# Patient Record
Sex: Female | Born: 1940 | Race: Black or African American | Hispanic: No | Smoking: Never smoker
Health system: Southern US, Community
[De-identification: ages and names within clinical notes are randomized; demographics above are authoritative.]

## PROBLEM LIST (undated history)

## (undated) DIAGNOSIS — E7800 Pure hypercholesterolemia, unspecified: Secondary | ICD-10-CM

## (undated) DIAGNOSIS — E119 Type 2 diabetes mellitus without complications: Secondary | ICD-10-CM

## (undated) DIAGNOSIS — R0602 Shortness of breath: Secondary | ICD-10-CM

## (undated) DIAGNOSIS — I10 Essential (primary) hypertension: Secondary | ICD-10-CM

## (undated) DIAGNOSIS — M1990 Unspecified osteoarthritis, unspecified site: Secondary | ICD-10-CM

## (undated) DIAGNOSIS — H269 Unspecified cataract: Secondary | ICD-10-CM

## (undated) DIAGNOSIS — Z87442 Personal history of urinary calculi: Secondary | ICD-10-CM

## (undated) DIAGNOSIS — M67919 Unspecified disorder of synovium and tendon, unspecified shoulder: Secondary | ICD-10-CM

## (undated) DIAGNOSIS — K219 Gastro-esophageal reflux disease without esophagitis: Secondary | ICD-10-CM

## (undated) DIAGNOSIS — H409 Unspecified glaucoma: Secondary | ICD-10-CM

## (undated) DIAGNOSIS — R2243 Localized swelling, mass and lump, lower limb, bilateral: Secondary | ICD-10-CM

## (undated) DIAGNOSIS — R42 Dizziness and giddiness: Secondary | ICD-10-CM

## (undated) DIAGNOSIS — E039 Hypothyroidism, unspecified: Secondary | ICD-10-CM

## (undated) DIAGNOSIS — R079 Chest pain, unspecified: Secondary | ICD-10-CM

## (undated) DIAGNOSIS — K589 Irritable bowel syndrome without diarrhea: Secondary | ICD-10-CM

## (undated) DIAGNOSIS — R609 Edema, unspecified: Secondary | ICD-10-CM

## (undated) HISTORY — PX: JOINT REPLACEMENT: SHX530

## (undated) HISTORY — DX: Type 2 diabetes mellitus without complications: E11.9

## (undated) HISTORY — DX: Unspecified cataract: H26.9

## (undated) HISTORY — DX: Shortness of breath: R06.02

## (undated) HISTORY — DX: Unspecified glaucoma: H40.9

## (undated) HISTORY — DX: Chest pain, unspecified: R07.9

---

## 1973-07-09 HISTORY — PX: HEMORRHOID SURGERY: SHX153

## 1973-07-09 HISTORY — PX: CHOLECYSTECTOMY: SHX55

## 1983-07-10 HISTORY — PX: BREAST SURGERY: SHX581

## 1983-07-10 HISTORY — PX: ABDOMINAL HYSTERECTOMY: SHX81

## 1993-07-09 HISTORY — PX: EYE SURGERY: SHX253

## 1999-07-07 ENCOUNTER — Ambulatory Visit (HOSPITAL_BASED_OUTPATIENT_CLINIC_OR_DEPARTMENT_OTHER): Admission: RE | Admit: 1999-07-07 | Discharge: 1999-07-07 | Payer: Self-pay | Admitting: Urology

## 1999-07-20 ENCOUNTER — Ambulatory Visit (HOSPITAL_COMMUNITY): Admission: RE | Admit: 1999-07-20 | Discharge: 1999-07-20 | Payer: Self-pay | Admitting: *Deleted

## 1999-07-20 ENCOUNTER — Encounter: Payer: Self-pay | Admitting: *Deleted

## 1999-10-27 ENCOUNTER — Encounter (INDEPENDENT_AMBULATORY_CARE_PROVIDER_SITE_OTHER): Payer: Self-pay | Admitting: Specialist

## 1999-10-27 ENCOUNTER — Ambulatory Visit (HOSPITAL_COMMUNITY): Admission: RE | Admit: 1999-10-27 | Discharge: 1999-10-27 | Payer: Self-pay | Admitting: *Deleted

## 2000-01-26 ENCOUNTER — Ambulatory Visit (HOSPITAL_COMMUNITY): Admission: RE | Admit: 2000-01-26 | Discharge: 2000-01-26 | Payer: Self-pay | Admitting: *Deleted

## 2000-01-31 ENCOUNTER — Other Ambulatory Visit: Admission: RE | Admit: 2000-01-31 | Discharge: 2000-01-31 | Payer: Self-pay | Admitting: Obstetrics

## 2000-12-20 ENCOUNTER — Encounter: Admission: RE | Admit: 2000-12-20 | Discharge: 2000-12-20 | Payer: Self-pay | Admitting: *Deleted

## 2000-12-20 ENCOUNTER — Encounter: Payer: Self-pay | Admitting: *Deleted

## 2002-07-24 ENCOUNTER — Encounter: Payer: Self-pay | Admitting: Ophthalmology

## 2002-07-30 ENCOUNTER — Ambulatory Visit (HOSPITAL_COMMUNITY): Admission: RE | Admit: 2002-07-30 | Discharge: 2002-07-30 | Payer: Self-pay | Admitting: Ophthalmology

## 2003-09-10 ENCOUNTER — Ambulatory Visit (HOSPITAL_COMMUNITY): Admission: RE | Admit: 2003-09-10 | Discharge: 2003-09-10 | Payer: Self-pay | Admitting: Internal Medicine

## 2003-09-17 ENCOUNTER — Ambulatory Visit (HOSPITAL_COMMUNITY): Admission: RE | Admit: 2003-09-17 | Discharge: 2003-09-17 | Payer: Self-pay | Admitting: Ophthalmology

## 2003-10-22 ENCOUNTER — Ambulatory Visit (HOSPITAL_COMMUNITY): Admission: RE | Admit: 2003-10-22 | Discharge: 2003-10-22 | Payer: Self-pay | Admitting: Internal Medicine

## 2003-11-08 ENCOUNTER — Ambulatory Visit (HOSPITAL_COMMUNITY): Admission: RE | Admit: 2003-11-08 | Discharge: 2003-11-08 | Payer: Self-pay | Admitting: Internal Medicine

## 2003-12-09 ENCOUNTER — Ambulatory Visit (HOSPITAL_COMMUNITY): Admission: RE | Admit: 2003-12-09 | Discharge: 2003-12-09 | Payer: Self-pay | Admitting: Internal Medicine

## 2004-11-09 ENCOUNTER — Encounter: Admission: RE | Admit: 2004-11-09 | Discharge: 2004-11-22 | Payer: Self-pay | Admitting: Internal Medicine

## 2005-08-16 ENCOUNTER — Ambulatory Visit (HOSPITAL_COMMUNITY): Admission: RE | Admit: 2005-08-16 | Discharge: 2005-08-16 | Payer: Self-pay | Admitting: Ophthalmology

## 2007-11-21 ENCOUNTER — Ambulatory Visit (HOSPITAL_COMMUNITY): Admission: RE | Admit: 2007-11-21 | Discharge: 2007-11-21 | Payer: Self-pay | Admitting: Internal Medicine

## 2009-02-17 ENCOUNTER — Encounter: Admission: RE | Admit: 2009-02-17 | Discharge: 2009-02-17 | Payer: Self-pay | Admitting: Specialist

## 2009-02-20 ENCOUNTER — Encounter: Admission: RE | Admit: 2009-02-20 | Discharge: 2009-02-20 | Payer: Self-pay | Admitting: Specialist

## 2010-07-30 ENCOUNTER — Encounter: Payer: Self-pay | Admitting: Internal Medicine

## 2010-11-24 NOTE — Op Note (Signed)
[name], [name]                  ACCOUNT NO.:  [id number]   MEDICAL RECORD NO.:  [id number]          PATIENT TYPE:  AMB   LOCATION:  SDS                          FACILITY:  [facility]   PHYSICIAN:  Salley Scarlet., M.D.DATE OF BIRTH:  [birth year]   DATE OF PROCEDURE:  08/16/2005  DATE OF DISCHARGE:  08/16/2005                                 OPERATIVE REPORT   PREOPERATIVE DIAGNOSIS:  Immature cataract, left eye.   POSTOPERATIVE DIAGNOSIS:  Immature cataract, left eye.   OPERATION PERFORMED:  Kelman phacoemulsification cataract, left eye with  intraocular lens implantation.   SURGEON:  Nadyne Coombes, M.D.   ANESTHESIA:  Local using Xylocaine 2% and Marcaine 0.75% and Wydase.   INDICATIONS FOR PROCEDURE:  The patient is a 70 year old lady who underwent  cataract extraction from the right eye several months ago.  She still  complains of blurring of vision with difficulty seeing to do work.  She was  evaluated and found to have a visual acuity best corrected at 20/70 on the  left, 20/25 on the right.  There was intraocular lens present in the right  eye but there was a 2+ nuclear sclerotic cataract on the left.  Cataract  extraction with intraocular lens implantation was recommended.  She was  admitted at this time for that purpose.   DESCRIPTION OF PROCEDURE:  Under the influence of intravenous sedation and  Van Lint akinesia, retrobulbar anesthesia was given.  The patient was  prepped and draped in the usual manner.  A lid speculum was inserted under  the upper and lower lid of the left eye and a 4-0 silk traction suture was  passed through the belly of the superior rectus muscle for traction.  A  fornix-based conjunctival flap was turned and hemostasis achieved using  cautery.  An incision was made in the sclera at the limbus.  This incision  was dissected down to clear cornea using crescent blade.  A side port  incision made at the 1:30 o'clock position.  The anterior  chamber was then  entered through the corneoscleral tunnel incision at the 11:30 o'clock  position.  Ocucoat was injected into the eye through the incision.  An  anterior capsulotomy was done using a bent 25 gauge needle.  The nucleus was  hydrodissected using Xylocaine.  The KPE handpiece was passed into the eye  and the nucleus was begun to be emulsified.  During the procedure the pupil  became quite miotic and it was subsequently noted that a tear was obtained  in the posterior capsule.  The eye was then opened by extending the  corneoscleral wound first toward the left, then toward the right, placing a  single 8-0 Vicryl suture across each arm of the incision as it was made  toward the left then toward the right.  The remaining nucleus was then  manually expressed from the eye.  An anterior vitrector was then good using  the vitrector.  The residual cortical material was then aspirated using the  IA handpiece.  The anterior chamber was then filled with Ocucoat and  the  pupil was constricted using Miochol.  An anterior chamber lens was then  seated across the iris into the chamber angle without difficulty.  A  peripheral iridectomy was made in the iris.  The corneoscleral wound was  closed using a combination of interrupted sutures of 8-0 Vicryl and 10-0  nylon.  After it was ascertained that the wound was airtight and watertight,  the conjunctiva was closed using thermal cautery.  1 mL of Celestone and 0.5  mL of gentamicin were injected subconjunctivally.  Maxitrol ophthalmic  ointment and atropine ointment were applied along with a patch and Fox  shield. The patient tolerated the procedure well and was discharged to the  post  anesthesia recovery room in satisfactory condition.  The patient was  instructed to rest today, to take Darvocet-N every four hours as needed for  pain and to see me in the office tomorrow for further evaluation.   DISCHARGE DIAGNOSIS:  Immature cataract, left  eye.      Salley Scarlet., M.D.  Electronically Signed     TB/MEDQ  D:  08/16/2005  T:  08/17/2005  Job:  [id number]

## 2012-12-02 ENCOUNTER — Other Ambulatory Visit: Payer: Self-pay | Admitting: Orthopedic Surgery

## 2012-12-15 NOTE — Pre-Procedure Instructions (Signed)
Please enter orders in EPIC as patient has pre-op appointment on 12/18/2012 at 11 am! Thank you!

## 2012-12-17 ENCOUNTER — Encounter (HOSPITAL_COMMUNITY): Payer: Self-pay | Admitting: Pharmacy Technician

## 2012-12-17 ENCOUNTER — Other Ambulatory Visit: Payer: Self-pay | Admitting: Orthopedic Surgery

## 2012-12-17 MED ORDER — DEXAMETHASONE SODIUM PHOSPHATE 10 MG/ML IJ SOLN: 10.0000 mg | Freq: Once | INTRAMUSCULAR | Status: DC

## 2012-12-17 MED ORDER — BUPIVACAINE LIPOSOME 1.3 % IJ SUSP: 20.0000 mL | Freq: Once | INTRAMUSCULAR | Status: DC

## 2012-12-17 NOTE — Progress Notes (Signed)
Preoperative surgical orders have been place into the Epic hospital system for [name] on 12/17/2012, 10:11 PM  by Patrica Duel for surgery on 12/29/2012.  Preop Total Knee orders including Experal, IV Tylenol, and IV Decadron as long as there are no contraindications to the above medications. Avel Peace, PA-C

## 2012-12-18 ENCOUNTER — Ambulatory Visit (HOSPITAL_COMMUNITY)
Admission: RE | Admit: 2012-12-18 | Discharge: 2012-12-18 | Disposition: A | Discharge status: Home / Self Care | Payer: Medicare Other | Source: Ambulatory Visit | Attending: Orthopedic Surgery | Admitting: Orthopedic Surgery

## 2012-12-18 ENCOUNTER — Encounter (HOSPITAL_COMMUNITY)
Admission: RE | Admit: 2012-12-18 | Discharge: 2012-12-18 | Disposition: A | Discharge status: Home / Self Care | Payer: Medicare Other | Source: Ambulatory Visit | Attending: Orthopedic Surgery | Admitting: Orthopedic Surgery

## 2012-12-18 ENCOUNTER — Encounter (HOSPITAL_COMMUNITY): Payer: Self-pay

## 2012-12-18 DIAGNOSIS — Z01818 Encounter for other preprocedural examination: Secondary | ICD-10-CM | POA: Insufficient documentation

## 2012-12-18 DIAGNOSIS — Z01812 Encounter for preprocedural laboratory examination: Secondary | ICD-10-CM | POA: Insufficient documentation

## 2012-12-18 DIAGNOSIS — I10 Essential (primary) hypertension: Secondary | ICD-10-CM | POA: Insufficient documentation

## 2012-12-18 HISTORY — DX: Essential (primary) hypertension: I10

## 2012-12-18 HISTORY — DX: Unspecified osteoarthritis, unspecified site: M19.90

## 2012-12-18 HISTORY — DX: Dizziness and giddiness: R42

## 2012-12-18 HISTORY — DX: Hypothyroidism, unspecified: E03.9

## 2012-12-18 HISTORY — DX: Gastro-esophageal reflux disease without esophagitis: K21.9

## 2012-12-18 HISTORY — DX: Pure hypercholesterolemia, unspecified: E78.00

## 2012-12-18 HISTORY — DX: Edema, unspecified: R60.9

## 2012-12-18 HISTORY — DX: Irritable bowel syndrome, unspecified: K58.9

## 2012-12-18 HISTORY — DX: Unspecified disorder of synovium and tendon, unspecified shoulder: M67.919

## 2012-12-18 LAB — COMPREHENSIVE METABOLIC PANEL
ALT: 22 U/L (ref 0–35)
AST: 19 U/L (ref 0–37)
Calcium: 10.9 mg/dL — ABNORMAL HIGH (ref 8.4–10.5)
Creatinine, Ser: 0.61 mg/dL (ref 0.50–1.10)
GFR calc Af Amer: >90 mL/min (ref >90)
Sodium: 136 mEq/L (ref 135–145)
Total Protein: 6.4 g/dL (ref 6.0–8.3)

## 2012-12-18 LAB — PROTIME-INR: INR: 0.93 (ref 0.00–1.49)

## 2012-12-18 LAB — CBC
MCH: 30 pg (ref 26.0–34.0)
MCHC: 34.1 g/dL (ref 30.0–36.0)
Platelets: 170 10*3/uL (ref 150–400)

## 2012-12-18 LAB — URINALYSIS, ROUTINE W REFLEX MICROSCOPIC
Glucose, UA: NEGATIVE mg/dL
Leukocytes, UA: NEGATIVE
Nitrite: NEGATIVE
Protein, ur: NEGATIVE mg/dL
pH: 5.5 (ref 5.0–8.0)

## 2012-12-18 LAB — SURGICAL PCR SCREEN: Staphylococcus aureus: NEGATIVE

## 2012-12-18 NOTE — Pre-Procedure Instructions (Signed)
EKG REPORT 06/26/12 ON PT'S CHART FROM DR. PChestine Spore AND NOTE OF MEDICAL CLEARANCE. CXR WAS DONE TODAY AT [facility].

## 2012-12-18 NOTE — Patient Instructions (Signed)
YOUR SURGERY IS SCHEDULED AT [facility]  ON:  Monday  June 23  REPORT TO [place] SHORT STAY CENTER AT:  6:25 AM      PHONE # FOR SHORT STAY IS [phone]  DO NOT EAT OR DRINK ANYTHING AFTER MIDNIGHT THE NIGHT BEFORE YOUR SURGERY.  YOU MAY BRUSH YOUR TEETH, RINSE OUT YOUR MOUTH--BUT NO WATER, NO FOOD, NO CHEWING GUM, NO MINTS, NO CANDIES, NO CHEWING TOBACCO.  PLEASE TAKE THE FOLLOWING MEDICATIONS THE AM OF YOUR SURGERY WITH A FEW SIPS OF WATER:    SYNTHROID, PROTONIX   DO NOT BRING VALUABLES, MONEY, CREDIT CARDS.  DO NOT WEAR JEWELRY, MAKE-UP, NAIL POLISH AND NO METAL PINS OR CLIPS IN YOUR HAIR. CONTACT LENS, DENTURES / PARTIALS, GLASSES SHOULD NOT BE WORN TO SURGERY AND IN MOST CASES-HEARING AIDS WILL NEED TO BE REMOVED.  BRING YOUR GLASSES CASE, ANY EQUIPMENT NEEDED FOR YOUR CONTACT LENS. FOR PATIENTS ADMITTED TO THE HOSPITAL--CHECK OUT TIME THE DAY OF DISCHARGE IS 11:00 AM.  ALL INPATIENT ROOMS ARE PRIVATE - WITH BATHROOM, TELEPHONE, TELEVISION AND WIFI INTERNET.                                 PLEASE READ OVER ANY  FACT SHEETS THAT YOU WERE GIVEN: MRSA INFORMATION, BLOOD TRANSFUSION INFORMATION, INCENTIVE SPIROMETER INFORMATION. FAILURE TO FOLLOW THESE INSTRUCTIONS MAY RESULT IN THE CANCELLATION OF YOUR SURGERY.   PATIENT SIGNATURE_________________________________

## 2012-12-28 ENCOUNTER — Other Ambulatory Visit: Payer: Self-pay | Admitting: Orthopedic Surgery

## 2012-12-28 NOTE — H&P (Signed)
[name]  DOB: 05/24/1941 Married / Language: Undefined / Race: Black or African American Female  Date of Admission:  12/29/2012  Chief Complaint:  Right Knee Pain  History of Present Illness The patient is a 71 year old female who comes in for a preoperative History and Physical. The patient is scheduled for a right total knee arthroplasty to be performed by Dr. Frank V. Aluisio, MD at [facility] on 12/29/2012. The patient is a 71 year old female who presents for follow up of their knee. The patient is being followed for their right knee pain and osteoarthritis. They are now out from Synvisc series. Symptoms reported today include: stiffness, pain with weightbearing, difficulty ambulating and difficulty arising from chair. The patient feels that they are doing poorly and report their pain level to be moderate. The patient has not gotten any relief of their symptoms with viscosupplementation. Unfortunately, the knee is getting progressively worse over time. It hurts her at all times. It is limiting what she can and cannot do. The Synvisc has not helped anymore. She feels like the knee is essentially taking over her life and she is at a stage where she feels like she needs to do something. She is not having associated hip pain with this. She is ready to get the knee fixed at this time. They have been treated conservatively in the past for the above stated problem and despite conservative measures, they continue to have progressive pain and severe functional limitations and dysfunction. They have failed non-operative management including home exercise, medications, and injections. It is felt that they would benefit from undergoing total joint replacement. Risks and benefits of the procedure have been discussed with the patient and they elect to proceed with surgery. There are no active contraindications to surgery such as ongoing infection or rapidly progressive neurological  disease.     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 Rash. Codeine/Codeine Derivatives. Nausea.   Family History  Cancer. sister and brother Diabetes Mellitus. mother and sister Hypertension. mother   Social History Exercise. Exercises daily; does other Illicit drug use. no Living situation. live alone Drug/Alcohol Rehab (Previously). no Children. 4 Current work status. retired Drug/Alcohol Rehab (Currently). no Marital status. married Tobacco use. Former smoker. never smoker Number of flights of stairs before winded. 2-3 Pain Contract. no Tobacco / smoke exposure. yes Alcohol use. Occasional alcohol use. current drinker; drinks wine and hard liquor; only occasionally per week   Medication History  Synthroid (75MCG Tablet, Oral) Active. Furosemide (20MG Tablet, Oral) Active. Crestor (10MG Tablet, Oral) Active. Benicar ( Oral) Specific dose unknown - Active. Protonix (40MG Tablet DR, Oral) Active. Centrum Silver ( Oral) Active. Aspirin Adult Low Strength (81MG Tablet DR, Oral) Active. MiraLax ( Oral) Active. Meclizine HCl (25MG Tablet, Oral) Active.   Past Surgical History  Hysterectomy. partial (non-cancerous) Tonsillectomy Hemorrhoidectomy Breast Biopsy. right Cataract Surgery. bilateral Gallbladder Surgery. open    Medical History Hypothyroidism Irritable bowel syndrome Osteoarthritis Hypercholesterolemia Gastroesophageal Reflux Disease High blood pressure Vertigo Lymphadema. right arm   Review of Systems  General:Not Present- Chills, Fever, Night Sweats, Fatigue, Weight Gain, Weight Loss and Memory Loss. Skin:Not Present- Hives, Itching, Rash, Eczema and Lesions. HEENT:Not Present- Tinnitus, Headache, Double Vision, Visual Loss, Hearing Loss and Dentures. Respiratory:Not Present- Shortness of breath with exertion, Shortness of breath at  rest, Allergies, Coughing up blood and Chronic Cough. Cardiovascular:Not Present- Chest Pain, Racing/skipping heartbeats, Difficulty Breathing Lying Down,   Murmur, Swelling and Palpitations. Gastrointestinal:Not Present- Bloody Stool, Heartburn, Abdominal Pain, Vomiting, Nausea, Constipation, Diarrhea, Difficulty Swallowing, Jaundice and Loss of appetitie. Female Genitourinary:Not Present- Blood in Urine, Urinary frequency, Weak urinary stream, Discharge, Flank Pain, Incontinence, Painful Urination, Urgency, Urinary Retention and Urinating at Night. Musculoskeletal:Not Present- Muscle Weakness, Muscle Pain, Joint Swelling, Joint Pain, Back Pain, Morning Stiffness and Spasms. Neurological:Not Present- Tremor, Dizziness, Blackout spells, Paralysis, Difficulty with balance and Weakness. Psychiatric:Not Present- Insomnia.   Vitals  Pulse: 64 (Regular) Resp.: 14 (Unlabored) BP: 128/62 (Sitting, Right Arm, Standard)    Physical Exam  The physical exam findings are as follows:   General Mental Status - Alert, cooperative and good historian. General Appearance- pleasant. Not in acute distress. Orientation- Oriented X3. Build & Nutrition- Well nourished and Well developed.   Head and Neck Head- normocephalic, atraumatic . Neck Global Assessment- supple. no bruit auscultated on the right and no bruit auscultated on the left.   Eye Pupil- Bilateral- Regular and Round. Motion- Bilateral- EOMI.   Chest and Lung Exam Auscultation: Breath sounds:- clear at anterior chest wall and - clear at posterior chest wall. Adventitious sounds:- No Adventitious sounds.   Cardiovascular Auscultation:Rhythm- Regular rate and rhythm. Heart Sounds- S1 WNL and S2 WNL. Murmurs & Other Heart Sounds:Auscultation of the heart reveals - No Murmurs.   Abdomen Inspection:Contour- Generalized mild distention. Palpation/Percussion:Tenderness- Abdomen is non-tender to  palpation. Rigidity (guarding)- Abdomen is soft. Auscultation:Auscultation of the abdomen reveals - Bowel sounds normal.   Female Genitourinary  Not done, not pertinent to present illness  Musculoskeletal  On exam well developed female, alert and oriented in no apparent distress. Her hip shows normal range of motion with no discomfort. Her left knee, no effusion, range about 5 to 125. Slight crepitus on range of motion. No tenderness or instability. The right knee no effusion. Range about 10 to 120. Marked crepitus on range of motion. Tender medial greater than lateral with no instability noted. Pulse sensation motor intact.  RADIOGRAPHS: She is bone on bone medial and patellofemoral.  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 Aluisio.  Plan is to go to rehab at [facility] at [place].  Signed electronically by Gilmore List L Coti Burd, III PA-C  

## 2012-12-29 ENCOUNTER — Encounter (HOSPITAL_COMMUNITY)
Admission: RE | Disposition: A | Discharge status: Skilled Nursing Facility | Payer: Self-pay | Source: Ambulatory Visit | Attending: Orthopedic Surgery

## 2012-12-29 ENCOUNTER — Encounter (HOSPITAL_COMMUNITY): Payer: Self-pay | Admitting: Anesthesiology

## 2012-12-29 ENCOUNTER — Inpatient Hospital Stay (HOSPITAL_COMMUNITY)
Admission: RE | Admit: 2012-12-29 | Discharge: 2013-01-01 | DRG: 470 | Disposition: A | Discharge status: Skilled Nursing Facility | Payer: Medicare Other | Source: Ambulatory Visit | Attending: Orthopedic Surgery | Admitting: Orthopedic Surgery

## 2012-12-29 ENCOUNTER — Encounter (HOSPITAL_COMMUNITY): Payer: Self-pay | Admitting: *Deleted

## 2012-12-29 ENCOUNTER — Inpatient Hospital Stay (HOSPITAL_COMMUNITY): Payer: Medicare Other | Admitting: Anesthesiology

## 2012-12-29 DIAGNOSIS — E039 Hypothyroidism, unspecified: Secondary | ICD-10-CM | POA: Diagnosis present

## 2012-12-29 DIAGNOSIS — D62 Acute posthemorrhagic anemia: Secondary | ICD-10-CM

## 2012-12-29 DIAGNOSIS — E7800 Pure hypercholesterolemia, unspecified: Secondary | ICD-10-CM | POA: Diagnosis present

## 2012-12-29 DIAGNOSIS — I10 Essential (primary) hypertension: Secondary | ICD-10-CM | POA: Diagnosis present

## 2012-12-29 DIAGNOSIS — Z6838 Body mass index (BMI) 38.0-38.9, adult: Secondary | ICD-10-CM

## 2012-12-29 DIAGNOSIS — M1710 Unilateral primary osteoarthritis, unspecified knee: Principal | ICD-10-CM | POA: Diagnosis present

## 2012-12-29 DIAGNOSIS — M179 Osteoarthritis of knee, unspecified: Secondary | ICD-10-CM | POA: Diagnosis present

## 2012-12-29 DIAGNOSIS — Z96651 Presence of right artificial knee joint: Secondary | ICD-10-CM

## 2012-12-29 DIAGNOSIS — K219 Gastro-esophageal reflux disease without esophagitis: Secondary | ICD-10-CM | POA: Diagnosis present

## 2012-12-29 HISTORY — PX: TOTAL KNEE ARTHROPLASTY: SHX125

## 2012-12-29 LAB — ABO/RH: ABO/RH(D): B POS

## 2012-12-29 LAB — TYPE AND SCREEN: ABO/RH(D): B POS

## 2012-12-29 SURGERY — ARTHROPLASTY, KNEE, TOTAL
Anesthesia: Spinal | Site: Knee | Laterality: Right | Wound class: Clean

## 2012-12-29 MED ORDER — PROMETHAZINE HCL 25 MG/ML IJ SOLN: 6.2500 mg | INTRAMUSCULAR | Status: DC | PRN

## 2012-12-29 MED ORDER — MENTHOL 3 MG MT LOZG
1.0000 | LOZENGE | OROMUCOSAL | Status: DC | PRN
  Filled 2012-12-29: qty 9

## 2012-12-29 MED ORDER — KETOROLAC TROMETHAMINE 15 MG/ML IJ SOLN
7.5000 mg | Freq: Four times a day (QID) | INTRAMUSCULAR | Status: AC | PRN
  Administered 2012-12-29: 7.5 mg via INTRAVENOUS
  Filled 2012-12-29: qty 1

## 2012-12-29 MED ORDER — CEFAZOLIN SODIUM-DEXTROSE 2-3 GM-% IV SOLR
2.0000 g | INTRAVENOUS | Status: AC
  Administered 2012-12-29: 2 g via INTRAVENOUS

## 2012-12-29 MED ORDER — LACTATED RINGERS IV SOLN
INTRAVENOUS | Status: DC
  Administered 2012-12-29: 1000 mL via INTRAVENOUS
  Administered 2012-12-29: 10:00:00 via INTRAVENOUS

## 2012-12-29 MED ORDER — SODIUM CHLORIDE 0.9 % IJ SOLN
INTRAMUSCULAR | Status: DC | PRN
  Administered 2012-12-29: 30 mL

## 2012-12-29 MED ORDER — EPHEDRINE SULFATE 50 MG/ML IJ SOLN
INTRAMUSCULAR | Status: DC | PRN
  Administered 2012-12-29: 5 mg via INTRAVENOUS

## 2012-12-29 MED ORDER — METHOCARBAMOL 100 MG/ML IJ SOLN: 500.0000 mg | Freq: Four times a day (QID) | INTRAVENOUS | Status: DC | PRN

## 2012-12-29 MED ORDER — PROPOFOL INFUSION 10 MG/ML OPTIME
INTRAVENOUS | Status: DC | PRN
  Administered 2012-12-29: 75 ug/kg/min via INTRAVENOUS

## 2012-12-29 MED ORDER — BISACODYL 10 MG RE SUPP: 10.0000 mg | Freq: Every day | RECTAL | Status: DC | PRN

## 2012-12-29 MED ORDER — BUPIVACAINE LIPOSOME 1.3 % IJ SUSP
20.0000 mL | Freq: Once | INTRAMUSCULAR | Status: DC
  Filled 2012-12-29: qty 20

## 2012-12-29 MED ORDER — RIVAROXABAN 10 MG PO TABS
10.0000 mg | ORAL_TABLET | Freq: Every day | ORAL | Status: DC
  Administered 2012-12-30 – 2013-01-01 (×3): 10 mg via ORAL
  Filled 2012-12-29 (×4): qty 1

## 2012-12-29 MED ORDER — BUPIVACAINE LIPOSOME 1.3 % IJ SUSP
INTRAMUSCULAR | Status: DC | PRN
  Administered 2012-12-29: 20 mL

## 2012-12-29 MED ORDER — FENTANYL CITRATE 0.05 MG/ML IJ SOLN
INTRAMUSCULAR | Status: DC | PRN
  Administered 2012-12-29: 100 ug via INTRAVENOUS

## 2012-12-29 MED ORDER — DEXAMETHASONE 6 MG PO TABS
10.0000 mg | ORAL_TABLET | Freq: Every day | ORAL | Status: AC
  Administered 2012-12-30: 10 mg via ORAL
  Filled 2012-12-29: qty 1

## 2012-12-29 MED ORDER — ACETAMINOPHEN 500 MG PO TABS
1000.0000 mg | ORAL_TABLET | Freq: Four times a day (QID) | ORAL | Status: AC
  Administered 2012-12-29 – 2012-12-30 (×4): 1000 mg via ORAL
  Filled 2012-12-29 (×8): qty 2

## 2012-12-29 MED ORDER — ATORVASTATIN CALCIUM 20 MG PO TABS
20.0000 mg | ORAL_TABLET | Freq: Every day | ORAL | Status: DC
  Administered 2012-12-29 – 2012-12-31 (×3): 20 mg via ORAL
  Filled 2012-12-29 (×4): qty 1

## 2012-12-29 MED ORDER — FENTANYL CITRATE 0.05 MG/ML IJ SOLN: 25.0000 ug | INTRAMUSCULAR | Status: DC | PRN

## 2012-12-29 MED ORDER — POLYETHYLENE GLYCOL 3350 17 G PO PACK: 17.0000 g | PACK | Freq: Every day | ORAL | Status: DC | PRN

## 2012-12-29 MED ORDER — ONDANSETRON HCL 4 MG/2ML IJ SOLN
4.0000 mg | Freq: Four times a day (QID) | INTRAMUSCULAR | Status: DC | PRN
  Administered 2012-12-29 – 2012-12-31 (×4): 4 mg via INTRAVENOUS
  Filled 2012-12-29 (×4): qty 2

## 2012-12-29 MED ORDER — CEFAZOLIN SODIUM 1-5 GM-% IV SOLN
1.0000 g | Freq: Four times a day (QID) | INTRAVENOUS | Status: AC
  Administered 2012-12-29 (×2): 1 g via INTRAVENOUS
  Filled 2012-12-29 (×2): qty 50

## 2012-12-29 MED ORDER — DIPHENHYDRAMINE HCL 12.5 MG/5ML PO ELIX: 12.5000 mg | ORAL_SOLUTION | ORAL | Status: DC | PRN

## 2012-12-29 MED ORDER — ACETAMINOPHEN 500 MG PO TABS
1000.0000 mg | ORAL_TABLET | Freq: Once | ORAL | Status: AC
  Administered 2012-12-29: 1000 mg via ORAL
  Filled 2012-12-29: qty 2

## 2012-12-29 MED ORDER — METOCLOPRAMIDE HCL 10 MG PO TABS
5.0000 mg | ORAL_TABLET | Freq: Three times a day (TID) | ORAL | Status: DC | PRN
  Administered 2012-12-31: 10 mg via ORAL
  Filled 2012-12-29: qty 1

## 2012-12-29 MED ORDER — METHOCARBAMOL 500 MG PO TABS
500.0000 mg | ORAL_TABLET | Freq: Four times a day (QID) | ORAL | Status: DC | PRN
  Administered 2012-12-29 – 2013-01-01 (×4): 500 mg via ORAL
  Filled 2012-12-29 (×4): qty 1

## 2012-12-29 MED ORDER — DOCUSATE SODIUM 100 MG PO CAPS
100.0000 mg | ORAL_CAPSULE | Freq: Two times a day (BID) | ORAL | Status: DC
  Administered 2012-12-29 – 2013-01-01 (×6): 100 mg via ORAL

## 2012-12-29 MED ORDER — FLEET ENEMA 7-19 GM/118ML RE ENEM: 1.0000 | ENEMA | Freq: Once | RECTAL | Status: AC | PRN

## 2012-12-29 MED ORDER — LIDOCAINE HCL (CARDIAC) 20 MG/ML IV SOLN
INTRAVENOUS | Status: DC | PRN
  Administered 2012-12-29: 50 mg via INTRAVENOUS

## 2012-12-29 MED ORDER — IRBESARTAN 150 MG PO TABS
150.0000 mg | ORAL_TABLET | Freq: Every day | ORAL | Status: DC
  Administered 2012-12-30 – 2013-01-01 (×3): 150 mg via ORAL
  Filled 2012-12-29 (×4): qty 1

## 2012-12-29 MED ORDER — ONDANSETRON HCL 4 MG PO TABS
4.0000 mg | ORAL_TABLET | Freq: Four times a day (QID) | ORAL | Status: DC | PRN
  Administered 2012-12-31 – 2013-01-01 (×2): 4 mg via ORAL
  Filled 2012-12-29 (×2): qty 1

## 2012-12-29 MED ORDER — LEVOTHYROXINE SODIUM 75 MCG PO TABS
75.0000 ug | ORAL_TABLET | ORAL | Status: DC
  Administered 2012-12-30 – 2013-01-01 (×3): 75 ug via ORAL
  Filled 2012-12-29 (×5): qty 1

## 2012-12-29 MED ORDER — PANTOPRAZOLE SODIUM 40 MG PO TBEC
40.0000 mg | DELAYED_RELEASE_TABLET | Freq: Every day | ORAL | Status: DC
  Administered 2012-12-30 – 2013-01-01 (×3): 40 mg via ORAL
  Filled 2012-12-29 (×4): qty 1

## 2012-12-29 MED ORDER — TRANEXAMIC ACID 100 MG/ML IV SOLN
1000.0000 mg | INTRAVENOUS | Status: AC
  Administered 2012-12-29: 1000 mg via INTRAVENOUS
  Filled 2012-12-29: qty 10

## 2012-12-29 MED ORDER — OXYCODONE HCL 5 MG PO TABS
5.0000 mg | ORAL_TABLET | ORAL | Status: DC | PRN
  Administered 2012-12-29 – 2013-01-01 (×12): 10 mg via ORAL
  Filled 2012-12-29 (×12): qty 2

## 2012-12-29 MED ORDER — MECLIZINE HCL 25 MG PO TABS
25.0000 mg | ORAL_TABLET | Freq: Four times a day (QID) | ORAL | Status: DC | PRN
  Filled 2012-12-29: qty 1

## 2012-12-29 MED ORDER — DEXAMETHASONE SODIUM PHOSPHATE 10 MG/ML IJ SOLN
10.0000 mg | Freq: Every day | INTRAMUSCULAR | Status: AC
  Filled 2012-12-29: qty 1

## 2012-12-29 MED ORDER — POLYETHYLENE GLYCOL 3350 17 G PO PACK
17.0000 g | PACK | Freq: Every day | ORAL | Status: DC
  Administered 2012-12-30 – 2013-01-01 (×3): 17 g via ORAL

## 2012-12-29 MED ORDER — SODIUM CHLORIDE 0.9 % IV SOLN: INTRAVENOUS | Status: DC

## 2012-12-29 MED ORDER — METOCLOPRAMIDE HCL 5 MG/ML IJ SOLN
5.0000 mg | Freq: Three times a day (TID) | INTRAMUSCULAR | Status: DC | PRN
  Administered 2012-12-29 – 2012-12-30 (×3): 10 mg via INTRAVENOUS
  Filled 2012-12-29 (×4): qty 2

## 2012-12-29 MED ORDER — SODIUM CHLORIDE 0.9 % IR SOLN
Status: DC | PRN
  Administered 2012-12-29: 1000 mL

## 2012-12-29 MED ORDER — BUPIVACAINE HCL 0.25 % IJ SOLN
INTRAMUSCULAR | Status: DC | PRN
  Administered 2012-12-29: 20 mL

## 2012-12-29 MED ORDER — 0.9 % SODIUM CHLORIDE (POUR BTL) OPTIME
TOPICAL | Status: DC | PRN
  Administered 2012-12-29: 1000 mL

## 2012-12-29 MED ORDER — MIDAZOLAM HCL 5 MG/5ML IJ SOLN
INTRAMUSCULAR | Status: DC | PRN
  Administered 2012-12-29: 2 mg via INTRAVENOUS

## 2012-12-29 MED ORDER — SODIUM CHLORIDE 0.9 % IV SOLN
INTRAVENOUS | Status: DC
  Administered 2012-12-29 – 2012-12-30 (×2): via INTRAVENOUS

## 2012-12-29 MED ORDER — PHENOL 1.4 % MT LIQD: 1.0000 | OROMUCOSAL | Status: DC | PRN

## 2012-12-29 MED ORDER — ONDANSETRON HCL 4 MG/2ML IJ SOLN
INTRAMUSCULAR | Status: DC | PRN
  Administered 2012-12-29: 4 mg via INTRAVENOUS

## 2012-12-29 MED ORDER — FUROSEMIDE 20 MG PO TABS
20.0000 mg | ORAL_TABLET | Freq: Every morning | ORAL | Status: DC
  Administered 2012-12-30 – 2013-01-01 (×3): 20 mg via ORAL
  Filled 2012-12-29 (×4): qty 1

## 2012-12-29 MED ORDER — CHLORHEXIDINE GLUCONATE 4 % EX LIQD
60.0000 mL | Freq: Once | CUTANEOUS | Status: DC
  Filled 2012-12-29: qty 60

## 2012-12-29 MED ORDER — MORPHINE SULFATE 2 MG/ML IJ SOLN
1.0000 mg | INTRAMUSCULAR | Status: DC | PRN
  Administered 2012-12-29 (×2): 2 mg via INTRAVENOUS
  Administered 2012-12-29: 1 mg via INTRAVENOUS
  Filled 2012-12-29 (×3): qty 1

## 2012-12-29 MED ORDER — MEPERIDINE HCL 50 MG/ML IJ SOLN: 6.2500 mg | INTRAMUSCULAR | Status: DC | PRN

## 2012-12-29 SURGICAL SUPPLY — 56 items
BAG ZIPLOCK 12X15 (MISCELLANEOUS) ×2 IMPLANT
BANDAGE ELASTIC 6 VELCRO ST LF (GAUZE/BANDAGES/DRESSINGS) ×2 IMPLANT
BANDAGE ESMARK 6X9 LF (GAUZE/BANDAGES/DRESSINGS) ×1 IMPLANT
BLADE SAG 18X100X1.27 (BLADE) ×2 IMPLANT
BLADE SAW SGTL 11.0X1.19X90.0M (BLADE) ×2 IMPLANT
BNDG CMPR 9X6 STRL LF SNTH (GAUZE/BANDAGES/DRESSINGS) ×1
BNDG ESMARK 6X9 LF (GAUZE/BANDAGES/DRESSINGS) ×2
BOWL SMART MIX CTS (DISPOSABLE) ×2 IMPLANT
CAPT RP KNEE ×2 IMPLANT
CEMENT HV SMART SET (Cement) ×4 IMPLANT
CLOTH BEACON ORANGE TIMEOUT ST (SAFETY) ×2 IMPLANT
CUFF TOURN SGL QUICK 34 (TOURNIQUET CUFF) ×2
CUFF TRNQT CYL 34X4X40X1 (TOURNIQUET CUFF) ×1 IMPLANT
DECANTER SPIKE VIAL GLASS SM (MISCELLANEOUS) ×2 IMPLANT
DRAPE EXTREMITY T 121X128X90 (DRAPE) ×2 IMPLANT
DRAPE POUCH INSTRU U-SHP 10X18 (DRAPES) ×2 IMPLANT
DRAPE U-SHAPE 47X51 STRL (DRAPES) ×2 IMPLANT
DRSG ADAPTIC 3X8 NADH LF (GAUZE/BANDAGES/DRESSINGS) ×2 IMPLANT
DRSG PAD ABDOMINAL 8X10 ST (GAUZE/BANDAGES/DRESSINGS) ×2 IMPLANT
DURAPREP 26ML APPLICATOR (WOUND CARE) ×2 IMPLANT
ELECT REM PT RETURN 9FT ADLT (ELECTROSURGICAL) ×2
ELECTRODE REM PT RTRN 9FT ADLT (ELECTROSURGICAL) ×1 IMPLANT
EVACUATOR 1/8 PVC DRAIN (DRAIN) ×2 IMPLANT
FACESHIELD LNG OPTICON STERILE (SAFETY) ×10 IMPLANT
GLOVE BIO SURGEON STRL SZ7.5 (GLOVE) IMPLANT
GLOVE BIO SURGEON STRL SZ8 (GLOVE) ×2 IMPLANT
GLOVE BIOGEL PI IND STRL 8 (GLOVE) ×1 IMPLANT
GLOVE BIOGEL PI INDICATOR 8 (GLOVE) ×1
GLOVE SURG SS PI 6.5 STRL IVOR (GLOVE) IMPLANT
GOWN STRL NON-REIN LRG LVL3 (GOWN DISPOSABLE) ×4 IMPLANT
GOWN STRL REIN XL XLG (GOWN DISPOSABLE) ×4 IMPLANT
HANDPIECE INTERPULSE COAX TIP (DISPOSABLE) ×1
IMMOBILIZER KNEE 20 (SOFTGOODS) ×2
IMMOBILIZER KNEE 20 THIGH 36 (SOFTGOODS) ×1 IMPLANT
KIT BASIN OR (CUSTOM PROCEDURE TRAY) ×2 IMPLANT
MANIFOLD NEPTUNE II (INSTRUMENTS) ×2 IMPLANT
NDL SAFETY ECLIPSE 18X1.5 (NEEDLE) ×2 IMPLANT
NEEDLE HYPO 18GX1.5 SHARP (NEEDLE) ×2
NS IRRIG 1000ML POUR BTL (IV SOLUTION) ×2 IMPLANT
PACK TOTAL JOINT (CUSTOM PROCEDURE TRAY) ×2 IMPLANT
PADDING CAST COTTON 6X4 STRL (CAST SUPPLIES) ×4 IMPLANT
POSITIONER SURGICAL ARM (MISCELLANEOUS) ×2 IMPLANT
SET HNDPC FAN SPRY TIP SCT (DISPOSABLE) ×1 IMPLANT
SPONGE GAUZE 4X4 12PLY (GAUZE/BANDAGES/DRESSINGS) ×2 IMPLANT
STRIP CLOSURE SKIN 1/2X4 (GAUZE/BANDAGES/DRESSINGS) ×4 IMPLANT
SUCTION FRAZIER 12FR DISP (SUCTIONS) ×2 IMPLANT
SUT MNCRL AB 4-0 PS2 18 (SUTURE) ×2 IMPLANT
SUT VIC AB 2-0 CT1 27 (SUTURE) ×3
SUT VIC AB 2-0 CT1 TAPERPNT 27 (SUTURE) ×3 IMPLANT
SUT VLOC 180 0 24IN GS25 (SUTURE) ×2 IMPLANT
SYR 20CC LL (SYRINGE) ×2 IMPLANT
SYR 50ML LL SCALE MARK (SYRINGE) ×2 IMPLANT
TOWEL OR 17X26 10 PK STRL BLUE (TOWEL DISPOSABLE) ×4 IMPLANT
TRAY FOLEY CATH 14FRSI W/METER (CATHETERS) ×2 IMPLANT
WATER STERILE IRR 1500ML POUR (IV SOLUTION) ×4 IMPLANT
WRAP KNEE MAXI GEL POST OP (GAUZE/BANDAGES/DRESSINGS) ×2 IMPLANT

## 2012-12-29 NOTE — Transfer of Care (Signed)
Immediate Anesthesia Transfer of Care Note  Patient: [name]  Procedure(s) Performed: Procedure(s): RIGHT TOTAL KNEE ARTHROPLASTY (Right)  Patient Location: PACU  Anesthesia Type:Spinal  Level of Consciousness: awake, alert , oriented and patient cooperative  Airway & Oxygen Therapy: Patient Spontanous Breathing and Patient connected to face mask oxygen  Post-op Assessment: Report given to PACU RN, Post -op Vital signs reviewed and stable and Patient moving all extremities  Post vital signs: Reviewed and stable  Complications: No apparent anesthesia complications

## 2012-12-29 NOTE — Progress Notes (Signed)
4+ edema noted in both legs left greater than right.

## 2012-12-29 NOTE — H&P (View-Only) (Signed)
[name]. Leavy Cella  DOB: December 16, 1940 Married / Language: Undefined / Race: Black or African American Female  Date of Admission:  12/29/2012  Chief Complaint:  Right Knee Pain  History of Present Illness The patient is a 72 year old female who comes in for a preoperative History and Physical. The patient is scheduled for a right total knee arthroplasty to be performed by Dr. Gus Rankin. Aluisio, MD at [facility] on 12/29/2012. The patient is a 72 year old female who presents for follow up of their knee. The patient is being followed for their right knee pain and osteoarthritis. They are now out from Synvisc series. Symptoms reported today include: stiffness, pain with weightbearing, difficulty ambulating and difficulty arising from chair. The patient feels that they are doing poorly and report their pain level to be moderate. The patient has not gotten any relief of their symptoms with viscosupplementation. Unfortunately, the knee is getting progressively worse over time. It hurts her at all times. It is limiting what she can and cannot do. The Synvisc has not helped anymore. She feels like the knee is essentially taking over her life and she is at a stage where she feels like she needs to do something. She is not having associated hip pain with this. She is ready to get the knee fixed at this time. They have been treated conservatively in the past for the above stated problem and despite conservative measures, they continue to have progressive pain and severe functional limitations and dysfunction. They have failed non-operative management including home exercise, medications, and injections. It is felt that they would benefit from undergoing total joint replacement. Risks and benefits of the procedure have been discussed with the patient and they elect to proceed with surgery. There are no active contraindications to surgery such as ongoing infection or rapidly progressive neurological  disease.     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 Rash. Codeine/Codeine Derivatives. Nausea.   Family History  Cancer. sister and brother Diabetes Mellitus. mother and sister Hypertension. mother   Social History Exercise. Exercises daily; does other Illicit drug use. no Living situation. live alone Drug/Alcohol Rehab (Previously). no Children. 4 Current work status. retired Financial planner (Currently). no Marital status. married Tobacco use. Former smoker. never smoker Number of flights of stairs before winded. 2-3 Pain Contract. no Tobacco / smoke exposure. yes Alcohol use. Occasional alcohol use. current drinker; drinks wine and hard liquor; only occasionally per week   Medication History  Synthroid ( Tablet, Oral) Active. Furosemide (20MG  Tablet, Oral) Active. Crestor (10MG  Tablet, Oral) Active. Benicar ( Oral) Specific dose unknown - Active. Protonix (40MG  Tablet DR, Oral) Active. Centrum Silver ( Oral) Active. Aspirin Adult Low Strength (81MG  Tablet DR, Oral) Active. MiraLax ( Oral) Active. Meclizine HCl (25MG  Tablet, Oral) Active.   Past Surgical History  Hysterectomy. partial (non-cancerous) Tonsillectomy Hemorrhoidectomy Breast Biopsy. right Cataract Surgery. bilateral Gallbladder Surgery. open    Medical History Hypothyroidism Irritable bowel syndrome Osteoarthritis Hypercholesterolemia Gastroesophageal Reflux Disease High blood pressure Vertigo Lymphadema. right arm   Review of Systems  General:Not Present- Chills, Fever, Night Sweats, Fatigue, Weight Gain, Weight Loss and Memory Loss. Skin:Not Present- Hives, Itching, Rash, Eczema and Lesions. HEENT:Not Present- Tinnitus, Headache, Double Vision, Visual Loss, Hearing Loss and Dentures. Respiratory:Not Present- Shortness of breath with exertion, Shortness of breath at  rest, Allergies, Coughing up blood and Chronic Cough. Cardiovascular:Not Present- Chest Pain, Racing/skipping heartbeats, Difficulty Breathing Lying Down,  Murmur, Swelling and Palpitations. Gastrointestinal:Not Present- Bloody Stool, Heartburn, Abdominal Pain, Vomiting, Nausea, Constipation, Diarrhea, Difficulty Swallowing, Jaundice and Loss of appetitie. Female Genitourinary:Not Present- Blood in Urine, Urinary frequency, Weak urinary stream, Discharge, Flank Pain, Incontinence, Painful Urination, Urgency, Urinary Retention and Urinating at Night. Musculoskeletal:Not Present- Muscle Weakness, Muscle Pain, Joint Swelling, Joint Pain, Back Pain, Morning Stiffness and Spasms. Neurological:Not Present- Tremor, Dizziness, Blackout spells, Paralysis, Difficulty with balance and Weakness. Psychiatric:Not Present- Insomnia.   Vitals  Pulse: 64 (Regular) Resp.: 14 (Unlabored) BP: 128/62 (Sitting, Right Arm, Standard)    Physical Exam  The physical exam findings are as follows:   General Mental Status - Alert, cooperative and good historian. General Appearance- pleasant. Not in acute distress. Orientation- Oriented X3. Build & Nutrition- Well nourished and Well developed.   Head and Neck Head- normocephalic, atraumatic . Neck Global Assessment- supple. no bruit auscultated on the right and no bruit auscultated on the left.   Eye Pupil- Bilateral- Regular and Round. Motion- Bilateral- EOMI.   Chest and Lung Exam Auscultation: Breath sounds:- clear at anterior chest wall and - clear at posterior chest wall. Adventitious sounds:- No Adventitious sounds.   Cardiovascular Auscultation:Rhythm- Regular rate and rhythm. Heart Sounds- S1 WNL and S2 WNL. Murmurs & Other Heart Sounds:Auscultation of the heart reveals - No Murmurs.   Abdomen Inspection:Contour- Generalized mild distention. Palpation/Percussion:Tenderness- Abdomen is non-tender to  palpation. Rigidity (guarding)- Abdomen is soft. Auscultation:Auscultation of the abdomen reveals - Bowel sounds normal.   Female Genitourinary  Not done, not pertinent to present illness  Musculoskeletal  On exam well developed female, alert and oriented in no apparent distress. Her hip shows normal range of motion with no discomfort. Her left knee, no effusion, range about 5 to 125. Slight crepitus on range of motion. No tenderness or instability. The right knee no effusion. Range about 10 to 120. Marked crepitus on range of motion. Tender medial greater than lateral with no instability noted. Pulse sensation motor intact.  RADIOGRAPHS: She is bone on bone medial and patellofemoral.  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 Lequita Halt.  Plan is to go to rehab at [facility] at [place].  Signed electronically by Lauraine Rinne, III PA-C

## 2012-12-29 NOTE — Op Note (Signed)
Pre-operative diagnosis- Osteoarthritis  Right knee(s)  Post-operative diagnosis- Osteoarthritis Right knee(s)  Procedure-  Right  Total Knee Arthroplasty  Surgeon- Gus Rankin. Urania Pearlman, MD  Assistant- Dimitri Ped, PA-C   Anesthesia-  Spinal EBL-* No blood loss amount entered *  Drains Hemovac  Tourniquet time-  Total Tourniquet Time Documented: Thigh (Right) - 42 minutes Total: Thigh (Right) - 42 minutes    Complications- None  Condition-PACU - hemodynamically stable.   Brief Clinical Note  [name] is a 72 y.o. year old female with end stage OA of her right knee with progressively worsening pain and dysfunction. She has constant pain, with activity and at rest and significant functional deficits with difficulties even with ADLs. She has had extensive non-op management including analgesics, injections of cortisone and viscosupplements, and home exercise program, but remains in significant pain with significant dysfunction.Radiographs show bone on bone arthritis medial and patellofemoral. She presents now for right Total Knee Arthroplasty.    Procedure in detail---   The patient is brought into the operating room and positioned supine on the operating table. After successful administration of  Spinal,   a tourniquet is placed high on the  Right thigh(s) and the lower extremity is prepped and draped in the usual sterile fashion. Time out is performed by the operating team and then the  Right lower extremity is wrapped in Esmarch, knee flexed and the tourniquet inflated to 300 mmHg.       A midline incision is made with a ten blade through the subcutaneous tissue to the level of the extensor mechanism. A fresh blade is used to make a medial parapatellar arthrotomy. Soft tissue over the proximal medial tibia is subperiosteally elevated to the joint line with a knife and into the semimembranosus bursa with a Cobb elevator. Soft tissue over the proximal lateral tibia is elevated with  attention being paid to avoiding the patellar tendon on the tibial tubercle. The patella is everted, knee flexed 90 degrees and the ACL and PCL are removed. Findings are bone on bone medial and patellofemoral with large medial osteophytes.        The drill is used to create a starting hole in the distal femur and the canal is thoroughly irrigated with sterile saline to remove the fatty contents. The 5 degree Right  valgus alignment guide is placed into the femoral canal and the distal femoral cutting block is pinned to remove 11 mm off the distal femur. Resection is made with an oscillating saw.      The tibia is subluxed forward and the menisci are removed. The extramedullary alignment guide is placed referencing proximally at the medial aspect of the tibial tubercle and distally along the second metatarsal axis and tibial crest. The block is pinned to remove 2mm off the more deficient medial  side. Resection is made with an oscillating saw. Size 2.5is the most appropriate size for the tibia and the proximal tibia is prepared with the modular drill and keel punch for that size.      The femoral sizing guide is placed and size 2.5 is most appropriate. Rotation is marked off the epicondylar axis and confirmed by creating a rectangular flexion gap at 90 degrees. The size 2.5 cutting block is pinned in this rotation and the anterior, posterior and chamfer cuts are made with the oscillating saw. The intercondylar block is then placed and that cut is made.      Trial size 2.5 tibial component, trial size 2.5 posterior  stabilized femur and a 12.5  mm posterior stabilized rotating platform insert trial is placed. Full extension is achieved with excellent varus/valgus and anterior/posterior balance throughout full range of motion. The patella is everted and thickness measured to be 22  mm. Free hand resection is taken to 12 mm, a 35 template is placed, lug holes are drilled, trial patella is placed, and it tracks  normally. Osteophytes are removed off the posterior femur with the trial in place. All trials are removed and the cut bone surfaces prepared with pulsatile lavage. Cement is mixed and once ready for implantation, the size 2.5 tibial implant, size  2.5 posterior stabilized femoral component, and the size 35 patella are cemented in place and the patella is held with the clamp. The trial insert is placed and the knee held in full extension. The Exparel (20 ml mixed with 30 ml saline) and .25% Bupivicaine, are injected into the extensor mechanism, posterior capsule, medial and lateral gutters and subcutaneous tissues.  All extruded cement is removed and once the cement is hard the permanent 12.5 mm posterior stabilized rotating platform insert is placed into the tibial tray.      The wound is copiously irrigated with saline solution and the extensor mechanism closed over a hemovac drain with #1 PDS suture. The tourniquet is released for a total tourniquet time of 41  minutes. Flexion against gravity is 135 degrees and the patella tracks normally. Subcutaneous tissue is closed with 2.0 vicryl and subcuticular with running 4.0 Monocryl. The incision is cleaned and dried and steri-strips and a bulky sterile dressing are applied. The limb is placed into a knee immobilizer and the patient is awakened and transported to recovery in stable condition.      Please note that a surgical assistant was a medical necessity for this procedure in order to perform it in a safe and expeditious manner. Surgical assistant was necessary to retract the ligaments and vital neurovascular structures to prevent injury to them and also necessary for proper positioning of the limb to allow for anatomic placement of the prosthesis.   Gus Rankin Stephanie Aronoff, MD    12/29/2012, 10:47 AM

## 2012-12-29 NOTE — Evaluation (Signed)
Physical Therapy Evaluation Patient Details Name: [name] MRN: [id number] DOB: 26-Jun-1941 Today's Date: 12/29/2012 Time: 7829-5621 PT Time Calculation (min): 24 min  PT Assessment / Plan / Recommendation Clinical Impression  Pt is a 72 year old female s/p R TKR.  Pt would benefit from acute PT services in order to improve independence with transfers and ambulation by increasing strength and ROM of R LE in preparation for d/c to SNF.  Pt with limited evaluation due to LEs still numb upon standing so assisted back to supine.    PT Assessment  Patient needs continued PT services    Follow Up Recommendations  SNF    Does the patient have the potential to tolerate intense rehabilitation      Barriers to Discharge        Equipment Recommendations  Rolling walker with 5" wheels    Recommendations for Other Services     Frequency 7X/week    Precautions / Restrictions Precautions Precautions: Knee Required Braces or Orthoses: Knee Immobilizer - Right Knee Immobilizer - Right: Discontinue once straight leg raise with < 10 degree lag Restrictions Weight Bearing Restrictions: No Other Position/Activity Restrictions: WBAT   Pertinent Vitals/Pain No pain at rest, pt reports minimal pain with mobility, RN in to give IV tylenol, ice packs applied      Mobility  Bed Mobility Bed Mobility: Supine to Sit;Sit to Supine Supine to Sit: 1: +2 Total assist Supine to Sit: Patient Percentage: 50% Sit to Supine: 1: +2 Total assist Sit to Supine: Patient Percentage: 50% Details for Bed Mobility Assistance: assist for legs and trunk as well as scooting hips to EOB with bed pad, verbal cues for technique, pt with nausea and vomiting EOB however felt better after emesis Transfers Transfers: Sit to Stand;Stand to Sit Sit to Stand: 1: +2 Total assist;From bed;From elevated surface;With upper extremity assist Sit to Stand: Patient Percentage: 60% Stand to Sit: 1: +2 Total assist;To bed;To  elevated surface;With upper extremity assist Stand to Sit: Patient Percentage: 60% Details for Transfer Assistance: verbal cues for technique, assist for rise and controlling descent, L LE into hyperextension and pt not WBing well so had pt return to sitting for safety (pt reports also not feeling legs well upon standing so spinal not yet fully worn off) Ambulation/Gait Ambulation/Gait Assistance: Not tested (comment)    Exercises     PT Diagnosis: Difficulty walking;Acute pain  PT Problem List: Decreased strength;Decreased range of motion;Decreased activity tolerance;Decreased mobility;Decreased knowledge of precautions;Decreased knowledge of use of DME;Pain PT Treatment Interventions: Gait training;DME instruction;Patient/family education;Functional mobility training;Therapeutic activities;Therapeutic exercise   PT Goals Acute Rehab PT Goals PT Goal Formulation: With patient Time For Goal Achievement: 01/05/13 Potential to Achieve Goals: Good Pt will go Supine/Side to Sit: with supervision PT Goal: Supine/Side to Sit - Progress: Goal set today Pt will go Sit to Stand: with supervision PT Goal: Sit to Stand - Progress: Goal set today Pt will go Stand to Sit: with supervision PT Goal: Stand to Sit - Progress: Goal set today Pt will Ambulate: 51 - 150 feet;with supervision;with least restrictive assistive device PT Goal: Ambulate - Progress: Goal set today Pt will Perform Home Exercise Program: with supervision, verbal cues required/provided PT Goal: Perform Home Exercise Program - Progress: Goal set today  Visit Information  Last PT Received On: 12/29/12 Assistance Needed: +2    Subjective Data  Subjective: agreeable to PT POD 0 Patient Stated Goal: SNF for rehab   Prior Functioning  Home Living Available  Help at Discharge: Skilled Nursing Facility Home Adaptive Equipment: None Prior Function Level of Independence: Independent Communication Communication: No difficulties     Cognition  Cognition Arousal/Alertness: Awake/alert Behavior During Therapy: WFL for tasks assessed/performed Overall Cognitive Status: Within Functional Limits for tasks assessed    Extremity/Trunk Assessment Right Lower Extremity Assessment RLE ROM/Strength/Tone: Deficits RLE ROM/Strength/Tone Deficits: unable to perform SLR, maintained KI Left Lower Extremity Assessment LLE ROM/Strength/Tone: Deficits LLE ROM/Strength/Tone Deficits: able to lift against gravity however functional weakness observed, likely due to spinal not fully worn off however pt reports sensation in foot   Balance    End of Session PT - End of Session Equipment Utilized During Treatment: Right knee immobilizer Activity Tolerance: Patient limited by fatigue Patient left: in bed;with family/visitor present;with call bell/phone within reach;with nursing in room CPM Right Knee CPM Right Knee: Off (PT removed at 1550)  GP     Andora Krull,KATHrine E 12/29/2012, 4:26 PM Zenovia Jarred, PT, DPT 12/29/2012 Pager: [phone]

## 2012-12-29 NOTE — Anesthesia Procedure Notes (Addendum)
Spinal   Spinal  Patient location during procedure: OR Start time: 12/29/2012 9:35 AM End time: 12/29/2012 9:42 AM Staffing Performed by: resident/CRNA  Preanesthetic Checklist Completed: patient identified, site marked, surgical consent, pre-op evaluation, timeout performed, IV checked, risks and benefits discussed and monitors and equipment checked Spinal Block Patient position: sitting Prep: Betadine Patient monitoring: heart rate, cardiac monitor, continuous pulse ox and blood pressure Approach: midline Location: L3-4 Injection technique: single-shot Needle Needle type: Spinocan  Needle gauge: 22 G Needle length: 9 cm Needle insertion depth: 7 cm Assessment Sensory level: T8  Spinal  Additional Notes No heme no paresthesias

## 2012-12-29 NOTE — Plan of Care (Signed)
Problem: Consults Goal: Diagnosis- Total Joint Replacement Right total knee     

## 2012-12-29 NOTE — Anesthesia Preprocedure Evaluation (Addendum)
Anesthesia Evaluation  Patient identified by MRN, date of birth, ID band Patient awake    Reviewed: Allergy & Precautions, H&P , NPO status , Patient's Chart, lab work & pertinent test results  Airway Mallampati: II TM Distance: >3 FB Neck ROM: Full    Dental no notable dental hx.    Pulmonary neg pulmonary ROS,  breath sounds clear to auscultation  Pulmonary exam normal       Cardiovascular hypertension, Pt. on medications negative cardio ROS  Rhythm:Regular Rate:Normal     Neuro/Psych negative neurological ROS  negative psych ROS   GI/Hepatic negative GI ROS, Neg liver ROS,   Endo/Other  negative endocrine ROSHypothyroidism Morbid obesity  Renal/GU negative Renal ROS  negative genitourinary   Musculoskeletal negative musculoskeletal ROS (+)   Abdominal   Peds negative pediatric ROS (+)  Hematology negative hematology ROS (+)   Anesthesia Other Findings   Reproductive/Obstetrics negative OB ROS                          Anesthesia Physical Anesthesia Plan  ASA: II  Anesthesia Plan: Spinal   Post-op Pain Management:    Induction:   Airway Management Planned: Simple Face Mask  Additional Equipment:   Intra-op Plan:   Post-operative Plan:   Informed Consent: I have reviewed the patients History and Physical, chart, labs and discussed the procedure including the risks, benefits and alternatives for the proposed anesthesia with the patient or authorized representative who has indicated his/her understanding and acceptance.   Dental advisory given  Plan Discussed with: CRNA  Anesthesia Plan Comments:         Anesthesia Quick Evaluation

## 2012-12-29 NOTE — Interval H&P Note (Signed)
History and Physical Interval Note:  12/29/2012 7:05 AM  [name]  has presented today for surgery, with the diagnosis of OSTEOARTHRITIS RIGHT KNEE  The various methods of treatment have been discussed with the patient and family. After consideration of risks, benefits and other options for treatment, the patient has consented to  Procedure(s): RIGHT TOTAL KNEE ARTHROPLASTY (Right) as a surgical intervention .  The patient's history has been reviewed, patient examined, no change in status, stable for surgery.  I have reviewed the patient's chart and labs.  Questions were answered to the patient's satisfaction.     Loanne Drilling

## 2012-12-29 NOTE — Progress Notes (Signed)
Utilization review completed.  

## 2012-12-30 ENCOUNTER — Encounter (HOSPITAL_COMMUNITY): Payer: Self-pay | Admitting: Orthopedic Surgery

## 2012-12-30 DIAGNOSIS — D62 Acute posthemorrhagic anemia: Secondary | ICD-10-CM

## 2012-12-30 LAB — CBC
HCT: 35.9 % — ABNORMAL LOW (ref 36.0–46.0)
Hemoglobin: 11.8 g/dL — ABNORMAL LOW (ref 12.0–15.0)
MCHC: 32.9 g/dL (ref 30.0–36.0)

## 2012-12-30 LAB — BASIC METABOLIC PANEL
BUN: 10 mg/dL (ref 6–23)
Chloride: 106 mEq/L (ref 96–112)
GFR calc non Af Amer: 87 mL/min — ABNORMAL LOW (ref >90)
Glucose, Bld: 137 mg/dL — ABNORMAL HIGH (ref 70–99)
Potassium: 4.5 mEq/L (ref 3.5–5.1)

## 2012-12-30 NOTE — Progress Notes (Signed)
Clinical Social Work Department BRIEF PSYCHOSOCIAL ASSESSMENT 12/30/2012  Patient:  [name], [name]     Account Number:  [id number]     Admit date:  12/29/2012  Clinical Social Worker:  Candie Chroman  Date/Time:  12/30/2012 08:40 AM  Referred by:  Physician  Date Referred:  12/30/2012 Referred for  SNF Placement   Other Referral:   Interview type:  Patient Other interview type:    PSYCHOSOCIAL DATA Living Status:  ALONE Admitted from facility:   Level of care:   Primary support name:  [place] [employer] Primary support relationship to patient:  CHILD, ADULT Degree of support available:   supportive    CURRENT CONCERNS Current Concerns  Post-Acute Placement   Other Concerns:    SOCIAL WORK ASSESSMENT / PLAN Pt is a 72 yr old female living at home prior to hospitalization. CSW met with pt to assist with d/c planning . Pt has made prior arrangements to have ST Rehab at [facility] following hospital d/c. CSW contacted SNF and d/c plans were confirmed. CSW will continue to follow to assist with d/c planning to SNF.   Assessment/plan status:  Psychosocial Support/Ongoing Assessment of Needs Other assessment/ plan:   Information/referral to community resources:   Carlena Bjornstad is out of network with [facility]. Pt is aware . SNF reviewed in network and out of network benefits and found they were similar. Pt informed of this and that she may be charged copayment's at the end of placement.    PATIENT'S/FAMILY'S RESPONSE TO PLAN OF CARE: Pt is looking forward to ST Rehab at [facility].   Cori Razor LCSW [phone]

## 2012-12-30 NOTE — Care Management Note (Signed)
    Page 1 of 1   12/30/2012     12:37:09 PM   CARE MANAGEMENT NOTE 12/30/2012  Patient:  [name], [name]   Account Number:  [id number]  Date Initiated:  12/30/2012  Documentation initiated by:  Lorenda Ishihara  Subjective/Objective Assessment:   72 yo female admitted s/p right TKA. PTA lived at home alone.     Action/Plan:   Plans for SNF for rehab   Anticipated DC Date:  01/01/2013   Anticipated DC Plan:  SKILLED NURSING FACILITY  In-house referral  Clinical Social Worker      DC Planning Services  CM consult      Choice offered to / List presented to:             Status of service:  Completed, signed off Medicare Important Message given?   (If response is "NO", the following Medicare IM given date fields will be blank) Date Medicare IM given:   Date Additional Medicare IM given:    Discharge Disposition:  SKILLED NURSING FACILITY  Per UR Regulation:  Reviewed for med. necessity/level of care/duration of stay  If discussed at Long Length of Stay Meetings, dates discussed:    Comments:

## 2012-12-30 NOTE — Evaluation (Signed)
Occupational Therapy Evaluation Patient Details Name: [name] MRN: [id number] DOB: 1940-08-14 Today's Date: 12/30/2012 Time: 4098-1191 OT Time Calculation (min): 19 min  OT Assessment / Plan / Recommendation Clinical Impression  This 72 year old female was admitted for R TKA. She will benefit from skilled OT to increase independence with adls with min A to supervision level goals in acute    OT Assessment  Patient needs continued OT Services    Follow Up Recommendations  SNF    Barriers to Discharge      Equipment Recommendations  3 in 1 bedside comode    Recommendations for Other Services    Frequency  Min 2X/week    Precautions / Restrictions Precautions Precautions: Knee Required Braces or Orthoses: Knee Immobilizer - Right Knee Immobilizer - Right: Discontinue once straight leg raise with < 10 degree lag Restrictions Weight Bearing Restrictions: No Other Position/Activity Restrictions: WBAT   Pertinent Vitals/Pain Min knee pain; pt feeling nauseaus at time of eval    ADL  Grooming: Set up Where Assessed - Grooming: Unsupported sitting Upper Body Bathing: Set up Where Assessed - Upper Body Bathing: Unsupported sitting Lower Body Bathing: Moderate assistance Where Assessed - Lower Body Bathing: Supported sit to stand Upper Body Dressing: Minimal assistance Where Assessed - Upper Body Dressing: Unsupported sitting Lower Body Dressing: Maximal assistance Where Assessed - Lower Body Dressing: Supported sit to stand Toilet Transfer: Minimal assistance Toilet Transfer Method: Surveyor, minerals: Materials engineer and Hygiene: Minimal assistance Where Assessed - Engineer, mining and Hygiene: Sit to stand from 3-in-1 or toilet Equipment Used: Rolling walker Transfers/Ambulation Related to ADLs: spt to 3:1 then 3 steps over to bed with min A. C ADL Comments: educated on AE but did not use this.     OT Diagnosis: Generalized weakness  OT Problem List: Decreased strength;Decreased activity tolerance;Decreased knowledge of use of DME or AE;Pain OT Treatment Interventions: Self-care/ADL training;Energy conservation;Patient/family education   OT Goals Acute Rehab OT Goals OT Goal Formulation: With patient Time For Goal Achievement: 01/06/13 Potential to Achieve Goals: Good ADL Goals Pt Will Perform Grooming: with supervision;Standing at sink ADL Goal: Grooming - Progress: Goal set today Pt Will Perform Lower Body Bathing: with supervision;Sit to stand from chair (with AE) ADL Goal: Lower Body Bathing - Progress: Goal set today Pt Will Transfer to Toilet: with min assist;Ambulation;3-in-1 (min guard) ADL Goal: Toilet Transfer - Progress: Goal set today Pt Will Perform Toileting - Hygiene: with supervision;Sit to stand from 3-in-1/toilet ADL Goal: Toileting - Hygiene - Progress: Goal set today  Visit Information  Last OT Received On: 12/30/12 Assistance Needed: +2    Subjective Data  Subjective: I'd like to get back to bed after this, if that is OK Patient Stated Goal: plans rehab then home   Prior Functioning     Prior Function Level of Independence: Independent Communication Communication: No difficulties         Vision/Perception     Cognition  Cognition Arousal/Alertness: Awake/alert Behavior During Therapy: WFL for tasks assessed/performed Overall Cognitive Status: Within Functional Limits for tasks assessed    Extremity/Trunk Assessment Right Upper Extremity Assessment RUE ROM/Strength/Tone: [facility] for tasks assessed Left Upper Extremity Assessment LUE ROM/Strength/Tone: WFL for tasks assessed     Mobility   Transfers Sit to Stand: 4: Min assist;With upper extremity assist;From bed Stand to Sit: 4: Min guard;With upper extremity assist;To chair/3-in-1 Details for Transfer Assistance: verbal cues for technique, assist to rise  Exercise      Balance     End of Session OT - End of Session Activity Tolerance: Patient tolerated treatment well Patient left: in bed;with call bell/phone within reach CPM Right Knee CPM Right Knee: Off Right Knee Flexion (Degrees): 40 Right Knee Extension (Degrees): 10  GO     Arlee Santosuosso 12/30/2012, 11:44 AM Marica Otter, OTR/L [phone] 12/30/2012

## 2012-12-30 NOTE — Progress Notes (Signed)
Physical Therapy Treatment Patient Details Name: [name] MRN: [id number] DOB: 16-Jun-1941 Today's Date: 12/30/2012 Time: 3244-0102 PT Time Calculation (min): 27 min  PT Assessment / Plan / Recommendation Comments on Treatment Session  Pt ambulated in hallway but became nauseated limiting distance and able to perform exercises in recliner after short rest break from ambulation.    Follow Up Recommendations  SNF     Does the patient have the potential to tolerate intense rehabilitation     Barriers to Discharge        Equipment Recommendations  Rolling walker with 5" wheels    Recommendations for Other Services    Frequency     Plan Discharge plan remains appropriate;Frequency remains appropriate    Precautions / Restrictions Precautions Precautions: Knee Required Braces or Orthoses: Knee Immobilizer - Right Knee Immobilizer - Right: Discontinue once straight leg raise with < 10 degree lag Restrictions Weight Bearing Restrictions: No Other Position/Activity Restrictions: WBAT   Pertinent Vitals/Pain Premedicated, ice packs applied    Mobility  Bed Mobility Bed Mobility: Supine to Sit Supine to Sit: 4: Min assist Details for Bed Mobility Assistance: verbal cues for safe technique, assist for R LE Transfers Transfers: Sit to Stand;Stand to Sit Sit to Stand: 4: Min assist;With upper extremity assist;From bed Stand to Sit: 4: Min guard;With upper extremity assist;To chair/3-in-1 Details for Transfer Assistance: verbal cues for technique, assist to rise Ambulation/Gait Ambulation/Gait Assistance: 4: Min guard Ambulation Distance (Feet): 10 Feet Assistive device: Rolling walker Ambulation/Gait Assistance Details: increased time, verbal cues for sequence and RW placement Gait Pattern: Step-to pattern;Antalgic Gait velocity: decreased    Exercises Total Joint Exercises Ankle Circles/Pumps: AROM;15 reps;Both Quad Sets: AROM;Both;15 reps Short Arc QuadBarbaraann Boys;Right;15  reps Heel Slides: AAROM;Right;15 reps Hip ABduction/ADduction: AAROM;15 reps;Right Straight Leg Raises: Right;AAROM;10 reps Goniometric ROM: R knee AAROM -8-45*   PT Diagnosis:    PT Problem List:   PT Treatment Interventions:     PT Goals Acute Rehab PT Goals PT Goal: Supine/Side to Sit - Progress: Progressing toward goal PT Goal: Sit to Stand - Progress: Progressing toward goal PT Goal: Stand to Sit - Progress: Progressing toward goal PT Goal: Ambulate - Progress: Progressing toward goal PT Goal: Perform Home Exercise Program - Progress: Progressing toward goal  Visit Information  Last PT Received On: 12/30/12 Assistance Needed: +2    Subjective Data  Subjective: pt still with nausea, RN medicated   Cognition  Cognition Arousal/Alertness: Awake/alert Behavior During Therapy: WFL for tasks assessed/performed Overall Cognitive Status: Within Functional Limits for tasks assessed    Balance     End of Session PT - End of Session Equipment Utilized During Treatment: Right knee immobilizer Activity Tolerance: Patient limited by fatigue Patient left: with call bell/phone within reach;in chair CPM Right Knee CPM Right Knee: Off Right Knee Flexion (Degrees): 40 Right Knee Extension (Degrees): 10   GP     Nikhil Osei,KATHrine E 12/30/2012, 11:47 AM Zenovia Jarred, PT, DPT 12/30/2012 Pager: [phone]

## 2012-12-30 NOTE — Progress Notes (Signed)
   Subjective: 1 Day Post-Op Procedure(s) (LRB): RIGHT TOTAL KNEE ARTHROPLASTY (Right) Patient reports pain as mild and moderate.   Patient seen in rounds with Dr. Lequita Halt. Rough night but better today. Patient is well, but has had some minor complaints of pain in the knee, requiring pain medications We will start therapy today.  Plan is to go Skilled nursing facility after hospital stay.  Objective: Vital signs in last 24 hours: Temp:  [96.2 F (35.7 C)-98.7 F (37.1 C)] 98 F (36.7 C) (06/24 0609) Pulse Rate:  [52-84] 84 (06/24 0609) Resp:  [14-22] 16 (06/24 0609) BP: (103-146)/(54-79) 116/67 mmHg (06/24 0609) SpO2:  [98 %-100 %] 99 % (06/24 0609) Weight:  [98.431 kg (217 lb)] 98.431 kg (217 lb) (06/23 1330)  Intake/Output from previous day:  Intake/Output Summary (Last 24 hours) at 12/30/12 0903 Last data filed at 12/30/12 0600  Gross per 24 hour  Intake 3933.75 ml  Output   1795 ml  Net 2138.75 ml    Intake/Output this shift:    Labs:  Recent Labs  12/30/12 0420  HGB 11.8*    Recent Labs  12/30/12 0420  WBC 7.1  RBC 4.02  HCT 35.9*  PLT 162    Recent Labs  12/30/12 0420  NA 138  K 4.5  CL 106  CO2 27  BUN 10  CREATININE 0.63  GLUCOSE 137*  CALCIUM 9.5   No results found for this basename: LABPT, INR,  in the last 72 hours  EXAM General - Patient is Alert, Appropriate and Oriented Extremity - Neurovascular intact Sensation intact distally Dorsiflexion/Plantar flexion intact Dressing - dressing C/D/I Motor Function - intact, moving foot and toes well on exam.  Hemovac pulled without difficulty.  Past Medical History  Diagnosis Date  . Hypertension   . Elevated cholesterol   . GERD (gastroesophageal reflux disease)   . IBS (irritable bowel syndrome)   . Hypothyroidism   . Arthritis     RIGHT KNEE PAIN AND OA  . Swelling     CHRONIC SWELLING RIGHT HAND AND BOTH FEET AND BOTH LEGS  . Rotator cuff disorder     BOTH SHOULDERS - NO  SURGERY - AND STATES LIMITATIONS IN ARM MOVEMENT  . Vertigo     Assessment/Plan: 1 Day Post-Op Procedure(s) (LRB): RIGHT TOTAL KNEE ARTHROPLASTY (Right) Principal Problem:   OA (osteoarthritis) of knee Active Problems:   Postoperative anemia due to acute blood loss  Estimated body mass index is 38.45 kg/(m^2) as calculated from the following:   Height as of this encounter: 5\' 3"  (1.6 m).   Weight as of this encounter: 98.431 kg (217 lb). Advance diet Up with therapy Discharge to SNF  DVT Prophylaxis - Xarelto Weight-Bearing as tolerated to right leg No vaccines. D/C O2 and Pulse OX and try on Room Air  Talisa Petrak, Marlowe Sax 12/30/2012, 9:03 AM

## 2012-12-30 NOTE — Progress Notes (Signed)
Clinical Social Work Department CLINICAL SOCIAL WORK PLACEMENT NOTE 12/30/2012  Patient:  [name], [name]  Account Number:  [id number] Admit date:  12/29/2012  Clinical Social Worker:  Cori Razor, LCSW  Date/time:  12/30/2012 08:55 AM  Clinical Social Work is seeking post-discharge placement for this patient at the following level of care:   SKILLED NURSING   (*CSW will update this form in Epic as items are completed)     Patient/family provided with [facility] Department of Clinical Social Work's list of facilities offering this level of care within the geographic area requested by the patient (or if unable, by the patient's family).  12/30/2012  Patient/family informed of their freedom to choose among providers that offer the needed level of care, that participate in Medicare, Medicaid or managed care program needed by the patient, have an available bed and are willing to accept the patient.    Patient/family informed of [facility]' ownership interest in [facility], as well as of the fact that they are under no obligation to receive care at this facility.  PASARR submitted to [employer] on 12/29/2012 PASARR number received from [employer] on   FL2 transmitted to all facilities in geographic area requested by pt/family on  12/30/2012 FL2 transmitted to all facilities within larger geographic area on   Patient informed that his/her managed care company has contracts with or will negotiate with  certain facilities, including the following:     Patient/family informed of bed offers received:  12/30/2012 Patient chooses bed at [facility] at [facility] Physician recommends and patient chooses bed at    Patient to be transferred to [facility] at [facility] on   Patient to be transferred to facility by   The following physician request were entered in Epic:   Additional Comments:  Cori Razor  LCSW [phone]

## 2012-12-31 LAB — CBC
HCT: 34.9 % — ABNORMAL LOW (ref 36.0–46.0)
Hemoglobin: 12.2 g/dL (ref 12.0–15.0)
MCH: 31 pg (ref 26.0–34.0)
MCHC: 35 g/dL (ref 30.0–36.0)
MCV: 88.6 fL (ref 78.0–100.0)

## 2012-12-31 LAB — BASIC METABOLIC PANEL
BUN: 7 mg/dL (ref 6–23)
Calcium: 10 mg/dL (ref 8.4–10.5)
Creatinine, Ser: 0.56 mg/dL (ref 0.50–1.10)
GFR calc non Af Amer: >90 mL/min (ref >90)
Glucose, Bld: 204 mg/dL — ABNORMAL HIGH (ref 70–99)

## 2012-12-31 MED ORDER — POTASSIUM CHLORIDE CRYS ER 20 MEQ PO TBCR
40.0000 meq | EXTENDED_RELEASE_TABLET | Freq: Once | ORAL | Status: AC
  Administered 2012-12-31: 40 meq via ORAL
  Filled 2012-12-31: qty 2

## 2012-12-31 NOTE — Progress Notes (Signed)
   Subjective: 2 Days Post-Op Procedure(s) (LRB): RIGHT TOTAL KNEE ARTHROPLASTY (Right) Patient reports pain as mild.   Patient seen in rounds by Dr. Lequita Halt. Patient is well, but has had some minor complaints of pain in the knee, requiring pain medications Plan is to go Skilled nursing facility after hospital stay.  Objective: Vital signs in last 24 hours: Temp:  [97.5 F (36.4 C)-98.2 F (36.8 C)] 98.2 F (36.8 C) (06/25 0603) Pulse Rate:  [74-90] 80 (06/25 0945) Resp:  [14-18] 16 (06/25 0603) BP: (121-160)/(62-71) 160/66 mmHg (06/25 0945) SpO2:  [93 %-95 %] 93 % (06/25 0603)  Intake/Output from previous day:  Intake/Output Summary (Last 24 hours) at 12/31/12 1249 Last data filed at 12/31/12 0945  Gross per 24 hour  Intake   1340 ml  Output   1550 ml  Net   -210 ml    Intake/Output this shift: Total I/O In: 240 [P.O.:240] Out: 200 [Urine:200]  Labs:  Recent Labs  12/30/12 0420 12/31/12 0401  HGB 11.8* 12.2    Recent Labs  12/30/12 0420 12/31/12 0401  WBC 7.1 10.3  RBC 4.02 3.94  HCT 35.9* 34.9*  PLT 162 169    Recent Labs  12/30/12 0420 12/31/12 0401  NA 138 136  K 4.5 3.5  CL 106 106  CO2 27 26  BUN 10 7  CREATININE 0.63 0.56  GLUCOSE 137* 204*  CALCIUM 9.5 10.0   No results found for this basename: LABPT, INR,  in the last 72 hours  EXAM General - Patient is Alert, Appropriate and Oriented Extremity - Neurovascular intact Sensation intact distally Dorsiflexion/Plantar flexion intact No cellulitis present Dressing/Incision - clean, dry, no drainage, healing Motor Function - intact, moving foot and toes well on exam.   Past Medical History  Diagnosis Date  . Hypertension   . Elevated cholesterol   . GERD (gastroesophageal reflux disease)   . IBS (irritable bowel syndrome)   . Hypothyroidism   . Arthritis     RIGHT KNEE PAIN AND OA  . Swelling     CHRONIC SWELLING RIGHT HAND AND BOTH FEET AND BOTH LEGS  . Rotator cuff disorder       BOTH SHOULDERS - NO SURGERY - AND STATES LIMITATIONS IN ARM MOVEMENT  . Vertigo     Assessment/Plan: 2 Days Post-Op Procedure(s) (LRB): RIGHT TOTAL KNEE ARTHROPLASTY (Right) Principal Problem:   OA (osteoarthritis) of knee Active Problems:   Postoperative anemia due to acute blood loss  Estimated body mass index is 38.45 kg/(m^2) as calculated from the following:   Height as of this encounter: 5\' 3"  (1.6 m).   Weight as of this encounter: 98.431 kg (217 lb). Up with therapy Plan for discharge tomorrow Discharge to SNF  DVT Prophylaxis - Xarelto Weight-Bearing as tolerated to right leg  Handy Mcloud 12/31/2012, 12:49 PM

## 2012-12-31 NOTE — Progress Notes (Addendum)
PT Treatment Note  Pt ambulated in hallway and doing well however very slow gait speed.  Pt plans to d/c to SNF tomorrow.   12/31/12 1500  PT Visit Information  Last PT Received On 12/31/12  Assistance Needed +1  PT Time Calculation  PT Start Time 1417  PT Stop Time 1458  PT Time Calculation (min) 41 min  Precautions  Precautions Knee  Required Braces or Orthoses Knee Immobilizer - Right  Knee Immobilizer - Right Discontinue once straight leg raise with < 10 degree lag  Restrictions  Other Position/Activity Restrictions WBAT  Cognition  Arousal/Alertness Awake/alert  Behavior During Therapy WFL for tasks assessed/performed  Overall Cognitive Status Within Functional Limits for tasks assessed  Bed Mobility  Bed Mobility Supine to Sit;Sit to Supine  Supine to Sit 5: Supervision  Sit to Supine 5: Supervision  Details for Bed Mobility Assistance pt still unable to perform SLR so therapist applied KI, verbal cues for safe technique  Transfers  Transfers Sit to Stand;Stand to Sit  Sit to Stand 5: Supervision;With upper extremity assist;From bed;From chair/3-in-1  Stand to Sit With upper extremity assist;To chair/3-in-1;To bed;5: Supervision  Details for Transfer Assistance verbal cues for technique  Ambulation/Gait  Ambulation/Gait Assistance 4: Min guard;5: Supervision  Ambulation Distance (Feet) 100 Feet  Assistive device Rolling walker  Ambulation/Gait Assistance Details verbal cues for step length and posture, very very slow speed requiring increased time  Gait Pattern Step-to pattern;Decreased step length - right  General Gait Details also assisted into/out of bathroom prior to return to supine  PT - End of Session  Equipment Utilized During Treatment Right knee immobilizer  Activity Tolerance Patient tolerated treatment well  Patient left in bed;with call bell/phone within reach  PT - Assessment/Plan  PT Plan Current plan remains appropriate  Follow Up Recommendations SNF   PT equipment Rolling walker with 5" wheels  PT Goal Progression  Progress towards PT goals Progressing toward goals  PT General Charges  $$ ACUTE PT VISIT 1 Procedure  PT Treatments  $Gait Training 38-52 mins   Zenovia Jarred, PT, DPT 12/31/2012 Pager: [phone]

## 2012-12-31 NOTE — Progress Notes (Signed)
Physical Therapy Treatment Patient Details Name: [name] MRN: [id number] DOB: 11/06/1940 Today's Date: 12/31/2012 Time: 1324-4010 PT Time Calculation (min): 31 min  PT Assessment / Plan / Recommendation  PT Comments   Pt doing much better today and able to ambulate in hallway and perform exercises.  Plans to d/c to SNF tomorrow.  Follow Up Recommendations  SNF     Does the patient have the potential to tolerate intense rehabilitation     Barriers to Discharge        Equipment Recommendations  Rolling walker with 5" wheels    Recommendations for Other Services    Frequency     Progress towards PT Goals Progress towards PT goals: Progressing toward goals  Plan Current plan remains appropriate    Precautions / Restrictions Precautions Precautions: Knee Required Braces or Orthoses: Knee Immobilizer - Right Knee Immobilizer - Right: Discontinue once straight leg raise with < 10 degree lag Restrictions Other Position/Activity Restrictions: WBAT   Pertinent Vitals/Pain premedicated    Mobility  Bed Mobility Bed Mobility: Supine to Sit Supine to Sit: 5: Supervision Details for Bed Mobility Assistance: pt still unable to perform SLR so therapist applied KI, verbal cues for safe technique Transfers Transfers: Sit to Stand;Stand to Sit Sit to Stand: With upper extremity assist;4: Min guard;From chair/3-in-1;From bed Stand to Sit: 4: Min guard;With upper extremity assist;To chair/3-in-1 Details for Transfer Assistance: verbal cues for technique Ambulation/Gait Ambulation/Gait Assistance: 4: Min guard;5: Supervision Ambulation Distance (Feet): 40 Feet Assistive device: Rolling walker Ambulation/Gait Assistance Details: initial verbal cues for sequence, step length, and RW placement to/from bathroom and then pt did not need cues once in hallway for 40 feet Gait Pattern: Step-to pattern;Decreased step length - right Gait velocity: decreased, very slow pace    Exercises  Total Joint Exercises Ankle Circles/Pumps: AROM;15 reps;Both Quad Sets: AROM;Both;15 reps Gluteal Sets: AROM;Both;15 reps Short Arc QuadBarbaraann Boys;Right;15 reps Heel Slides: AAROM;Right;15 reps;Seated Hip ABduction/ADduction: AROM;Right;15 reps Straight Leg Raises: Right;AAROM;10 reps   PT Diagnosis:    PT Problem List:   PT Treatment Interventions:     PT Goals Additional Goals Additional Goal #1: Pt will be supervision with exercsies.  Visit Information  Last PT Received On: 12/31/12 Assistance Needed: +1    Subjective Data      Cognition  Cognition Arousal/Alertness: Awake/alert Behavior During Therapy: WFL for tasks assessed/performed Overall Cognitive Status: Within Functional Limits for tasks assessed    Balance     End of Session PT - End of Session Equipment Utilized During Treatment: Right knee immobilizer Activity Tolerance: Patient tolerated treatment well Patient left: with call bell/phone within reach;in chair   GP     Ardenia Stiner,KATHrine E 12/31/2012, 1:14 PM Zenovia Jarred, PT, DPT 12/31/2012 Pager: [phone]

## 2013-01-01 LAB — CBC
MCH: 29.4 pg (ref 26.0–34.0)
MCHC: 33 g/dL (ref 30.0–36.0)
Platelets: 194 10*3/uL (ref 150–400)

## 2013-01-01 MED ORDER — METOCLOPRAMIDE HCL 5 MG PO TABS: 5.0000 mg | ORAL_TABLET | Freq: Three times a day (TID) | ORAL | Status: DC | PRN

## 2013-01-01 MED ORDER — BISACODYL 10 MG RE SUPP: 10.0000 mg | Freq: Every day | RECTAL | Status: DC | PRN

## 2013-01-01 MED ORDER — OXYCODONE HCL 5 MG PO TABS: 5.0000 mg | ORAL_TABLET | ORAL | Status: DC | PRN

## 2013-01-01 MED ORDER — ONDANSETRON HCL 4 MG PO TABS: 4.0000 mg | ORAL_TABLET | Freq: Four times a day (QID) | ORAL | Status: DC | PRN

## 2013-01-01 MED ORDER — METHOCARBAMOL 500 MG PO TABS: 500.0000 mg | ORAL_TABLET | Freq: Four times a day (QID) | ORAL | Status: DC | PRN

## 2013-01-01 MED ORDER — DIPHENHYDRAMINE HCL 12.5 MG/5ML PO ELIX: 12.5000 mg | ORAL_SOLUTION | ORAL | Status: DC | PRN

## 2013-01-01 MED ORDER — RIVAROXABAN 10 MG PO TABS: 10.0000 mg | ORAL_TABLET | Freq: Every day | ORAL | Status: DC

## 2013-01-01 MED ORDER — DSS 100 MG PO CAPS: 100.0000 mg | ORAL_CAPSULE | Freq: Two times a day (BID) | ORAL | Status: DC

## 2013-01-01 NOTE — Anesthesia Postprocedure Evaluation (Signed)
  Anesthesia Post-op Note  Patient: [name]  Procedure(s) Performed: Procedure(s) (LRB): RIGHT TOTAL KNEE ARTHROPLASTY (Right)  Patient Location: PACU  Anesthesia Type: Spinal  Level of Consciousness: awake and alert   Airway and Oxygen Therapy: Patient Spontanous Breathing  Post-op Pain: mild  Post-op Assessment: Post-op Vital signs reviewed, Patient's Cardiovascular Status Stable, Respiratory Function Stable, Patent Airway and No signs of Nausea or vomiting  Last Vitals:  Filed Vitals:   01/01/13 0445  BP: 116/63  Pulse: 82  Temp: 36.9 C  Resp: 16    Post-op Vital Signs: stable   Complications: No apparent anesthesia complications

## 2013-01-01 NOTE — Plan of Care (Signed)
Problem: Discharge Progression Outcomes Goal: Negotiates stairs Outcome: Not Met (add Reason) Pt transferring to rehab

## 2013-01-01 NOTE — Progress Notes (Signed)
   Subjective: 3 Days Post-Op Procedure(s) (LRB): RIGHT TOTAL KNEE ARTHROPLASTY (Right) Patient reports pain as mild.   Patient seen in rounds by Dr. Lequita Halt. Patient is well, and has had no acute complaints or problems Patient is ready to go to the SNF.  Objective: Vital signs in last 24 hours: Temp:  [97.5 F (36.4 C)-98.5 F (36.9 C)] 98.5 F (36.9 C) (06/26 0445) Pulse Rate:  [75-82] 82 (06/26 0445) Resp:  [14-16] 16 (06/26 0445) BP: (107-160)/(63-70) 116/63 mmHg (06/26 0445) SpO2:  [96 %-97 %] 97 % (06/26 0445)  Intake/Output from previous day:  Intake/Output Summary (Last 24 hours) at 01/01/13 0748 Last data filed at 01/01/13 0525  Gross per 24 hour  Intake    480 ml  Output    725 ml  Net   -245 ml    Intake/Output this shift:    Labs:  Recent Labs  12/30/12 0420 12/31/12 0401 01/01/13 0437  HGB 11.8* 12.2 11.0*    Recent Labs  12/31/12 0401 01/01/13 0437  WBC 10.3 10.7*  RBC 3.94 3.74*  HCT 34.9* 33.3*  PLT 169 194    Recent Labs  12/30/12 0420 12/31/12 0401  NA 138 136  K 4.5 3.5  CL 106 106  CO2 27 26  BUN 10 7  CREATININE 0.63 0.56  GLUCOSE 137* 204*  CALCIUM 9.5 10.0   No results found for this basename: LABPT, INR,  in the last 72 hours  EXAM: General - Patient is Alert, Appropriate and Oriented Extremity - Neurovascular intact Sensation intact distally Dorsiflexion/Plantar flexion intact No cellulitis present Incision - clean, dry, no drainage, healing Motor Function - intact, moving foot and toes well on exam.   Assessment/Plan: 3 Days Post-Op Procedure(s) (LRB): RIGHT TOTAL KNEE ARTHROPLASTY (Right) Procedure(s) (LRB): RIGHT TOTAL KNEE ARTHROPLASTY (Right) Past Medical History  Diagnosis Date  . Hypertension   . Elevated cholesterol   . GERD (gastroesophageal reflux disease)   . IBS (irritable bowel syndrome)   . Hypothyroidism   . Arthritis     RIGHT KNEE PAIN AND OA  . Swelling     CHRONIC SWELLING RIGHT HAND  AND BOTH FEET AND BOTH LEGS  . Rotator cuff disorder     BOTH SHOULDERS - NO SURGERY - AND STATES LIMITATIONS IN ARM MOVEMENT  . Vertigo    Principal Problem:   OA (osteoarthritis) of knee Active Problems:   Postoperative anemia due to acute blood loss  Estimated body mass index is 38.45 kg/(m^2) as calculated from the following:   Height as of this encounter: 5\' 3"  (1.6 m).   Weight as of this encounter: 98.431 kg (217 lb). Up with therapy Discharge to SNF Diet - Cardiac diet Follow up - in 2 weeks Activity - WBAT Disposition - Skilled nursing facility Condition Upon Discharge - Good D/C Meds - See DC Summary DVT Prophylaxis - Xarelto  [name] 01/01/2013, 7:48 AM

## 2013-01-01 NOTE — Progress Notes (Signed)
Physical Therapy Treatment Patient Details Name: [name] MRN: [id number] DOB: 03-Jan-1941 Today's Date: 01/01/2013 Time: 4098-1191 PT Time Calculation (min): 27 min  PT Assessment / Plan / Recommendation  PT Comments   Pt ambulated in hallway and performed a couple exercises in supine. Pt looking forward to d/c to SNF today.  Follow Up Recommendations  SNF     Does the patient have the potential to tolerate intense rehabilitation     Barriers to Discharge        Equipment Recommendations  Rolling walker with 5" wheels    Recommendations for Other Services    Frequency     Progress towards PT Goals Progress towards PT goals: Progressing toward goals  Plan Current plan remains appropriate    Precautions / Restrictions Precautions Precautions: Knee Required Braces or Orthoses: Knee Immobilizer - Right Knee Immobilizer - Right: Discontinue once straight leg raise with < 10 degree lag Restrictions Other Position/Activity Restrictions: WBAT   Pertinent Vitals/Pain Pt reports pain under control, ice packs applied to R knee    Mobility  Bed Mobility Bed Mobility: Sit to Supine Sit to Supine: 4: Min assist Details for Bed Mobility Assistance: pt assisted off BSC upon entering room, verbal cues for safe technique, assist for R LE onto bed due to fatigue Transfers Transfers: Sit to Stand;Stand to Sit Sit to Stand: 5: Supervision;With upper extremity assist;From bed;From chair/3-in-1 Stand to Sit: With upper extremity assist;To chair/3-in-1;To bed;5: Supervision Details for Transfer Assistance: verbal cues for technique Ambulation/Gait Ambulation/Gait Assistance: 4: Min guard;5: Supervision Ambulation Distance (Feet): 100 Feet Assistive device: Rolling walker Ambulation/Gait Assistance Details: verbal cues for posture and increasing step length, pt able to increase speed slightly today however still slow pace Gait Pattern: Step-to pattern;Decreased step length - right     Exercises Total Joint Exercises Quad Sets: AROM;Both;15 reps;Supine Short Arc Quad: AAROM;Right;15 reps;Supine Heel Slides: AAROM;Right;15 reps;Supine Goniometric ROM: knee flexion to 55* with AAROM supine   PT Diagnosis:    PT Problem List:   PT Treatment Interventions:     PT Goals (current goals can now be found in the care plan section)    Visit Information  Last PT Received On: 01/01/13 Assistance Needed: +1    Subjective Data      Cognition  Cognition Arousal/Alertness: Awake/alert Behavior During Therapy: [facility] for tasks assessed/performed Overall Cognitive Status: Within Functional Limits for tasks assessed    Balance     End of Session PT - End of Session Equipment Utilized During Treatment: Right knee immobilizer Activity Tolerance: Patient tolerated treatment well Patient left: in bed;with call bell/phone within reach   GP     Keiyon Plack,KATHrine E 01/01/2013, 1:32 PM Zenovia Jarred, PT, DPT 01/01/2013 Pager: [phone]

## 2013-01-01 NOTE — Discharge Summary (Signed)
Physician Discharge Summary   Patient ID: [name] MRN: [id number] DOB/AGE: 01-01-1941 72 y.o.  Admit date: 12/29/2012 Discharge date: 01/01/2013  Primary Diagnosis:  Osteoarthritis Right knee  Admission Diagnoses:  Past Medical History  Diagnosis Date  . Hypertension   . Elevated cholesterol   . GERD (gastroesophageal reflux disease)   . IBS (irritable bowel syndrome)   . Hypothyroidism   . Arthritis     RIGHT KNEE PAIN AND OA  . Swelling     CHRONIC SWELLING RIGHT HAND AND BOTH FEET AND BOTH LEGS  . Rotator cuff disorder     BOTH SHOULDERS - NO SURGERY - AND STATES LIMITATIONS IN ARM MOVEMENT  . Vertigo    Discharge Diagnoses:   Principal Problem:   OA (osteoarthritis) of knee Active Problems:   Postoperative anemia due to acute blood loss  Estimated body mass index is 38.45 kg/(m^2) as calculated from the following:   Height as of this encounter: 5\' 3"  (1.6 m).   Weight as of this encounter: 98.431 kg (217 lb).  Procedure:  Procedure(s) (LRB): RIGHT TOTAL KNEE ARTHROPLASTY (Right)   Consults: None  HPI: [name] is a 72 y.o. year old female with end stage OA of her right knee with progressively worsening pain and dysfunction. She has constant pain, with activity and at rest and significant functional deficits with difficulties even with ADLs. She has had extensive non-op management including analgesics, injections of cortisone and viscosupplements, and home exercise program, but remains in significant pain with significant dysfunction.Radiographs show bone on bone arthritis medial and patellofemoral. She presents now for right Total Knee Arthroplasty.   Laboratory Data: Admission on 12/29/2012  Component Date Value Range Status  . ABO/RH(D) 12/29/2012 B POS   Final  . Antibody Screen 12/29/2012 NEG   Final  . Sample Expiration 12/29/2012 01/01/2013   Final  . ABO/RH(D) 12/29/2012 B POS   Final  . WBC 12/30/2012 7.1  4.0 - 10.5 K/uL Final  . RBC 12/30/2012  4.02  3.87 - 5.11 MIL/uL Final  . Hemoglobin 12/30/2012 11.8* 12.0 - 15.0 g/dL Final  . HCT 14/78/2956 35.9* 36.0 - 46.0 % Final  . MCV 12/30/2012 89.3  78.0 - 100.0 fL Final  . MCH 12/30/2012 29.4  26.0 - 34.0 pg Final  . MCHC 12/30/2012 32.9  30.0 - 36.0 g/dL Final  . RDW 21/30/8657 13.5  11.5 - 15.5 % Final  . Platelets 12/30/2012 162  150 - 400 K/uL Final  . Sodium 12/30/2012 138  135 - 145 mEq/L Final  . Potassium 12/30/2012 4.5  3.5 - 5.1 mEq/L Final  . Chloride 12/30/2012 106  96 - 112 mEq/L Final  . CO2 12/30/2012 27  19 - 32 mEq/L Final  . Glucose, Bld 12/30/2012 137* 70 - 99 mg/dL Final  . BUN 84/69/6295 10  6 - 23 mg/dL Final  . Creatinine, Ser 12/30/2012 0.63  0.50 - 1.10 mg/dL Final  . Calcium 28/41/3244 9.5  8.4 - 10.5 mg/dL Final  . GFR calc non Af Amer 12/30/2012 87* >90 mL/min Final  . GFR calc Af Amer 12/30/2012 >90  >90 mL/min Final   Comment:                                 The eGFR has been calculated  using the CKD EPI equation.                          This calculation has not been                          validated in all clinical                          situations.                          eGFR's persistently                          <90 mL/min signify                          possible Chronic Kidney Disease.  . WBC 12/31/2012 10.3  4.0 - 10.5 K/uL Final  . RBC 12/31/2012 3.94  3.87 - 5.11 MIL/uL Final  . Hemoglobin 12/31/2012 12.2  12.0 - 15.0 g/dL Final  . HCT 16/04/9603 34.9* 36.0 - 46.0 % Final  . MCV 12/31/2012 88.6  78.0 - 100.0 fL Final  . MCH 12/31/2012 31.0  26.0 - 34.0 pg Final  . MCHC 12/31/2012 35.0  30.0 - 36.0 g/dL Final  . RDW 54/03/8118 13.6  11.5 - 15.5 % Final  . Platelets 12/31/2012 169  150 - 400 K/uL Final  . Sodium 12/31/2012 136  135 - 145 mEq/L Final  . Potassium 12/31/2012 3.5  3.5 - 5.1 mEq/L Final   Comment: DELTA CHECK NOTED                          REPEATED TO VERIFY  . Chloride 12/31/2012 106  96 -  112 mEq/L Final  . CO2 12/31/2012 26  19 - 32 mEq/L Final  . Glucose, Bld 12/31/2012 204* 70 - 99 mg/dL Final  . BUN 14/78/2956 7  6 - 23 mg/dL Final  . Creatinine, Ser 12/31/2012 0.56  0.50 - 1.10 mg/dL Final  . Calcium 21/30/8657 10.0  8.4 - 10.5 mg/dL Final  . GFR calc non Af Amer 12/31/2012 >90  >90 mL/min Final  . GFR calc Af Amer 12/31/2012 >90  >90 mL/min Final   Comment:                                 The eGFR has been calculated                          using the CKD EPI equation.                          This calculation has not been                          validated in all clinical                          situations.  eGFR's persistently                          <90 mL/min signify                          possible Chronic Kidney Disease.  . WBC 01/01/2013 10.7* 4.0 - 10.5 K/uL Final  . RBC 01/01/2013 3.74* 3.87 - 5.11 MIL/uL Final  . Hemoglobin 01/01/2013 11.0* 12.0 - 15.0 g/dL Final  . HCT 16/04/9603 33.3* 36.0 - 46.0 % Final  . MCV 01/01/2013 89.0  78.0 - 100.0 fL Final  . MCH 01/01/2013 29.4  26.0 - 34.0 pg Final  . MCHC 01/01/2013 33.0  30.0 - 36.0 g/dL Final  . RDW 54/03/8118 14.2  11.5 - 15.5 % Final  . Platelets 01/01/2013 194  150 - 400 K/uL Final  Hospital Outpatient Visit on 12/18/2012  Component Date Value Range Status  . MRSA, PCR 12/18/2012 NEGATIVE  NEGATIVE Final  . Staphylococcus aureus 12/18/2012 NEGATIVE  NEGATIVE Final   Comment:                                 The Xpert SA Assay (FDA                          approved for NASAL specimens                          in patients over 61 years of age),                          is one component of                          a comprehensive surveillance                          program.  Test performance has                          been validated by [employer] for patients greater                          than or equal to 34 year old.                           It is not intended                          to diagnose infection nor to                          guide or monitor treatment.  Marland Kitchen aPTT 12/18/2012 31  24 - 37 seconds Final  . WBC 12/18/2012 5.4  4.0 - 10.5 K/uL Final  . RBC 12/18/2012 4.64  3.87 - 5.11 MIL/uL Final  . Hemoglobin 12/18/2012 13.9  12.0 - 15.0 g/dL Final  . HCT 14/78/2956 40.8  36.0 - 46.0 % Final  . MCV 12/18/2012 87.9  78.0 - 100.0 fL Final  . MCH 12/18/2012 30.0  26.0 - 34.0 pg Final  . MCHC 12/18/2012 34.1  30.0 - 36.0 g/dL Final  . RDW 16/04/9603 13.7  11.5 - 15.5 % Final  . Platelets 12/18/2012 170  150 - 400 K/uL Final  . Sodium 12/18/2012 136  135 - 145 mEq/L Final  . Potassium 12/18/2012 3.9  3.5 - 5.1 mEq/L Final  . Chloride 12/18/2012 102  96 - 112 mEq/L Final  . CO2 12/18/2012 27  19 - 32 mEq/L Final  . Glucose, Bld 12/18/2012 105* 70 - 99 mg/dL Final  . BUN 54/03/8118 14  6 - 23 mg/dL Final  . Creatinine, Ser 12/18/2012 0.61  0.50 - 1.10 mg/dL Final  . Calcium 14/78/2956 10.9* 8.4 - 10.5 mg/dL Final  . Total Protein 12/18/2012 6.4  6.0 - 8.3 g/dL Final  . Albumin 21/30/8657 3.3* 3.5 - 5.2 g/dL Final  . AST 84/69/6295 19  0 - 37 U/L Final  . ALT 12/18/2012 22  0 - 35 U/L Final  . Alkaline Phosphatase 12/18/2012 87  39 - 117 U/L Final  . Total Bilirubin 12/18/2012 0.4  0.3 - 1.2 mg/dL Final  . GFR calc non Af Amer 12/18/2012 88* >90 mL/min Final  . GFR calc Af Amer 12/18/2012 >90  >90 mL/min Final   Comment:                                 The eGFR has been calculated                          using the CKD EPI equation.                          This calculation has not been                          validated in all clinical                          situations.                          eGFR's persistently                          <90 mL/min signify                          possible Chronic Kidney Disease.  Marland Kitchen Prothrombin Time 12/18/2012 12.4  11.6 - 15.2 seconds Final  . INR 12/18/2012 0.93  0.00 -  1.49 Final  . Color, Urine 12/18/2012 YELLOW  YELLOW Final  . APPearance 12/18/2012 CLEAR  CLEAR Final  . Specific Gravity, Urine 12/18/2012 1.021  1.005 - 1.030 Final  . pH 12/18/2012 5.5  5.0 - 8.0 Final  . Glucose, UA 12/18/2012 NEGATIVE  NEGATIVE mg/dL Final  . Hgb urine dipstick 12/18/2012 NEGATIVE  NEGATIVE Final  . Bilirubin Urine 12/18/2012 NEGATIVE  NEGATIVE Final  . Ketones, ur 12/18/2012 NEGATIVE  NEGATIVE mg/dL Final  . Protein, ur 28/41/3244 NEGATIVE  NEGATIVE mg/dL Final  . Urobilinogen, UA 12/18/2012 0.2  0.0 - 1.0  mg/dL Final  . Nitrite 16/04/9603 NEGATIVE  NEGATIVE Final  . Leukocytes, UA 12/18/2012 NEGATIVE  NEGATIVE Final   MICROSCOPIC NOT DONE ON URINES WITH NEGATIVE PROTEIN, BLOOD, LEUKOCYTES, NITRITE, OR GLUCOSE <1000 mg/dL.     X-Rays:Dg Chest 2 View  12/18/2012   *RADIOLOGY REPORT*  Clinical Data: Preop for right knee replacement  CHEST - 2 VIEW  Comparison: 08/15/2005  Findings: Cardiomediastinal silhouette is stable.  No acute infiltrate or pleural effusion.  No pulmonary edema.  Bony thorax is stable.  IMPRESSION: No active disease.  No significant change.   Original Report Authenticated By: Natasha Mead, M.D.    EKG:No orders found for this or any previous visit.   Hospital Course: [name] is a 10 y.o. who was admitted to [facility]. They were brought to the operating room on 12/29/2012 and underwent Procedure(s): RIGHT TOTAL KNEE ARTHROPLASTY.  Patient tolerated the procedure well and was later transferred to the recovery room and then to the orthopaedic floor for postoperative care.  They were given PO and IV analgesics for pain control following their surgery.  They were given 24 hours of postoperative antibiotics of  Anti-infectives   Start     Dose/Rate Route Frequency Ordered Stop   12/29/12 1600  ceFAZolin (ANCEF) IVPB 1 g/50 mL premix     1 g 100 mL/hr over 30 Minutes Intravenous Every 6 hours 12/29/12 1334 12/29/12 2306   12/29/12 0645   ceFAZolin (ANCEF) IVPB 2 g/50 mL premix     2 g 100 mL/hr over 30 Minutes Intravenous On call to O.R. 12/29/12 5409 12/29/12 0926     and started on DVT prophylaxis in the form of Xarelto.   PT and OT were ordered for total joint protocol.  Discharge planning consulted to help with postop disposition and equipment needs.  Patient had a rough night on the evening of surgery.  They started to get up OOB with therapy on day one and walked about 10 feet. Hemovac drain was pulled without difficulty.  Continued to work with therapy into day two walking 40 feet and then 100 feet.  Dressing was changed on day two and the incision was healing well.  By day three, the patient had progressed with therapy and meeting their goals.  Incision was healing well.  Patient was seen in rounds and was ready to go [facility] for continued care.   Discharge Medications: Prior to Admission medications   Medication Sig Start Date End Date Taking? Authorizing Provider  furosemide (LASIX) 20 MG tablet Take 20 mg by mouth every morning.   Yes Historical Provider, MD  levothyroxine (SYNTHROID, LEVOTHROID) 75 MCG tablet Take 75 mcg by mouth as directed. Monday-saturday   Yes Historical Provider, MD  meclizine (ANTIVERT) 25 MG tablet Take 25 mg by mouth every 6 (six) hours as needed for dizziness.   Yes Historical Provider, MD  olmesartan (BENICAR) 20 MG tablet Take 20 mg by mouth every morning.   Yes Historical Provider, MD  pantoprazole (PROTONIX) 40 MG tablet Take 40 mg by mouth daily.   Yes Historical Provider, MD  polyethylene glycol (MIRALAX / GLYCOLAX) packet Take 17 g by mouth daily.   Yes Historical Provider, MD  rosuvastatin (CRESTOR) 10 MG tablet Take 10 mg by mouth every evening.   Yes Historical Provider, MD  bisacodyl (DULCOLAX) 10 MG suppository Place 1 suppository (10 mg total) rectally daily as needed. 01/01/13   Treylan Mcclintock, PA-C  diphenhydrAMINE (BENADRYL) 12.5 MG/5ML elixir Take 5-10  mLs (12.5-25 mg  total) by mouth every 4 (four) hours as needed for itching. 01/01/13   Kennidee Heyne, PA-C  docusate sodium 100 MG CAPS Take 100 mg by mouth 2 (two) times daily. 01/01/13   Nuria Phebus Julien Girt, PA-C  methocarbamol (ROBAXIN) 500 MG tablet Take 1 tablet (500 mg total) by mouth every 6 (six) hours as needed. 01/01/13   Caden Fatica Julien Girt, PA-C  metoCLOPramide (REGLAN) 5 MG tablet Take 1-2 tablets (5-10 mg total) by mouth every 8 (eight) hours as needed (if ondansetron (ZOFRAN) ineffective.). 01/01/13   Ousmane Seeman, PA-C  ondansetron (ZOFRAN) 4 MG tablet Take 1 tablet (4 mg total) by mouth every 6 (six) hours as needed for nausea. 01/01/13   Zaire Vanbuskirk, PA-C  oxyCODONE (OXY IR/ROXICODONE) 5 MG immediate release tablet Take 1-2 tablets (5-10 mg total) by mouth every 3 (three) hours as needed. 01/01/13   Cope Marte Julien Girt, PA-C  rivaroxaban (XARELTO) 10 MG TABS tablet Take 1 tablet (10 mg total) by mouth daily with breakfast. Take Xarelto for two and a half more weeks, then discontinue Xarelto. Once the patient has completed the Xarelto, they may resume the 81 mg Aspirin. 01/01/13   Americus Scheurich Julien Girt, PA-C    Diet: Cardiac diet Activity:WBAT Follow-up:in 2 weeks Disposition - Skilled nursing facility - [facility] Discharged Condition: good   Discharge Orders   Future Orders Complete By Expires     Call MD / Call 911  As directed     Comments:      If you experience chest pain or shortness of breath, CALL 911 and be transported to the hospital emergency room.  If you develope a fever above 101 F, pus (white drainage) or increased drainage or redness at the wound, or calf pain, call your surgeon's office.    Change dressing  As directed     Comments:      Change dressing daily with sterile 4 x 4 inch gauze dressing and apply TED hose. Do not submerge the incision under water.    Constipation Prevention  As directed     Comments:      Drink plenty of fluids.  Prune juice may be  helpful.  You may use a stool softener, such as Colace (over the counter) 100 mg twice a day.  Use MiraLax (over the counter) for constipation as needed.    Diet - low sodium heart healthy  As directed     Discharge instructions  As directed     Comments:      Pick up stool softner and laxative for home. Do not submerge incision under water. May shower. Continue to use ice for pain and swelling from surgery.  Take Xarelto for two and a half more weeks, then discontinue Xarelto. Once the patient has completed the Xarelto, they may resume the 81 mg Aspirin.  When discharged from the skilled rehab facility, please have the facility set up the patient's Home Health Physical Therapy prior to being released.  Also provide the patient with their medications at time of release from the facility to include their pain medication, the muscle relaxants, and their blood thinner medication.  If the patient is still at the rehab facility at time of follow up appointment, please also assist the patient in arranging follow up appointment in our office and any transportation needs.    Do not put a pillow under the knee. Place it under the heel.  As directed     Do not sit on low chairs, stoools  or toilet seats, as it may be difficult to get up from low surfaces  As directed     Driving restrictions  As directed     Comments:      No driving until released by the physician.    Increase activity slowly as tolerated  As directed     Lifting restrictions  As directed     Comments:      No lifting until released by the physician.    Patient may shower  As directed     Comments:      You may shower without a dressing once there is no drainage.  Do not wash over the wound.  If drainage remains, do not shower until drainage stops.    TED hose  As directed     Comments:      Use stockings (TED hose) for 3 weeks on both leg(s).  You may remove them at night for sleeping.    Weight bearing as tolerated  As directed          Medication List    STOP taking these medications       aspirin EC 81 MG tablet     calcium-vitamin D 500-200 MG-UNIT per tablet  Commonly known as:  OSCAL WITH D     multivitamin with minerals Tabs      TAKE these medications       bisacodyl 10 MG suppository  Commonly known as:  DULCOLAX  Place 1 suppository (10 mg total) rectally daily as needed.     diphenhydrAMINE 12.5 MG/5ML elixir  Commonly known as:  BENADRYL  Take 5-10 mLs (12.5-25 mg total) by mouth every 4 (four) hours as needed for itching.     DSS 100 MG Caps  Take 100 mg by mouth 2 (two) times daily.     furosemide 20 MG tablet  Commonly known as:  LASIX  Take 20 mg by mouth every morning.     levothyroxine 75 MCG tablet  Commonly known as:  SYNTHROID, LEVOTHROID  Take 75 mcg by mouth as directed. Monday-saturday     meclizine 25 MG tablet  Commonly known as:  ANTIVERT  Take 25 mg by mouth every 6 (six) hours as needed for dizziness.     methocarbamol 500 MG tablet  Commonly known as:  ROBAXIN  Take 1 tablet (500 mg total) by mouth every 6 (six) hours as needed.     metoCLOPramide 5 MG tablet  Commonly known as:  REGLAN  Take 1-2 tablets (5-10 mg total) by mouth every 8 (eight) hours as needed (if ondansetron (ZOFRAN) ineffective.).     olmesartan 20 MG tablet  Commonly known as:  BENICAR  Take 20 mg by mouth every morning.     ondansetron 4 MG tablet  Commonly known as:  ZOFRAN  Take 1 tablet (4 mg total) by mouth every 6 (six) hours as needed for nausea.     oxyCODONE 5 MG immediate release tablet  Commonly known as:  Oxy IR/ROXICODONE  Take 1-2 tablets (5-10 mg total) by mouth every 3 (three) hours as needed.     pantoprazole 40 MG tablet  Commonly known as:  PROTONIX  Take 40 mg by mouth daily.     polyethylene glycol packet  Commonly known as:  MIRALAX / GLYCOLAX  Take 17 g by mouth daily.     rivaroxaban 10 MG Tabs tablet  Commonly known as:  XARELTO  Take 1 tablet (10 mg  total)  by mouth daily with breakfast. Take Xarelto for two and a half more weeks, then discontinue Xarelto.  Once the patient has completed the Xarelto, they may resume the 81 mg Aspirin.     rosuvastatin 10 MG tablet  Commonly known as:  CRESTOR  Take 10 mg by mouth every evening.           Follow-up Information   Follow up with Loanne Drilling, MD. Schedule an appointment as soon as possible for a visit in 2 weeks.   Contact information:   [address], [address] [place] [address] [phone]       Signed: Patrica Duel 01/01/2013, 7:54 AM

## 2013-01-01 NOTE — Progress Notes (Signed)
Clinical Social Work Department CLINICAL SOCIAL WORK PLACEMENT NOTE 01/01/2013  Patient:  [name], [name]  Account Number:  [id number] Admit date:  12/29/2012  Clinical Social Worker:  Cori Razor, LCSW  Date/time:  12/30/2012 08:55 AM  Clinical Social Work is seeking post-discharge placement for this patient at the following level of care:   SKILLED NURSING   (*CSW will update this form in Epic as items are completed)     Patient/family provided with [facility] Department of Clinical Social Work's list of facilities offering this level of care within the geographic area requested by the patient (or if unable, by the patient's family).  12/30/2012  Patient/family informed of their freedom to choose among providers that offer the needed level of care, that participate in Medicare, Medicaid or managed care program needed by the patient, have an available bed and are willing to accept the patient.    Patient/family informed of [facility]' ownership interest in [facility], as well as of the fact that they are under no obligation to receive care at this facility.  PASARR submitted to [employer] on 12/29/2012 PASARR number received from [employer] on   FL2 transmitted to all facilities in geographic area requested by pt/family on  12/30/2012 FL2 transmitted to all facilities within larger geographic area on   Patient informed that his/her managed care company has contracts with or will negotiate with  certain facilities, including the following:     Patient/family informed of bed offers received:  12/30/2012 Patient chooses bed at [facility] Physician recommends and patient chooses bed at    Patient to be transferred to [facility] on  01/01/2013 Patient to be transferred to facility by [facility]  The following physician request were entered in Epic:   Additional Comments:  Cori Razor LCSW [phone]

## 2013-05-15 ENCOUNTER — Ambulatory Visit (INDEPENDENT_AMBULATORY_CARE_PROVIDER_SITE_OTHER): Payer: Medicare Other | Admitting: Neurology

## 2013-05-15 ENCOUNTER — Encounter: Payer: Self-pay | Admitting: Neurology

## 2013-05-15 ENCOUNTER — Encounter (INDEPENDENT_AMBULATORY_CARE_PROVIDER_SITE_OTHER): Payer: Self-pay

## 2013-05-15 VITALS — BP 147/82 | HR 89 | Ht 63.5 in | Wt 218.0 lb

## 2013-05-15 DIAGNOSIS — R42 Dizziness and giddiness: Secondary | ICD-10-CM

## 2013-05-15 NOTE — Patient Instructions (Signed)
Overall you are doing fairly well but I do want to suggest a few things today:   Remember to drink plenty of fluid, eat healthy meals and do not skip any meals. Try to eat protein with a every meal and eat a healthy snack such as fruit or nuts in between meals. Try to keep a regular sleep-wake schedule and try to exercise daily, particularly in the form of walking, 20-30 minutes a day, if you can.   We will place a referral for you to be evaluated by vestibular rehab.  My clinical assistant and will answer any of your questions and relay your messages to me and also relay most of my messages to you.   Our phone number is [phone]. We also have an after hours call service for urgent matters and there is a physician on-call for urgent questions. For any emergencies you know to call 911 or go to the nearest emergency room

## 2013-05-15 NOTE — Progress Notes (Signed)
[facility]    Provider:  Dr Hosie Poisson Referring Provider: Laurena Slimmer, MD Primary Care Physician:  Laurena Slimmer, MD  CC:  Dizzy spells  HPI:  [name] is a 72 y.o. female here as a referral from Dr. Chestine Spore for dizzy spells  Started around 1 month ago. Only notes when in bed when neck extended back or anytime she extends head/neck back. Notes sensation of room spinning with these episodes. Lasts a few seconds then goes away on its own. Feels lightheaded with turning head to right or left but no vertigo sensation. Mild nausea with episodes. No vomiting. No falls, one close call when she tilted her head back. No fullness or tinnitus in her ears. No difficulties walking. No headache. No visual changes. Started on meclizine by PCP with some benefit.   Has history of HTN. No prior stroke or mini-stroke.   Review of Systems: Out of a complete 14 system review, the patient complains of only the following symptoms, and all other reviewed systems are negative. For swelling in legs fatigue weight gain dizziness snoring decreased energy  History   Social History  . Marital Status: Married    Spouse Name: N/A    Number of Children: 4  . Years of Education: 12   Occupational History  . Retired    Social History Main Topics  . Smoking status: Never Smoker   . Smokeless tobacco: Never Used  . Alcohol Use: Yes     Comment: OCCAS ALCOLHOL  . Drug Use: No  . Sexual Activity: Not on file   Other Topics Concern  . Not on file   Social History Narrative   [name]   Patient is retired.    Patient has 4 children.    Patient has a high school education.    Patient is married Chief Strategy Officer Sr. He lives in a nursing home.    No family history on file.  Past Medical History  Diagnosis Date  . Hypertension   . Elevated cholesterol   . GERD (gastroesophageal reflux disease)   . IBS (irritable bowel syndrome)   . Hypothyroidism   . Arthritis      RIGHT KNEE PAIN AND OA  . Swelling     CHRONIC SWELLING RIGHT HAND AND BOTH FEET AND BOTH LEGS  . Rotator cuff disorder     BOTH SHOULDERS - NO SURGERY - AND STATES LIMITATIONS IN ARM MOVEMENT  . Vertigo     Past Surgical History  Procedure Laterality Date  . Cholecystectomy    . Abdominal hysterectomy    . Hemorrhoid surgery    . Breast surgery      RIGHT BREAST BIOPSY - BENIGN  . Eye surgery      BILATERAL CATARACT EXTRACTIONS; SURGERY FOR MACULAR HOLE   . 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 Aspirin (ECOTRIN PO) Take 80 mg by mouth daily.      . Calcium-Magnesium-Vitamin D (CALCIUM 1200+D3 PO) Take 1,000 Units by mouth daily.      . furosemide (LASIX) 20 MG tablet Take 20 mg by mouth every morning.      Marland Kitchen levothyroxine (SYNTHROID, LEVOTHROID) 75 MCG tablet Take 75 mcg by mouth as directed. Monday-saturday      . meclizine (ANTIVERT) 25  MG tablet Take 25 mg by mouth every 6 (six) hours as needed for dizziness.      . Multiple Vitamins-Minerals (CENTRUM SILVER PO) Take by mouth.      . olmesartan (BENICAR) 20 MG tablet Take 20 mg by mouth every morning.      . pantoprazole (PROTONIX) 40 MG tablet Take 40 mg by mouth daily.      . polyethylene glycol (MIRALAX / GLYCOLAX) packet Take 17 g by mouth daily.      . prochlorperazine (COMPAZINE) 10 MG tablet       . rosuvastatin (CRESTOR) 10 MG tablet Take 10 mg by mouth every evening.       No current facility-administered medications for this visit.    Allergies as of 05/15/2013 - Review Complete 12/29/2012  Allergen Reaction Noted  . Codeine Nausea And Vomiting 12/18/2012  . Macrobid [nitrofurantoin macrocrystal]  12/18/2012  . Tramadol Nausea And Vomiting 12/18/2012    Vitals: BP 147/82  Pulse 89  Ht 5' 3.5"  (1.613 m)  Wt 218 lb (98.884 kg)  BMI 38.01 kg/m2 Last Weight:  Wt Readings from Last 1 Encounters:  05/15/13 218 lb (98.884 kg)   Last Height:   Ht Readings from Last 1 Encounters:  05/15/13 5' 3.5" (1.613 m)     Physical exam: Exam: Gen: NAD, conversant Eyes: anicteric sclerae, moist conjunctivae HENT: Atraumatic, oropharynx clear Neck: Trachea midline; supple,  Lungs: CTA, no wheezing, rales, rhonic                          CV: RRR, no MRG Abdomen: Soft, non-tender;  Extremities: No peripheral edema  Skin: Normal temperature, no rash,  Psych: Appropriate affect, pleasant  Neuro: MS: AA&Ox3, appropriately interactive, normal affect   Speech: fluent w/o paraphasic error  Memory: good recent and remote recall  CN: PERRL, EOMI no nystagmus, no ptosis, sensation intact to LT V1-V3 bilat, face symmetric, no weakness, hearing grossly intact, palate elevates symmetrically, shoulder shrug 5/5 bilat,  tongue protrudes midline, no fasiculations noted. -With extension of neck after brief delay had a sensation of vertigo, there was no noted nystagmus with horizontal gaze to right or left  Motor: normal bulk and tone Strength: 5/5  In all extremities  Coord: rapid alternating and point-to-point (FNF, HTS) movements intact.  Reflexes: symmetrical, bilat downgoing toes  Sens: LT intact in all extremities  Gait: posture, stance, stride and arm-swing normal. Tandem gait intact. Able to walk on heels and toes. Romberg absent.   Assessment:  After physical and neurologic examination, review of laboratory studies, imaging, neurophysiology testing and pre-existing records, assessment will be reviewed on the problem list.  Plan:  Treatment plan and additional workup will be reviewed under Problem List.  1)Vertigo  72 year old woman presenting for initial evaluation of episodes of vertigo associated with extension of her head. Otherwise she is asymptomatic. Physical exam overall  unremarkable, extension of the neck dated trigger episodes of vertigo which resolved with forward flexion of neck. Suspect these episodes likely represent benign positional vertigo. Will refer patient to vestibular rehabilitation. Will continue patient on meclizine for now. If no improvement after rehabilitation would consider MRA to rule out vascular occlusion. Followup in 2-3 months after rehabilitation.

## 2013-05-21 ENCOUNTER — Ambulatory Visit: Payer: Medicare Other | Attending: Neurology | Admitting: Physical Therapy

## 2013-05-21 DIAGNOSIS — R269 Unspecified abnormalities of gait and mobility: Secondary | ICD-10-CM | POA: Insufficient documentation

## 2013-05-21 DIAGNOSIS — R42 Dizziness and giddiness: Secondary | ICD-10-CM | POA: Insufficient documentation

## 2013-05-21 DIAGNOSIS — IMO0001 Reserved for inherently not codable concepts without codable children: Secondary | ICD-10-CM | POA: Insufficient documentation

## 2013-05-26 ENCOUNTER — Ambulatory Visit: Payer: Medicare Other | Admitting: Physical Therapy

## 2013-06-29 ENCOUNTER — Ambulatory Visit: Payer: Medicare Other | Attending: Neurology | Admitting: Physical Therapy

## 2013-06-29 DIAGNOSIS — IMO0001 Reserved for inherently not codable concepts without codable children: Secondary | ICD-10-CM | POA: Insufficient documentation

## 2013-06-29 DIAGNOSIS — R269 Unspecified abnormalities of gait and mobility: Secondary | ICD-10-CM | POA: Insufficient documentation

## 2013-06-29 DIAGNOSIS — R42 Dizziness and giddiness: Secondary | ICD-10-CM | POA: Insufficient documentation

## 2013-07-14 ENCOUNTER — Ambulatory Visit: Payer: Medicare Other | Attending: Neurology | Admitting: Physical Therapy

## 2013-07-14 DIAGNOSIS — R42 Dizziness and giddiness: Secondary | ICD-10-CM | POA: Insufficient documentation

## 2013-07-14 DIAGNOSIS — R269 Unspecified abnormalities of gait and mobility: Secondary | ICD-10-CM | POA: Insufficient documentation

## 2013-07-14 DIAGNOSIS — IMO0001 Reserved for inherently not codable concepts without codable children: Secondary | ICD-10-CM | POA: Insufficient documentation

## 2013-07-27 ENCOUNTER — Ambulatory Visit: Payer: Medicare Other | Admitting: Neurology

## 2013-12-10 ENCOUNTER — Encounter: Payer: Self-pay | Admitting: *Deleted

## 2013-12-10 ENCOUNTER — Encounter: Payer: Medicare Other | Attending: Internal Medicine | Admitting: *Deleted

## 2013-12-10 DIAGNOSIS — Z713 Dietary counseling and surveillance: Secondary | ICD-10-CM | POA: Insufficient documentation

## 2013-12-10 DIAGNOSIS — E669 Obesity, unspecified: Secondary | ICD-10-CM | POA: Insufficient documentation

## 2013-12-10 NOTE — Progress Notes (Signed)
  Medical Nutrition Therapy:  Appt start time: 1100 end time:  1200.  Assessment:  Primary concerns today: [name] is here for nutrition counseling pertaining to obesity.  She has weighed between 200-215.    Her referral also indicated hyperlipidemia and GERD.  [name] reports that the Zantac is really helpful and she's not having any problems with her reflux as of late.  Labs from April indicate normal lipid levels. [name] lives alone and does her own shopping and cooking.  She might cook once a week and eat the leftovers.  She might go out maybe once a week.  She has a lot of wrong information about nutrition from the media.  She mostly eats in the kitchen at the table.  Rarely eats in the den or bedroom watching tv.  She thinks she's a fast eater.  She thinks she eats vegetables 5 days a week and fruit maybe 2-3 times.  She might eat cheese sometimes, but stopped eating yogurt.  She has been advised to limit carbohydrates. He husband is in a nursing home and that keeps her busy   Preferred Learning Style:   No preference indicated   Learning Readiness:   Ready   MEDICATIONS: see list   DIETARY INTAKE:  Usual eating pattern includes 3 meals and 2-3 snacks per day.  Everyday foods include starches, lean proteins, sometimes vegetables.  Avoided foods include tries to limit carbohydrates.    24-hr recall:  B ( AM): 1 slice cheese toast (wheat currently, but wants to go back white) and light grape juice .  Also has bacon with buttered toast every other day Snk ( AM): not usually  L ( PM): salmon salad.  Sometimes crackers and cream cheese.  Like plain tortilla chips.  Sometimes might have hamburger Snk ( PM): tortilla chips D ( PM): baked chicken, brown rice and green vegetable.  Sometimes mixed vegetables,.  Sometimes fried chicken, but mostly bakes her foods.  Sometimes meatloaf or pork chops or veal or lamb Snk ( PM): light ice cream and cookies.  Sometimes kit kat candy bars Beverages:  koolaid or soda sometimes.  Juice in the morning.  Drinks 4 bottle water a day  Usual physical activity: water aerobics 3-4 times/week  Estimated energy needs: 1600 calories 180 g carbohydrates 120 g protein 44 g fat    Nutritional Diagnosis:  NB-1.1 Food and nutrition-related knowledge deficit As related to proper balance of maco- and nirconutrients for a health lifestyle.  As evidenced by patient self-report.    Intervention:  Nutrition counseling provided.  Reviewed lab data with patient: normal lipids and glucose.  Abnormal PTH, Hg, and Ca.  Suggested talking with provider about her synthroid dose and her PTH levels   Goals:  Continue 3 meals a day Aim for vegetables 2 times a day and fruit 1 time a day Bring back yogurt Limit sugary beverages like juice and koolaid Continue water aerobics Continue to eat meal at the table Aim to make meals last 20 minutes- slow down.  Take sips of water in between  Try to walk 10 minutes most days.    Teaching Method Utilized:  Auditory   Barriers to learning/adherence to lifestyle change: none  Demonstrated degree of understanding via:  Teach Back   Monitoring/Evaluation:  Dietary intake, exercise, lab resports, and body weight prn.

## 2013-12-10 NOTE — Patient Instructions (Addendum)
Continue 3 meals a day Aim for vegetables 2 times a day and fruit 1 time a day Bring back yogurt Limit sugary beverages like juice and koolaid Continue water aerobics Continue to eat meal at the table Aim to make meals last 20 minutes- slow down.  Take sips of water in between  Try to walk 10 minutes most days.

## 2014-01-06 ENCOUNTER — Ambulatory Visit (INDEPENDENT_AMBULATORY_CARE_PROVIDER_SITE_OTHER): Payer: Medicare Other | Admitting: Neurology

## 2014-01-06 ENCOUNTER — Encounter: Payer: Self-pay | Admitting: Neurology

## 2014-01-06 ENCOUNTER — Ambulatory Visit: Payer: PRIVATE HEALTH INSURANCE | Attending: Neurology | Admitting: Physical Therapy

## 2014-01-06 ENCOUNTER — Other Ambulatory Visit: Payer: Self-pay | Admitting: Neurology

## 2014-01-06 VITALS — BP 143/77 | HR 73 | Ht 63.0 in | Wt 220.0 lb

## 2014-01-06 DIAGNOSIS — R42 Dizziness and giddiness: Secondary | ICD-10-CM

## 2014-01-06 DIAGNOSIS — G450 Vertebro-basilar artery syndrome: Secondary | ICD-10-CM

## 2014-01-06 DIAGNOSIS — H8110 Benign paroxysmal vertigo, unspecified ear: Secondary | ICD-10-CM | POA: Insufficient documentation

## 2014-01-06 DIAGNOSIS — IMO0001 Reserved for inherently not codable concepts without codable children: Secondary | ICD-10-CM | POA: Insufficient documentation

## 2014-01-06 MED ORDER — ONDANSETRON 4 MG PO TBDP: 4.0000 mg | ORAL_TABLET | Freq: Three times a day (TID) | ORAL | Status: DC | PRN

## 2014-01-06 NOTE — Progress Notes (Signed)
[facility]    Provider:  Dr Janann Colonel Referring Provider: Foye Spurling, MD Primary Care Physician:  Foye Spurling, MD  CC:  Dizzy spells  HPI:  [name] is a 73 y.o. female here as a follow up from Dr. Carlis Abbott for dizzy spells. Last visit was 05/2013 at which time she was referred to vestibular rehab for possible benign positional vertigo. Since last visit she has done the vestibular rehab which improves the symptoms for a few months and then it returns. Notes similar symptoms as prior visit but with new occurrence of brief floaters. Not currently taking meclizine.   Has HTN. No DM. No prior strokes or TIAs.   Initial visit 05/2013: Started around 1 month ago. Only notes when in bed when neck extended back or anytime she extends head/neck back. Notes sensation of room spinning with these episodes. Lasts a few seconds then goes away on its own. Feels lightheaded with turning head to right or left but no vertigo sensation. Mild nausea with episodes. No vomiting. No falls, one close call when she tilted her head back. No fullness or tinnitus in her ears. No difficulties walking. No headache. No visual changes. Started on meclizine by PCP with some benefit.   Has history of HTN. No prior stroke or mini-stroke.   Review of Systems: Out of a complete 14 system review, the patient complains of only the following symptoms, and all other reviewed systems are negative. + dizziness  History   Social History  . Marital Status: Married    Spouse Name: nathaniel    Number of Children: 4  . Years of Education: 12   Occupational History  . Retired    Social History Main Topics  . Smoking status: Never Smoker   . Smokeless tobacco: Never Used  . Alcohol Use: Yes     Comment: OCCAS ALCOLHOL  . Drug Use: No  . Sexual Activity: Not on file   Other Topics Concern  . Not on file   Social History Narrative   [name]   Patient is retired.    Patient has 4 children.    Patient has a high school education.    Patient is married Psychiatric nurse Sr. He lives in a nursing home.    Family History  Problem Relation Age of Onset  . Diabetes Mother   . Diabetes Father   . Diabetes Sister     Past Medical History  Diagnosis Date  . Hypertension   . Elevated cholesterol   . GERD (gastroesophageal reflux disease)   . IBS (irritable bowel syndrome)   . Hypothyroidism   . Arthritis     RIGHT KNEE PAIN AND OA  . Swelling     CHRONIC SWELLING RIGHT HAND AND BOTH FEET AND BOTH LEGS  . Rotator cuff disorder     BOTH SHOULDERS - NO SURGERY - AND STATES LIMITATIONS IN ARM MOVEMENT  . Vertigo     Past Surgical History  Procedure Laterality Date  . Cholecystectomy    . Abdominal hysterectomy    . Hemorrhoid surgery    . Breast surgery      RIGHT BREAST BIOPSY - BENIGN  . Eye surgery      BILATERAL CATARACT EXTRACTIONS; SURGERY FOR MACULAR HOLE   . 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 Aspirin (ECOTRIN PO) Take 80 mg by mouth daily.      . furosemide (LASIX) 20 MG tablet Take 20 mg by mouth every morning.      Marland Kitchen levothyroxine (SYNTHROID, LEVOTHROID) 75 MCG tablet Take 75 mcg by mouth as directed. Monday-saturday      . Multiple Vitamins-Minerals (CENTRUM SILVER PO) Take by mouth.      . olmesartan (BENICAR) 20 MG tablet Take 20 mg by mouth every morning.      . Pimecrolimus (ELIDEL EX) Apply topically as needed.      . polyethylene glycol (MIRALAX / GLYCOLAX) packet Take 17 g by mouth daily.      . prochlorperazine (COMPAZINE) 10 MG tablet       . ranitidine (ZANTAC) 150 MG tablet Take 150 mg by mouth 2 (two) times daily.      . rosuvastatin (CRESTOR) 10 MG tablet Take 10 mg by mouth every evening.       No current  facility-administered medications for this visit.    Allergies as of 01/06/2014 - Review Complete 01/06/2014  Allergen Reaction Noted  . Codeine Nausea And Vomiting 12/18/2012  . Macrobid [nitrofurantoin macrocrystal]  12/18/2012  . Tramadol Nausea And Vomiting 12/18/2012    Vitals: BP 143/77  Pulse 73  Ht 5\' 3"  (1.6 m)  Wt 220 lb (99.791 kg)  BMI 38.98 kg/m2 Last Weight:  Wt Readings from Last 1 Encounters:  01/06/14 220 lb (99.791 kg)   Last Height:   Ht Readings from Last 1 Encounters:  01/06/14 5\' 3"  (1.6 m)     Physical exam: Exam: Gen: NAD, conversant Eyes: anicteric sclerae, moist conjunctivae HENT: Atraumatic, oropharynx clear Neck: Trachea midline; supple,  Lungs: CTA, no wheezing, rales, rhonic                          CV: RRR, no MRG Abdomen: Soft, non-tender;  Extremities: No peripheral edema  Skin: Normal temperature, no rash,  Psych: Appropriate affect, pleasant  Neuro: MS: AA&Ox3, appropriately interactive, normal affect   Speech: fluent w/o paraphasic error  Memory: good recent and remote recall  CN: PERRL, EOMI no nystagmus, no ptosis, sensation intact to LT V1-V3 bilat, face symmetric, no weakness, hearing grossly intact, palate elevates symmetrically, shoulder shrug 5/5 bilat,  tongue protrudes midline, no fasiculations noted.  Motor: normal bulk and tone Strength: 5/5  In all extremities  Coord: rapid alternating and point-to-point (FNF, HTS) movements intact.  Reflexes: symmetrical, bilat downgoing toes  Sens: LT intact in all extremities  Gait: posture, stance, stride and arm-swing normal. Tandem gait intact. Able to walk on heels and toes. Romberg absent.   Assessment:  After physical and neurologic examination, review of laboratory studies, imaging, neurophysiology testing and pre-existing records, assessment will be reviewed on the problem list.  Plan:  Treatment plan and additional workup will be reviewed under Problem  List.  1)Vertigo  73 year old woman presenting for follow up evaluation of episodes of vertigo associated with extension of her head. Otherwise she is asymptomatic. Physical exam overall unremarkable, Still suspect these episodes likely represent benign positional vertigo. She did respond to vestibular rehab but symptoms returned. Based on chronic nature will check MRI/A to rule out structural or vascular structure. Follow up in 4 months.

## 2014-01-06 NOTE — Patient Instructions (Signed)
Overall you are doing fairly well but I do want to suggest a few things today:   Remember to drink plenty of fluid, eat healthy meals and do not skip any meals. Try to eat protein with a every meal and eat a healthy snack such as fruit or nuts in between meals. Try to keep a regular sleep-wake schedule and try to exercise daily, particularly in the form of walking, 20-30 minutes a day, if you can.   As far as diagnostic testing:  1)I would like you to have a MRI of the brain. You will be called to schedule this 2)Please follow up with vestibular rehab for further treatment  I would like to see you back in 4 months, sooner if we need to. Please call us with any interim questions, concerns, problems, updates or refill requests.   My clinical assistant and will answer any of your questions and relay your messages to me and also relay most of my messages to you.   Our phone number is [phone]. We also have an after hours call service for urgent matters and there is a physician on-call for urgent questions. For any emergencies you know to call 911 or go to the nearest emergency room

## 2014-01-07 ENCOUNTER — Ambulatory Visit: Payer: PRIVATE HEALTH INSURANCE | Admitting: Physical Therapy

## 2014-01-07 DIAGNOSIS — IMO0001 Reserved for inherently not codable concepts without codable children: Secondary | ICD-10-CM | POA: Diagnosis not present

## 2014-01-15 ENCOUNTER — Encounter: Payer: Medicare Other | Admitting: Physical Therapy

## 2014-01-19 ENCOUNTER — Encounter: Payer: Medicare Other | Admitting: Physical Therapy

## 2014-01-22 ENCOUNTER — Encounter: Payer: Medicare Other | Admitting: Physical Therapy

## 2014-01-25 ENCOUNTER — Ambulatory Visit
Admission: RE | Admit: 2014-01-25 | Discharge: 2014-01-25 | Disposition: A | Discharge status: Home / Self Care | Payer: Medicare Other | Source: Ambulatory Visit | Attending: Neurology | Admitting: Neurology

## 2014-01-25 DIAGNOSIS — G450 Vertebro-basilar artery syndrome: Secondary | ICD-10-CM

## 2014-01-25 DIAGNOSIS — R42 Dizziness and giddiness: Secondary | ICD-10-CM

## 2014-01-25 DIAGNOSIS — G459 Transient cerebral ischemic attack, unspecified: Secondary | ICD-10-CM

## 2014-01-25 MED ORDER — GADOBENATE DIMEGLUMINE 529 MG/ML IV SOLN
20.0000 mL | Freq: Once | INTRAVENOUS | Status: AC | PRN
  Administered 2014-01-25: 20 mL via INTRAVENOUS

## 2014-01-26 ENCOUNTER — Encounter: Payer: Medicare Other | Admitting: Physical Therapy

## 2014-01-29 ENCOUNTER — Telehealth: Payer: Self-pay | Admitting: *Deleted

## 2014-01-29 ENCOUNTER — Encounter: Payer: Medicare Other | Admitting: Physical Therapy

## 2014-01-29 NOTE — Telephone Encounter (Signed)
Message copied by Vivi Barrack on Fri Jan 29, 2014  8:37 AM ------      Message from: Drema Dallas      Created: Thu Jan 28, 2014  4:43 PM       Please let her know her MRI was overall unremarkable. Thanks. ------

## 2014-01-29 NOTE — Telephone Encounter (Signed)
Patient aware that her MRI was unremarkable.

## 2014-01-29 NOTE — Telephone Encounter (Signed)
Message copied by Vivi Barrack on Fri Jan 29, 2014  8:36 AM ------      Message from: Drema Dallas      Created: Thu Jan 28, 2014  4:16 PM       Please let her know her MRA was normal. Thanks. ------

## 2014-01-29 NOTE — Telephone Encounter (Signed)
Patient aware that her MRA was normal.

## 2014-02-17 ENCOUNTER — Ambulatory Visit: Payer: Medicare Other | Admitting: Neurology

## 2014-02-22 ENCOUNTER — Encounter: Payer: Self-pay | Admitting: Neurology

## 2014-05-11 ENCOUNTER — Ambulatory Visit: Payer: Medicare Other | Admitting: Neurology

## 2014-05-18 ENCOUNTER — Ambulatory Visit (INDEPENDENT_AMBULATORY_CARE_PROVIDER_SITE_OTHER): Payer: Medicare Other | Admitting: Neurology

## 2014-05-18 ENCOUNTER — Encounter: Payer: Self-pay | Admitting: Neurology

## 2014-05-18 VITALS — BP 115/66 | HR 70 | Temp 97.7°F | Ht 63.0 in | Wt 220.0 lb

## 2014-05-18 DIAGNOSIS — S15009A Unspecified injury of unspecified carotid artery, initial encounter: Secondary | ICD-10-CM | POA: Insufficient documentation

## 2014-05-18 DIAGNOSIS — R42 Dizziness and giddiness: Secondary | ICD-10-CM

## 2014-05-18 NOTE — Progress Notes (Addendum)
[facility]    Provider:  Dr Jaynee Eagles Referring Provider: Foye Spurling, MD Primary Care Physician:  Foye Spurling, MD  CC:  Vertigo, neck pain  HPI:  [name] is a 73 y.o. female here as a follow up for vertigo and new neck pain. In bed when she turned over she would have room spinning. She would get dizzy. She was shown the epley maneuver which helped. She has not had vertigo for 4-5 months. Just getting a little dizzy sometimes but otherwise feeling well.Vestibular rehab significantly helped.  She has pain which radiates in her right neck, unsure how long but probably started after the vertigo for the last few months. Possibly started with neck extension and vertiginous symptoms. Pain startes in the right ear and travels into the anterior portion of the neck, painful to palpation in the neck. Describes as tenderness.  Associated with dizzyness. She has spots in her vision occasionally without headaches. No blurry vision. No jaw pain or pain on chewing. No vision loss. No pain in the temples.    Reviewed notes, labs and imaging from outside physicians, which showed: Was seeing Dr. Janann Colonel for vertigo. Only noted when in bed when neck extended back or anytime she extends head/neck back. Noted sensation of room spinning with these episodes. Lasted a few seconds then goes away on its own. Personally reviewed images MRi of the brain which only showed non-specific white matter changes. recently had increased pth and increased calcium levels with nml urine calcium.   Review of Systems: Patient complains of symptoms per HPI as well as the following symptoms ear pain, runny nose, SOB, moles, difficulty walking. Pertinent negatives per HPI. All others negative.   History   Social History  . Marital Status: Married    Spouse Name: nathaniel    Number of Children: 4  . Years of Education: 12   Occupational History  . Retired    Social History Main Topics  . Smoking  status: Never Smoker   . Smokeless tobacco: Never Used  . Alcohol Use: Yes     Comment: OCCAS ALCOLHOL  . Drug Use: No  . Sexual Activity: Not on file   Other Topics Concern  . Not on file   Social History Narrative   [name]   Patient is retired.    Patient has 4 children.    Patient has a high school education.    Patient is married Psychiatric nurse Sr. He lives in a nursing home.    Family History  Problem Relation Age of Onset  . Diabetes Mother   . Diabetes Father   . Diabetes Sister     Past Medical History  Diagnosis Date  . Hypertension   . Elevated cholesterol   . GERD (gastroesophageal reflux disease)   . IBS (irritable bowel syndrome)   . Hypothyroidism   . Arthritis     RIGHT KNEE PAIN AND OA  . Swelling     CHRONIC SWELLING RIGHT HAND AND BOTH FEET AND BOTH LEGS  . Rotator cuff disorder     BOTH SHOULDERS - NO SURGERY - AND STATES LIMITATIONS IN ARM MOVEMENT  . Vertigo     Past Surgical History  Procedure Laterality Date  . Cholecystectomy    . Abdominal hysterectomy    . Hemorrhoid surgery    . Breast surgery      RIGHT BREAST BIOPSY - BENIGN  . Eye surgery      BILATERAL CATARACT  EXTRACTIONS; SURGERY FOR MACULAR HOLE   . 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 Aspirin (ECOTRIN PO) Take 80 mg by mouth daily.    . furosemide (LASIX) 20 MG tablet Take 20 mg by mouth every morning.    Marland Kitchen levothyroxine (SYNTHROID, LEVOTHROID) 75 MCG tablet Take 75 mcg by mouth as directed. Monday-saturday    . Multiple Vitamins-Minerals (CENTRUM SILVER PO) Take by mouth.    . olmesartan (BENICAR) 20 MG tablet Take 20 mg by mouth every morning.    . Pimecrolimus (ELIDEL EX) Apply topically as needed.    . polyethylene glycol (MIRALAX / GLYCOLAX)  packet Take 17 g by mouth daily.    . ranitidine (ZANTAC) 150 MG tablet Take 150 mg by mouth 2 (two) times daily.    . rosuvastatin (CRESTOR) 10 MG tablet Take 10 mg by mouth every evening.    . ondansetron (ZOFRAN-ODT) 4 MG disintegrating tablet Take 1 tablet (4 mg total) by mouth every 8 (eight) hours as needed for nausea. 30 tablet 3  . prochlorperazine (COMPAZINE) 10 MG tablet      No current facility-administered medications for this visit.    Allergies as of 05/18/2014 - Review Complete 05/18/2014  Allergen Reaction Noted  . Codeine Nausea And Vomiting 12/18/2012  . Macrobid [nitrofurantoin macrocrystal]  12/18/2012  . Tramadol Nausea And Vomiting 12/18/2012    Vitals: BP 115/66 mmHg  Pulse 70  Temp(Src) 97.7 F (36.5 C) (Oral)  Ht 5\' 3"  (1.6 m)  Wt 220 lb (99.791 kg)  BMI 38.98 kg/m2 Last Weight:  Wt Readings from Last 1 Encounters:  05/18/14 220 lb (99.791 kg)   Last Height:   Ht Readings from Last 1 Encounters:  05/18/14 5\' 3"  (1.6 m)    Physical exam: Exam: Gen: NAD, conversant, well nourised, well groomed                     CV: RRR, no MRG. No Carotid Bruits. No peripheral edema, warm, nontender Eyes: Conjunctivae clear without exudates or hemorrhage  Neuro: Detailed Neurologic Exam  Speech:    Speech is normal; fluent and spontaneous with normal comprehension.  Cognition:    The patient is oriented to person, place, and time;     recent and remote memory intact;     language fluent;     normal attention, concentration,     fund of knowledge Cranial Nerves:    The pupils are equal, round, and reactive to light. The fundi are flat. Visual fields are full to finger confrontation. Extraocular movements are intact. Trigeminal sensation is intact and the muscles of mastication are normal. The face is symmetric. The palate elevates in the midline. Voice is normal. Shoulder shrug is normal. The tongue has normal motion without fasciculations.    Coordination:    No dysmetria noted  Gait:    Small strides, mildly shuffling  Motor Observation:    No asymmetry, no atrophy, and no involuntary movements noted. Tone:    Normal muscle tone.    Posture:    Posture is normal. normal erect    Strength:    Strength is intact in the upper and lower limbs.      Sensation: intact     Reflex Exam:  DTR's:    Deep tendon  reflexes in the upper and lower extremities are symmetric Toes:    The toes are downgoing bilaterally.   Clonus:    Clonus is absent.      Assessment/Plan:  Patient here for follow up of BPPV which has resolved. But also complaining of right ear pain, dizziness and pain in the anterior neck in the distribution of the carotid artery. Will order a carotid doppler to evaluate. Will call with results. Can follow up as needed.   Sarina Ill, MD  [facility] [address] [place], [place] [address]  Phone [phone] Fax [phone] Lenor Coffin

## 2014-05-18 NOTE — Patient Instructions (Signed)
Overall you are doing fairly well but I do want to suggest a few things today:   Remember to drink plenty of fluid, eat healthy meals and do not skip any meals. Try to eat protein with a every meal and eat a healthy snack such as fruit or nuts in between meals. Try to keep a regular sleep-wake schedule and try to exercise daily, particularly in the form of walking, 20-30 minutes a day, if you can.   As far as diagnostic testing: Carotid doppler studies  I would like to see you back as needed, sooner if we need to. Please call us with any interim questions, concerns, problems, updates or refill requests.   Please also call us for any test results so we can go over those with you on the phone.  My clinical assistant and will answer any of your questions and relay your messages to me and also relay most of my messages to you.   Our phone number is [phone]. We also have an after hours call service for urgent matters and there is a physician on-call for urgent questions. For any emergencies you know to call 911 or go to the nearest emergency room

## 2014-05-26 ENCOUNTER — Ambulatory Visit (INDEPENDENT_AMBULATORY_CARE_PROVIDER_SITE_OTHER): Payer: Medicare Other

## 2014-05-26 DIAGNOSIS — S15009A Unspecified injury of unspecified carotid artery, initial encounter: Secondary | ICD-10-CM

## 2014-05-26 DIAGNOSIS — R42 Dizziness and giddiness: Secondary | ICD-10-CM

## 2014-06-09 ENCOUNTER — Telehealth: Payer: Self-pay | Admitting: Neurology

## 2014-06-09 NOTE — Telephone Encounter (Signed)
Left message for patient. Her carotid artery doppler study was negative for any hemodynamically significant stenosis of the extracranial carotid arteries bilaterally.

## 2015-04-28 ENCOUNTER — Telehealth: Payer: Self-pay | Admitting: Cardiovascular Disease

## 2015-04-28 NOTE — Telephone Encounter (Signed)
Received records from Dr Jeanann Lewandowsky for appointment on 05/03/15 with Dr Oval Linsey.  Records given to [facility] (medical records) for Dr Blenda Mounts schedule on 05/03/15. lp

## 2015-05-03 ENCOUNTER — Ambulatory Visit (INDEPENDENT_AMBULATORY_CARE_PROVIDER_SITE_OTHER): Payer: Medicare Other | Admitting: Cardiovascular Disease

## 2015-05-03 ENCOUNTER — Encounter: Payer: Self-pay | Admitting: Cardiovascular Disease

## 2015-05-03 VITALS — BP 110/62 | HR 58 | Ht 63.0 in | Wt 219.4 lb

## 2015-05-03 DIAGNOSIS — R072 Precordial pain: Secondary | ICD-10-CM

## 2015-05-03 DIAGNOSIS — R079 Chest pain, unspecified: Secondary | ICD-10-CM | POA: Diagnosis not present

## 2015-05-03 DIAGNOSIS — R0602 Shortness of breath: Secondary | ICD-10-CM | POA: Diagnosis not present

## 2015-05-03 HISTORY — DX: Chest pain, unspecified: R07.9

## 2015-05-03 HISTORY — DX: Shortness of breath: R06.02

## 2015-05-03 NOTE — Progress Notes (Signed)
Cardiology Office Note   Date:  05/03/2015   ID:  [name], DOB 31-Mar-1941, MRN [id number]  PCP:  Foye Spurling, MD  Cardiologist:   Sharol Harness, MD   Chief Complaint  Patient presents with  . New Evaluation    chest pain/sob      History of Present Illness: [name] is a 74 y.o. female with hypertension and hyperlipidemia who presents for evaluation of chest heaviness.  She saw Dr. Jeanann Lewandowsky on 10/20 due to new episodes of chest heaviness and was referred to cardiology for further evaluation.   She has noted shortness of breath and chest pain that has been ongoing for approximately one week.  She noted chest pain at rest and DOE.  She feels like there is a pressure that is sitting on her chest and won't go away.  It lasts for a few minutes at at time and is 5/10 in severity.  She thought it may be related to GERD, however Zantac does not help.  She participates in a water aerobics class and has noted chest heaviness and dyspnea throughout her exercise.  She has been limiting her physical activity due to this discomfort.  She has a long-standing history of lymphedema in bilateral legs and arms.  She denies orthopnea or PND.  She sleeps on two pillows because it limits her vertigo.  She typically exercises 3-4 times per week.  Her diet is "OK."  Lately she has been struggling with sweets. She limits her diet to 1200 cal per day but has not been able to lose weight.  Past Medical History  Diagnosis Date  . Hypertension   . Elevated cholesterol   . GERD (gastroesophageal reflux disease)   . IBS (irritable bowel syndrome)   . Hypothyroidism   . Arthritis     RIGHT KNEE PAIN AND OA  . Swelling     CHRONIC SWELLING RIGHT HAND AND BOTH FEET AND BOTH LEGS  . Rotator cuff disorder     BOTH SHOULDERS - NO SURGERY - AND STATES LIMITATIONS IN ARM MOVEMENT  . Vertigo     Past Surgical History  Procedure Laterality Date  . Cholecystectomy    . Abdominal  hysterectomy    . Hemorrhoid surgery    . Breast surgery      RIGHT BREAST BIOPSY - BENIGN  . Eye surgery      BILATERAL CATARACT EXTRACTIONS; SURGERY FOR MACULAR HOLE   . 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 Aspirin (ECOTRIN PO) Take 80 mg by mouth daily.    . furosemide (LASIX) 20 MG tablet Take 20 mg by mouth every morning.    Marland Kitchen levothyroxine (SYNTHROID, LEVOTHROID) 75 MCG tablet Take 75 mcg by mouth as directed. Monday-saturday    . Multiple Vitamins-Minerals (CENTRUM SILVER PO) Take 1 tablet by mouth daily.     Marland Kitchen olmesartan (BENICAR) 20 MG tablet Take 20 mg by mouth every morning.    . Pimecrolimus (ELIDEL EX) Apply 1 application topically daily as needed (hand pain).     . polyethylene glycol (MIRALAX / GLYCOLAX) packet Take 17 g by mouth daily.    . ranitidine (ZANTAC) 150 MG tablet Take 150 mg by mouth 2 (two) times daily.    . rosuvastatin (CRESTOR)  10 MG tablet Take 10 mg by mouth every evening.     No current facility-administered medications for this visit.    Allergies:   Codeine; Macrobid; and Tramadol    Social History:  The patient  reports that she has never smoked. She has never used smokeless tobacco. She reports that she drinks alcohol. She reports that she does not use illicit drugs.   Family History:  The patient's family history includes Diabetes in her father, mother, and sister.    ROS:  Please see the history of present illness.   Otherwise, review of systems are positive for none.   All other systems are reviewed and negative.    PHYSICAL EXAM: VS:  BP 110/62 mmHg  Pulse 58  Ht 5\' 3"  (1.6 m)  Wt 99.519 kg (219 lb 6.4 oz)  BMI 38.87 kg/m2 , BMI Body mass index is 38.87 kg/(m^2). GENERAL:  Well appearing. Obese. HEENT:  Pupils equal round  and reactive, fundi not visualized, oral mucosa unremarkable NECK:  No jugular venous distention, waveform within normal limits, carotid upstroke brisk and symmetric, no bruits, no thyromegaly LYMPHATICS:  No cervical adenopathy LUNGS:  Clear to auscultation bilaterally HEART:  RRR.  PMI not displaced or sustained,S1 and S2 within normal limits, no S3, no S4, no clicks, no rubs, no murmurs ABD:  Flat, positive bowel sounds normal in frequency in pitch, no bruits, no rebound, no guarding, no midline pulsatile mass, no hepatomegaly, no splenomegaly EXT:  2 plus pulses throughout, 2+ non-pitting edema bilaterally L>R, no cyanosis no clubbing SKIN:  No rashes no nodules NEURO:  Cranial nerves II through XII grossly intact, motor grossly intact throughout PSYCH:  Cognitively intact, oriented to person place and time   EKG:  EKG is ordered today. The ekg ordered today demonstrates sinus bradycardia at 58 bpm.  LAFB.  Non-specific T wave abnormalities.   Recent Labs: No results found for requested labs within last 365 days.    Lipid Panel No results found for: CHOL, TRIG, HDL, CHOLHDL, VLDL, LDLCALC, LDLDIRECT    Wt Readings from Last 3 Encounters:  05/03/15 99.519 kg (219 lb 6.4 oz)  05/18/14 99.791 kg (220 lb)  01/06/14 99.791 kg (220 lb)      ASSESSMENT AND PLAN:  # CCS Class IV Angina: Ms. Avery symptoms are very concerning for angina. Her main risk factors include hyperlipidemia, age and obesity. We will refer her for exercise Cardiolite in order to evaluate for obstructive coronary disease. She is to continue on aspirin  # Hyperlipidemia: Dr. Carlis Abbott manages Ms. Pischke hyperlipidemia. She will continue rosuvastatin.  # Obesity: Ms. Junod was encouraged to continue her exercise routine once we finish her evaluation for coronary artery disease. She is already doing a good job of limiting her caloric intake. She was encouraged to keep this up and to avoid sweets.  # Lymphedema: Ms. Ytuarte  reports that her lymphedema has been present for over 20 years. She has no evidence of jugular venous distention or crackles on exam. Her edema is non-pitting and is not consistent with heart failure.  Current medicines are reviewed at length with the patient today.  The patient does not have concerns regarding medicines.  The following changes have been made:  no change  Labs/ tests ordered today include:  No orders of the defined types were placed in this encounter.    Disposition:   FU with Emeka Lindner C. Oval Linsey, MD in 1 year    Signed, Sharol Harness, MD  05/03/2015 10:55 AM    [facility]

## 2015-05-03 NOTE — Patient Instructions (Signed)
Your physician has requested that you have en exercise stress myoview. For further information please visit [email or web address]. Please follow instruction sheet, as given.  Dr Oval Linsey recommends that you schedule a follow-up appointment in 1 year. You will receive a reminder letter in the mail two months in advance. If you don't receive a letter, please call our office to schedule the follow-up appointment.  If you need a refill on your cardiac medications before your next appointment, please call your pharmacy.

## 2015-05-10 ENCOUNTER — Telehealth (HOSPITAL_COMMUNITY): Payer: Self-pay

## 2015-05-10 NOTE — Telephone Encounter (Signed)
Encounter complete. 

## 2015-05-12 ENCOUNTER — Encounter (HOSPITAL_COMMUNITY): Payer: Self-pay | Admitting: *Deleted

## 2015-05-12 ENCOUNTER — Ambulatory Visit (HOSPITAL_COMMUNITY)
Admission: RE | Admit: 2015-05-12 | Discharge: 2015-05-12 | Disposition: A | Discharge status: Home / Self Care | Payer: Medicare Other | Source: Ambulatory Visit | Attending: Cardiovascular Disease | Admitting: Cardiovascular Disease

## 2015-05-12 DIAGNOSIS — R0602 Shortness of breath: Secondary | ICD-10-CM | POA: Insufficient documentation

## 2015-05-12 DIAGNOSIS — I10 Essential (primary) hypertension: Secondary | ICD-10-CM | POA: Diagnosis not present

## 2015-05-12 DIAGNOSIS — Z6838 Body mass index (BMI) 38.0-38.9, adult: Secondary | ICD-10-CM | POA: Insufficient documentation

## 2015-05-12 DIAGNOSIS — R079 Chest pain, unspecified: Secondary | ICD-10-CM | POA: Diagnosis not present

## 2015-05-12 DIAGNOSIS — E669 Obesity, unspecified: Secondary | ICD-10-CM | POA: Diagnosis not present

## 2015-05-12 DIAGNOSIS — R0609 Other forms of dyspnea: Secondary | ICD-10-CM | POA: Insufficient documentation

## 2015-05-12 DIAGNOSIS — R42 Dizziness and giddiness: Secondary | ICD-10-CM | POA: Diagnosis not present

## 2015-05-12 LAB — MYOCARDIAL PERFUSION IMAGING
CHL CUP NUCLEAR SRS: 4
CHL CUP NUCLEAR SSS: 4
CSEPPHR: 87 {beats}/min
LV dias vol: 50 mL
LVSYSVOL: 19 mL
NUC STRESS TID: 0.94
Rest HR: 57 {beats}/min
SDS: 0

## 2015-05-12 MED ORDER — TECHNETIUM TC 99M SESTAMIBI GENERIC - CARDIOLITE
10.8000 | Freq: Once | INTRAVENOUS | Status: AC | PRN
  Administered 2015-05-12: 10.8 via INTRAVENOUS

## 2015-05-12 MED ORDER — REGADENOSON 0.4 MG/5ML IV SOLN
0.4000 mg | Freq: Once | INTRAVENOUS | Status: AC
  Administered 2015-05-12: 0.4 mg via INTRAVENOUS

## 2015-05-12 MED ORDER — TECHNETIUM TC 99M SESTAMIBI GENERIC - CARDIOLITE
29.6000 | Freq: Once | INTRAVENOUS | Status: AC | PRN
  Administered 2015-05-12: 29.6 via INTRAVENOUS

## 2015-05-12 NOTE — Progress Notes (Unsigned)
Patient ID: [name], female   DOB: September 10, 1940, 74 y.o.   MRN: [id number] Patient stated after having the treadmill explained that she didn't think she could do it, especially because of her knees; was changed to a [place]

## 2015-05-16 ENCOUNTER — Telehealth: Payer: Self-pay | Admitting: *Deleted

## 2015-05-16 NOTE — Telephone Encounter (Signed)
Spoke to patient. Result given . Verbalized understanding  

## 2015-05-16 NOTE — Telephone Encounter (Signed)
-----   Message from Skeet Latch, MD sent at 05/16/2015  3:04 PM EST ----- Normal stress test.

## 2016-04-27 ENCOUNTER — Encounter (HOSPITAL_BASED_OUTPATIENT_CLINIC_OR_DEPARTMENT_OTHER): Payer: Self-pay | Admitting: *Deleted

## 2016-04-27 ENCOUNTER — Emergency Department (HOSPITAL_BASED_OUTPATIENT_CLINIC_OR_DEPARTMENT_OTHER)
Admission: EM | Admit: 2016-04-27 | Discharge: 2016-04-27 | Disposition: A | Discharge status: Home / Self Care | Payer: Medicare Other | Attending: Emergency Medicine | Admitting: Emergency Medicine

## 2016-04-27 DIAGNOSIS — E039 Hypothyroidism, unspecified: Secondary | ICD-10-CM | POA: Diagnosis not present

## 2016-04-27 DIAGNOSIS — M25552 Pain in left hip: Secondary | ICD-10-CM | POA: Diagnosis not present

## 2016-04-27 DIAGNOSIS — Z79899 Other long term (current) drug therapy: Secondary | ICD-10-CM | POA: Diagnosis not present

## 2016-04-27 DIAGNOSIS — I10 Essential (primary) hypertension: Secondary | ICD-10-CM | POA: Insufficient documentation

## 2016-04-27 DIAGNOSIS — Z7982 Long term (current) use of aspirin: Secondary | ICD-10-CM | POA: Insufficient documentation

## 2016-04-27 DIAGNOSIS — M545 Low back pain: Secondary | ICD-10-CM | POA: Diagnosis present

## 2016-04-27 LAB — URINE MICROSCOPIC-ADD ON

## 2016-04-27 LAB — URINALYSIS, ROUTINE W REFLEX MICROSCOPIC
BILIRUBIN URINE: NEGATIVE
GLUCOSE, UA: NEGATIVE mg/dL
KETONES UR: NEGATIVE mg/dL
Nitrite: NEGATIVE
PH: 5.5 (ref 5.0–8.0)
PROTEIN: NEGATIVE mg/dL
Specific Gravity, Urine: 1.021 (ref 1.005–1.030)

## 2016-04-27 MED ORDER — TIZANIDINE HCL 2 MG PO CAPS: 2.0000 mg | ORAL_CAPSULE | Freq: Every day | ORAL | 0 refills | Status: DC

## 2016-04-27 MED ORDER — CYCLOBENZAPRINE HCL 10 MG PO TABS: 10.0000 mg | ORAL_TABLET | Freq: Every day | ORAL | 0 refills | Status: DC

## 2016-04-27 MED ORDER — DICLOFENAC SODIUM 1 % TD GEL: 2.0000 g | Freq: Four times a day (QID) | TRANSDERMAL | 0 refills | Status: DC

## 2016-04-27 MED FILL — tiZANidine HCL 2 MG TABS: 2 | 10 days supply | Qty: 10 | Fill #0

## 2016-04-27 MED FILL — DICLOFENAC SODIUM 1% GEL: 1 | 13 days supply | Qty: 100 | Fill #0

## 2016-04-27 NOTE — ED Triage Notes (Signed)
Left flank pain x 2 weeks. Pain is constant but worse when she gets in and out of her car. No known injury.

## 2016-04-27 NOTE — ED Notes (Signed)
PA at bedside for assessment. Pt denies injury or trauma, GI/GU issues, no tingling or numbness in legs. States she has only taken tylenol here and there.

## 2016-04-27 NOTE — ED Provider Notes (Signed)
[facility] DEPT [facility] Provider Note   CSN: [id number] Arrival date & time: 04/27/16  1231     History   Chief Complaint Chief Complaint  Patient presents with  . Flank Pain    HPI [name] is a 75 y.o. female.  HPI [name] is a 75 y.o. female with PMH significant for HTN, hypothyroidism, IBS,  who presents with sudden onset, constant, mild-moderate, nonradiating, left lower back pain that began 2 weeks ago "out of the blue".  No injury/trauma.  She states it's worse when she tries to get out of her car or with certain movements. she states it feels like it'll catch. She denies any chronic back pain issues, but states she did have back problems in the past. No associated fever, chills, nausea, vomiting, diarrhea, constipation, urinary symptoms, abdominal pain, bowel or bladder incontinence, urinary retention, numbness, weakness, or gait difficulties. She has been taking Tylenol intermittently with minimal relief. She states she is unable to take NSAIDs due to stomach issues.  Past Medical History:  Diagnosis Date  . Arthritis    RIGHT KNEE PAIN AND OA  . Chest pain 05/03/2015  . Elevated cholesterol   . GERD (gastroesophageal reflux disease)   . Hypertension   . Hypothyroidism   . IBS (irritable bowel syndrome)   . Rotator cuff disorder    BOTH SHOULDERS - NO SURGERY - AND STATES LIMITATIONS IN ARM MOVEMENT  . Shortness of breath 05/03/2015  . Swelling    CHRONIC SWELLING RIGHT HAND AND BOTH FEET AND BOTH LEGS  . Vertigo     Patient Active Problem List   Diagnosis Date Noted  . Chest pain 05/03/2015  . Shortness of breath 05/03/2015  . Dizziness and giddiness 05/18/2014  . 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 Vertigo 01/06/2014  . Postoperative anemia due to acute blood loss 12/30/2012  . OA (osteoarthritis) of knee 12/29/2012    Past Surgical History:  Procedure Laterality Date  . ABDOMINAL HYSTERECTOMY    . BREAST SURGERY     RIGHT BREAST BIOPSY -  BENIGN  . CHOLECYSTECTOMY    . EYE SURGERY     BILATERAL CATARACT EXTRACTIONS; SURGERY FOR MACULAR HOLE   . HEMORRHOID SURGERY    . 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 Authorizing Provider  Aspirin (ECOTRIN PO) Take 80 mg by mouth daily.   Yes Historical Provider, MD  furosemide (LASIX) 20 MG tablet Take 20 mg by mouth every morning.   Yes Historical Provider, MD  levothyroxine (SYNTHROID, LEVOTHROID) 75 MCG tablet Take 75 mcg by mouth as directed. Monday-saturday   Yes Historical Provider, MD  Multiple Vitamins-Minerals (CENTRUM SILVER PO) Take 1 tablet by mouth daily.    Yes Historical Provider, MD  olmesartan (BENICAR) 20 MG tablet Take 20 mg by mouth every morning.   Yes Historical Provider, MD  polyethylene glycol (MIRALAX / GLYCOLAX) packet Take 17 g by mouth daily.   Yes Historical Provider, MD  ranitidine (ZANTAC) 150 MG tablet Take 150 mg by mouth 2 (two) times daily.   Yes Historical Provider, MD  rosuvastatin (CRESTOR) 10 MG tablet Take 10 mg by mouth every evening.  Yes Historical Provider, MD  diclofenac sodium (VOLTAREN) 1 % GEL Apply 2 g topically 4 (four) times daily. 04/27/16   Gloriann Loan, PA-C  Pimecrolimus (ELIDEL EX) Apply 1 application topically daily as needed (hand pain).     Historical Provider, MD  tizanidine (ZANAFLEX) 2 MG capsule Take 1 capsule (2 mg total) by mouth at bedtime. 04/27/16   Gloriann Loan, PA-C    Family History Family History  Problem Relation Age of Onset  . Diabetes Mother   . Dementia Mother   . Diabetes Father   . Diabetes Sister     Social History Social History  Substance Use Topics  . Smoking status: Never Smoker  . Smokeless tobacco: Never Used  .  Alcohol use Yes     Comment: OCCAS ALCOLHOL     Allergies   Codeine; Macrobid [nitrofurantoin macrocrystal]; and Tramadol   Review of Systems Review of Systems All other systems negative unless otherwise stated in HPI   Physical Exam Updated Vital Signs BP 144/74 (BP Location: Left Arm)   Pulse 65   Temp 98 F (36.7 C) (Oral)   Resp 18   Ht 5\' 3"  (1.6 m)   Wt 98 kg   SpO2 99%   BMI 38.26 kg/m   Physical Exam  Constitutional: She is oriented to person, place, and time. She appears well-developed and well-nourished.  Non-toxic appearance. She does not have a sickly appearance. She does not appear ill.  HENT:  Head: Normocephalic and atraumatic.  Mouth/Throat: Oropharynx is clear and moist.  Eyes: Conjunctivae are normal.  Neck: Normal range of motion. Neck supple.  Cardiovascular: Normal rate and regular rhythm.   Pulmonary/Chest: Effort normal and breath sounds normal. No accessory muscle usage or stridor. No respiratory distress. She has no wheezes. She has no rhonchi. She has no rales.  Abdominal: Soft. Bowel sounds are normal. She exhibits no distension. There is no tenderness.  No CVA tenderness.  Musculoskeletal: Normal range of motion. She exhibits tenderness.  No cervical, thoracic, lumbar midline tenderness. She does have tenderness over the left iliac crest and anterior superior iliac spine.  Mild left hip tenderness. PROM of left hip elicits pain.   Lymphadenopathy:    She has no cervical adenopathy.  Neurological: She is alert and oriented to person, place, and time.  Normal strength and sensation throughout bilateral lower extremities. No saddle anesthesia. Negative SLR bilaterally.   Skin: Skin is warm and dry.  Psychiatric: She has a normal mood and affect. Her behavior is normal.     ED Treatments / Results  Labs (all labs ordered are listed, but only abnormal results are displayed) Labs Reviewed  URINALYSIS, ROUTINE W REFLEX MICROSCOPIC (NOT AT  [facility]) - Abnormal; Notable for the following:       Result Value   Hgb urine dipstick MODERATE (*)    Leukocytes, UA MODERATE (*)    All other components within normal limits  URINE MICROSCOPIC-ADD ON - Abnormal; Notable for the following:    Squamous Epithelial / LPF 0-5 (*)    Bacteria, UA MANY (*)    All other components within normal limits  URINE CULTURE    EKG  EKG Interpretation None       Radiology No results found.  Procedures Procedures (including critical care time)  Medications Ordered in ED Medications - No data to display   Initial Impression / Assessment and Plan / ED Course  I have reviewed the triage vital signs and the nursing notes.  Pertinent labs & imaging results that were available during my care of the patient were reviewed by me and considered in my medical decision making (see chart for details).  Clinical Course   Patient presents with 2 weeks of left lower back pain. No other associated symptoms. She is ambulatory. Vitals reassuring. On exam, patient appears well, nontoxic or septic. She has no midline tenderness. No CVA tenderness. She does have tenderness over the left iliac crest and ASIS. Passive range of motion of left hip increases pain. Negative straight leg raise bilaterally. Pain appears to be musculoskeletal in nature. Consider infectious etiology, but no systemic symptoms, UA with moderate leukocytes, 6-30 WBCs, many bacteria.  Asymptomatic.  Urine culture sent. Abdomen soft and benign without rebound, guarding or rigidity. Do not suspect AAA.  Normal neurological exam. Do not suspect cauda equina or disc herniation.  Plan to discharge home with Voltaren gel and Zanaflex  Follow up with orthopedics.  Return precautions discussed.  Stable for discharge.  Case has been discussed with and seen by Dr. Maryan Rued who agrees with the above plan for discharge.  Final Clinical Impressions(s) / ED Diagnoses   Final diagnoses:  Left hip pain     New Prescriptions New Prescriptions   DICLOFENAC SODIUM (VOLTAREN) 1 % GEL    Apply 2 g topically 4 (four) times daily.   TIZANIDINE (ZANAFLEX) 2 MG CAPSULE    Take 1 capsule (2 mg total) by mouth at bedtime.     Gloriann Loan, PA-C 04/27/16 1603    Gloriann Loan, PA-C 04/27/16 [id number]    Blanchie Dessert, MD 04/29/16 [id number]

## 2016-04-28 LAB — URINE CULTURE

## 2017-01-10 ENCOUNTER — Encounter (INDEPENDENT_AMBULATORY_CARE_PROVIDER_SITE_OTHER): Payer: Self-pay

## 2017-01-10 ENCOUNTER — Ambulatory Visit (INDEPENDENT_AMBULATORY_CARE_PROVIDER_SITE_OTHER): Payer: Medicare Other | Admitting: Neurology

## 2017-01-10 ENCOUNTER — Ambulatory Visit (INDEPENDENT_AMBULATORY_CARE_PROVIDER_SITE_OTHER): Payer: Self-pay | Admitting: Neurology

## 2017-01-10 DIAGNOSIS — Z0289 Encounter for other administrative examinations: Secondary | ICD-10-CM

## 2017-01-10 DIAGNOSIS — G5603 Carpal tunnel syndrome, bilateral upper limbs: Secondary | ICD-10-CM

## 2017-01-10 DIAGNOSIS — G5600 Carpal tunnel syndrome, unspecified upper limb: Secondary | ICD-10-CM

## 2017-01-14 NOTE — Progress Notes (Signed)
See procedure note.

## 2017-01-15 NOTE — Procedures (Signed)
Full Name: Mattingly Fountaine Gender: Female MRN #: [id number] Date of Birth: [birth year]    Visit Date: 01/10/17 10:44 Age: 76 Years 63 Months Old Examining Physician: Sarina Ill, MD  Referring Physician: Jeanann Lewandowsky, MD  History: Patient is here for evaluation of carpal tunnel syndrome  Summary:  EMG nerve conduction study was performed on the bilateral upper extremities. The right median motor nerve showed delayed distal onset latency (6.2 ms, N< 4.4). The left median motor nerve showed delayed distal onset latency (4.6 ms, N< 4.4). The right median orthodromic second digit sensory nerve showed no response. The left median orthodromic second digit sensory nerve showed delayed distal peak latency (4.5 ms, N< 3.4) and reduced amplitude (4 V, N>10)  Conclusion: There is electrophysiologic evidence for right greater than left moderately-severe carpal tunnel syndrome.  Cc: Dr. Debe Coder M.D.  [facility] [place], [place] [address] Tel: [phone] Fax: [phone]        [facility]    Nerve / Sites Muscle Latency Ref. Amplitude Ref. Rel Amp Segments Distance Velocity Ref. Area    ms ms mV mV %  cm m/s m/s mVms  R Median - APB     Wrist APB 6.2 ?4.4 5.1 ?4.0 100 Wrist - APB 7   18.8     Upper arm APB 9.6  4.6  90.2 Upper arm - Wrist 19 56 ?49 18.4  L Median - APB     Wrist APB 4.6 ?4.4 6.0 ?4.0 100 Wrist - APB 7   21.4     Upper arm APB 8.6  5.1  84.7 Upper arm - Wrist 20 51 ?49 21.1  R Ulnar - ADM     Wrist ADM 3.0 ?3.3 5.3 ?6.0 100 Wrist - ADM 7   16.4     B.Elbow ADM 6.5  4.8  89.6 B.Elbow - Wrist 20 57 ?49 14.9     A.Elbow ADM 8.8  4.7  98.7 A.Elbow - B.Elbow 12 52 ?49 15.8         A.Elbow - Wrist      L Ulnar - ADM     Wrist ADM 2.9 ?3.3 7.0 ?6.0 100 Wrist - ADM 7   20.5     B.Elbow ADM 6.1  5.6  79.9 B.Elbow - Wrist 18 56 ?49 19.6     A.Elbow ADM 8.5  5.5  98.1 A.Elbow - B.Elbow 12 49 ?49 19.1         A.Elbow - Wrist                   SNC    Nerve / Sites Rec. Site Peak Lat Ref.  Amp Ref. Segments Distance    ms ms V V  cm  R Median - Orthodromic (Dig II, Mid palm)     Dig II Wrist NR ?3.4 NR ?10 Dig II - Wrist 13  L Median - Orthodromic (Dig II, Mid palm)     Dig II Wrist 4.5 ?3.4 4 ?10 Dig II - Wrist 13  R Ulnar - Orthodromic, (Dig V, Mid palm)     Dig V Wrist 2.9 ?3.1 4 ?5 Dig V - Wrist 11  L Ulnar - Orthodromic, (Dig V, Mid palm)     Dig V Wrist 2.8 ?3.1 8 ?5 Dig V - Wrist 11             F  Wave    Nerve  F Lat Ref.   ms ms  R Ulnar - ADM 29.7 ?32.0  L Ulnar - ADM 29.9 ?32.0         EMG full       EMG Summary Table    Spontaneous MUAP Recruitment  Muscle IA Fib PSW Fasc Other Amp Dur. Poly Pattern  L. Cervical paraspinals (low) Normal None None None _______ Normal Normal Normal Normal  R. Cervical paraspinals (low) Normal None None None _______ Normal Normal Normal Normal  L. Deltoid Normal None None None _______ Normal Normal Normal Normal  R. Deltoid Normal None None None _______ Normal Normal Normal Normal  L. Triceps brachii Normal None None None _______ Normal Normal Normal Normal  R. Triceps brachii Normal None None None _______ Normal Normal Normal Normal  L. Opponens pollicis Normal None None None _______ Normal Normal Normal Normal  R. Opponens pollicis Normal None None None _______ Normal Normal Normal Normal  L. First dorsal interosseous Normal None None None _______ Normal Normal Normal Normal  R. First dorsal interosseous Normal None None None _______ Normal Normal Normal Normal  L. Pronator teres Normal None None None _______ Normal Normal Normal Normal  R. Pronator teres Normal None None None _______ Normal Normal Normal Normal

## 2017-01-15 NOTE — Progress Notes (Signed)
Full Name: [name] Gender: Female MRN #: [id number] Date of Birth: [birth year]    Visit Date: 01/10/17 10:44 Age: 76 Years 66 Months Old Examining Physician: Sarina Ill, MD  Referring Physician: Jeanann Lewandowsky, MD  History: Patient is here for evaluation of carpal tunnel syndrome  Summary:  EMG nerve conduction study was performed on the bilateral upper extremities. The right median motor nerve showed delayed distal onset latency (6.2 ms, N< 4.4). The left median motor nerve showed delayed distal onset latency (4.6 ms, N< 4.4). The right median orthodromic second digit sensory nerve showed no response. The left median orthodromic second digit sensory nerve showed delayed distal peak latency (4.5 ms, N< 3.4) and reduced amplitude (4 V, N>10)  Conclusion: There is electrophysiologic evidence for right greater than left moderately-severe carpal tunnel syndrome.  Cc: Dr. Debe Coder M.D.  [facility] [place], [place] [address] Tel: [phone] Fax: [phone]        [facility]    Nerve / Sites Muscle Latency Ref. Amplitude Ref. Rel Amp Segments Distance Velocity Ref. Area    ms ms mV mV %  cm m/s m/s mVms  R Median - APB     Wrist APB 6.2 ?4.4 5.1 ?4.0 100 Wrist - APB 7   18.8     Upper arm APB 9.6  4.6  90.2 Upper arm - Wrist 19 56 ?49 18.4  L Median - APB     Wrist APB 4.6 ?4.4 6.0 ?4.0 100 Wrist - APB 7   21.4     Upper arm APB 8.6  5.1  84.7 Upper arm - Wrist 20 51 ?49 21.1  R Ulnar - ADM     Wrist ADM 3.0 ?3.3 5.3 ?6.0 100 Wrist - ADM 7   16.4     B.Elbow ADM 6.5  4.8  89.6 B.Elbow - Wrist 20 57 ?49 14.9     A.Elbow ADM 8.8  4.7  98.7 A.Elbow - B.Elbow 12 52 ?49 15.8         A.Elbow - Wrist      L Ulnar - ADM     Wrist ADM 2.9 ?3.3 7.0 ?6.0 100 Wrist - ADM 7   20.5     B.Elbow ADM 6.1  5.6  79.9 B.Elbow - Wrist 18 56 ?49 19.6     A.Elbow ADM 8.5  5.5  98.1 A.Elbow - B.Elbow 12 49 ?49 19.1         A.Elbow - Wrist                   SNC    Nerve / Sites Rec. Site Peak Lat Ref.  Amp Ref. Segments Distance    ms ms V V  cm  R Median - Orthodromic (Dig II, Mid palm)     Dig II Wrist NR ?3.4 NR ?10 Dig II - Wrist 13  L Median - Orthodromic (Dig II, Mid palm)     Dig II Wrist 4.5 ?3.4 4 ?10 Dig II - Wrist 13  R Ulnar - Orthodromic, (Dig V, Mid palm)     Dig V Wrist 2.9 ?3.1 4 ?5 Dig V - Wrist 11  L Ulnar - Orthodromic, (Dig V, Mid palm)     Dig V Wrist 2.8 ?3.1 8 ?5 Dig V - Wrist 11             F  Wave    Nerve  F Lat Ref.   ms ms  R Ulnar - ADM 29.7 ?32.0  L Ulnar - ADM 29.9 ?32.0         EMG full       EMG Summary Table    Spontaneous MUAP Recruitment  Muscle IA Fib PSW Fasc Other Amp Dur. Poly Pattern  L. Cervical paraspinals (low) Normal None None None _______ Normal Normal Normal Normal  R. Cervical paraspinals (low) Normal None None None _______ Normal Normal Normal Normal  L. Deltoid Normal None None None _______ Normal Normal Normal Normal  R. Deltoid Normal None None None _______ Normal Normal Normal Normal  L. Triceps brachii Normal None None None _______ Normal Normal Normal Normal  R. Triceps brachii Normal None None None _______ Normal Normal Normal Normal  L. Opponens pollicis Normal None None None _______ Normal Normal Normal Normal  R. Opponens pollicis Normal None None None _______ Normal Normal Normal Normal  L. First dorsal interosseous Normal None None None _______ Normal Normal Normal Normal  R. First dorsal interosseous Normal None None None _______ Normal Normal Normal Normal  L. Pronator teres Normal None None None _______ Normal Normal Normal Normal  R. Pronator teres Normal None None None _______ Normal Normal Normal Normal

## 2017-10-17 ENCOUNTER — Ambulatory Visit (INDEPENDENT_AMBULATORY_CARE_PROVIDER_SITE_OTHER): Payer: Medicare Other | Admitting: Neurology

## 2017-10-17 ENCOUNTER — Telehealth: Payer: Self-pay | Admitting: Neurology

## 2017-10-17 ENCOUNTER — Encounter: Payer: Self-pay | Admitting: Neurology

## 2017-10-17 VITALS — BP 125/60 | HR 93 | Ht 63.0 in | Wt 226.8 lb

## 2017-10-17 DIAGNOSIS — R519 Headache, unspecified: Secondary | ICD-10-CM

## 2017-10-17 DIAGNOSIS — R2689 Other abnormalities of gait and mobility: Secondary | ICD-10-CM | POA: Diagnosis not present

## 2017-10-17 DIAGNOSIS — H9201 Otalgia, right ear: Secondary | ICD-10-CM | POA: Diagnosis not present

## 2017-10-17 DIAGNOSIS — R29898 Other symptoms and signs involving the musculoskeletal system: Secondary | ICD-10-CM

## 2017-10-17 DIAGNOSIS — A881 Epidemic vertigo: Secondary | ICD-10-CM | POA: Diagnosis not present

## 2017-10-17 DIAGNOSIS — R270 Ataxia, unspecified: Secondary | ICD-10-CM

## 2017-10-17 DIAGNOSIS — R51 Headache: Secondary | ICD-10-CM

## 2017-10-17 NOTE — Telephone Encounter (Signed)
[employer] Medicare order sent to [facility]. No auth they will reach out to the pt to schedule.  °

## 2017-10-17 NOTE — Patient Instructions (Signed)
MRI brain Vestibular therapy  Vertigo Vertigo is the feeling that you or your surroundings are moving when they are not. Vertigo can be dangerous if it occurs while you are doing something that could endanger you or others, such as driving. What are the causes? This condition is caused by a disturbance in the signals that are sent by your body's sensory systems to your brain. Different causes of a disturbance can lead to vertigo, including:  Infections, especially in the inner ear.  A bad reaction to a drug, or misuse of alcohol and medicines.  Withdrawal from drugs or alcohol.  Quickly changing positions, as when lying down or rolling over in bed.  Migraine headaches.  Decreased blood flow to the brain.  Decreased blood pressure.  Increased pressure in the brain from a head or neck injury, stroke, infection, tumor, or bleeding.  Central nervous system disorders.  What are the signs or symptoms? Symptoms of this condition usually occur when you move your head or your eyes in different directions. Symptoms may start suddenly, and they usually last for less than a minute. Symptoms may include:  Loss of balance and falling.  Feeling like you are spinning or moving.  Feeling like your surroundings are spinning or moving.  Nausea and vomiting.  Blurred vision or double vision.  Difficulty hearing.  Slurred speech.  Dizziness.  Involuntary eye movement (nystagmus).  Symptoms can be mild and cause only slight annoyance, or they can be severe and interfere with daily life. Episodes of vertigo may return (recur) over time, and they are often triggered by certain movements. Symptoms may improve over time. How is this diagnosed? This condition may be diagnosed based on medical history and the quality of your nystagmus. Your health care provider may test your eye movements by asking you to quickly change positions to trigger the nystagmus. This may be called the Dix-Hallpike  test, head thrust test, or roll test. You may be referred to a health care provider who specializes in ear, nose, and throat (ENT) problems (otolaryngologist) or a provider who specializes in disorders of the central nervous system (neurologist). You may have additional testing, including:  A physical exam.  Blood tests.  MRI.  A CT scan.  An electrocardiogram (ECG). This records electrical activity in your heart.  An electroencephalogram (EEG). This records electrical activity in your brain.  Hearing tests.  How is this treated? Treatment for this condition depends on the cause and the severity of the symptoms. Treatment options include:  Medicines to treat nausea or vertigo. These are usually used for severe cases. Some medicines that are used to treat other conditions may also reduce or eliminate vertigo symptoms. These include: ? Medicines that control allergies (antihistamines). ? Medicines that control seizures (anticonvulsants). ? Medicines that relieve depression (antidepressants). ? Medicines that relieve anxiety (sedatives).  Head movements to adjust your inner ear back to normal. If your vertigo is caused by an ear problem, your health care provider may recommend certain movements to correct the problem.  Surgery. This is rare.  Follow these instructions at home: Safety  Move slowly.Avoid sudden body or head movements.  Avoid driving.  Avoid operating heavy machinery.  Avoid doing any tasks that would cause danger to you or others if you would have a vertigo episode during the task.  If you have trouble walking or keeping your balance, try using a cane for stability. If you feel dizzy or unstable, sit down right away.  Return to your normal  activities as told by your health care provider. Ask your health care provider what activities are safe for you. General instructions  Take over-the-counter and prescription medicines only as told by your health care  provider.  Avoid certain positions or movements as told by your health care provider.  Drink enough fluid to keep your urine clear or pale yellow.  Keep all follow-up visits as told by your health care provider. This is important. Contact a health care provider if:  Your medicines do not relieve your vertigo or they make it worse.  You have a fever.  Your condition gets worse or you develop new symptoms.  Your family or friends notice any behavioral changes.  Your nausea or vomiting gets worse.  You have numbness or a "pins and needles" sensation in part of your body. Get help right away if:  You have difficulty moving or speaking.  You are always dizzy.  You faint.  You develop severe headaches.  You have weakness in your hands, arms, or legs.  You have changes in your hearing or vision.  You develop a stiff neck.  You develop sensitivity to light. This information is not intended to replace advice given to you by your health care provider. Make sure you discuss any questions you have with your health care provider. Document Released: 04/04/2005 Document Revised: 12/07/2015 Document Reviewed: 10/18/2014 Elsevier Interactive Patient Education  [employer].

## 2017-10-17 NOTE — Progress Notes (Signed)
[facility]    Provider:  Dr Jaynee Eagles Referring Provider: Foye Spurling, MD Primary Care Physician:  Glendale Chard, MD  CC:  Vertigo, neck pain  Interval history: She has a history of BPPV, now happening again. Started about a month ago. Symptoms are similar to those in the past. If she turns over in the bed she get room spinning. Always on movement. MRI in the past has been normal but last in 2015. She feels weak in the left leg. Imbalance when walking. She has had some hearing problems in the right ear and ear pain, left leg weakness but no falls. She saw Dr. Charyl Bigger for her CTS and she feels better and using wrist splints.   HPI:  [name] is a 77 y.o. female here as a follow up for vertigo and new neck pain. In bed when she turned over she would have room spinning. She would get dizzy. She was shown the epley maneuver which helped. She has not had vertigo for 4-5 months. Just getting a little dizzy sometimes but otherwise feeling well.Vestibular rehab significantly helped.  She has pain which radiates in her right neck, unsure how long but probably started after the vertigo for the last few months. Possibly started with neck extension and vertiginous symptoms. Pain startes in the right ear and travels into the anterior portion of the neck, painful to palpation in the neck. Describes as tenderness.  Associated with dizzyness. She has spots in her vision occasionally without headaches. No blurry vision. No jaw pain or pain on chewing. No vision loss. No pain in the temples.    Reviewed notes, labs and imaging from outside physicians, which showed: Was seeing Dr. Janann Colonel for vertigo. Only noted when in bed when neck extended back or anytime she extends head/neck back. Noted sensation of room spinning with these episodes. Lasted a few seconds then goes away on its own. Personally reviewed images MRi of the brain which only showed non-specific white matter changes. recently had  increased pth and increased calcium levels with nml urine calcium.   Review of Systems: Patient complains of symptoms per HPI as well as the following symptoms ear pain, runny nose, SOB, moles, difficulty walking. Pertinent negatives per HPI. All others negative.   Social History   Socioeconomic History  . Marital status: Widowed    Spouse name: nathaniel  . Number of children: 4  . Years of education: 79  . Highest education level: Not on file  Occupational History  . Occupation: Retired  Scientific laboratory technician  . Financial resource strain: Not on file  . Food insecurity:    Worry: Not on file    Inability: Not on file  . Transportation needs:    Medical: Not on file    Non-medical: Not on file  Tobacco Use  . Smoking status: Never Smoker  . Smokeless tobacco: Never Used  Substance and Sexual Activity  . Alcohol use: Yes    Comment: OCCAS ALCOHOL  . Drug use: No  . Sexual activity: Not on file  Lifestyle  . Physical activity:    Days per week: Not on file    Minutes per session: Not on file  . Stress: Not on file  Relationships  . Social connections:    Talks on phone: Not on file    Gets together: Not on file    Attends religious service: Not on file    Active member of club or organization: Not on file  Attends meetings of clubs or organizations: Not on file    Relationship status: Not on file  . Intimate partner violence:    Fear of current or ex partner: Not on file    Emotionally abused: Not on file    Physically abused: Not on file    Forced sexual activity: Not on file  Other Topics Concern  . Not on file  Social History Narrative   [name]   Patient is retired.    Patient has 4 children.    Patient has a high school education.    Patient is widowed   Right handed      Epworth Sleepiness Scale = 1 (as of 05/03/2015)    Family History  Problem Relation Age of Onset  . Diabetes Mother   . Dementia Mother   . Diabetes Father   .  Diabetes Sister   . Breast cancer Sister   . Kidney cancer Sister   . Lung cancer Sister   . Lung cancer Maternal Aunt   . Pancreatic cancer Maternal Aunt     Past Medical History:  Diagnosis Date  . Arthritis    RIGHT KNEE PAIN AND OA  . Chest pain 05/03/2015  . Elevated cholesterol   . GERD (gastroesophageal reflux disease)   . Hypertension   . Hypothyroidism   . IBS (irritable bowel syndrome)   . Rotator cuff disorder    BOTH SHOULDERS - NO SURGERY - AND STATES LIMITATIONS IN ARM MOVEMENT  . Shortness of breath 05/03/2015  . Swelling    CHRONIC SWELLING RIGHT HAND AND BOTH FEET AND BOTH LEGS  . Vertigo     Past Surgical History:  Procedure Laterality Date  . ABDOMINAL HYSTERECTOMY    . BREAST SURGERY     RIGHT BREAST BIOPSY - BENIGN  . CHOLECYSTECTOMY    . EYE SURGERY     BILATERAL CATARACT EXTRACTIONS; SURGERY FOR MACULAR HOLE   . HEMORRHOID SURGERY    . 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 Aspirin (ECOTRIN PO) Take 80 mg by mouth daily.    . bimatoprost (LUMIGAN) 0.01 % SOLN Place 1 drop into both eyes at bedtime.     . furosemide (LASIX) 20 MG tablet Take 20 mg by mouth every morning.    Marland Kitchen levothyroxine (SYNTHROID, LEVOTHROID) 75 MCG tablet Take 75 mcg by mouth daily before breakfast.     . Multiple Vitamins-Minerals (CENTRUM SILVER PO) Take 1 tablet by mouth daily.     . Multiple Vitamins-Minerals (EMERGEN-C VITAMIN C PO) Take 1 Package by mouth daily.     Marland Kitchen olmesartan (BENICAR) 20 MG tablet Take 20 mg by mouth every morning.    Marland Kitchen olopatadine (PATANOL) 0.1 % ophthalmic solution Place 1 drop into both eyes daily.     . polyethylene glycol (MIRALAX / GLYCOLAX) packet Take 17 g by mouth daily.    . Polyvinyl Alcohol-Povidone (REFRESH OP) Place 1 drop into  both eyes daily.     . ranitidine (ZANTAC) 150 MG tablet Take 150 mg by mouth every evening.     . rosuvastatin (CRESTOR) 10 MG tablet Take 10 mg by mouth every evening.    . vitamin B-6 (PYRIDOXINE) 25 MG tablet Take 25 mg by mouth daily.     No  current facility-administered medications for this visit.     Allergies as of 10/17/2017 - Review Complete 10/17/2017  Allergen Reaction Noted  . Codeine Nausea And Vomiting 12/18/2012  . Macrobid [nitrofurantoin macrocrystal]  12/18/2012  . Tramadol Nausea And Vomiting 12/18/2012    Vitals: BP 125/60 (BP Location: Left Arm, Patient Position: Sitting)   Pulse 93   Ht 5\' 3"  (1.6 m)   Wt 226 lb 12.8 oz (102.9 kg)   BMI 40.18 kg/m  Last Weight:  Wt Readings from Last 1 Encounters:  10/17/17 226 lb 12.8 oz (102.9 kg)   Last Height:   Ht Readings from Last 1 Encounters:  10/17/17 5\' 3"  (1.6 m)   Physical exam: Exam: Gen: NAD, conversant, well nourised, obese, well groomed                     CV: RRR, no MRG. No Carotid Bruits. No peripheral edema, warm, nontender Eyes: Conjunctivae clear without exudates or hemorrhage  Neuro: Detailed Neurologic Exam  Speech:    Speech is normal; fluent and spontaneous with normal comprehension.  Cognition:    The patient is oriented to person, place, and time;     recent and remote memory intact;     language fluent;     normal attention, concentration,     fund of knowledge Cranial Nerves:    The pupils are equal, round, and reactive to light. . Visual fields are full to finger confrontation. Extraocular movements are intact. Trigeminal sensation is intact and the muscles of mastication are normal. The face is symmetric. The palate elevates in the midline. Hearing intact. Voice is normal. Shoulder shrug is normal. The tongue has normal motion without fasciculations.   Coordination:    Normal finger to nose and heel to shin.   Motor Observation:    No asymmetry, no atrophy, and no  involuntary movements noted. Tone:    Normal muscle tone.    Posture:    Posture is normal. normal erect    Strength: Left lower extremity proximal weakness otherwise strength is V/V in the upper and lower limbs.      Sensation: intact to LT       Assessment/Plan: Patient here for recurrent Vertigo also other associated symptoms such as imbalance, right ear pain, left leg weakness need MRI of the brain to evaluate for strokes, schwannoma or any other masses or lesions intracranially.  - MRI brain w/wo contrast as above - Vestibular therapy - fall precautions - declines any vertigo medication - CTS: improved  Orders Placed This Encounter  Procedures  . MR BRAIN W WO CONTRAST  . Basic Metabolic Panel  . Ambulatory referral to Physical Therapy     Sarina Ill, MD  [facility] [address] [place], [place] [address]  Phone [phone] Fax [phone] Sarina Ill B  A total of 25 minutes was spent face-to-face with this patient. Over half this time was spent on counseling patient on the vertigo diagnosis and different diagnostic and therapeutic options.

## 2017-10-18 LAB — BASIC METABOLIC PANEL
BUN/Creatinine Ratio: 22 (ref 12–28)
BUN: 15 mg/dL (ref 8–27)
CO2: 24 mmol/L (ref 20–29)
CREATININE: 0.69 mg/dL (ref 0.57–1.00)
Calcium: 11.1 mg/dL — ABNORMAL HIGH (ref 8.7–10.3)
Chloride: 106 mmol/L (ref 96–106)
GFR calc Af Amer: 96 mL/min/{1.73_m2} (ref >59)
GFR calc non Af Amer: 84 mL/min/{1.73_m2} (ref >59)
GLUCOSE: 96 mg/dL (ref 65–99)
Potassium: 4.1 mmol/L (ref 3.5–5.2)
SODIUM: 143 mmol/L (ref 134–144)

## 2017-10-21 ENCOUNTER — Telehealth: Payer: Self-pay | Admitting: Neurology

## 2017-10-21 NOTE — Telephone Encounter (Signed)
Called the pt, no answer. LVM for the pt to call back. When pt returns call please make her aware that lab work was normal and there were no concerns.  Thanks

## 2017-10-21 NOTE — Telephone Encounter (Signed)
Advised patient of normal labs per previous message.

## 2017-10-21 NOTE — Telephone Encounter (Signed)
-----   Message from Melvenia Beam, MD sent at 10/20/2017  8:17 PM EDT ----- Unremarkable labs thanks

## 2017-10-23 ENCOUNTER — Other Ambulatory Visit: Payer: Self-pay

## 2017-10-23 ENCOUNTER — Encounter: Payer: Self-pay | Admitting: Physical Therapy

## 2017-10-23 ENCOUNTER — Ambulatory Visit: Payer: Medicare Other | Attending: Neurology | Admitting: Physical Therapy

## 2017-10-23 DIAGNOSIS — R42 Dizziness and giddiness: Secondary | ICD-10-CM | POA: Diagnosis present

## 2017-10-23 DIAGNOSIS — R293 Abnormal posture: Secondary | ICD-10-CM | POA: Diagnosis present

## 2017-10-23 NOTE — Therapy (Signed)
[facility] [address]  [place], [place], [address] Phone: [phone]   Fax:  [phone]  Physical Therapy Evaluation  Patient Details  Name: [name] MRN: [id number] Date of Birth: 1941/02/25 Referring Provider: Dr. Sarina Ill   Encounter Date: 10/23/2017  PT End of Session - 10/23/17 1749    Visit Number  1    Number of Visits  6    Date for PT Re-Evaluation  12/04/17    PT Start Time  7510    PT Stop Time  1427    PT Time Calculation (min)  25 min    Activity Tolerance  Patient tolerated treatment well    Behavior During Therapy  [facility] for tasks assessed/performed       Past Medical History:  Diagnosis Date  . Arthritis    RIGHT KNEE PAIN AND OA  . Chest pain 05/03/2015  . Elevated cholesterol   . GERD (gastroesophageal reflux disease)   . Hypertension   . Hypothyroidism   . IBS (irritable bowel syndrome)   . Rotator cuff disorder    BOTH SHOULDERS - NO SURGERY - AND STATES LIMITATIONS IN ARM MOVEMENT  . Shortness of breath 05/03/2015  . Swelling    CHRONIC SWELLING RIGHT HAND AND BOTH FEET AND BOTH LEGS  . Vertigo     Past Surgical History:  Procedure Laterality Date  . ABDOMINAL HYSTERECTOMY    . BREAST SURGERY     RIGHT BREAST BIOPSY - BENIGN  . CHOLECYSTECTOMY    . EYE SURGERY     BILATERAL CATARACT EXTRACTIONS; SURGERY FOR MACULAR HOLE   . HEMORRHOID SURGERY    . 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   Subjective Assessment - 10/23/17 1402    Subjective  patient reporitng dizziness in bed when rolling over - mostly R sided. Reports no dizziness with walking, or wlaking in busy areas. Some light headedness with positional changes. Does do water aerobics and limits activities. Symptoms 3 years aog  - was treated and now back for ~1 month. Symptoms very short lived ~30 sec when rolling. Having an MRI next week to rule out.     Pertinent History  h/o vertigo    Diagnostic tests  MRI - next week    Patient Stated Goals  improve dizziness    Currently in Pain?  No/denies         [facility] PT Assessment - 10/23/17 1405      Assessment   Medical Diagnosis  Vertigo    Referring Provider  Dr. Sarina Ill    Onset Date/Surgical Date  -- ~1 month    Next MD Visit  prn      Precautions   Precautions  None      Balance Screen   Has the patient fallen in the past 6 months  No    Has the patient had a decrease in activity level because of a fear of falling?   No    Is the patient reluctant to leave their home because of a fear of falling?   No      Prior Function   Level of Independence  Independent      Cognition   Overall Cognitive Status  Within Functional Limits for tasks assessed  Vestibular Assessment - 10/23/17 1406      Vestibular Assessment   General Observation  glasses for reading      Symptom Behavior   Type of Dizziness  Spinning    Frequency of Dizziness  -- rolling over in bed    Duration of Dizziness  30 sec    Aggravating Factors  Rolling to right;Rolling to left    Relieving Factors  Closing eyes;Rest      Occulomotor Exam   Occulomotor Alignment  Normal    Spontaneous  Absent    Head shaking Horizontal  Absent    Smooth Pursuits  Intact    Saccades  Poor trajectory;Slow      Positional Testing   Dix-Hallpike  Dix-Hallpike Right      Dix-Hallpike Right   Dix-Hallpike Right Duration  15-20 sec    Dix-Hallpike Right Symptoms  -- nystagmus      Cognition   Cognition Orientation Level  Oriented x 4      Balancemaster   Balancemaster Comment  narrow BOS - normal; tandem: normal SLS: B LE ~ 3 seconds          Objective measurements completed on examination: See above findings.       Vestibular Treatment/Exercise - 10/23/17  0001      Vestibular Treatment/Exercise   Vestibular Treatment Provided  Canalith Repositioning;Gaze    Canalith Repositioning  Epley Manuever Right    Gaze Exercises  X1 Viewing Horizontal;X1 Viewing Vertical       EPLEY MANUEVER RIGHT   Number of Reps   2    Overall Response  Improved Symptoms      X1 Viewing Horizontal   Foot Position  -- seated    Time  -- 20-30 sec    Reps  2      X1 Viewing Vertical   Foot Position  -- seated    Time  -- 20-30 sec    Reps  2            PT Education - 10/23/17 1655    Education provided  Yes    Education Details  exam findings, POC, HEP    Person(s) Educated  Patient    Methods  Explanation;Demonstration;Handout    Comprehension  Verbalized understanding;Returned demonstration          PT Long Term Goals - 10/23/17 1752      PT LONG TERM GOAL #1   Title  patient to be independent with advanced HEP    Status  New    Target Date  12/04/17      PT LONG TERM GOAL #2   Title  patient to report resolution of dizziness symptoms with rolling in bed    Status  New    Target Date  12/04/17      PT LONG TERM GOAL #3   Title  patient to improve B SLS to >/= 8 sec    Status  New    Target Date  12/04/17      PT LONG TERM GOAL #4   Title  patient to test negative on all positional testing     Status  New    Target Date  12/04/17             Plan - 10/23/17 1749    Clinical Impression Statement  Ms. Magnussen is a pleasant 77 y/o female presenting to OPPT today regarding primary complaints of vertigo that has persisted for ~1 month. Symptoms most aggravted  with rolling over in bed (R>L). Patient today with normal ocular motion with saccades and smooth pursuit, however slight poor trajectory with increased speed of saccades. Right Dix-Hallpike positive today with noted nystagmusc - taken through canalith repositioning x 2 today with near resolution of symptoms following second maneuver. Patient also given gaze stabilization work  for full benefit.carryover. Patient to follow-up next week for hopeful progression of gaze stabilization and complete resolution of vertigo symptoms.     Clinical Presentation  Stable    Clinical Decision Making  Low    Rehab Potential  Good    PT Frequency  1x / week    PT Duration  6 weeks    PT Treatment/Interventions  ADLs/Self Care Home Management;Canalith Repostioning;Cryotherapy;Electrical Stimulation;Moist Heat;Traction;Therapeutic exercise;Therapeutic activities;Balance training;Neuromuscular re-education;Patient/family education;Manual techniques    Consulted and Agree with Plan of Care  Patient       Patient will benefit from skilled therapeutic intervention in order to improve the following deficits and impairments:  Dizziness, Impaired vision/preception, Postural dysfunction  Visit Diagnosis: Dizziness and giddiness  Abnormal posture     Problem List Patient Active Problem List   Diagnosis Date Noted  . Chest pain 05/03/2015  . Shortness of breath 05/03/2015  . Dizziness and giddiness 05/18/2014  . 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 Vertigo 01/06/2014  . Postoperative anemia due to acute blood loss 12/30/2012  . OA (osteoarthritis) of knee 12/29/2012     Lanney Gins, PT, DPT 10/23/17 5:55 PM   [facility] [address]  [place], [place], [address] Phone: [phone]   Fax:  [phone]  Name: [name] MRN: [id number] Date of Birth: 09-06-1940

## 2017-10-23 NOTE — Patient Instructions (Signed)
Gaze Stabilization: Sitting   Keeping eyes on target on wall \\_10N  feet away, tilt head down 15-30 and move head side to side for __20-30__ seconds. Repeat while moving head up and down for _20-30___ seconds. Do __3-5__ sessions per day.

## 2017-10-29 ENCOUNTER — Ambulatory Visit
Admission: RE | Admit: 2017-10-29 | Discharge: 2017-10-29 | Disposition: A | Discharge status: Home / Self Care | Payer: Medicare Other | Source: Ambulatory Visit | Attending: Neurology | Admitting: Neurology

## 2017-10-29 DIAGNOSIS — R51 Headache: Secondary | ICD-10-CM

## 2017-10-29 DIAGNOSIS — R2689 Other abnormalities of gait and mobility: Secondary | ICD-10-CM | POA: Diagnosis not present

## 2017-10-29 DIAGNOSIS — R29898 Other symptoms and signs involving the musculoskeletal system: Secondary | ICD-10-CM

## 2017-10-29 DIAGNOSIS — R519 Headache, unspecified: Secondary | ICD-10-CM

## 2017-10-29 DIAGNOSIS — H9201 Otalgia, right ear: Secondary | ICD-10-CM

## 2017-10-29 DIAGNOSIS — R270 Ataxia, unspecified: Secondary | ICD-10-CM

## 2017-10-29 DIAGNOSIS — A881 Epidemic vertigo: Secondary | ICD-10-CM

## 2017-10-29 MED ORDER — GADOBENATE DIMEGLUMINE 529 MG/ML IV SOLN
20.0000 mL | Freq: Once | INTRAVENOUS | Status: AC | PRN
  Administered 2017-10-29: 20 mL via INTRAVENOUS

## 2017-10-30 ENCOUNTER — Ambulatory Visit: Payer: Medicare Other | Admitting: Physical Therapy

## 2017-10-30 ENCOUNTER — Encounter: Payer: Self-pay | Admitting: Physical Therapy

## 2017-10-30 DIAGNOSIS — R42 Dizziness and giddiness: Secondary | ICD-10-CM | POA: Diagnosis not present

## 2017-10-30 DIAGNOSIS — R293 Abnormal posture: Secondary | ICD-10-CM

## 2017-10-30 NOTE — Therapy (Addendum)
[facility] [address]  [place], [place], [address] Phone: [phone]   Fax:  [phone]  Physical Therapy Treatment  Patient Details  Name: [name] MRN: [id number] Date of Birth: September 14, 1940 Referring Provider: Dr. Sarina Ill   Encounter Date: 10/30/2017  PT End of Session - 10/30/17 0933    Visit Number  2    Number of Visits  6    Date for PT Re-Evaluation  12/04/17    PT Start Time  0928    PT Stop Time  0952    PT Time Calculation (min)  24 min    Activity Tolerance  Patient tolerated treatment well    Behavior During Therapy  [facility] for tasks assessed/performed       Past Medical History:  Diagnosis Date  . Arthritis    RIGHT KNEE PAIN AND OA  . Chest pain 05/03/2015  . Elevated cholesterol   . GERD (gastroesophageal reflux disease)   . Hypertension   . Hypothyroidism   . IBS (irritable bowel syndrome)   . Rotator cuff disorder    BOTH SHOULDERS - NO SURGERY - AND STATES LIMITATIONS IN ARM MOVEMENT  . Shortness of breath 05/03/2015  . Swelling    CHRONIC SWELLING RIGHT HAND AND BOTH FEET AND BOTH LEGS  . Vertigo     Past Surgical History:  Procedure Laterality Date  . ABDOMINAL HYSTERECTOMY    . BREAST SURGERY     RIGHT BREAST BIOPSY - BENIGN  . CHOLECYSTECTOMY    . EYE SURGERY     BILATERAL CATARACT EXTRACTIONS; SURGERY FOR MACULAR HOLE   . HEMORRHOID SURGERY    . 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  Subjective Assessment - 10/30/17 0930    Subjective  had MRI yesterday - has not had any dizziness rolling ovre in bed since initial visit - light headed this morning - eyes feel fuzzy    Pertinent History  h/o vertigo    Diagnostic tests  MRI - await results    Patient Stated Goals  improve  dizziness    Currently in Pain?  No/denies             Vestibular Assessment - 10/30/17 0001      Occulomotor Exam   Occulomotor Alignment  Normal    Spontaneous  Absent    Head shaking Horizontal  Absent    Smooth Pursuits  Intact    Saccades  Intact      Positional Testing   Dix-Hallpike  Dix-Hallpike Right      Dix-Hallpike Right   Dix-Hallpike Right Symptoms  No nystagmus no dizziness      Cognition   Cognition Orientation Level  Oriented x 4                Vestibular Treatment/Exercise - 10/30/17 0001      Vestibular Treatment/Exercise   Vestibular Treatment Provided  Canalith Repositioning;Gaze    Canalith Repositioning  Epley Manuever Right    Gaze Exercises  X2 Viewing Horizontal;X2 Viewing Vertical       EPLEY MANUEVER RIGHT   Number of Reps   2    Overall Response  Symptoms Resolved    Response Details   no nystagmus or onset of dizziness following maneuver  X2 Viewing Horizontal   Foot Position  seated    Time  -- 30 sec    Reps  2     Comments  VC/demo for correct form      X2 Viewing Vertical   Foot Position  seated    Time  -- 30 sec    Reps  2    Comments  VC/demo for correct form                 PT Long Term Goals - 10/30/17 0934      PT LONG TERM GOAL #1   Title  patient to be independent with advanced HEP    Status  Achieved      PT LONG TERM GOAL #2   Title  patient to report resolution of dizziness symptoms with rolling in bed    Status  Achieved      PT LONG TERM GOAL #3   Title  patient to improve B SLS to >/= 8 sec    Status  Partially Met      PT LONG TERM GOAL #4   Title  patient to test negative on all positional testing     Status  Achieved            Plan - 10/30/17 0934    Clinical Impression Statement  Ms. Luciana Axe with MRI yesterday - awaiting results. Patient reporting resolution of dizzy symptoms when rolling over in bed, but feels light headed today - thinks this may be due to  contrast used in MRI yesterday. PT assessing orthostatic BP in session as she reports her symptoms during positional changes. BP remaining stable throughout session with no findings of orthostatic BP. Patient also with negative findings with Dix-Hallpike, but did take pateint through repositioning maneuver in the event that testing may provoke prior symptoms. Patient also educated today on VOR x 2 with good tolerance and understanding. All goals met at this time other than SL stance goals - unsure of ability to to do this prior to onset of symptoms. Will plan for 30 day hold at this time in the event patient needs to return. Hopeful for d/c in 30 days.     PT Treatment/Interventions  ADLs/Self Care Home Management;Canalith Repostioning;Cryotherapy;Electrical Stimulation;Moist Heat;Traction;Therapeutic exercise;Therapeutic activities;Balance training;Neuromuscular re-education;Patient/family education;Manual techniques    Consulted and Agree with Plan of Care  Patient       Patient will benefit from skilled therapeutic intervention in order to improve the following deficits and impairments:  Dizziness, Impaired vision/preception, Postural dysfunction  Visit Diagnosis: Dizziness and giddiness  Abnormal posture     Problem List Patient Active Problem List   Diagnosis Date Noted  . Chest pain 05/03/2015  . Shortness of breath 05/03/2015  . Dizziness and giddiness 05/18/2014  . 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 Vertigo 01/06/2014  . Postoperative anemia due to acute blood loss 12/30/2012  . OA (osteoarthritis) of knee 12/29/2012     Lanney Gins, PT, DPT 10/30/17 10:00 AM  PHYSICAL THERAPY DISCHARGE SUMMARY  Visits from Start of Care: 2  Current functional level related to goals / functional outcomes: See above   Remaining deficits: see above   Education / Equipment: HEP  Plan: Patient agrees to discharge.  Patient goals were met. Patient is being discharged due to  being pleased with the current functional level.  ?????     Lanney Gins, PT, DPT 12/05/17 8:40 AM   [facility]  [address]  [place], [place], [address] Phone: [phone]   Fax:  [phone]  Name: [name] MRN: [id number] Date of Birth: Feb 19, 1941

## 2017-10-31 LAB — HEMOGLOBIN A1C: HEMOGLOBIN A1C: 6.8

## 2017-10-31 LAB — BASIC METABOLIC PANEL
BUN: 12 (ref 4–21)
Creatinine: 0.8 (ref 0.5–1.1)
Glucose: 112
Potassium: 4.2 (ref 3.4–5.3)
Sodium: 143 (ref 137–147)

## 2017-10-31 LAB — TSH: TSH: 0.54 (ref 0.41–5.90)

## 2017-11-07 ENCOUNTER — Telehealth: Payer: Self-pay | Admitting: *Deleted

## 2017-11-07 NOTE — Telephone Encounter (Addendum)
Called pt & LVM (ok per DPR) asking for call back to discuss the results of her MRI of her brain. Left office number for call back.    ----- Message from Melvenia Beam, MD sent at 11/05/2017 10:09 AM EDT ----- MRI of the brain is fine. 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 We can send her to Dr Ernesto Rutherford if she likes thanks

## 2017-11-07 NOTE — Telephone Encounter (Signed)
Pt returned RN's call °

## 2017-11-07 NOTE — Telephone Encounter (Signed)
Called pt back and LVM (ok per DPR) informing patient that her MRI of her brain is normal. 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 Dr. Jaynee Eagles is not sure if this is significant or not so she would like for the patient to review it with pcp or we can send her ENT for evaluation. We can send her to Dr Ernesto Rutherford if she likes. Asked for the patient to please call us back and let us know what she thinks about a possible referral. Left office number in message. Informed pt that she could call back and speak with on call MD if needed if she is unable to call before office closes.

## 2017-11-11 NOTE — Telephone Encounter (Signed)
Called patient again and LVM asking for call back. Left office number in message.

## 2017-11-12 NOTE — Telephone Encounter (Signed)
Called pt once more and LVM (ok per DPR) asking for call back to discuss her MRI results. Left office number in message. Informed pt that if we do not hear back from her we will send a letter to her home.

## 2017-11-13 NOTE — Telephone Encounter (Signed)
Pt returning RN's call.

## 2017-11-13 NOTE — Telephone Encounter (Signed)
Spoke with patient. 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 She had been having pain in the side of her face, went to urgent care and had an ultrasound. They thought it was a gland problem so they sent the referral to ENT for evaluation. She will see them Monday. She will pick up a copy of her MRI from GI to take to the ENT on Monday. RN gave her the number for GI. Pt was very appreciative.

## 2018-04-06 DIAGNOSIS — I10 Essential (primary) hypertension: Secondary | ICD-10-CM

## 2018-04-06 DIAGNOSIS — E039 Hypothyroidism, unspecified: Secondary | ICD-10-CM | POA: Insufficient documentation

## 2018-04-06 DIAGNOSIS — R7309 Other abnormal glucose: Secondary | ICD-10-CM | POA: Insufficient documentation

## 2018-04-06 DIAGNOSIS — E785 Hyperlipidemia, unspecified: Secondary | ICD-10-CM

## 2018-04-11 ENCOUNTER — Other Ambulatory Visit: Payer: Self-pay | Admitting: Internal Medicine

## 2018-04-15 ENCOUNTER — Other Ambulatory Visit: Payer: Self-pay | Admitting: Internal Medicine

## 2018-04-16 ENCOUNTER — Telehealth: Payer: Self-pay | Admitting: Neurology

## 2018-04-16 ENCOUNTER — Other Ambulatory Visit: Payer: Self-pay | Admitting: Neurology

## 2018-04-16 DIAGNOSIS — R42 Dizziness and giddiness: Secondary | ICD-10-CM

## 2018-04-16 NOTE — Telephone Encounter (Signed)
Called pt and informed her that referral was ordered for PT for vestibular therapy. Pt verbalized appreciation and will be in touch with therapy office. She is aware that they will call her to setup appt.

## 2018-04-16 NOTE — Telephone Encounter (Signed)
Ordered. thanks

## 2018-04-16 NOTE — Telephone Encounter (Signed)
Pt requesting a call stating she would like another referral to PT to help with her Vertigo. Please advise

## 2018-04-29 ENCOUNTER — Ambulatory Visit: Payer: Medicare Other | Attending: Neurology | Admitting: Physical Therapy

## 2018-04-29 ENCOUNTER — Other Ambulatory Visit: Payer: Self-pay

## 2018-04-29 VITALS — BP 134/66 | HR 78

## 2018-04-29 DIAGNOSIS — R293 Abnormal posture: Secondary | ICD-10-CM | POA: Diagnosis present

## 2018-04-29 DIAGNOSIS — R42 Dizziness and giddiness: Secondary | ICD-10-CM | POA: Diagnosis present

## 2018-04-29 DIAGNOSIS — R2689 Other abnormalities of gait and mobility: Secondary | ICD-10-CM | POA: Insufficient documentation

## 2018-04-29 NOTE — Therapy (Signed)
[facility] [address]  [place], [place], [address] Phone: [phone]   Fax:  [phone]  Physical Therapy Evaluation  Patient Details  Name: [name] MRN: [id number] Date of Birth: Oct 14, 1940 Referring Provider (PT): Sarina Ill, MD   Encounter Date: 04/29/2018  PT End of Session - 04/29/18 1106    Visit Number  1    Number of Visits  8    Date for PT Re-Evaluation  05/27/18    Authorization Type  [employer] Medicare    PT Start Time  1106    PT Stop Time  1144    PT Time Calculation (min)  38 min    Activity Tolerance  Patient tolerated treatment well    Behavior During Therapy  [facility] for tasks assessed/performed       Past Medical History:  Diagnosis Date  . Arthritis    RIGHT KNEE PAIN AND OA  . Chest pain 05/03/2015  . Elevated cholesterol   . GERD (gastroesophageal reflux disease)   . Hypertension   . Hypothyroidism   . IBS (irritable bowel syndrome)   . Rotator cuff disorder    BOTH SHOULDERS - NO SURGERY - AND STATES LIMITATIONS IN ARM MOVEMENT  . Shortness of breath 05/03/2015  . Swelling    CHRONIC SWELLING RIGHT HAND AND BOTH FEET AND BOTH LEGS  . Vertigo     Past Surgical History:  Procedure Laterality Date  . ABDOMINAL HYSTERECTOMY    . BREAST SURGERY     RIGHT BREAST BIOPSY - BENIGN  . CHOLECYSTECTOMY    . EYE SURGERY     BILATERAL CATARACT EXTRACTIONS; SURGERY FOR MACULAR HOLE   . HEMORRHOID SURGERY    . 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 States the spinning feeling is now gone, but continues to have a  lightheaded feeling most often triggered with sitting up to get out of bed or when doing water aerobics. Pt also reports she feels a little SOB today as she did not take her fluid pill before coming so as to avoid having to go to the bathroom.    Patient Stated Goals  "To be able to roll over in bed and participate in water aerobics w/o dizziness"    Currently in Pain?  No/denies         [facility] PT Assessment - 04/29/18 1106      Assessment   Medical Diagnosis  Vertigo    Referring Provider (PT)  Sarina Ill, MD    Next MD Visit  none scheduled    Prior Therapy  PT for vertigo in April 2019      Balance Screen   Has the patient fallen in the past 6 months  No    Has the patient had a decrease in activity level because of a fear of falling?   No    Is the patient reluctant to leave their home because of a fear of falling?   No      Home Film/video editor residence  Living Arrangements  Alone    Type of [place] Access  Level entry    Home Layout  One level      Prior Function   Level of [place]  Retired    Leisure  water aerobic 3-4x/wk, read      Cognition   Overall Cognitive Status  Within Functional Limits for tasks assessed      Posture/Postural Control   Posture/Postural Control  Postural limitations    Postural Limitations  Forward head;Rounded Shoulders           Vestibular Assessment - 04/29/18 1106      Vestibular Assessment   General Observation  glasses for reading      Symptom Behavior   Type of Dizziness  Lightheadedness    Frequency of Dizziness  1-2x/wk    Duration of Dizziness  ~15 sec    Aggravating Factors  Supine to sit;Rolling to left   water aerobics   Relieving Factors  Rest;Closing eyes   or opening eyes wide     Occulomotor Exam   Occulomotor Alignment  Abnormal   slight inward rotation of L eye   Spontaneous  Absent    Gaze-induced  Absent    Head shaking  Horizontal  Absent    Head Shaking Vertical  Absent    Smooth Pursuits  Intact    Saccades  Poor trajectory;Slow      Positional Testing   Dix-Hallpike  Dix-Hallpike Right;Dix-Hallpike Left    Horizontal Canal Testing  Horizontal Canal Right;Horizontal Canal Left      Dix-Hallpike Right   Dix-Hallpike Right Duration  negative      Dix-Hallpike Left   Dix-Hallpike Left Duration  negative      Horizontal Canal Right   Horizontal Canal Right Duration  negative      Horizontal Canal Left   Horizontal Canal Left Duration  negative          Objective measurements completed on examination: See above findings.       Vestibular Treatment/Exercise - 04/29/18 1106      Vestibular Treatment/Exercise   Vestibular Treatment Provided  Gaze    Gaze Exercises  X1 Viewing Horizontal;X1 Viewing Vertical;Eye/Head Exercise Horizontal      X1 Viewing Horizontal   Foot Position  seated    Reps  2   30 sec     X1 Viewing Vertical   Foot Position  seated    Reps  2   30 sec     Eye/Head Exercise Horizontal   Foot Position  seated    Reps  10                 PT Long Term Goals - 04/29/18 1743      PT LONG TERM GOAL #1   Title  Patient to be independent with advanced vestibular HEP    Status  New    Target Date  05/27/18      PT LONG TERM GOAL #2   Title  Patient to report resolution of dizziness/lightheadedness symptoms with rolling in bed    Status  New    Target Date  05/27/18      PT LONG TERM GOAL #3   Title  Patient will verbalize understanding of fall precautions to reduce rsik for fall in home    Status  New    Target Date  05/27/18      PT LONG  TERM GOAL #4   Title  Patient will report ability to participate in water aerobics w/o limitation due to dizziness or lightheadedness    Status  New    Target Date  05/27/18             Plan - 04/29/18 1144    Clinical Impression Statement  [name] is a 77 y/o female presenting to OP PT for vertigo  originating ~3-4 weeks ago. She reports dizziness initially present as a "spinning" feeling with rolling over in bed, esp rolling from R to L, but now describes more of a lightheaded feeling. Symptoms now occurring 1-2x/wk and most commonly aggravated with rolling over in bed but also occasionally notes symptoms during water aerobics classes.). Patient today with normal ocular motion with saccades and smooth pursuit, however slight poor trajectory with increased speed of saccades. 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 [name] will benefit from skilled PT to address the above listed deficits/complaints to allow for improved QOL and return to daily activities and exercise without limitations.      History and Personal Factors relevant to plan of care:  HTN; hypothyroidism; OA; TKR; RTC disorder    Clinical Presentation  Evolving    Clinical Presentation due to:  Evolving symptomology with Initial presentation of apparent BPPV, now more vestibular hypofunction + complex medical history    Clinical Decision Making  Moderate    Rehab Potential  Good    PT Frequency  2x / week    PT Duration  4 weeks    PT Treatment/Interventions  Patient/family education;ADLs/Self Care Home Management;Canalith Repostioning;Vestibular;Manual techniques;Neuromuscular re-education;Balance training;Therapeutic activities;Functional mobility training;Visual/perceptual remediation/compensation    Consulted and Agree with Plan of Care  Patient       Patient will benefit from skilled therapeutic intervention in order to improve the following deficits and impairments:  Dizziness, Impaired vision/preception, Postural dysfunction, Decreased activity tolerance, Decreased balance  Visit Diagnosis: Dizziness and giddiness  Abnormal posture  Other abnormalities of gait and mobility     Problem List Patient Active Problem List    Diagnosis Date Noted  . Hypothyroidism 04/06/2018  . Hyperlipidemia 04/06/2018  . Essential hypertension 04/06/2018  . Abnormal glucose 04/06/2018  . Chest pain 05/03/2015  . Shortness of breath 05/03/2015  . Dizziness and giddiness 05/18/2014  . 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 Vertigo 01/06/2014  . Postoperative anemia due to acute blood loss 12/30/2012  . OA (osteoarthritis) of knee 12/29/2012    Percival Spanish, PT, MPT 04/29/2018, 5:53 PM  [facility] [address]  [place], [place], [address] Phone: [phone]   Fax:  [phone]  Name: [name] MRN: [id number] Date of Birth: [birth year]

## 2018-05-01 ENCOUNTER — Ambulatory Visit (INDEPENDENT_AMBULATORY_CARE_PROVIDER_SITE_OTHER): Payer: Medicare Other | Admitting: Internal Medicine

## 2018-05-01 ENCOUNTER — Ambulatory Visit (INDEPENDENT_AMBULATORY_CARE_PROVIDER_SITE_OTHER): Payer: Medicare Other

## 2018-05-01 ENCOUNTER — Encounter: Payer: Self-pay | Admitting: Internal Medicine

## 2018-05-01 VITALS — BP 130/62 | HR 82 | Temp 98.2°F | Ht 61.5 in | Wt 220.0 lb

## 2018-05-01 DIAGNOSIS — E7800 Pure hypercholesterolemia, unspecified: Secondary | ICD-10-CM | POA: Diagnosis not present

## 2018-05-01 DIAGNOSIS — Z7982 Long term (current) use of aspirin: Secondary | ICD-10-CM

## 2018-05-01 DIAGNOSIS — Z0000 Encounter for general adult medical examination without abnormal findings: Secondary | ICD-10-CM | POA: Diagnosis not present

## 2018-05-01 DIAGNOSIS — E559 Vitamin D deficiency, unspecified: Secondary | ICD-10-CM

## 2018-05-01 DIAGNOSIS — I10 Essential (primary) hypertension: Secondary | ICD-10-CM | POA: Diagnosis not present

## 2018-05-01 DIAGNOSIS — E039 Hypothyroidism, unspecified: Secondary | ICD-10-CM

## 2018-05-01 DIAGNOSIS — R0683 Snoring: Secondary | ICD-10-CM

## 2018-05-01 DIAGNOSIS — R7309 Other abnormal glucose: Secondary | ICD-10-CM

## 2018-05-01 LAB — POCT URINALYSIS DIPSTICK
Bilirubin, UA: NEGATIVE
GLUCOSE UA: NEGATIVE
KETONES UA: NEGATIVE
LEUKOCYTES UA: NEGATIVE
NITRITE UA: NEGATIVE
PROTEIN UA: NEGATIVE
SPEC GRAV UA: 1.02 (ref 1.010–1.025)
Urobilinogen, UA: 0.2 E.U./dL
pH, UA: 7 (ref 5.0–8.0)

## 2018-05-01 MED ORDER — BENICAR 20 MG PO TABS: 20.0000 mg | ORAL_TABLET | Freq: Every day | ORAL | 2 refills | Status: DC

## 2018-05-01 MED ORDER — CRESTOR 10 MG PO TABS
10.0000 mg | ORAL_TABLET | Freq: Every day | ORAL | 0 refills | Status: DC
Start: 2018-05-01 — End: 2019-03-12

## 2018-05-01 NOTE — Patient Instructions (Signed)
[name] , Thank you for taking time to come for your Medicare Wellness Visit. I appreciate your ongoing commitment to your health goals. Please review the following plan we discussed and let me know if I can assist you in the future.   Screening recommendations/referrals: Colonoscopy: up to date Mammogram: appt Feb. Or Mar. Bone Density: 2017 per patient Recommended yearly ophthalmology/optometry visit for glaucoma screening and checkup Recommended yearly dental visit for hygiene and checkup  Vaccinations: Influenza vaccine: 04/28/2018 Pneumococcal vaccine: 04/28/2018 Tdap vaccine: 08/15/2016 Shingles vaccine: 12/25/2017    Advanced directives: Please bring a copy of your POA (Power of [place]) and/or Living Will to your next appointment.    Conditions/risks identified: Obesity: wants to increase water intake. Is taking water aerobics four times a week.  Next appointment: 05/01/2018 at 11:00   Preventive Care 65 Years and Older, Female Preventive care refers to lifestyle choices and visits with your health care provider that can promote health and wellness. What does preventive care include?  A yearly physical exam. This is also called an annual well check.  Dental exams once or twice a year.  Routine eye exams. Ask your health care provider how often you should have your eyes checked.  Personal lifestyle choices, including:  Daily care of your teeth and gums.  Regular physical activity.  Eating a healthy diet.  Avoiding tobacco and drug use.  Limiting alcohol use.  Practicing safe sex.  Taking low-dose aspirin every day.  Taking vitamin and mineral supplements as recommended by your health care provider. What happens during an annual well check? The services and screenings done by your health care provider during your annual well check will depend on your age, overall health, lifestyle risk factors, and family history of disease. Counseling  Your health care  provider may ask you questions about your:  Alcohol use.  Tobacco use.  Drug use.  Emotional well-being.  Home and relationship well-being.  Sexual activity.  Eating habits.  History of falls.  Memory and ability to understand (cognition).  Work and work Statistician.  Reproductive health. Screening  You may have the following tests or measurements:  Height, weight, and BMI.  Blood pressure.  Lipid and cholesterol levels. These may be checked every 5 years, or more frequently if you are over 56 years old.  Skin check.  Lung cancer screening. You may have this screening every year starting at age 66 if you have a 30-pack-year history of smoking and currently smoke or have quit within the past 15 years.  Fecal occult blood test (FOBT) of the stool. You may have this test every year starting at age 62.  Flexible sigmoidoscopy or colonoscopy. You may have a sigmoidoscopy every 5 years or a colonoscopy every 10 years starting at age 4.  Hepatitis C blood test.  Hepatitis B blood test.  Sexually transmitted disease (STD) testing.  Diabetes screening. This is done by checking your blood sugar (glucose) after you have not eaten for a while (fasting). You may have this done every 1-3 years.  Bone density scan. This is done to screen for osteoporosis. You may have this done starting at age 64.  Mammogram. This may be done every 1-2 years. Talk to your health care provider about how often you should have regular mammograms. Talk with your health care provider about your test results, treatment options, and if necessary, the need for more tests. Vaccines  Your health care provider may recommend certain vaccines, such as:  Influenza vaccine.  This is recommended every year.  Tetanus, diphtheria, and acellular pertussis (Tdap, Td) vaccine. You may need a Td booster every 10 years.  Zoster vaccine. You may need this after age 84.  Pneumococcal 13-valent conjugate (PCV13)  vaccine. One dose is recommended after age 64.  Pneumococcal polysaccharide (PPSV23) vaccine. One dose is recommended after age 26. Talk to your health care provider about which screenings and vaccines you need and how often you need them. This information is not intended to replace advice given to you by your health care provider. Make sure you discuss any questions you have with your health care provider. Document Released: 07/22/2015 Document Revised: 03/14/2016 Document Reviewed: 04/26/2015 Elsevier Interactive Patient Education  2017 [place] Prevention in the Home Falls can cause injuries. They can happen to people of all ages. There are many things you can do to make your home safe and to help prevent falls. What can I do on the outside of my home?  Regularly fix the edges of walkways and driveways and fix any cracks.  Remove anything that might make you trip as you walk through a door, such as a raised step or threshold.  Trim any bushes or trees on the path to your home.  Use bright outdoor lighting.  Clear any walking paths of anything that might make someone trip, such as rocks or tools.  Regularly check to see if handrails are loose or broken. Make sure that both sides of any steps have handrails.  Any raised decks and porches should have guardrails on the edges.  Have any leaves, snow, or ice cleared regularly.  Use sand or salt on walking paths during winter.  Clean up any spills in your garage right away. This includes oil or grease spills. What can I do in the bathroom?  Use night lights.  Install grab bars by the toilet and in the tub and shower. Do not use towel bars as grab bars.  Use non-skid mats or decals in the tub or shower.  If you need to sit down in the shower, use a plastic, non-slip stool.  Keep the floor dry. Clean up any water that spills on the floor as soon as it happens.  Remove soap buildup in the tub or shower  regularly.  Attach bath mats securely with double-sided non-slip rug tape.  Do not have throw rugs and other things on the floor that can make you trip. What can I do in the bedroom?  Use night lights.  Make sure that you have a light by your bed that is easy to reach.  Do not use any sheets or blankets that are too big for your bed. They should not hang down onto the floor.  Have a firm chair that has side arms. You can use this for support while you get dressed.  Do not have throw rugs and other things on the floor that can make you trip. What can I do in the kitchen?  Clean up any spills right away.  Avoid walking on wet floors.  Keep items that you use a lot in easy-to-reach places.  If you need to reach something above you, use a strong step stool that has a grab bar.  Keep electrical cords out of the way.  Do not use floor polish or wax that makes floors slippery. If you must use wax, use non-skid floor wax.  Do not have throw rugs and other things on the floor that can make  you trip. What can I do with my stairs?  Do not leave any items on the stairs.  Make sure that there are handrails on both sides of the stairs and use them. Fix handrails that are broken or loose. Make sure that handrails are as long as the stairways.  Check any carpeting to make sure that it is firmly attached to the stairs. Fix any carpet that is loose or worn.  Avoid having throw rugs at the top or bottom of the stairs. If you do have throw rugs, attach them to the floor with carpet tape.  Make sure that you have a light switch at the top of the stairs and the bottom of the stairs. If you do not have them, ask someone to add them for you. What else can I do to help prevent falls?  Wear shoes that:  Do not have high heels.  Have rubber bottoms.  Are comfortable and fit you well.  Are closed at the toe. Do not wear sandals.  If you use a stepladder:  Make sure that it is fully  opened. Do not climb a closed stepladder.  Make sure that both sides of the stepladder are locked into place.  Ask someone to hold it for you, if possible.  Clearly mark and make sure that you can see:  Any grab bars or handrails.  First and last steps.  Where the edge of each step is.  Use tools that help you move around (mobility aids) if they are needed. These include:  Canes.  Walkers.  Scooters.  Crutches.  Turn on the lights when you go into a dark area. Replace any light bulbs as soon as they burn out.  Set up your furniture so you have a clear path. Avoid moving your furniture around.  If any of your floors are uneven, fix them.  If there are any pets around you, be aware of where they are.  Review your medicines with your doctor. Some medicines can make you feel dizzy. This can increase your chance of falling. Ask your doctor what other things that you can do to help prevent falls. This information is not intended to replace advice given to you by your health care provider. Make sure you discuss any questions you have with your health care provider. Document Released: 04/21/2009 Document Revised: 12/01/2015 Document Reviewed: 07/30/2014 Elsevier Interactive Patient Education  2017 [employer].

## 2018-05-01 NOTE — Progress Notes (Signed)
Subjective:   [name] is a 77 y.o. female who presents for Medicare Annual (Subsequent) preventive examination.  Review of Systems:  n/a Cardiac Risk Factors include: advanced age (>46men, >52 women);dyslipidemia;hypertension;obesity (BMI >30kg/m2)     Objective:     Vitals: BP 130/62 (BP Location: Left Arm)   Pulse 82   Temp 98.2 F (36.8 C)   Ht 5' 1.5" (1.562 m)   Wt 220 lb (99.8 kg)   SpO2 97%   BMI 40.90 kg/m   Body mass index is 40.9 kg/m.  Advanced Directives 05/01/2018 04/29/2018 10/23/2017 04/27/2016 12/10/2013 12/29/2012 12/29/2012  Does Patient Have a Medical Advance Directive? Yes Yes Yes Yes Patient has advance directive, copy not in chart Patient has advance directive, copy not in chart -  Type of Advance Directive Living will [place];Living will - [place];Living will - [place];Living will -  Does patient want to make changes to medical advance directive? - No - Patient declined No - Patient declined - - - -  Copy of [place] in Chart? - Yes - - - Copy requested from family -  Pre-existing out of facility DNR order (yellow form or pink MOST form) - - - - - No No    Tobacco Social History   Tobacco Use  Smoking Status Never Smoker  Smokeless Tobacco Never Used     Counseling given: Not Answered   Clinical Intake:  Pre-visit preparation completed: Yes  Pain : No/denies pain     Nutritional Status: BMI > 30  Obese Diabetes: No  How often do you need to have someone help you when you read instructions, pamphlets, or other written materials from your doctor or pharmacy?: 1 - Never What is the last grade level you completed in school?: 12 th grade     Information entered by :: NAllen LPN  Past Medical History:  Diagnosis Date  . Arthritis    RIGHT KNEE PAIN AND OA  . Chest pain 05/03/2015  . Elevated cholesterol   . GERD (gastroesophageal reflux disease)   .  Hypertension   . Hypothyroidism   . IBS (irritable bowel syndrome)   . Rotator cuff disorder    BOTH SHOULDERS - NO SURGERY - AND STATES LIMITATIONS IN ARM MOVEMENT  . Shortness of breath 05/03/2015  . Swelling    CHRONIC SWELLING RIGHT HAND AND BOTH FEET AND BOTH LEGS  . Vertigo    Past Surgical History:  Procedure Laterality Date  . ABDOMINAL HYSTERECTOMY    . BREAST SURGERY     RIGHT BREAST BIOPSY - BENIGN  . CHOLECYSTECTOMY    . EYE SURGERY     BILATERAL CATARACT EXTRACTIONS; SURGERY FOR MACULAR HOLE   . HEMORRHOID SURGERY    . 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 Diabetes Mother   . Dementia Mother   . Diabetes Father   . Diabetes Sister   . Breast cancer Sister   . Kidney cancer Sister   . Lung cancer Sister   . Lung cancer Maternal Aunt   . Pancreatic cancer Maternal Aunt    Social History   Socioeconomic History  . Marital status: Widowed    Spouse name: nathaniel  . Number of children: 4  . Years  of education: 14  . Highest education level: Not on file  Occupational History  . Occupation: Retired  Scientific laboratory technician  . Financial resource strain: Not hard at all  . Food insecurity:    Worry: Never true    Inability: Never true  . Transportation needs:    Medical: No    Non-medical: No  Tobacco Use  . Smoking status: Never Smoker  . Smokeless tobacco: Never Used  Substance and Sexual Activity  . Alcohol use: Yes    Comment: OCCAS ALCOHOL  . Drug use: No  . Sexual activity: Not Currently  Lifestyle  . Physical activity:    Days per week: 4 days    Minutes per session: 60 min  . Stress: Not at all  Relationships  . Social connections:    Talks on phone: Not on file    Gets together: Not on file    Attends religious service: Not on file    Active member of club  or organization: Not on file    Attends meetings of clubs or organizations: Not on file    Relationship status: Not on file  Other Topics Concern  . Not on file  Social History Narrative   [name]   Patient is retired.    Patient has 4 children.    Patient has a high school education.    Patient is widowed   Right handed      Epworth Sleepiness Scale = 1 (as of 05/03/2015)    Outpatient Encounter Medications as of 05/01/2018  Medication Sig  . Aspirin (ECOTRIN PO) Take 80 mg by mouth daily.  . bimatoprost (LUMIGAN) 0.01 % SOLN Place 1 drop into both eyes at bedtime.   . CRESTOR 10 MG tablet TAKE 1 TABLET BY MOUTH  EVERY DAY  . furosemide (LASIX) 20 MG tablet Take 20 mg by mouth every morning.  Marland Kitchen levothyroxine (SYNTHROID, LEVOTHROID) 75 MCG tablet Take 75 mcg by mouth daily before breakfast.   . Multiple Vitamins-Minerals (CENTRUM SILVER PO) Take 1 tablet by mouth daily.   Marland Kitchen olmesartan (BENICAR) 20 MG tablet Take 20 mg by mouth every morning.  Marland Kitchen olopatadine (PATANOL) 0.1 % ophthalmic solution Place 1 drop into both eyes daily.   . polyethylene glycol (MIRALAX / GLYCOLAX) packet Take 17 g by mouth daily.  . Polyvinyl Alcohol-Povidone (REFRESH OP) Place 1 drop into both eyes daily.   . ranitidine (ZANTAC) 150 MG tablet TAKE 1 TABLET BY MOUTH  EVERY EVENING  . vitamin B-6 (PYRIDOXINE) 25 MG tablet Take 25 mg by mouth daily.  . Multiple Vitamins-Minerals (EMERGEN-C VITAMIN C PO) Take 1 Package by mouth daily.    No facility-administered encounter medications on file as of 05/01/2018.     Activities of Daily Living In your present state of health, do you have any difficulty performing the following activities: 05/01/2018  Hearing? N  Vision? N  Difficulty concentrating or making decisions? N  Walking or climbing stairs? Y  Comment had knee surgery  Dressing or bathing? N  Doing errands, shopping? N  Preparing Food and eating ? N  Using the Toilet? N  In  the past six months, have you accidently leaked urine? N  Do you have problems with loss of bowel control? N  Managing your Medications? N  Managing your Finances? N  Housekeeping or managing your Housekeeping? N  Some recent data might be hidden    Patient Care Team: Glendale Chard, MD as PCP -  General (Internal Medicine)    Assessment:   This is a routine wellness examination for [place].  Exercise Activities and Dietary recommendations Current Exercise Habits: Structured exercise class, Type of exercise: Other - see comments(water aerobics), Time (Minutes): 60, Frequency (Times/Week): 4, Weekly Exercise (Minutes/Week): 240, Intensity: Moderate  Goals    . DIET - INCREASE WATER INTAKE (pt-stated)       Fall Risk Fall Risk  05/01/2018 05/01/2018 12/10/2013  Falls in the past year? No No No  Risk for fall due to : - Medication side effect -   Is the patient's home free of loose throw rugs in walkways, pet beds, electrical cords, etc?   yes      Grab bars in the bathroom? yes      Handrails on the stairs?   n/a      Adequate lighting?   yes  Timed Get Up and Go performed: n/a  Depression Screen PHQ 2/9 Scores 05/01/2018 05/01/2018 12/10/2013  PHQ - 2 Score 0 0 0  PHQ- 9 Score - 0 -     Cognitive Function     6CIT Screen 05/01/2018  What Year? 0 points  What month? 0 points  What time? 0 points  Count back from 20 0 points  Months in reverse 0 points  Repeat phrase 0 points  Total Score 0    Immunization History  Administered Date(s) Administered  . Influenza-Unspecified 04/08/2013, 04/28/2018  . Pneumococcal Conjugate-13 04/28/2018  . Pneumococcal Polysaccharide-23 08/15/2016  . Tdap 08/15/2016  . Zoster Recombinat (Shingrix) 10/30/2017, 12/25/2017    Qualifies for Shingles Vaccine?yes  Screening Tests Health Maintenance  Topic Date Due  . INFLUENZA VACCINE  05/02/2019 (Originally 02/06/2018)  . PNA vac Low Risk Adult (2 of 2 - PPSV23) 04/29/2019  .  TETANUS/TDAP  08/15/2026  . DEXA SCAN  Completed    Cancer Screenings: Lung: Low Dose CT Chest recommended if Age 51-80 years, 30 pack-year currently smoking OR have quit w/in 15years. Patient does not qualify. Breast:  Up to date on Mammogram? Yes   Up to date of Bone Density/Dexa? Yes Colorectal: up to date  Additional Screenings: : Hepatitis C Screening:  n/a    Plan:   Patient wants to increase water intake. She attends water aerobics 4 times a week.   I have personally reviewed and noted the following in the patient's chart:   . Medical and social history . Use of alcohol, tobacco or illicit drugs  . Current medications and supplements . Functional ability and status . Nutritional status . Physical activity . Advanced directives . List of other physicians . Hospitalizations, surgeries, and ER visits in previous 12 months . Vitals . Screenings to include cognitive, depression, and falls . Referrals and appointments  In addition, I have reviewed and discussed with patient certain preventive protocols, quality metrics, and best practice recommendations. A written personalized care plan for preventive services as well as general preventive health recommendations were provided to patient.     Kellie Simmering, LPN  [id number]

## 2018-05-01 NOTE — Progress Notes (Addendum)
Subjective:     Patient ID: [name] , female    DOB: [birth year] , 77 y.o.   MRN: [id number]    No LMP recorded. Patient is postmenopausal.. Negative for: breast discharge, breast lump(s), breast pain and breast self exam. Associated symptoms include abnormal vaginal bleeding. Pertinent negatives include abnormal bleeding (hematology), anxiety, decreased libido, depression, difficulty falling sleep, dyspareunia, history of infertility, nocturia, sexual dysfunction, sleep disturbances, urinary incontinence, urinary urgency, vaginal discharge and vaginal itching. Diet regular.The patient states her exercise level is  MINIMAL.  . The patient's tobacco use is:  Social History   Tobacco Use  Smoking Status Never Smoker  Smokeless Tobacco Never Used  . She has been exposed to passive smoke. The patient's alcohol use is:  Social History   Substance and Sexual Activity  Alcohol Use Yes   Comment: OCCAS ALCOHOL    Chief Complaint  Patient presents with  . Annual Exam    SHE IS HERE TODAY FOR A FULL PHYSICAL EXAM. SHE DENIES H/O CHEST PAIN, SHORTNESS OF BREATH AND HEADACHES.   Hypertension  This is a chronic problem. The current episode started more than 1 year ago. The problem has been gradually improving since onset. The problem is controlled. Risk factors for coronary artery disease include obesity and sedentary lifestyle. The current treatment provides significant improvement. Compliance problems include exercise.      Past Medical History:  Diagnosis Date  . Arthritis    RIGHT KNEE PAIN AND OA  . Chest pain 05/03/2015  . Elevated cholesterol   . GERD (gastroesophageal reflux disease)   . Hypertension   . Hypothyroidism   . IBS (irritable bowel syndrome)   . Rotator cuff disorder    BOTH SHOULDERS - NO SURGERY - AND STATES LIMITATIONS IN ARM MOVEMENT  . Shortness of breath 05/03/2015  . Swelling    CHRONIC SWELLING RIGHT HAND AND BOTH FEET AND BOTH LEGS  . Vertigo        Current Outpatient Medications:  .  Aspirin (ECOTRIN PO), Take 80 mg by mouth daily., Disp: , Rfl:  .  bimatoprost (LUMIGAN) 0.01 % SOLN, Place 1 drop into both eyes at bedtime. , Disp: , Rfl:  .  CRESTOR 10 MG tablet, TAKE 1 TABLET BY MOUTH  EVERY DAY, Disp: 90 tablet, Rfl: 0 .  furosemide (LASIX) 20 MG tablet, Take 20 mg by mouth every morning., Disp: , Rfl:  .  levothyroxine (SYNTHROID, LEVOTHROID) 75 MCG tablet, Take 75 mcg by mouth daily before breakfast. , Disp: , Rfl:  .  Multiple Vitamins-Minerals (CENTRUM SILVER PO), Take 1 tablet by mouth daily. , Disp: , Rfl:  .  Multiple Vitamins-Minerals (EMERGEN-C VITAMIN C PO), Take 1 Package by mouth daily. , Disp: , Rfl:  .  olmesartan (BENICAR) 20 MG tablet, Take 20 mg by mouth every morning., Disp: , Rfl:  .  olopatadine (PATANOL) 0.1 % ophthalmic solution, Place 1 drop into both eyes daily. , Disp: , Rfl:  .  polyethylene glycol (MIRALAX / GLYCOLAX) packet, Take 17 g by mouth daily., Disp: , Rfl:  .  Polyvinyl Alcohol-Povidone (REFRESH OP), Place 1 drop into both eyes daily. , Disp: , Rfl:  .  ranitidine (ZANTAC) 150 MG tablet, TAKE 1 TABLET BY MOUTH  EVERY EVENING, Disp: 90 tablet, Rfl: 0 .  vitamin B-6 (PYRIDOXINE) 25 MG tablet, Take 25 mg by mouth daily., Disp: , Rfl:    Allergies  Allergen Reactions  . Codeine Nausea And Vomiting  .  Macrobid [Nitrofurantoin Macrocrystal]     RASH  . Tramadol Nausea And Vomiting     Review of Systems  Constitutional: Positive for fatigue (SHE C/O DAYTIME FATIGUE. ADMITS TO SNORING. NO KNOWN H/O OSA. HAS NEVER HAD SLEEP STUDY. DOES NOT AWAKEN W/ HEADACHES. ).  HENT: Negative.   Eyes: Negative.   Respiratory: Negative.   Cardiovascular: Negative.   Gastrointestinal: Negative.   Endocrine: Negative.   Genitourinary: Negative.   Skin: Negative.   Allergic/Immunologic: Negative.   Neurological: Negative.   Hematological: Negative.   Psychiatric/Behavioral: Negative.      Today's Vitals    05/01/18 1044  BP: 130/62  Pulse: 82  Temp: 98.2 F (36.8 C)  TempSrc: Oral  Weight: 220 lb (99.8 kg)  Height: 5' 1.5" (1.562 m)  PainSc: 0-No pain   Body mass index is 40.9 kg/m.   Objective:  Physical Exam  Constitutional: She appears well-developed and well-nourished.  HENT:  Head: Normocephalic and atraumatic.  Right Ear: External ear normal.  Left Ear: External ear normal.  Nose: Nose normal.  Mouth/Throat: Oropharynx is clear and moist.  Eyes: Pupils are equal, round, and reactive to light. Conjunctivae and EOM are normal.  Neck: Normal range of motion. Neck supple.  Cardiovascular: Normal rate, regular rhythm, normal heart sounds and intact distal pulses.  Pulmonary/Chest: Effort normal and breath sounds normal. Right breast exhibits no inverted nipple, no mass, no nipple discharge, no skin change and no tenderness. Left breast exhibits no inverted nipple, no mass, no nipple discharge, no skin change and no tenderness.  Abdominal: Soft. Bowel sounds are normal. There is no tenderness.  Genitourinary:  Genitourinary Comments: DEFERRED  Skin: Skin is warm and dry.  Nursing note and vitals reviewed.       Assessment And Plan:     1. Routine general medical examination at health care facility  A FULL EXAM WAS PERFORMED. IMPORTANCE OF MONTHLY SELF BREAST EXAMS WAS DISCUSSED WITH THE PATIENT.  THE ANNUAL WELLNESS VISIT WAS PERFORMED INCLUDING DISCUSSION OF ADVANCED DIRECTIVES, ASSESSMENT OF COGNITIVE FUNCTION AND COMPLETION OF GERIATRIC FUNCTIONAL ASSESSMENT FORM.PATIENT HAS BEEN ADVISED TO GET 30-45 MINUTES REGULAR EXERCISE NO LESS THAN FOUR TO FIVE DAYS PER WEEK - BOTH WEIGHTBEARING EXERCISES AND AEROBIC ARE RECOMMENDED.  HE/SHE WAS ADVISED TO FOLLOW A HEALTHY DIET WITH AT LEAST SIX FRUITS/VEGGIES PER DAY, DECREASE INTAKE OF RED MEAT, AND TO INCREASE FISH INTAKE TO TWO DAYS PER WEEK.  MEATS/FISH SHOULD NOT BE FRIED, BAKED OR BROILED IS PREFERABLE.  I SUGGEST WEARING SPF 50  SUNSCREEN ON EXPOSED PARTS AND ESPECIALLY WHEN IN THE DIRECT SUNLIGHT FOR AN EXTENDED PERIOD OF TIME.  PLEASE AVOID FAST FOOD RESTAURANTS AND INCREASE YOUR WATER INTAKE.   2. Essential hypertension, benign  CONTROLLED. SHE WILL CONTINUE WITH CURRENT MEDS. SHE IS ENCOURAGED TO LIMIT HER SALT INTAKE. SHE WILL RTO IN SIX MONTHS FOR RE-EVALUATION.   - CBC no Diff - CMP14+EGFR  3. Pure hypercholesterolemia  I WILL CHECK A FASTING LIPID PANEL AND LFTS. SHE IS ENCOURAGED TO EXERCISE FIVE DAYS WEEKLY FOR AT LEAST 30 MINUTES, AVOID FRIED FOODS, EAT 25-35 GRAMS OF FIBER, AND TO EAT FISH AT LEAST TWICE WEEKLY.  4. Vitamin D deficiency disease  I WILL CHECK A VIT D LEVEL AND SUPPLEMENT AS NEEDED.   5. Hypercalcemia  I WILL RE-CHECK A CALCIUM LEVEL TODAY. SHE IS NOT TAKING ANY CALCIUM SUPPLEMENTS AT THIS TIME.   6. Other abnormal glucose  HER A1C HAS BEEN ELEVATED IN THE PAST. I WILL  CHECK AN A1C, BMET TODAY. SHE WAS ENCOURAGED TO AVOID SUGARY BEVERAGES AND PROCESSED FOODS INCLUDNG BREADS, RICE AND PASTA.  - Hemoglobin A1c  7. Primary hypothyroidism  I WILL CHECK A THYROID PANEL AND ADJUST MEDS AS NEEDED. IMPORTANCE OF MEDICATION COMPLIANCE WAS DISCUSSED WITH THE PATIENT.  - TSH - T4, Free   8. Snoring  I WILL REFER HER FOR A SLEEP STUDY. SHE IS IN AGREEMENT WITH HER TREATMENT PLAN.  - Ambulatory referral to Neurology  [name], MD

## 2018-05-01 NOTE — Addendum Note (Signed)
Addended by: Kellie Simmering on: 05/01/2018 02:38 PM   Modules accepted: Orders

## 2018-05-02 LAB — CMP14+EGFR
A/G RATIO: 2.3 — AB (ref 1.2–2.2)
ALK PHOS: 105 IU/L (ref 39–117)
ALT: 30 IU/L (ref 0–32)
AST: 23 IU/L (ref 0–40)
Albumin: 4.3 g/dL (ref 3.5–4.8)
BUN/Creatinine Ratio: 23 (ref 12–28)
BUN: 15 mg/dL (ref 8–27)
Bilirubin Total: 0.6 mg/dL (ref 0.0–1.2)
CHLORIDE: 105 mmol/L (ref 96–106)
CO2: 26 mmol/L (ref 20–29)
Calcium: 11.1 mg/dL — ABNORMAL HIGH (ref 8.7–10.3)
Creatinine, Ser: 0.65 mg/dL (ref 0.57–1.00)
GFR calc Af Amer: 98 mL/min/{1.73_m2} (ref >59)
GFR calc non Af Amer: 85 mL/min/{1.73_m2} (ref >59)
GLOBULIN, TOTAL: 1.9 g/dL (ref 1.5–4.5)
Glucose: 97 mg/dL (ref 65–99)
POTASSIUM: 4.2 mmol/L (ref 3.5–5.2)
SODIUM: 142 mmol/L (ref 134–144)
Total Protein: 6.2 g/dL (ref 6.0–8.5)

## 2018-05-02 LAB — CBC
HEMATOCRIT: 44.1 % (ref 34.0–46.6)
Hemoglobin: 15 g/dL (ref 11.1–15.9)
MCH: 29.8 pg (ref 26.6–33.0)
MCHC: 34 g/dL (ref 31.5–35.7)
MCV: 88 fL (ref 79–97)
Platelets: 167 10*3/uL (ref 150–450)
RBC: 5.03 x10E6/uL (ref 3.77–5.28)
RDW: 13.5 % (ref 12.3–15.4)
WBC: 4.6 10*3/uL (ref 3.4–10.8)

## 2018-05-02 LAB — TSH: TSH: 0.841 u[IU]/mL (ref 0.450–4.500)

## 2018-05-02 LAB — HEMOGLOBIN A1C
ESTIMATED AVERAGE GLUCOSE: 137 mg/dL
HEMOGLOBIN A1C: 6.4 % — AB (ref 4.8–5.6)

## 2018-05-02 LAB — T4, FREE: Free T4: 1.49 ng/dL (ref 0.82–1.77)

## 2018-05-04 NOTE — Progress Notes (Signed)
Here are your test results  Your blood count is normal. Your calcium level is elevated. Are you taking any calcium supplements? You have normal liver and kidney function.   Your a1c is 6.4, this is in prediabetes range. I suggest that you take a medication to help bring your a1c down.  How do you feel about this?  Our goal is less than 5.6, this will mean you are no longer prediabetic. I suggest that you exercise no less than four to five day weekly and avoid refined carbs like sugary beverages, white breads, rice and pasta.   Your thyroid function is normal. Please continue with current supplementation.   Please let me know if you have any questions.   Sincerely,    Kerline Trahan N. Baird [facility], MD

## 2018-05-06 ENCOUNTER — Other Ambulatory Visit: Payer: Self-pay

## 2018-05-06 ENCOUNTER — Encounter: Payer: Self-pay | Admitting: Physical Therapy

## 2018-05-06 ENCOUNTER — Ambulatory Visit: Payer: Medicare Other | Admitting: Physical Therapy

## 2018-05-06 VITALS — BP 128/70 | HR 77

## 2018-05-06 DIAGNOSIS — R42 Dizziness and giddiness: Secondary | ICD-10-CM | POA: Diagnosis not present

## 2018-05-06 DIAGNOSIS — R293 Abnormal posture: Secondary | ICD-10-CM

## 2018-05-06 DIAGNOSIS — R2689 Other abnormalities of gait and mobility: Secondary | ICD-10-CM

## 2018-05-06 MED ORDER — FUROSEMIDE 20 MG PO TABS: 20.0000 mg | ORAL_TABLET | Freq: Every morning | ORAL | 1 refills | Status: DC

## 2018-05-06 NOTE — Therapy (Signed)
[facility] [address]  [place], [place], [address] Phone: [phone]   Fax:  [phone]  Physical Therapy Treatment  Patient Details  Name: [name] MRN: [id number] Date of Birth: 08-09-1940 Referring Provider (PT): Sarina Ill, MD   Encounter Date: 05/06/2018  PT End of Session - 05/06/18 1315    Visit Number  2    Number of Visits  8    Date for PT Re-Evaluation  05/27/18    Authorization Type  [employer] Medicare    PT Start Time  1315    PT Stop Time  1358    PT Time Calculation (min)  43 min    Activity Tolerance  Patient tolerated treatment well    Behavior During Therapy  [facility] for tasks assessed/performed       Past Medical History:  Diagnosis Date  . Arthritis    RIGHT KNEE PAIN AND OA  . Chest pain 05/03/2015  . Elevated cholesterol   . GERD (gastroesophageal reflux disease)   . Hypertension   . Hypothyroidism   . IBS (irritable bowel syndrome)   . Rotator cuff disorder    BOTH SHOULDERS - NO SURGERY - AND STATES LIMITATIONS IN ARM MOVEMENT  . Shortness of breath 05/03/2015  . Swelling    CHRONIC SWELLING RIGHT HAND AND BOTH FEET AND BOTH LEGS  . Vertigo     Past Surgical History:  Procedure Laterality Date  . ABDOMINAL HYSTERECTOMY  1985  . BREAST SURGERY  1985   RIGHT BREAST BIOPSY - BENIGN  . CHOLECYSTECTOMY  1975  . EYE SURGERY  1995   BILATERAL CATARACT EXTRACTIONS; SURGERY FOR MACULAR HOLE   . [place]  . 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 She also denies any issues or concerns with  HEP.    Patient Stated Goals  "To be able to roll over in bed and participate in water aerobics w/o dizziness"    Currently in Pain?  No/denies             Vestibular Assessment - 05/06/18 1315      Orthostatics   BP supine (x 5 minutes)  130/62    HR supine (x 5 minutes)  65    BP sitting  126/70    HR sitting  69    BP standing (after 1 minute)  128/70    HR standing (after 1 minute)  77    Orthostatics Comment  asymptomatic                Vestibular Treatment/Exercise - 05/06/18 1315      Vestibular Treatment/Exercise   Vestibular Treatment Provided  Gaze    Gaze Exercises  X1 Viewing Horizontal;X1 Viewing Vertical;X2 Viewing Horizontal;X2 Viewing Vertical;Eye/Head Exercise Horizontal      X1 Viewing Horizontal   Foot Position  standing - narrow BOS    Reps  1   60 sec   Comments  x10 with "A" on checkerboard background      X1 Viewing Vertical   Foot  Position  standing - narrow BOS    Reps  1   60 sec   Comments  x10 with "A" on checkerboard background      X2 Viewing Horizontal   Foot Position  seated    Reps  10   2 sets    Comments  PT moving target for 1 set, pt attempting movement for 2nd set      X2 Viewing Vertical   Foot Position  seated    Reps  10   2 sets   Comments  PT moving target for 1 set, pt attempting movement for 2nd set      Eye/Head Exercise Horizontal   Foot Position  standing    Reps  10    Comments  1/4 head turns, progressing to head, followed by body 1/4 turn, followed by head + body 1/4 turn x 10 each, initially with 5 sec pause, gradually decreasing to 2 sec pause                 PT Long Term Goals - 05/06/18 1331      PT LONG TERM GOAL #1   Title  Patient to be independent with advanced vestibular HEP    Status  On-going      PT LONG TERM GOAL #2   Title  Patient to report resolution of dizziness/lightheadedness symptoms with rolling in bed    Status  On-going      PT LONG TERM GOAL #3   Title   Patient will verbalize understanding of fall precautions to reduce rsik for fall in home    Status  On-going      PT LONG TERM GOAL #4   Title  Patient will report ability to participate in water aerobics w/o limitation due to dizziness or lightheadedness    Status  On-going            Plan - 05/06/18 1331    Clinical Impression Statement  Orthostatic BP checked w/o significant change in BP noted during testing. [name] reporting no issues with VOR x 1 and saccades HEP and able to demonstrate these appropriately on review. Progressed VOR x 1 exercises to standing with narrow BOS, then added 1/4 turns in standing initially with head first followed by body progressing to moving head/body together - all well tolerated until attempting to decrease hold/pause time between turns at which point pt noting the "lightheaded" feeling. Pt encouraged to attempt 1/4 turns in a corner at home working at a pace just shy of trigger of lightheaded feeling and gradually trying to decrease hold/pause times. Also introduced VOR x 2 but pt having significant difficulty coordinating head and target movements.    Rehab Potential  Good    PT Treatment/Interventions  Patient/family education;ADLs/Self Care Home Management;Canalith Repostioning;Vestibular;Manual techniques;Neuromuscular re-education;Balance training;Therapeutic activities;Functional mobility training;Visual/perceptual remediation/compensation    Consulted and Agree with Plan of Care  Patient       Patient will benefit from skilled therapeutic intervention in order to improve the following deficits and impairments:  Dizziness, Impaired vision/preception, Postural dysfunction, Decreased activity tolerance, Decreased balance  Visit Diagnosis: Dizziness and giddiness  Abnormal posture  Other abnormalities of gait and mobility     Problem List Patient Active Problem List   Diagnosis Date Noted  . Hypothyroidism 04/06/2018  . Hyperlipidemia  04/06/2018  . Essential hypertension 04/06/2018  . Abnormal glucose 04/06/2018  . Chest pain 05/03/2015  . Shortness of breath 05/03/2015  . Dizziness and giddiness 05/18/2014  .  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 Vertigo 01/06/2014  . Postoperative anemia due to acute blood loss 12/30/2012  . OA (osteoarthritis) of knee 12/29/2012    Percival Spanish, PT, MPT  05/06/2018, 2:12 PM  [facility] [address]  [place], [place], [address] Phone: [phone]   Fax:  [phone]  Name: [name] MRN: [id number] Date of Birth: 1941-06-16

## 2018-05-07 LAB — LIPID PANEL W/O CHOL/HDL RATIO
CHOLESTEROL TOTAL: 150 mg/dL (ref 100–199)
HDL: 56 mg/dL (ref >39)
LDL CALC: 78 mg/dL (ref 0–99)
TRIGLYCERIDES: 79 mg/dL (ref 0–149)
VLDL Cholesterol Cal: 16 mg/dL (ref 5–40)

## 2018-05-07 LAB — SPECIMEN STATUS REPORT

## 2018-05-08 ENCOUNTER — Ambulatory Visit: Payer: Medicare Other | Admitting: Physical Therapy

## 2018-05-08 DIAGNOSIS — R42 Dizziness and giddiness: Secondary | ICD-10-CM

## 2018-05-08 DIAGNOSIS — R293 Abnormal posture: Secondary | ICD-10-CM

## 2018-05-08 DIAGNOSIS — R2689 Other abnormalities of gait and mobility: Secondary | ICD-10-CM

## 2018-05-08 NOTE — Therapy (Addendum)
[facility] [address]  [place], [place], [address] Phone: [phone]   Fax:  [phone]  Physical Therapy Treatment / Discharge Summary  Patient Details  Name: [name] MRN: [id number] Date of Birth: 05-23-1941 Referring Provider (PT): Sarina Ill, MD   Encounter Date: 05/08/2018  PT End of Session - 05/08/18 1101    Visit Number  3    Number of Visits  8    Date for PT Re-Evaluation  05/27/18    Authorization Type  [employer] Medicare    PT Start Time  1101    PT Stop Time  1145    PT Time Calculation (min)  44 min    Activity Tolerance  Patient tolerated treatment well    Behavior During Therapy  [facility] for tasks assessed/performed       Past Medical History:  Diagnosis Date  . Arthritis    RIGHT KNEE PAIN AND OA  . Chest pain 05/03/2015  . Elevated cholesterol   . GERD (gastroesophageal reflux disease)   . Hypertension   . Hypothyroidism   . IBS (irritable bowel syndrome)   . Rotator cuff disorder    BOTH SHOULDERS - NO SURGERY - AND STATES LIMITATIONS IN ARM MOVEMENT  . Shortness of breath 05/03/2015  . Swelling    CHRONIC SWELLING RIGHT HAND AND BOTH FEET AND BOTH LEGS  . Vertigo     Past Surgical History:  Procedure Laterality Date  . ABDOMINAL HYSTERECTOMY  1985  . BREAST SURGERY  1985   RIGHT BREAST BIOPSY - BENIGN  . CHOLECYSTECTOMY  1975  . EYE SURGERY  1995   BILATERAL CATARACT EXTRACTIONS; SURGERY FOR MACULAR HOLE   . [place]  . 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  Subjective Assessment - 05/08/18 1103    Subjective  Pt reports she had trouble completing the updated versions of the VOR exercises w/o new pictures, so she just worked in the initial HEP.    Patient Stated Goals   "To be able to roll over in bed and participate in water aerobics w/o dizziness"                       [facility] Adult PT Treatment/Exercise - 05/08/18 1101      High Level Balance   High Level Balance Activities  Head turns    High Level Balance Comments  walking with visual scanning for playing cards at ~eye level      Vestibular Treatment/Exercise - 05/08/18 1101      Vestibular Treatment/Exercise   Vestibular Treatment Provided  Gaze;Habituation    Habituation Exercises  Standing Horizontal Head Turns    Gaze Exercises  X2 Viewing Horizontal;X2 Viewing Vertical;Eye/Head Exercise Horizontal;Eye/Head Exercise Vertical;X1 Viewing Horizontal;X1 Viewing Vertical      Standing Horizontal Head Turns   Number of Reps   10   per set   Symptom Description   head + body 1/4 turn x 10 each, initially with 5 sec pause, gradually decreasing to 2 sec pause before onset of dizziness/lightheaded feeling      X1 Viewing Horizontal   Foot Position  standing - narrow BOS on Airex pad    Reps  5    Comments  performed  2 sets - eyes open & closed - mild dizziness & LOB with eyes closed      X1 Viewing Vertical   Foot Position  standing - narrow BOS on Airex pad    Reps  5    Comments  performed 2 sets - eyes open & closed - mild dizziness & LOB with eyes closed      X2 Viewing Horizontal   Foot Position  seated    Time  --   30 sec   Reps  2     Comments  pt better able to coordinate movement today      X2 Viewing Vertical   Foot Position  seated    Time  --   30 sec   Reps  2    Comments  initially still struggling to coordinate movement but improved with increased time      Eye/Head Exercise Horizontal   Foot Position  standing    Reps  10    Comments  stationary ball toss L<>R; walking horizontal ball toss x 30 ft      Eye/Head Exercise Vertical   Foot Position  standing    Reps  10    Comments  stationary ball toss up<>down; walking vertical ball toss x 30 ft                  PT Long Term Goals - 05/06/18 1331      PT LONG TERM GOAL #1   Title  Patient to be independent with advanced vestibular HEP    Status  On-going      PT LONG TERM GOAL #2   Title  Patient to report resolution of dizziness/lightheadedness symptoms with rolling in bed    Status  On-going      PT LONG TERM GOAL #3   Title  Patient will verbalize understanding of fall precautions to reduce rsik for fall in home    Status  On-going      PT LONG TERM GOAL #4   Title  Patient will report ability to participate in water aerobics w/o limitation due to dizziness or lightheadedness    Status  On-going            Plan - 05/08/18 1145    Clinical Impression Statement  Quisha reporting poor recall of recommended HEP progression from last visit, therefore only completing initial HEP at home. Reviewed 1/4 turns for HEP and progressed vestubular training as tolerated, also adding seated VOR x 2 and standing ball tosses to HEP. Pt experiencing intermittent dizziness/lightheadedness with higher level activities today which resolved quickly with rest breaks.    Rehab Potential  Good    PT Treatment/Interventions  Patient/family education;ADLs/Self Care Home Management;Canalith Repostioning;Vestibular;Manual techniques;Neuromuscular re-education;Balance training;Therapeutic activities;Functional mobility training;Visual/perceptual remediation/compensation    Consulted and Agree with Plan of Care  Patient       Patient will benefit from skilled therapeutic intervention in order to improve the following deficits and impairments:  Dizziness, Impaired vision/preception, Postural dysfunction, Decreased activity tolerance, Decreased balance  Visit Diagnosis: Dizziness and giddiness  Abnormal posture  Other abnormalities of gait and mobility     Problem List Patient Active Problem List   Diagnosis Date Noted  . Hypothyroidism 04/06/2018  . Hyperlipidemia 04/06/2018  .  Essential hypertension 04/06/2018  . Abnormal glucose 04/06/2018  . Chest pain 05/03/2015  . Shortness of breath 05/03/2015  . Dizziness and giddiness 05/18/2014  . 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 Vertigo 01/06/2014  .  Postoperative anemia due to acute blood loss 12/30/2012  . OA (osteoarthritis) of knee 12/29/2012    Percival Spanish, PT, MPT 05/08/2018, 12:07 PM  [facility] [address]  [place], [place], [address] Phone: [phone]   Fax:  [phone]  Name: IYANNA DRUMMER MRN: [id number] Date of Birth: [birth year]  PHYSICAL THERAPY DISCHARGE SUMMARY  Visits from Start of Care: 3  Current functional level related to goals / functional outcomes:   Refer to above clinical impression for status as of last visit on 05/08/18. Pt has not returned in > 30 days, therefore will proceed with discharge from PT for this episode but unable to formally assess status at discharge due to failure to return for further visits as planned.   Remaining deficits:   As above.   Education / Equipment:   HEP  Plan: Patient agrees to discharge.  Patient goals were not met. Patient is being discharged due to not returning since the last visit.  ?????    Percival Spanish, PT, MPT 06/17/18, 10:09 AM  [facility] [address]  [place], [place], [address] Phone: [phone]   Fax:  [phone]

## 2018-06-16 ENCOUNTER — Telehealth: Payer: Self-pay

## 2018-06-16 NOTE — Telephone Encounter (Signed)
-----   Message from Glendale Chard, MD sent at 06/13/2018  5:17 PM EST ----- Does she want to take pepcid in its place? She can try over the counter. If she tolerates it okay, I can send in a prescription.  ----- Message ----- From: Michelle Nasuti, CMA Sent: 06/13/2018   4:30 PM EST To: Glendale Chard, MD  The pt called and said she needed a medication to replace her ranitidine because it has been recalled.

## 2018-06-16 NOTE — Telephone Encounter (Signed)
Patient notified

## 2018-06-26 ENCOUNTER — Encounter: Payer: Self-pay | Admitting: Neurology

## 2018-06-26 ENCOUNTER — Ambulatory Visit (INDEPENDENT_AMBULATORY_CARE_PROVIDER_SITE_OTHER): Payer: Medicare Other | Admitting: Neurology

## 2018-06-26 VITALS — BP 140/68 | HR 70 | Ht 61.0 in | Wt 228.0 lb

## 2018-06-26 DIAGNOSIS — R0683 Snoring: Secondary | ICD-10-CM | POA: Diagnosis not present

## 2018-06-26 DIAGNOSIS — Z6841 Body Mass Index (BMI) 40.0 and over, adult: Secondary | ICD-10-CM

## 2018-06-26 DIAGNOSIS — G4719 Other hypersomnia: Secondary | ICD-10-CM | POA: Diagnosis not present

## 2018-06-26 DIAGNOSIS — R351 Nocturia: Secondary | ICD-10-CM | POA: Diagnosis not present

## 2018-06-26 DIAGNOSIS — R600 Localized edema: Secondary | ICD-10-CM

## 2018-06-26 NOTE — Patient Instructions (Signed)

## 2018-06-26 NOTE — Progress Notes (Signed)
Subjective:    Patient ID: [name] is a 77 y.o. female.  HPI     [name], MD, [name], [name],   [name], [name], upon your kind request in my sleep clinic today for initial consultation of her sleep disorder, in particular, concern for underlying obstructive sleep apnea. The patient is unaccompanied today. As you know, [name] is a 77 year old right-handed woman with an underlying medical history of hypertension, hyperlipidemia, irritable bowel syndrome, vertigo, arthritis in both knees, with s/p R TKA, hypothyroidism, edema of both, reflux disease, history of chest pain and morbid obesity with BMI of over 40, who reports snoring and excessive daytime somnolence. I reviewed your office note from 05/01/2018. Her Epworth sleepiness score is 5 out of 24 today, fatigue score is 21 out of 63. She is widowed and lives alone, she has 79 children. She does not smoke, drinks alcohol occasionally, 3-4 times per month on average and drinks caffeine in the form of hot tea sometimes. She used to work for the [employer] and also has worked in the [employer] before. Bedtime is generally around 10 and rise time around 5:40, as she goes to the [facility] for water aerobics several days of the week. She has nocturia on average once in the early morning hours, denies morning headaches or recurrent headaches. She quit smoking over 20 years ago. All 4 children live in [place]. She has had lower extremity swelling which is much worse on the left and she tries to elevate her legs. When she was still working she would use compression stockings. She is a side sleeper. She has no pets and does not typically have the TV on at night in her bedroom. She denies telltale symptoms of restless leg syndrome. She has left knee discomfort and is getting injections into it, sees Dr.  Maureen Ralphs.  Her Past Medical History Is Significant For: Past Medical History:  Diagnosis Date  . Arthritis    RIGHT KNEE PAIN AND OA  . Chest pain 05/03/2015  . Elevated cholesterol   . GERD (gastroesophageal reflux disease)   . Hypertension   . Hypothyroidism   . IBS (irritable bowel syndrome)   . Rotator cuff disorder    BOTH SHOULDERS - NO SURGERY - AND STATES LIMITATIONS IN ARM MOVEMENT  . Shortness of breath 05/03/2015  . Swelling    CHRONIC SWELLING RIGHT HAND AND BOTH FEET AND BOTH LEGS  . Vertigo     Her Past Surgical History Is Significant For: Past Surgical History:  Procedure Laterality Date  . ABDOMINAL HYSTERECTOMY  1985  . BREAST SURGERY  1985   RIGHT BREAST BIOPSY - BENIGN  . CHOLECYSTECTOMY  1975  . EYE SURGERY  1995   BILATERAL CATARACT EXTRACTIONS; SURGERY FOR MACULAR HOLE   . [place]  . 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   Procedure: RIGHT TOTAL KNEE ARTHROPLASTY;  Surgeon: Gearlean Alf, MD;  Location: [facility];  Service: Orthopedics;  Laterality: Right;    Her Family History Is Significant For: Family History  Problem Relation Age of Onset  . Diabetes Mother   . Dementia Mother   . Diabetes Father   . Diabetes Sister   . Breast cancer Sister   . Kidney cancer Sister   . Lung cancer Sister   . Lung cancer Maternal Aunt   .  Pancreatic cancer Maternal Aunt     Her Social History Is Significant For: Social History   Socioeconomic History  . Marital status: Widowed    Spouse name: nathaniel  . Number of children: 4  . Years of education: 37  . Highest education level: Not on file  Occupational History  . Occupation: Retired  Scientific laboratory technician  . Financial resource strain: Not hard at all  . Food insecurity:    Worry: Never true    Inability: Never true  . Transportation needs:    Medical: No    Non-medical: No  Tobacco Use  . Smoking status: Never Smoker  . Smokeless tobacco: Never Used  Substance and Sexual  Activity  . Alcohol use: Yes    Comment: OCCAS ALCOHOL  . Drug use: No  . Sexual activity: Not Currently  Lifestyle  . Physical activity:    Days per week: 4 days    Minutes per session: 60 min  . Stress: Not at all  Relationships  . Social connections:    Talks on phone: Not on file    Gets together: Not on file    Attends religious service: Not on file    Active member of club or organization: Not on file    Attends meetings of clubs or organizations: Not on file    Relationship status: Not on file  Other Topics Concern  . Not on file  Social History Narrative   [name]   Patient is retired.    Patient has 4 children.    Patient has a high school education.    Patient is widowed   Right handed      Epworth Sleepiness Scale = 79 (as of 05/03/2015)    Her Allergies Are:  Allergies  Allergen Reactions  . Codeine Nausea And Vomiting  . Macrobid [Nitrofurantoin Macrocrystal]     RASH  . Tramadol Nausea And Vomiting  :   Her Current Medications Are:  Outpatient Encounter Medications as of 06/26/2018  Medication Sig  . Aspirin (ECOTRIN PO) Take 80 mg by mouth daily.  Marland Kitchen BENICAR 20 MG tablet Take 1 tablet (20 mg total) by mouth daily.  . bimatoprost (LUMIGAN) 0.01 % SOLN Place 1 drop into both eyes at bedtime.   . CRESTOR 10 MG tablet Take 1 tablet (10 mg total) by mouth daily.  . Famotidine (PEPCID PO) Take by mouth.  . furosemide (LASIX) 20 MG tablet Take 1 tablet (20 mg total) by mouth every morning.  Marland Kitchen levothyroxine (SYNTHROID, LEVOTHROID) 75 MCG tablet Take 75 mcg by mouth daily before breakfast.   . Multiple Vitamins-Minerals (CENTRUM SILVER PO) Take 1 tablet by mouth daily.   . Multiple Vitamins-Minerals (EMERGEN-C VITAMIN C PO) Take 1 Package by mouth daily.   Marland Kitchen olopatadine (PATANOL) 0.1 % ophthalmic solution Place 1 drop into both eyes daily.   . polyethylene glycol (MIRALAX / GLYCOLAX) packet Take 17 g by mouth daily.  . Polyvinyl  Alcohol-Povidone (REFRESH OP) Place 1 drop into both eyes daily.   . vitamin B-6 (PYRIDOXINE) 25 MG tablet Take 25 mg by mouth daily.  . [DISCONTINUED] ranitidine (ZANTAC) 150 MG tablet TAKE 1 TABLET BY MOUTH  EVERY EVENING   No facility-administered encounter medications on file as of 06/26/2018.   :  Review of Systems:  Out of a complete 14 point review of systems, all are reviewed and negative with the exception of these symptoms as listed below: Review of Systems  Neurological:  Pt presents today to discuss her sleep. Pt has never had a sleep study but does endorse snoring.  Epworth Sleepiness Scale 0= would never doze 1= slight chance of dozing 2= moderate chance of dozing 3= high chance of dozing  Sitting and reading: 1 Watching TV: 1 Sitting inactive in a public place (ex. Theater or meeting): 0 As a passenger in a car for an hour without a break: 0 Lying down to rest in the afternoon: 2 Sitting and talking to someone: 0 Sitting quietly after lunch (no alcohol): 1 In a car, while stopped in traffic: 0 Total: 5     Objective:  Neurological Exam  Physical Exam Physical Examination:   Vitals:   06/26/18 1259  BP: 140/68  Pulse: 70    General Examination: The patient is a very pleasant 77 y.o. female in no acute distress. She appears well-developed and well-nourished and well groomed.   HEENT: Normocephalic, atraumatic, pupils are equal, round and reactive to light and accommodation. Extraocular tracking is good without limitation to gaze excursion or nystagmus noted. Normal smooth pursuit is noted. Hearing is grossly intact. Face is symmetric with normal facial animation and normal facial sensation. Speech is clear with no dysarthria noted. There is no hypophonia. There is no lip, neck/head, jaw or voice tremor. Neck is supple with full range of passive and active motion. There are no carotid bruits on auscultation. Oropharynx exam reveals: mild mouth dryness,  adequate dental hygiene and mild airway crowding, due to redundant soft palate, tonsils are absent. Mallampati is class II. She has a slightly wider uvula. Tonsils are absent. Mallampati is class II. Tongue protrudes centrally and palate elevates symmetrically. Neck circumference is 16-1/2 inches.  Chest: Clear to auscultation without wheezing, rhonchi or crackles noted.  Heart: S1+S2+0, regular and normal without murmurs, rubs or gallops noted.   Abdomen: Soft, non-tender and non-distended with normal bowel sounds appreciated on auscultation.  Extremities: There is 1+ pitting edema in the right distal lower extremity, 2+ edema in the left distal lower extremity. She has an unremarkable scar from right total knee replacement.  Skin: Warm and dry without trophic changes noted.  Musculoskeletal: exam reveals no obvious joint deformities, tenderness or joint swelling or erythema.   Neurologically:  Mental status: The patient is awake, alert and oriented in all 4 spheres. Her immediate and remote memory, attention, language skills and fund of knowledge are appropriate. There is no evidence of aphasia, agnosia, apraxia or anomia. Speech is clear with normal prosody and enunciation. Thought process is linear. Mood is normal and affect is normal.  Cranial nerves II - XII are as described above under HEENT exam. In addition: shoulder shrug is normal with equal shoulder height noted. Motor exam: Normal bulk, strength and tone is noted. There is no drift, tremor or rebound. Romberg is not tested for safety reasons. Fine motor skills and coordination: intact with normal finger taps, normal hand movements, normal rapid alternating patting, normal foot taps and normal foot agility.  Cerebellar testing: No dysmetria or intention tremor on finger to nose testing. Heel to shin is unremarkable bilaterally. There is no truncal or gait ataxia.  Sensory exam: intact to light touch.  Gait, station and balance: She  stands easily. No veering to one side is noted. No leaning to one side is noted. Posture is age-appropriate and stance is narrow based. Gait is slightly slow and cautious.  Assessment and Plan:  In summary, [name] is a very pleasant 29 y.o.-year  old female with an underlying medical history of hypertension, hyperlipidemia, irritable bowel syndrome, vertigo, arthritis in both knees, with s/p R TKA, hypothyroidism, edema of both, reflux disease, history of chest pain and morbid obesity with BMI of over 40, whose history and physical exam concerning for obstructive sleep apnea (OSA). I had a long chat with the patient about my findings and the diagnosis of OSA, its prognosis and treatment options. We talked about medical treatments, surgical interventions and non-pharmacological approaches. I explained in particular the risks and ramifications of untreated moderate to severe OSA, especially with respect to developing cardiovascular disease down the Road, including congestive heart failure, difficult to treat hypertension, cardiac arrhythmias, or stroke. Even type 2 diabetes has, in part, been linked to untreated OSA. Symptoms of untreated OSA include daytime sleepiness, memory problems, mood irritability and mood disorder such as depression and anxiety, lack of energy, as well as recurrent headaches, especially morning headaches. We talked about trying to maintain a healthy lifestyle in general, as well as the importance of weight control. I encouraged the patient to eat healthy, exercise daily and keep well hydrated, to keep a scheduled bedtime and wake time routine, to not skip any meals and eat healthy snacks in between meals. I advised the patient not to drive when feeling sleepy. I recommended the following at this time: sleep study with potential positive airway pressure titration. (We will score hypopneas at 4%).   I explained the sleep test procedure to the patient and also outlined possible  surgical and non-surgical treatment options of OSA, including the use of a custom-made dental device (which would require a referral to a specialist dentist or oral surgeon), upper airway surgical options, such as pillar implants, radiofrequency surgery, tongue base surgery, and UPPP (which would involve a referral to an ENT surgeon). Rarely, jaw surgery such as mandibular advancement may be considered.  I also explained the CPAP treatment option to the patient, who indicated that she would be willing to try CPAP if the need arises. I explained the importance of being compliant with PAP treatment, not only for insurance purposes but primarily to improve Her symptoms, and for the patient's long term health benefit, including to reduce Her cardiovascular risks. I answered all her questions today and the patient was in agreement. I would like to see her back after the sleep study is completed and encouraged her to call with any interim questions, concerns, problems or updates.   Thank you very much for allowing me to participate in the care of this nice patient. If I can be of any further assistance to you please do not hesitate to call me at [phone].  Sincerely,   [name], MD, PhD

## 2018-07-17 ENCOUNTER — Telehealth: Payer: Self-pay

## 2018-07-17 NOTE — Telephone Encounter (Signed)
The pt wanted to let Dr. Baird Cancer know that the Pepcid o-t-c med wasn't helping and the pt was told that Dr. Baird Cancer wanted to know if the pt is taking the med 2 times per day and the pt said no and that she will see if 2 times per day will help.

## 2018-08-22 ENCOUNTER — Ambulatory Visit (INDEPENDENT_AMBULATORY_CARE_PROVIDER_SITE_OTHER): Payer: Medicare Other | Admitting: Neurology

## 2018-08-22 ENCOUNTER — Other Ambulatory Visit: Payer: Self-pay | Admitting: Internal Medicine

## 2018-08-22 DIAGNOSIS — G4720 Circadian rhythm sleep disorder, unspecified type: Secondary | ICD-10-CM

## 2018-08-22 DIAGNOSIS — Z6841 Body Mass Index (BMI) 40.0 and over, adult: Secondary | ICD-10-CM

## 2018-08-22 DIAGNOSIS — R600 Localized edema: Secondary | ICD-10-CM

## 2018-08-22 DIAGNOSIS — G4719 Other hypersomnia: Secondary | ICD-10-CM

## 2018-08-22 DIAGNOSIS — R351 Nocturia: Secondary | ICD-10-CM

## 2018-08-22 DIAGNOSIS — R0683 Snoring: Secondary | ICD-10-CM

## 2018-08-22 DIAGNOSIS — G4710 Hypersomnia, unspecified: Secondary | ICD-10-CM

## 2018-08-22 MED ORDER — FUROSEMIDE 20 MG PO TABS: 20.0000 mg | ORAL_TABLET | Freq: Every morning | ORAL | 1 refills | Status: DC

## 2018-08-22 MED ORDER — LEVOTHYROXINE SODIUM 75 MCG PO TABS: 75.0000 ug | ORAL_TABLET | Freq: Every day | ORAL | 1 refills | Status: DC

## 2018-08-25 ENCOUNTER — Telehealth: Payer: Self-pay

## 2018-08-25 NOTE — Telephone Encounter (Signed)
I called pt to discuss her sleep study results. No answer, left a message asking her to call me back. 

## 2018-08-25 NOTE — Progress Notes (Signed)
Patient referred by Dr. Baird Cancer, seen by me on 06/26/18, diagnostic PSG on 08/22/18.   Please call and notify the patient that the recent sleep study did not show any significant obstructive sleep apnea with the exception of mild and intermittent snoring and REM/dream sleep related OSA, for which CPAP therapy is not warranted, but I would recommend she try to lose weight and try to sleep on her sides rather than her back. She mostly slept on the L side during the study. Unfortunately, she did not sleep very well, achieved mostly light stage sleep with interruptions. She can FU with Dr. Baird Cancer at this point.   Thanks,  [name], MD, PhD [facility])

## 2018-08-25 NOTE — Telephone Encounter (Signed)
Pt returned my call. I discussed her sleep study results with her. Pt is agreeable to following up with Dr. Baird Cancer. Pt verbalized understanding of results. Pt had no questions at this time but was encouraged to call back if questions arise.

## 2018-08-25 NOTE — Procedures (Signed)
PATIENT'S NAME:  [name], [name] DOB:      1940/10/18      MR#:    [id number]     DATE OF RECORDING: 08/22/2018 REFERRING M.D.:  Glendale Chard MD Study Performed:   Baseline Polysomnogram HISTORY: 78 year old woman with an underlying medical history of hypertension, hyperlipidemia, irritable bowel syndrome, vertigo, arthritis in both knees, with s/p R TKA, hypothyroidism, edema of both, reflux disease, history of chest pain and morbid obesity with BMI of over 40, who reports snoring and excessive daytime somnolence. The patient endorsed the Epworth Sleepiness Scale at 5 points. The patient's weight 228 pounds with a height of 61 (inches), resulting in a BMI of 42.9 kg/m2. The patient's neck circumference measured 16.5 inches.  CURRENT MEDICATIONS: ASA, Benicar, Lumigan, Crestor, Pepcid, Lasix, Synthroid, Centrum silver, Patanol, Mirilax, Vit B6   PROCEDURE:  This is a multichannel digital polysomnogram utilizing the Somnostar 11.2 system.  Electrodes and sensors were applied and monitored per AASM Specifications.   EEG, EOG, Chin and Limb EMG, were sampled at 200 Hz.  ECG, Snore and Nasal Pressure, Thermal Airflow, Respiratory Effort, CPAP Flow and Pressure, Oximetry was sampled at 50 Hz. Digital video and audio were recorded.      BASELINE STUDY  Lights Out was at 21:06 and Lights On at 05:00.  Total recording time (TRT) was 474 minutes, with a total sleep time (TST) of 258.5 minutes.   The patient's sleep latency was 47 minutes, which is delayed. REM latency was 114.5 minutes. The sleep efficiency was 54.5%, which is markedly reduced.     SLEEP ARCHITECTURE: WASO (Wake after sleep onset) was 174.5 minutes with moderate sleep fragmentation noted and one longer period of wakefulness. There were 40 minutes in Stage N1, 177 minutes Stage N2, 1 minutes Stage N3 and 40.5 minutes in Stage REM.  The percentage of Stage N1 was 15.5%, which is increased, Stage N2 was 68.5%, which is increased, Stage N3 was  near-absent and Stage R (REM sleep) was 15.7%, which is mildly reduced. The arousals were noted as: 22 were spontaneous, 0 were associated with PLMs, 1 were associated with respiratory events.  RESPIRATORY ANALYSIS:  There were a total of 20 respiratory events:  0 obstructive apneas, 0 central apneas and 0 mixed apneas with a total of 0 apneas and an apnea index (AI) of 0 /hour. There were 20 hypopneas with a hypopnea index of 4.6 /hour. The patient also had 0 respiratory event related arousals (RERAs).      The total APNEA/HYPOPNEA INDEX (AHI) was 4.6 /hour and the total RESPIRATORY DISTURBANCE INDEX was 0. 4.6 /hour.  20 events occurred in REM sleep and 0 events in NREM. The REM AHI was 29.6 /hour, versus a non-REM AHI of 0. The patient spent 47 minutes of total sleep time in the supine position and 212 minutes in non-supine.. The supine AHI was 0.0 versus a non-supine AHI of 5.7.  OXYGEN SATURATION & C02:  The Wake baseline 02 saturation was 96%, with the lowest being 87%. Time spent below 89% saturation equaled 4 minutes.  PERIODIC LIMB MOVEMENTS: The patient had a total of 0 Periodic Limb Movements.  The Periodic Limb Movement (PLM) index was 0 and the PLM Arousal index was 0/hour.  Audio and video analysis did not show any abnormal or unusual movements, behaviors, phonations or vocalizations. The patient took one bathroom break. Mild intermittent snoring was noted. The EKG was in keeping with normal sinus rhythm (NSR).  Post-study, the patient indicated that  sleep was worse than usual.   IMPRESSION:  1. Primary Snoring 2. Dysfunctions associated with sleep stages or arousal from sleep  RECOMMENDATIONS:  1. This study does not demonstrate any significant obstructive or central sleep disordered breathing with a total AHI of below 5/hour. 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 Treatment with CPAP is not warranted; weight loss and avoiding the supine  sleep position will likely aid in reducing the snoring and REM related OSA.  2. This study shows sleep fragmentation and abnormal sleep stage percentages; these are nonspecific findings and per se do not signify an intrinsic sleep disorder or a cause for the patient's sleep-related symptoms. Causes include (but are not limited to) the first night effect of the sleep study, circadian rhythm disturbances, medication effect or an underlying mood disorder or medical problem.  3. The patient should be cautioned not to drive, work at heights, or operate dangerous or heavy equipment when tired or sleepy. Review and reiteration of good sleep hygiene measures should be pursued with any patient. 4. The patient will be advised to follow up with the referring provider, who will be notified of the test results.  I certify that I have reviewed the entire raw data recording prior to the issuance of this report in accordance with the Standards of Accreditation of the American Academy of Sleep Medicine (AASM)  [name], MD, PhD Diplomat, American Board of Neurology and Sleep Medicine (Neurology and Sleep Medicine)

## 2018-08-25 NOTE — Telephone Encounter (Signed)
-----   Message from [name], MD sent at 08/25/2018  7:51 AM EST ----- Patient referred by Dr. Baird Cancer, seen by me on 06/26/18, diagnostic PSG on 08/22/18.   Please call and notify the patient that the recent sleep study did not show any significant obstructive sleep apnea with the exception of mild and intermittent snoring and REM/dream sleep related OSA, for which CPAP therapy is not warranted, but I would recommend she try to lose weight and try to sleep on her sides rather than her back. She mostly slept on the L side during the study. Unfortunately, she did not sleep very well, achieved mostly light stage sleep with interruptions. She can FU with Dr. Baird Cancer at this point.   Thanks,  [name], MD, PhD [facility])

## 2018-08-27 LAB — HM COLONOSCOPY

## 2018-09-03 ENCOUNTER — Other Ambulatory Visit: Payer: Self-pay | Admitting: Internal Medicine

## 2018-09-16 ENCOUNTER — Encounter: Payer: Self-pay | Admitting: Internal Medicine

## 2018-10-10 ENCOUNTER — Encounter: Payer: Self-pay | Admitting: Internal Medicine

## 2018-10-30 ENCOUNTER — Telehealth: Payer: Self-pay

## 2018-10-30 NOTE — Telephone Encounter (Signed)
The pt called and confirmed that she will be keeping her appointment next week.

## 2018-11-03 ENCOUNTER — Ambulatory Visit: Payer: Medicare Other | Admitting: Internal Medicine

## 2018-11-04 ENCOUNTER — Other Ambulatory Visit: Payer: Self-pay

## 2018-11-04 ENCOUNTER — Ambulatory Visit (INDEPENDENT_AMBULATORY_CARE_PROVIDER_SITE_OTHER): Payer: Medicare Other | Admitting: Internal Medicine

## 2018-11-04 ENCOUNTER — Other Ambulatory Visit: Payer: Self-pay | Admitting: Internal Medicine

## 2018-11-04 ENCOUNTER — Encounter: Payer: Self-pay | Admitting: Internal Medicine

## 2018-11-04 VITALS — BP 136/78 | HR 72 | Temp 97.8°F | Ht 61.0 in | Wt 224.2 lb

## 2018-11-04 DIAGNOSIS — E039 Hypothyroidism, unspecified: Secondary | ICD-10-CM

## 2018-11-04 DIAGNOSIS — I10 Essential (primary) hypertension: Secondary | ICD-10-CM

## 2018-11-04 DIAGNOSIS — E7800 Pure hypercholesterolemia, unspecified: Secondary | ICD-10-CM | POA: Diagnosis not present

## 2018-11-04 DIAGNOSIS — R7309 Other abnormal glucose: Secondary | ICD-10-CM

## 2018-11-04 DIAGNOSIS — Z79899 Other long term (current) drug therapy: Secondary | ICD-10-CM

## 2018-11-04 DIAGNOSIS — R0609 Other forms of dyspnea: Secondary | ICD-10-CM

## 2018-11-04 DIAGNOSIS — Z6841 Body Mass Index (BMI) 40.0 and over, adult: Secondary | ICD-10-CM

## 2018-11-04 MED ORDER — MAGNESIUM 400 MG PO CAPS: 400.0000 mg | ORAL_CAPSULE | Freq: Every day | ORAL | 1 refills | Status: DC

## 2018-11-04 NOTE — Progress Notes (Signed)
Subjective:     Patient ID: [name] , female    DOB: [birth year] , 78 y.o.   MRN: [id number]   Chief Complaint  Patient presents with  . Hypertension  . Hyperlipidemia    HPI  Hypertension  This is a chronic problem. The current episode started more than 1 year ago. The problem has been gradually improving since onset. The problem is controlled. Associated symptoms include shortness of breath (she c/o DOE. Not SOB at rest. sx started about 3 weeks ago. unable to go to water aerobics due to COVID-19. no cp. no palpitations. doesn't usually walk for exercise, prefers aqua aerobics due to OA). Pertinent negatives include no blurred vision, chest pain or palpitations. Risk factors for coronary artery disease include dyslipidemia, obesity, post-menopausal state and sedentary lifestyle. The current treatment provides moderate improvement. Compliance problems include exercise.   Hyperlipidemia  This is a chronic problem. The current episode started more than 1 year ago. The problem is controlled. Exacerbating diseases include hypothyroidism and obesity. Associated symptoms include shortness of breath (she c/o DOE. Not SOB at rest. sx started about 3 weeks ago. unable to go to water aerobics due to COVID-19. no cp. no palpitations. doesn't usually walk for exercise, prefers aqua aerobics due to OA). Pertinent negatives include no chest pain.     Past Medical History:  Diagnosis Date  . Arthritis    RIGHT KNEE PAIN AND OA  . Chest pain 05/03/2015  . Elevated cholesterol   . GERD (gastroesophageal reflux disease)   . Hypertension   . Hypothyroidism   . IBS (irritable bowel syndrome)   . Rotator cuff disorder    BOTH SHOULDERS - NO SURGERY - AND STATES LIMITATIONS IN ARM MOVEMENT  . Shortness of breath 05/03/2015  . Swelling    CHRONIC SWELLING RIGHT HAND AND BOTH FEET AND BOTH LEGS  . Vertigo      Family History  Problem Relation Age of Onset  . Diabetes Mother   . Dementia Mother    . Diabetes Father   . Diabetes Sister   . Breast cancer Sister   . Kidney cancer Sister   . Lung cancer Sister   . Lung cancer Maternal Aunt   . Pancreatic cancer Maternal Aunt      Current Outpatient Medications:  .  Aspirin (ECOTRIN PO), Take 80 mg by mouth daily., Disp: , Rfl:  .  BENICAR 20 MG tablet, Take 1 tablet (20 mg total) by mouth daily., Disp: 90 tablet, Rfl: 2 .  bimatoprost (LUMIGAN) 0.01 % SOLN, Place 1 drop into both eyes at bedtime. , Disp: , Rfl:  .  CRESTOR 10 MG tablet, Take 1 tablet (10 mg total) by mouth daily., Disp: 90 tablet, Rfl: 0 .  furosemide (LASIX) 20 MG tablet, Take 1 tablet (20 mg total) by mouth every morning., Disp: 90 tablet, Rfl: 1 .  levothyroxine (SYNTHROID, LEVOTHROID) 75 MCG tablet, Take 1 tablet (75 mcg total) by mouth daily before breakfast. DAW1, please fill Brand name Synthroid (Patient taking differently: Take 75 mcg by mouth daily before breakfast. DAW1, please fill Brand name Synthroid Take 1 tablet by mouth Monday - Saturday), Disp: 90 tablet, Rfl: 1 .  Multiple Vitamins-Minerals (CENTRUM SILVER PO), Take 1 tablet by mouth daily. , Disp: , Rfl:  .  Multiple Vitamins-Minerals (EMERGEN-C VITAMIN C PO), Take 1 Package by mouth daily. , Disp: , Rfl:  .  olopatadine (PATANOL) 0.1 % ophthalmic solution, Place 1 drop into both eyes  daily. , Disp: , Rfl:  .  omeprazole (PRILOSEC) 20 MG capsule, Take 20 mg by mouth daily., Disp: , Rfl:  .  polyethylene glycol (MIRALAX / GLYCOLAX) packet, Take 17 g by mouth daily., Disp: , Rfl:  .  Polyvinyl Alcohol-Povidone (REFRESH OP), Place 1 drop into both eyes daily. , Disp: , Rfl:  .  vitamin B-6 (PYRIDOXINE) 25 MG tablet, Take 25 mg by mouth daily., Disp: , Rfl:  .  Magnesium 400 MG CAPS, Take 400 mg by mouth daily after supper., Disp: 90 capsule, Rfl: 1   Allergies  Allergen Reactions  . Codeine Nausea And Vomiting  . Macrobid [Nitrofurantoin Macrocrystal]     RASH  . Tramadol Nausea And Vomiting      Review of Systems  Constitutional: Negative.   Eyes: Negative for blurred vision.  Respiratory: Positive for shortness of breath (she c/o DOE. Not SOB at rest. sx started about 3 weeks ago. unable to go to water aerobics due to COVID-19. no cp. no palpitations. doesn't usually walk for exercise, prefers aqua aerobics due to OA).   Cardiovascular: Negative.  Negative for chest pain and palpitations.  Gastrointestinal: Negative.   Neurological: Negative.   Psychiatric/Behavioral: Negative.      Today's Vitals   11/04/18 1011  BP: 136/78  Pulse: 72  Temp: 97.8 F (36.6 C)  TempSrc: Oral  Weight: 224 lb 3.2 oz (101.7 kg)  Height: '5\' 1"'$  (1.549 m)  PainSc: 0-No pain   Body mass index is 42.36 kg/m.   Objective:  Physical Exam Vitals signs and nursing note reviewed.  Constitutional:      Appearance: Normal appearance.  HENT:     Head: Normocephalic and atraumatic.  Eyes:     Comments: Pale conjunctiva  Cardiovascular:     Rate and Rhythm: Normal rate and regular rhythm.     Heart sounds: Normal heart sounds.  Pulmonary:     Effort: Pulmonary effort is normal.     Breath sounds: Normal breath sounds.  Skin:    General: Skin is warm.  Neurological:     General: No focal deficit present.     Mental Status: She is alert.  Psychiatric:        Mood and Affect: Mood normal.        Behavior: Behavior normal.         Assessment And Plan:     1. Essential hypertension  Fair control.  She will continue with current meds. She is encouraged to avoid adding salt to her foods. She is advised of optimal bp less than 120/80.  - BMP8+EGFR  2. Pure hypercholesterolemia  I will check a fasting lipid panel and LFTS. She will continue with current meds. She is encouraged to avoid fried foods and to incorporate more exercise into her daily routine.   3. Hypothyroidism, unspecified type  I will check thyroid panel and adjust meds as needed.  - TSH  4. Other abnormal  glucose  HER A1C HAS BEEN ELEVATED IN THE PAST. I WILL CHECK AN A1C, BMET TODAY. SHE WAS ENCOURAGED TO AVOID SUGARY BEVERAGES AND PROCESSED FOODS INCLUDNG BREADS, RICE AND PASTA.  - Hemoglobin A1c  5. Dyspnea on exertion  I think her sx are due to deconditioning. She does not have SOB at rest. She is encouraged to start walking her driveway daily to help build up her endurance. I will also check CBC to evaluate for anemia.   - CBC no Diff  6. Class 3 severe  obesity due to excess calories with serious comorbidity and body mass index (BMI) of 45.0 to 49.9 in adult [facility])  Importance of achieving optimal weight to decrease risk of cardiovascular disease and cancers was discussed with the patient in full detail. She is encouraged to start slowly - start with 10 minutes twice daily at least three to four days per week and to gradually build to 30 minutes five days weekly. She was given tips to incorporate more activity into her daily routine - take stairs when possible, park farther away from grocery stores, etc.    7. Drug therapy  She has been on PPI therapy for several years.   - Vitamin B12        [name], MD    THE PATIENT IS ENCOURAGED TO PRACTICE SOCIAL DISTANCING DUE TO THE COVID-19 PANDEMIC.

## 2018-11-04 NOTE — Patient Instructions (Signed)

## 2018-11-05 LAB — BMP8+EGFR
BUN/Creatinine Ratio: 34 — ABNORMAL HIGH (ref 12–28)
BUN: 20 mg/dL (ref 8–27)
CO2: 23 mmol/L (ref 20–29)
Calcium: 11 mg/dL — ABNORMAL HIGH (ref 8.7–10.3)
Chloride: 103 mmol/L (ref 96–106)
Creatinine, Ser: 0.58 mg/dL (ref 0.57–1.00)
GFR calc Af Amer: 101 mL/min/{1.73_m2} (ref >59)
GFR calc non Af Amer: 88 mL/min/{1.73_m2} (ref >59)
Glucose: 104 mg/dL — ABNORMAL HIGH (ref 65–99)
Potassium: 4.4 mmol/L (ref 3.5–5.2)
Sodium: 141 mmol/L (ref 134–144)

## 2018-11-05 LAB — CBC
Hematocrit: 46.4 % (ref 34.0–46.6)
Hemoglobin: 15.7 g/dL (ref 11.1–15.9)
MCH: 30.5 pg (ref 26.6–33.0)
MCHC: 33.8 g/dL (ref 31.5–35.7)
MCV: 90 fL (ref 79–97)
Platelets: 177 10*3/uL (ref 150–450)
RBC: 5.14 x10E6/uL (ref 3.77–5.28)
RDW: 13.2 % (ref 11.7–15.4)
WBC: 4.9 10*3/uL (ref 3.4–10.8)

## 2018-11-05 LAB — LIPID PANEL
Chol/HDL Ratio: 2.7 ratio (ref 0.0–4.4)
Cholesterol, Total: 149 mg/dL (ref 100–199)
HDL: 55 mg/dL (ref >39)
LDL Calculated: 80 mg/dL (ref 0–99)
Triglycerides: 69 mg/dL (ref 0–149)
VLDL Cholesterol Cal: 14 mg/dL (ref 5–40)

## 2018-11-05 LAB — HEMOGLOBIN A1C
Est. average glucose Bld gHb Est-mCnc: 140 mg/dL
Hgb A1c MFr Bld: 6.5 % — ABNORMAL HIGH (ref 4.8–5.6)

## 2018-11-05 LAB — VITAMIN B12: Vitamin B-12: 769 pg/mL (ref 232–1245)

## 2018-11-05 LAB — TSH: TSH: 1.03 u[IU]/mL (ref 0.450–4.500)

## 2018-11-07 LAB — PROTEIN ELECTROPHORESIS

## 2018-11-14 LAB — SPECIMEN STATUS REPORT

## 2018-12-19 LAB — HM MAMMOGRAPHY: HM Mammogram: NORMAL (ref 0–4)

## 2018-12-24 ENCOUNTER — Encounter: Payer: Self-pay | Admitting: Internal Medicine

## 2018-12-28 ENCOUNTER — Other Ambulatory Visit: Payer: Self-pay | Admitting: Internal Medicine

## 2019-01-07 ENCOUNTER — Other Ambulatory Visit: Payer: Self-pay | Admitting: Orthopedic Surgery

## 2019-01-08 ENCOUNTER — Telehealth: Payer: Self-pay | Admitting: Internal Medicine

## 2019-01-08 NOTE — Telephone Encounter (Signed)
I called the patient to reschedule her AWV to an earlier date, but there was no answer. VDM (DD)

## 2019-01-17 ENCOUNTER — Other Ambulatory Visit: Payer: Self-pay | Admitting: Internal Medicine

## 2019-01-27 ENCOUNTER — Ambulatory Visit (INDEPENDENT_AMBULATORY_CARE_PROVIDER_SITE_OTHER): Payer: Medicare Other

## 2019-01-27 ENCOUNTER — Other Ambulatory Visit: Payer: Self-pay

## 2019-01-27 VITALS — BP 128/63 | HR 70 | Temp 96.2°F | Ht 63.0 in | Wt 219.0 lb

## 2019-01-27 DIAGNOSIS — Z0000 Encounter for general adult medical examination without abnormal findings: Secondary | ICD-10-CM

## 2019-01-27 NOTE — Patient Instructions (Signed)
[name] , Thank you for taking time to come for your Medicare Wellness Visit. I appreciate your ongoing commitment to your health goals. Please review the following plan we discussed and let me know if I can assist you in the future.   Screening recommendations/referrals: Colonoscopy: 08/2018 Mammogram: 12/2018 Bone Density: 08/2015 Recommended yearly ophthalmology/optometry visit for glaucoma screening and checkup Recommended yearly dental visit for hygiene and checkup  Vaccinations: Influenza vaccine: 04/2018 Pneumococcal vaccine: 04/2018 Tdap vaccine: 08/2016 Shingles vaccine: 12/2017    Advanced directives: copy in chart  Conditions/risks identified: obesity  Next appointment: 02/03/2019 at 10:15   Preventive Care 80 Years and Older, Female Preventive care refers to lifestyle choices and visits with your health care provider that can promote health and wellness. What does preventive care include?  A yearly physical exam. This is also called an annual well check.  Dental exams once or twice a year.  Routine eye exams. Ask your health care provider how often you should have your eyes checked.  Personal lifestyle choices, including:  Daily care of your teeth and gums.  Regular physical activity.  Eating a healthy diet.  Avoiding tobacco and drug use.  Limiting alcohol use.  Practicing safe sex.  Taking low-dose aspirin every day.  Taking vitamin and mineral supplements as recommended by your health care provider. What happens during an annual well check? The services and screenings done by your health care provider during your annual well check will depend on your age, overall health, lifestyle risk factors, and family history of disease. Counseling  Your health care provider may ask you questions about your:  Alcohol use.  Tobacco use.  Drug use.  Emotional well-being.  Home and relationship well-being.  Sexual activity.  Eating habits.  History of  falls.  Memory and ability to understand (cognition).  Work and work Statistician.  Reproductive health. Screening  You may have the following tests or measurements:  Height, weight, and BMI.  Blood pressure.  Lipid and cholesterol levels. These may be checked every 5 years, or more frequently if you are over 51 years old.  Skin check.  Lung cancer screening. You may have this screening every year starting at age 53 if you have a 30-pack-year history of smoking and currently smoke or have quit within the past 15 years.  Fecal occult blood test (FOBT) of the stool. You may have this test every year starting at age 72.  Flexible sigmoidoscopy or colonoscopy. You may have a sigmoidoscopy every 5 years or a colonoscopy every 10 years starting at age 90.  Hepatitis C blood test.  Hepatitis B blood test.  Sexually transmitted disease (STD) testing.  Diabetes screening. This is done by checking your blood sugar (glucose) after you have not eaten for a while (fasting). You may have this done every 1-3 years.  Bone density scan. This is done to screen for osteoporosis. You may have this done starting at age 8.  Mammogram. This may be done every 1-2 years. Talk to your health care provider about how often you should have regular mammograms. Talk with your health care provider about your test results, treatment options, and if necessary, the need for more tests. Vaccines  Your health care provider may recommend certain vaccines, such as:  Influenza vaccine. This is recommended every year.  Tetanus, diphtheria, and acellular pertussis (Tdap, Td) vaccine. You may need a Td booster every 10 years.  Zoster vaccine. You may need this after age 62.  Pneumococcal 13-valent  conjugate (PCV13) vaccine. One dose is recommended after age 27.  Pneumococcal polysaccharide (PPSV23) vaccine. One dose is recommended after age 44. Talk to your health care provider about which screenings and  vaccines you need and how often you need them. This information is not intended to replace advice given to you by your health care provider. Make sure you discuss any questions you have with your health care provider. Document Released: 07/22/2015 Document Revised: 03/14/2016 Document Reviewed: 04/26/2015 Elsevier Interactive Patient Education  2017 [place] Prevention in the Home Falls can cause injuries. They can happen to people of all ages. There are many things you can do to make your home safe and to help prevent falls. What can I do on the outside of my home?  Regularly fix the edges of walkways and driveways and fix any cracks.  Remove anything that might make you trip as you walk through a door, such as a raised step or threshold.  Trim any bushes or trees on the path to your home.  Use bright outdoor lighting.  Clear any walking paths of anything that might make someone trip, such as rocks or tools.  Regularly check to see if handrails are loose or broken. Make sure that both sides of any steps have handrails.  Any raised decks and porches should have guardrails on the edges.  Have any leaves, snow, or ice cleared regularly.  Use sand or salt on walking paths during winter.  Clean up any spills in your garage right away. This includes oil or grease spills. What can I do in the bathroom?  Use night lights.  Install grab bars by the toilet and in the tub and shower. Do not use towel bars as grab bars.  Use non-skid mats or decals in the tub or shower.  If you need to sit down in the shower, use a plastic, non-slip stool.  Keep the floor dry. Clean up any water that spills on the floor as soon as it happens.  Remove soap buildup in the tub or shower regularly.  Attach bath mats securely with double-sided non-slip rug tape.  Do not have throw rugs and other things on the floor that can make you trip. What can I do in the bedroom?  Use night lights.   Make sure that you have a light by your bed that is easy to reach.  Do not use any sheets or blankets that are too big for your bed. They should not hang down onto the floor.  Have a firm chair that has side arms. You can use this for support while you get dressed.  Do not have throw rugs and other things on the floor that can make you trip. What can I do in the kitchen?  Clean up any spills right away.  Avoid walking on wet floors.  Keep items that you use a lot in easy-to-reach places.  If you need to reach something above you, use a strong step stool that has a grab bar.  Keep electrical cords out of the way.  Do not use floor polish or wax that makes floors slippery. If you must use wax, use non-skid floor wax.  Do not have throw rugs and other things on the floor that can make you trip. What can I do with my stairs?  Do not leave any items on the stairs.  Make sure that there are handrails on both sides of the stairs and use them. Fix handrails  that are broken or loose. Make sure that handrails are as long as the stairways.  Check any carpeting to make sure that it is firmly attached to the stairs. Fix any carpet that is loose or worn.  Avoid having throw rugs at the top or bottom of the stairs. If you do have throw rugs, attach them to the floor with carpet tape.  Make sure that you have a light switch at the top of the stairs and the bottom of the stairs. If you do not have them, ask someone to add them for you. What else can I do to help prevent falls?  Wear shoes that:  Do not have high heels.  Have rubber bottoms.  Are comfortable and fit you well.  Are closed at the toe. Do not wear sandals.  If you use a stepladder:  Make sure that it is fully opened. Do not climb a closed stepladder.  Make sure that both sides of the stepladder are locked into place.  Ask someone to hold it for you, if possible.  Clearly mark and make sure that you can see:  Any  grab bars or handrails.  First and last steps.  Where the edge of each step is.  Use tools that help you move around (mobility aids) if they are needed. These include:  Canes.  Walkers.  Scooters.  Crutches.  Turn on the lights when you go into a dark area. Replace any light bulbs as soon as they burn out.  Set up your furniture so you have a clear path. Avoid moving your furniture around.  If any of your floors are uneven, fix them.  If there are any pets around you, be aware of where they are.  Review your medicines with your doctor. Some medicines can make you feel dizzy. This can increase your chance of falling. Ask your doctor what other things that you can do to help prevent falls. This information is not intended to replace advice given to you by your health care provider. Make sure you discuss any questions you have with your health care provider. Document Released: 04/21/2009 Document Revised: 12/01/2015 Document Reviewed: 07/30/2014 Elsevier Interactive Patient Education  2017 [employer].

## 2019-01-27 NOTE — Progress Notes (Signed)
Subjective:   [name] is a 78 y.o. female who presents for Medicare Annual (Subsequent) preventive examination.  This visit type was conducted due to national recommendations for restrictions regarding the COVID-19 Pandemic (e.g. social distancing). This format is felt to be most appropriate for this patient at this time. All issues noted in this document were discussed and addressed. No physical exam was performed (except for noted visual exam findings with Video Visits). This patient, [name], has given permission to perform this visit via telephone. Vital signs may be absent or patient reported.  Patient location:  At home  Nurse location:  [place] office     Review of Systems:  n/a Cardiac Risk Factors include: dyslipidemia;advanced age (>9men, >38 women);hypertension;sedentary lifestyle;obesity (BMI >30kg/m2)     Objective:     Vitals: BP 128/63 Comment: per patient  Pulse 70 Comment: per patient  Temp (!) 96.2 F (35.7 C) Comment: per patient  Ht 5\' 3"  (1.6 m) Comment: per patient  Wt 219 lb (99.3 kg) Comment: per patient  BMI 38.79 kg/m   Body mass index is 38.79 kg/m.  Advanced Directives 01/27/2019 05/01/2018 04/29/2018 10/23/2017 04/27/2016 12/10/2013 12/29/2012  Does Patient Have a Medical Advance Directive? Yes Yes Yes Yes Yes Patient has advance directive, copy not in chart Patient has advance directive, copy not in chart  Type of Advance Directive Living will;Healthcare Power of [place] will [place];Living will - [place];Living will - [place];Living will  Does patient want to make changes to medical advance directive? - - No - Patient declined No - Patient declined - - -  Copy of [place] in Chart? Yes - validated most recent copy scanned in chart (See row information) - Yes - - - Copy requested from family  Pre-existing out of facility DNR order (yellow form or pink  MOST form) - - - - - - No    Tobacco Social History   Tobacco Use  Smoking Status Never Smoker  Smokeless Tobacco Never Used     Counseling given: Not Answered   Clinical Intake:  Pre-visit preparation completed: Yes  Pain : No/denies pain     Nutritional Status: BMI > 30  Obese Nutritional Risks: None Diabetes: No  How often do you need to have someone help you when you read instructions, pamphlets, or other written materials from your doctor or pharmacy?: 1 - Never What is the last grade level you completed in school?: 12th grade  Interpreter Needed?: No  Information entered by :: NAllen LPN  Past Medical History:  Diagnosis Date  . Arthritis    RIGHT KNEE PAIN AND OA  . Chest pain 05/03/2015  . Elevated cholesterol   . GERD (gastroesophageal reflux disease)   . Hypertension   . Hypothyroidism   . IBS (irritable bowel syndrome)   . Rotator cuff disorder    BOTH SHOULDERS - NO SURGERY - AND STATES LIMITATIONS IN ARM MOVEMENT  . Shortness of breath 05/03/2015  . Swelling    CHRONIC SWELLING RIGHT HAND AND BOTH FEET AND BOTH LEGS  . Vertigo    Past Surgical History:  Procedure Laterality Date  . ABDOMINAL HYSTERECTOMY  1985  . BREAST SURGERY  1985   RIGHT BREAST BIOPSY - BENIGN  . CHOLECYSTECTOMY  1975  . EYE SURGERY  1995   BILATERAL CATARACT EXTRACTIONS; SURGERY FOR MACULAR HOLE   . [place]  . 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 Diabetes Mother   . Dementia Mother   . Diabetes Father   . Diabetes Sister   . Breast cancer Sister   . Kidney cancer Sister   . Lung cancer Sister   . Lung cancer Maternal Aunt   . Pancreatic cancer Maternal Aunt    Social History   Socioeconomic History  . Marital status: Widowed    Spouse name: nathaniel   . Number of children: 4  . Years of education: 87  . Highest education level: Not on file  Occupational History  . Occupation: Retired  Scientific laboratory technician  . Financial resource strain: Not hard at all  . Food insecurity    Worry: Never true    Inability: Never true  . Transportation needs    Medical: No    Non-medical: No  Tobacco Use  . Smoking status: Never Smoker  . Smokeless tobacco: Never Used  Substance and Sexual Activity  . Alcohol use: Yes    Comment: OCCAS ALCOHOL  . Drug use: No  . Sexual activity: Not Currently  Lifestyle  . Physical activity    Days per week: 0 days    Minutes per session: 0 min  . Stress: Not at all  Relationships  . Social Herbalist on phone: Not on file    Gets together: Not on file    Attends religious service: Not on file    Active member of club or organization: Not on file    Attends meetings of clubs or organizations: Not on file    Relationship status: Not on file  Other Topics Concern  . Not on file  Social History Narrative   [name]   Patient is retired.    Patient has 4 children.    Patient has a high school education.    Patient is widowed   Right handed      Epworth Sleepiness Scale = 1 (as of 05/03/2015)    Outpatient Encounter Medications as of 01/27/2019  Medication Sig  . Aspirin (ECOTRIN PO) Take 80 mg by mouth daily.  Marland Kitchen BENICAR 20 MG tablet TAKE 1 TABLET BY MOUTH  DAILY  . bimatoprost (LUMIGAN) 0.01 % SOLN Place 1 drop into both eyes at bedtime.   . famotidine (PEPCID) 20 MG tablet Take 20 mg by mouth daily.  . furosemide (LASIX) 20 MG tablet Take 1 tablet (20 mg total) by mouth every morning.  Marland Kitchen Lifitegrast (XIIDRA) 5 % SOLN Apply 1 drop to eye 2 (two) times a day.  . Magnesium 400 MG CAPS Take 400 mg by mouth daily after supper.  . Multiple Vitamins-Minerals (CENTRUM SILVER PO) Take 1 tablet by mouth daily.   . Multiple Vitamins-Minerals (EMERGEN-C VITAMIN C PO) Take 1 Package by  mouth daily. Uses during the winter  . olopatadine (PATANOL) 0.1 % ophthalmic solution Place 1 drop into both eyes daily.   . polyethylene glycol (MIRALAX / GLYCOLAX) packet Take 17 g by mouth daily.  . Polyvinyl Alcohol-Povidone (REFRESH OP) Place 1 drop into both eyes daily.   Marland Kitchen SYNTHROID 75 MCG tablet Take 1 tablet (75 mcg total) by mouth daily before breakfast. DAW1, please fill Brand name Synthroid Take 1 tablet by mouth Monday - Saturday  . vitamin B-6 (  PYRIDOXINE) 25 MG tablet Take 25 mg by mouth daily.  . CRESTOR 10 MG tablet Take 1 tablet (10 mg total) by mouth daily.  Marland Kitchen omeprazole (PRILOSEC) 20 MG capsule Take 20 mg by mouth daily.   No facility-administered encounter medications on file as of 01/27/2019.     Activities of Daily Living In your present state of health, do you have any difficulty performing the following activities: 01/27/2019 05/01/2018  Hearing? N N  Vision? N N  Difficulty concentrating or making decisions? N N  Walking or climbing stairs? Y Y  Comment due to knee issue had knee surgery  Dressing or bathing? N N  Doing errands, shopping? N N  Preparing Food and eating ? N N  Using the Toilet? N N  In the past six months, have you accidently leaked urine? N N  Do you have problems with loss of bowel control? N N  Managing your Medications? N N  Managing your Finances? N N  Housekeeping or managing your Housekeeping? N N  Some recent data might be hidden    Patient Care Team: Glendale Chard, MD as PCP - General (Internal Medicine)    Assessment:   This is a routine wellness examination for [name].  Exercise Activities and Dietary recommendations Current Exercise Habits: The patient does not participate in regular exercise at present  Goals    . DIET - INCREASE WATER INTAKE (pt-stated)    . Patient Stated     01/27/2019 patient has no goal       Fall Risk Fall Risk  01/27/2019 11/04/2018 05/01/2018 05/01/2018 12/10/2013  Falls in the past year? 0 0  No No No  Number falls in past yr: 0 - - - -  Risk for fall due to : Medication side effect - - Medication side effect -  Follow up Falls evaluation completed;Falls prevention discussed - - - -   Is the patient's home free of loose throw rugs in walkways, pet beds, electrical cords, etc?   yes      Grab bars in the bathroom? yes      Handrails on the stairs?   n/a      Adequate lighting?   yes  Timed Get Up and Go performed: n/a  Depression Screen PHQ 2/9 Scores 01/27/2019 11/04/2018 05/01/2018 05/01/2018  PHQ - 2 Score 0 0 0 0  PHQ- 9 Score 0 - - 0     Cognitive Function     6CIT Screen 01/27/2019 05/01/2018  What Year? 0 points 0 points  What month? 0 points 0 points  What time? 0 points 0 points  Count back from 20 0 points 0 points  Months in reverse 0 points 0 points  Repeat phrase 0 points 0 points  Total Score 0 0    Immunization History  Administered Date(s) Administered  . Influenza-Unspecified 04/08/2013, 04/28/2018  . Pneumococcal Conjugate-13 04/28/2018  . Pneumococcal Polysaccharide-23 08/15/2016  . Tdap 08/15/2016  . Zoster Recombinat (Shingrix) 10/30/2017, 12/25/2017    Qualifies for Shingles Vaccine? yes  Screening Tests Health Maintenance  Topic Date Due  . INFLUENZA VACCINE  02/07/2019  . TETANUS/TDAP  08/15/2026  . DEXA SCAN  Completed  . PNA vac Low Risk Adult  Completed    Cancer Screenings: Lung: Low Dose CT Chest recommended if Age 80-80 years, 30 pack-year currently smoking OR have quit w/in 15years. Patient does not qualify. Breast:  Up to date on Mammogram? Yes   Up to date of Bone  Density/Dexa? Yes Colorectal: up to date  Additional Screenings: : Hepatitis C Screening: n/a     Plan:    6 CIT was 0. Patient has no goals set for this year.   I have personally reviewed and noted the following in the patient's chart:   . Medical and social history . Use of alcohol, tobacco or illicit drugs  . Current medications and supplements  . Functional ability and status . Nutritional status . Physical activity . Advanced directives . List of other physicians . Hospitalizations, surgeries, and ER visits in previous 12 months . Vitals . Screenings to include cognitive, depression, and falls . Referrals and appointments  In addition, I have reviewed and discussed with patient certain preventive protocols, quality metrics, and best practice recommendations. A written personalized care plan for preventive services as well as general preventive health recommendations were provided to patient.     Kellie Simmering, LPN  2/83/1517

## 2019-01-29 ENCOUNTER — Other Ambulatory Visit: Payer: Self-pay

## 2019-01-29 ENCOUNTER — Encounter (HOSPITAL_BASED_OUTPATIENT_CLINIC_OR_DEPARTMENT_OTHER): Payer: Self-pay

## 2019-02-02 ENCOUNTER — Other Ambulatory Visit: Payer: Self-pay

## 2019-02-02 ENCOUNTER — Other Ambulatory Visit (HOSPITAL_COMMUNITY): Payer: Medicare Other

## 2019-02-02 ENCOUNTER — Encounter (HOSPITAL_BASED_OUTPATIENT_CLINIC_OR_DEPARTMENT_OTHER)
Admission: RE | Admit: 2019-02-02 | Discharge: 2019-02-02 | Disposition: A | Discharge status: Home / Self Care | Payer: Medicare Other | Source: Ambulatory Visit | Attending: Orthopedic Surgery | Admitting: Orthopedic Surgery

## 2019-02-02 DIAGNOSIS — Z881 Allergy status to other antibiotic agents status: Secondary | ICD-10-CM | POA: Diagnosis not present

## 2019-02-02 DIAGNOSIS — Z20822 Contact with and (suspected) exposure to covid-19: Secondary | ICD-10-CM

## 2019-02-02 DIAGNOSIS — M1990 Unspecified osteoarthritis, unspecified site: Secondary | ICD-10-CM | POA: Diagnosis not present

## 2019-02-02 DIAGNOSIS — K219 Gastro-esophageal reflux disease without esophagitis: Secondary | ICD-10-CM | POA: Diagnosis not present

## 2019-02-02 DIAGNOSIS — Z20828 Contact with and (suspected) exposure to other viral communicable diseases: Secondary | ICD-10-CM | POA: Insufficient documentation

## 2019-02-02 DIAGNOSIS — G5602 Carpal tunnel syndrome, left upper limb: Secondary | ICD-10-CM | POA: Diagnosis present

## 2019-02-02 DIAGNOSIS — R9431 Abnormal electrocardiogram [ECG] [EKG]: Secondary | ICD-10-CM | POA: Insufficient documentation

## 2019-02-02 DIAGNOSIS — E039 Hypothyroidism, unspecified: Secondary | ICD-10-CM | POA: Diagnosis not present

## 2019-02-02 DIAGNOSIS — E7800 Pure hypercholesterolemia, unspecified: Secondary | ICD-10-CM | POA: Diagnosis not present

## 2019-02-02 DIAGNOSIS — I10 Essential (primary) hypertension: Secondary | ICD-10-CM | POA: Insufficient documentation

## 2019-02-02 DIAGNOSIS — Z885 Allergy status to narcotic agent status: Secondary | ICD-10-CM | POA: Diagnosis not present

## 2019-02-02 DIAGNOSIS — G5603 Carpal tunnel syndrome, bilateral upper limbs: Secondary | ICD-10-CM | POA: Diagnosis not present

## 2019-02-02 DIAGNOSIS — I444 Left anterior fascicular block: Secondary | ICD-10-CM | POA: Insufficient documentation

## 2019-02-02 DIAGNOSIS — Z6839 Body mass index (BMI) 39.0-39.9, adult: Secondary | ICD-10-CM | POA: Diagnosis not present

## 2019-02-02 DIAGNOSIS — Z96651 Presence of right artificial knee joint: Secondary | ICD-10-CM | POA: Diagnosis not present

## 2019-02-02 DIAGNOSIS — Z01818 Encounter for other preprocedural examination: Secondary | ICD-10-CM | POA: Insufficient documentation

## 2019-02-02 LAB — BASIC METABOLIC PANEL
Anion gap: 7 (ref 5–15)
BUN: 15 mg/dL (ref 8–23)
CO2: 25 mmol/L (ref 22–32)
Calcium: 10.3 mg/dL (ref 8.9–10.3)
Chloride: 109 mmol/L (ref 98–111)
Creatinine, Ser: 0.74 mg/dL (ref 0.44–1.00)
GFR calc Af Amer: >60 mL/min (ref >60)
GFR calc non Af Amer: >60 mL/min (ref >60)
Glucose, Bld: 110 mg/dL — ABNORMAL HIGH (ref 70–99)
Potassium: 4.5 mmol/L (ref 3.5–5.1)
Sodium: 141 mmol/L (ref 135–145)

## 2019-02-02 NOTE — Progress Notes (Signed)
G2 pre surgical drink given to pt with instructions to finish by 0800 DOS. Pt verbalized understanding

## 2019-02-03 ENCOUNTER — Ambulatory Visit: Payer: Medicare Other | Admitting: Internal Medicine

## 2019-02-03 ENCOUNTER — Other Ambulatory Visit (HOSPITAL_COMMUNITY)
Admission: RE | Admit: 2019-02-03 | Discharge: 2019-02-03 | Disposition: A | Discharge status: Home / Self Care | Payer: Medicare Other | Source: Ambulatory Visit | Attending: Orthopedic Surgery | Admitting: Orthopedic Surgery

## 2019-02-03 DIAGNOSIS — G5603 Carpal tunnel syndrome, bilateral upper limbs: Secondary | ICD-10-CM | POA: Diagnosis not present

## 2019-02-03 LAB — SARS CORONAVIRUS 2 (TAT 6-24 HRS): SARS Coronavirus 2: NEGATIVE

## 2019-02-03 LAB — NOVEL CORONAVIRUS, NAA: SARS-CoV-2, NAA: NOT DETECTED

## 2019-02-05 ENCOUNTER — Ambulatory Visit (HOSPITAL_BASED_OUTPATIENT_CLINIC_OR_DEPARTMENT_OTHER): Payer: Medicare Other | Admitting: Anesthesiology

## 2019-02-05 ENCOUNTER — Encounter (HOSPITAL_BASED_OUTPATIENT_CLINIC_OR_DEPARTMENT_OTHER): Payer: Self-pay | Admitting: *Deleted

## 2019-02-05 ENCOUNTER — Ambulatory Visit (HOSPITAL_BASED_OUTPATIENT_CLINIC_OR_DEPARTMENT_OTHER)
Admission: RE | Admit: 2019-02-05 | Discharge: 2019-02-05 | Disposition: A | Discharge status: Home / Self Care | Payer: Medicare Other | Attending: Orthopedic Surgery | Admitting: Orthopedic Surgery

## 2019-02-05 ENCOUNTER — Encounter (HOSPITAL_BASED_OUTPATIENT_CLINIC_OR_DEPARTMENT_OTHER)
Admission: RE | Disposition: A | Discharge status: Home / Self Care | Payer: Self-pay | Source: Home / Self Care | Attending: Orthopedic Surgery

## 2019-02-05 ENCOUNTER — Other Ambulatory Visit: Payer: Self-pay

## 2019-02-05 DIAGNOSIS — E7800 Pure hypercholesterolemia, unspecified: Secondary | ICD-10-CM | POA: Insufficient documentation

## 2019-02-05 DIAGNOSIS — K219 Gastro-esophageal reflux disease without esophagitis: Secondary | ICD-10-CM | POA: Diagnosis not present

## 2019-02-05 DIAGNOSIS — Z6839 Body mass index (BMI) 39.0-39.9, adult: Secondary | ICD-10-CM | POA: Insufficient documentation

## 2019-02-05 DIAGNOSIS — G5603 Carpal tunnel syndrome, bilateral upper limbs: Secondary | ICD-10-CM | POA: Insufficient documentation

## 2019-02-05 DIAGNOSIS — M1990 Unspecified osteoarthritis, unspecified site: Secondary | ICD-10-CM | POA: Insufficient documentation

## 2019-02-05 DIAGNOSIS — Z20828 Contact with and (suspected) exposure to other viral communicable diseases: Secondary | ICD-10-CM | POA: Insufficient documentation

## 2019-02-05 DIAGNOSIS — Z885 Allergy status to narcotic agent status: Secondary | ICD-10-CM | POA: Insufficient documentation

## 2019-02-05 DIAGNOSIS — Z881 Allergy status to other antibiotic agents status: Secondary | ICD-10-CM | POA: Insufficient documentation

## 2019-02-05 DIAGNOSIS — I10 Essential (primary) hypertension: Secondary | ICD-10-CM | POA: Insufficient documentation

## 2019-02-05 DIAGNOSIS — E039 Hypothyroidism, unspecified: Secondary | ICD-10-CM | POA: Insufficient documentation

## 2019-02-05 DIAGNOSIS — Z96651 Presence of right artificial knee joint: Secondary | ICD-10-CM | POA: Insufficient documentation

## 2019-02-05 HISTORY — PX: CARPAL TUNNEL RELEASE: SHX101

## 2019-02-05 SURGERY — CARPAL TUNNEL RELEASE
Anesthesia: Regional | Site: Wrist | Laterality: Left

## 2019-02-05 MED ORDER — FENTANYL CITRATE (PF) 100 MCG/2ML IJ SOLN
INTRAMUSCULAR | Status: AC
  Filled 2019-02-05: qty 2

## 2019-02-05 MED ORDER — ACETAMINOPHEN 325 MG PO TABS: 325.0000 mg | ORAL_TABLET | ORAL | Status: DC | PRN

## 2019-02-05 MED ORDER — BUPIVACAINE HCL (PF) 0.25 % IJ SOLN
INTRAMUSCULAR | Status: DC | PRN
  Administered 2019-02-05: 9 mL

## 2019-02-05 MED ORDER — LACTATED RINGERS IV SOLN
INTRAVENOUS | Status: DC
  Administered 2019-02-05: 11:00:00 via INTRAVENOUS

## 2019-02-05 MED ORDER — EPHEDRINE 5 MG/ML INJ
INTRAVENOUS | Status: AC
  Filled 2019-02-05: qty 10

## 2019-02-05 MED ORDER — ONDANSETRON HCL 4 MG/2ML IJ SOLN
INTRAMUSCULAR | Status: AC
  Filled 2019-02-05: qty 2

## 2019-02-05 MED ORDER — ONDANSETRON HCL 4 MG/2ML IJ SOLN
INTRAMUSCULAR | Status: DC | PRN
  Administered 2019-02-05: 4 mg via INTRAVENOUS

## 2019-02-05 MED ORDER — CEFAZOLIN SODIUM-DEXTROSE 2-4 GM/100ML-% IV SOLN
INTRAVENOUS | Status: AC
  Filled 2019-02-05: qty 100

## 2019-02-05 MED ORDER — MEPERIDINE HCL 25 MG/ML IJ SOLN: 6.2500 mg | INTRAMUSCULAR | Status: DC | PRN

## 2019-02-05 MED ORDER — CHLORHEXIDINE GLUCONATE 4 % EX LIQD: 60.0000 mL | Freq: Once | CUTANEOUS | Status: DC

## 2019-02-05 MED ORDER — MIDAZOLAM HCL 5 MG/5ML IJ SOLN
INTRAMUSCULAR | Status: DC | PRN
  Administered 2019-02-05: .5 mg via INTRAVENOUS

## 2019-02-05 MED ORDER — CEFAZOLIN SODIUM-DEXTROSE 2-4 GM/100ML-% IV SOLN
2.0000 g | INTRAVENOUS | Status: AC
  Administered 2019-02-05: 12:00:00 2 g via INTRAVENOUS

## 2019-02-05 MED ORDER — CELECOXIB 200 MG PO CAPS: 200.0000 mg | ORAL_CAPSULE | Freq: Two times a day (BID) | ORAL | 0 refills | Status: DC

## 2019-02-05 MED ORDER — SUCCINYLCHOLINE CHLORIDE 200 MG/10ML IV SOSY
PREFILLED_SYRINGE | INTRAVENOUS | Status: AC
  Filled 2019-02-05: qty 10

## 2019-02-05 MED ORDER — ACETAMINOPHEN 160 MG/5ML PO SOLN: 325.0000 mg | ORAL | Status: DC | PRN

## 2019-02-05 MED ORDER — ONDANSETRON HCL 4 MG/2ML IJ SOLN: 4.0000 mg | Freq: Once | INTRAMUSCULAR | Status: DC | PRN

## 2019-02-05 MED ORDER — LIDOCAINE 2% (20 MG/ML) 5 ML SYRINGE
INTRAMUSCULAR | Status: AC
  Filled 2019-02-05: qty 5

## 2019-02-05 MED ORDER — OXYCODONE HCL 5 MG PO TABS: 5.0000 mg | ORAL_TABLET | Freq: Once | ORAL | Status: DC | PRN

## 2019-02-05 MED ORDER — PHENYLEPHRINE 40 MCG/ML (10ML) SYRINGE FOR IV PUSH (FOR BLOOD PRESSURE SUPPORT)
PREFILLED_SYRINGE | INTRAVENOUS | Status: AC
  Filled 2019-02-05: qty 10

## 2019-02-05 MED ORDER — FENTANYL CITRATE (PF) 100 MCG/2ML IJ SOLN
INTRAMUSCULAR | Status: DC | PRN
  Administered 2019-02-05: 50 ug via INTRAVENOUS

## 2019-02-05 MED ORDER — MIDAZOLAM HCL 2 MG/2ML IJ SOLN
INTRAMUSCULAR | Status: AC
  Filled 2019-02-05: qty 2

## 2019-02-05 MED ORDER — LIDOCAINE HCL (PF) 0.5 % IJ SOLN
INTRAMUSCULAR | Status: DC | PRN
  Administered 2019-02-05: 30 mL via INTRAVENOUS

## 2019-02-05 MED ORDER — OXYCODONE HCL 5 MG/5ML PO SOLN: 5.0000 mg | Freq: Once | ORAL | Status: DC | PRN

## 2019-02-05 MED ORDER — FENTANYL CITRATE (PF) 100 MCG/2ML IJ SOLN: 25.0000 ug | INTRAMUSCULAR | Status: DC | PRN

## 2019-02-05 MED ORDER — PROPOFOL 10 MG/ML IV BOLUS
INTRAVENOUS | Status: DC | PRN
  Administered 2019-02-05 (×2): 20 mg via INTRAVENOUS

## 2019-02-05 SURGICAL SUPPLY — 37 items
APL PRP STRL LF DISP 70% ISPRP (MISCELLANEOUS) ×1
BLADE SURG 15 STRL LF DISP TIS (BLADE) ×1 IMPLANT
BLADE SURG 15 STRL SS (BLADE) ×1
BNDG COHESIVE 3X5 TAN STRL LF (GAUZE/BANDAGES/DRESSINGS) ×2 IMPLANT
BNDG ESMARK 4X9 LF (GAUZE/BANDAGES/DRESSINGS) IMPLANT
BNDG GAUZE ELAST 4 BULKY (GAUZE/BANDAGES/DRESSINGS) ×2 IMPLANT
CHLORAPREP W/TINT 26 (MISCELLANEOUS) ×2 IMPLANT
CORD BIPOLAR FORCEPS 12FT (ELECTRODE) ×2 IMPLANT
COVER BACK TABLE REUSABLE LG (DRAPES) ×2 IMPLANT
COVER MAYO STAND REUSABLE (DRAPES) ×2 IMPLANT
COVER WAND RF STERILE (DRAPES) IMPLANT
CUFF TOURN SGL QUICK 18X4 (TOURNIQUET CUFF) ×2 IMPLANT
DRAPE EXTREMITY T 121X128X90 (DISPOSABLE) ×2 IMPLANT
DRAPE SURG 17X23 STRL (DRAPES) ×2 IMPLANT
DRSG PAD ABDOMINAL 8X10 ST (GAUZE/BANDAGES/DRESSINGS) ×2 IMPLANT
GAUZE SPONGE 4X4 12PLY STRL (GAUZE/BANDAGES/DRESSINGS) ×2 IMPLANT
GAUZE XEROFORM 1X8 LF (GAUZE/BANDAGES/DRESSINGS) ×2 IMPLANT
GLOVE BIO SURGEON STRL SZ 6.5 (GLOVE) ×1 IMPLANT
GLOVE BIOGEL PI IND STRL 6.5 (GLOVE) IMPLANT
GLOVE BIOGEL PI IND STRL 8.5 (GLOVE) ×1 IMPLANT
GLOVE BIOGEL PI INDICATOR 6.5 (GLOVE) ×1
GLOVE BIOGEL PI INDICATOR 8.5 (GLOVE) ×1
GLOVE SURG ORTHO 8.0 STRL STRW (GLOVE) ×2 IMPLANT
GOWN STRL REUS W/ TWL LRG LVL3 (GOWN DISPOSABLE) ×1 IMPLANT
GOWN STRL REUS W/TWL LRG LVL3 (GOWN DISPOSABLE) ×2
GOWN STRL REUS W/TWL XL LVL3 (GOWN DISPOSABLE) ×2 IMPLANT
NDL PRECISIONGLIDE 27X1.5 (NEEDLE) IMPLANT
NEEDLE PRECISIONGLIDE 27X1.5 (NEEDLE) ×2 IMPLANT
NS IRRIG 1000ML POUR BTL (IV SOLUTION) ×2 IMPLANT
PACK BASIN DAY SURGERY FS (CUSTOM PROCEDURE TRAY) ×2 IMPLANT
STOCKINETTE 4X48 STRL (DRAPES) ×2 IMPLANT
SUT ETHILON 4 0 PS 2 18 (SUTURE) ×2 IMPLANT
SUT VICRYL 4-0 PS2 18IN ABS (SUTURE) IMPLANT
SYR BULB 3OZ (MISCELLANEOUS) ×2 IMPLANT
SYR CONTROL 10ML LL (SYRINGE) ×1 IMPLANT
TOWEL GREEN STERILE FF (TOWEL DISPOSABLE) ×2 IMPLANT
UNDERPAD 30X30 (UNDERPADS AND DIAPERS) ×2 IMPLANT

## 2019-02-05 NOTE — Discharge Instructions (Addendum)
[facility] Instructions °Hand Surgery ° °Wound Care: °Keep your hand elevated above the level of your heart.  Do not allow it to dangle by your side.  Keep the dressing dry and do not remove it unless your doctor advises you to do so.  He will usually change it at the time of your post-op visit.  Moving your fingers is advised to stimulate circulation but will depend on the site of your surgery.  If you have a splint applied, your doctor will advise you regarding movement. ° °Activity: °Do not drive or operate machinery today.  Rest today and then you may return to your normal activity and work as indicated by your physician. ° °Diet:  °Drink liquids today or eat a light diet.  You may resume a regular diet tomorrow.   ° °General expectations: °Pain for two to three days. °Fingers may become slightly swollen. ° °Call your doctor if any of the following occur: °Severe pain not relieved by pain medication. °Elevated temperature. °Dressing soaked with blood. °Inability to move fingers. °White or bluish color to fingers. ° °Regional Anesthesia Blocks ° °1. Numbness or the inability to move the "blocked" extremity may last from 3-48 hours after placement. The length of time depends on the medication injected and your individual response to the medication. If the numbness is not going away after 48 hours, call your surgeon. ° °2. The extremity that is blocked will need to be protected until the numbness is gone and the  Strength has returned. Because you cannot feel it, you will need to take extra care to avoid injury. Because it may be weak, you may have difficulty moving it or using it. You may not know what position it is in without looking at it while the block is in effect. ° °3. For blocks in the legs and feet, returning to weight bearing and walking needs to be done carefully. You will need to wait until the numbness is entirely gone and the strength has returned. You should be able to move your leg and foot  normally before you try and bear weight or walk. You will need someone to be with you when you first try to ensure you do not fall and possibly risk injury. ° °4. Bruising and tenderness at the needle site are common side effects and will resolve in a few days. ° °5. Persistent numbness or new problems with movement should be communicated to the surgeon or the [facility] ([phone])/ [facility] ([phone]). ° °

## 2019-02-05 NOTE — Anesthesia Postprocedure Evaluation (Signed)
Anesthesia Post Note  Patient: [name]  Procedure(s) Performed: LEFT CARPAL TUNNEL RELEASE (Left Wrist)     Patient location during evaluation: PACU Anesthesia Type: Bier Block Level of consciousness: awake and alert and oriented Pain management: pain level controlled Vital Signs Assessment: post-procedure vital signs reviewed and stable Respiratory status: spontaneous breathing, nonlabored ventilation and respiratory function stable Cardiovascular status: blood pressure returned to baseline Postop Assessment: no apparent nausea or vomiting Anesthetic complications: no    Last Vitals:  Vitals:   02/05/19 1330 02/05/19 1400  BP:  (!) 149/77  Pulse: 61 60  Resp: 15 16  Temp:  36.6 C  SpO2: 100% 98%    Last Pain:  Vitals:   02/05/19 1400  TempSrc:   PainSc: 0-No pain                 Brennan Bailey

## 2019-02-05 NOTE — H&P (Signed)
[name] is an 78 y.o. female.   Chief Complaint:numbness left hand HPI: [name] a 62 yo complaining of bilateral carpal tunnel syndromes.She continues complain of numbness tingling right greater than left with pain left greater than right this is aching in nature with a VAS score 8 over tone in the wrist going up her arm. On the left side. Numbness and tingling is in the median nerve distribution bilaterally. She has been wearing a brace but it can will still wake her up at night. He has no new history of injury. She states the numbness and tingling has been increasing over the past 3 to 4 months. She had an injection at her last visit which has given her good relief. He has had nerve conductions done by Dr. Lavell Anchors 2018 which revealed a delay is 6 2 on the right side for 6 on the left. The right sensory response showed no response. Her left had a sensory delay of 4.5. She is prediabetic has a history of thyroid problems no history of arthritis or gout. Family history is positive diabetes negative for the remainder.   Past Medical History:  Diagnosis Date  . Arthritis    RIGHT KNEE PAIN AND OA  . Chest pain 05/03/2015  . Elevated cholesterol   . GERD (gastroesophageal reflux disease)   . Hypertension   . Hypothyroidism   . IBS (irritable bowel syndrome)   . Rotator cuff disorder    BOTH SHOULDERS - NO SURGERY - AND STATES LIMITATIONS IN ARM MOVEMENT  . Shortness of breath 05/03/2015  . Swelling    CHRONIC SWELLING RIGHT HAND AND BOTH FEET AND BOTH LEGS  . Vertigo     Past Surgical History:  Procedure Laterality Date  . ABDOMINAL HYSTERECTOMY  1985  . BREAST SURGERY  1985   RIGHT BREAST BIOPSY - BENIGN  . CHOLECYSTECTOMY  1975  . EYE SURGERY  1995   BILATERAL CATARACT EXTRACTIONS; SURGERY FOR MACULAR HOLE   . [place]  . 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   Procedure: RIGHT TOTAL KNEE ARTHROPLASTY;  Surgeon: Gearlean Alf, MD;  Location: [facility];  Service:  Orthopedics;  Laterality: Right;    Family History  Problem Relation Age of Onset  . Diabetes Mother   . Dementia Mother   . Diabetes Father   . Diabetes Sister   . Breast cancer Sister   . Kidney cancer Sister   . Lung cancer Sister   . Lung cancer Maternal Aunt   . Pancreatic cancer Maternal Aunt    Social History:  reports that she has never smoked. She has never used smokeless tobacco. She reports current alcohol use. She reports that she does not use drugs.  Allergies:  Allergies  Allergen Reactions  . Codeine Nausea And Vomiting  . Macrobid [Nitrofurantoin Macrocrystal]     RASH  . Tramadol Nausea And Vomiting    No medications prior to admission.    Results for orders placed or performed during the hospital encounter of 02/03/19 (from the past 48 hour(s))  SARS Coronavirus 2 (Performed in [place] hospital lab)     Status: None   Collection Time: 02/03/19 11:01 AM   Specimen: Nasal Swab  Result Value Ref Range   SARS Coronavirus 2 NEGATIVE NEGATIVE    Comment: (NOTE) SARS-CoV-2 target nucleic acids are NOT DETECTED. The SARS-CoV-2 RNA is generally detectable in upper and lower respiratory specimens during the acute phase of infection. Negative results do not  preclude SARS-CoV-2 infection, do not rule out co-infections with other pathogens, and should not be used as the sole basis for treatment or other patient management decisions. Negative results must be combined with clinical observations, patient history, and epidemiological information. The expected result is Negative. Fact Sheet for Patients: SugarRoll.be Fact Sheet for Healthcare Providers: https://www.woods-mathews.com/ This test is not yet approved or cleared by the [place] FDA and  has been authorized for detection and/or diagnosis of SARS-CoV-2 by FDA under an Emergency Use Authorization (EUA). This EUA will remain  in effect (meaning this test can  be used) for the duration of the COVID-19 declaration under Section 56 4(b)(1) of the Act, 21 U.S.C. section 360bbb-3(b)(1), unless the authorization is terminated or revoked sooner. Performed at [facility], [place] [address], [place], [place] [address]     No results found.   Pertinent items are noted in HPI.  Height 5\' 3"  (1.6 m), weight 99.3 kg.  General appearance: alert, cooperative and appears stated age Head: Normocephalic, without obvious abnormality Neck: no JVD Resp: clear to auscultation bilaterally Cardio: regular rate and rhythm, S1, S2 normal, no murmur, click, rub or gallop GI: soft, non-tender; bowel sounds normal; no masses,  no organomegaly Extremities: numbness left hand Pulses: 2+ and symmetric Skin: Skin color, texture, turgor normal. No rashes or lesions Neurologic: Grossly normal Incision/Wound: na  Assessment/Plan Assessment:  1. Bilateral carpal tunnel syndrome    Plan: We have discussed possibility of surgical intervention with her. She would like to proceed to have the left carpal tunnel release. Pre-peri-and postoperative course been discussed along with risks and complications. She is aware that there is no guarantee to the surgery the possibility of infection recurrence injury to arteries nerves tendons incomplete relief of symptoms and dystrophy. Questions are encouraged and answered to her satisfaction. She is scheduled for left carpal tunnel release in outpatient under regional anesthesia.    Daryll Brod 02/05/2019, 9:35 AM

## 2019-02-05 NOTE — Transfer of Care (Signed)
Immediate Anesthesia Transfer of Care Note  Patient: [name]  Procedure(s) Performed: LEFT CARPAL TUNNEL RELEASE (Left Wrist)  Patient Location: PACU  Anesthesia Type:Bier block  Level of Consciousness: awake, alert  and oriented  Airway & Oxygen Therapy: Patient Spontanous Breathing and Patient connected to face mask oxygen  Post-op Assessment: Report given to RN and Post -op Vital signs reviewed and stable  Post vital signs: Reviewed and stable  Last Vitals:  Vitals Value Taken Time  BP    Temp    Pulse 71 02/05/19 1307  Resp 18 02/05/19 1307  SpO2 100 % 02/05/19 1307  Vitals shown include unvalidated device data.  Last Pain:  Vitals:   02/05/19 1020  TempSrc: Oral  PainSc: 0-No pain         Complications: No apparent anesthesia complications

## 2019-02-05 NOTE — Anesthesia Preprocedure Evaluation (Addendum)
Anesthesia Evaluation  Patient identified by MRN, date of birth, ID band Patient awake    Reviewed: Allergy & Precautions, H&P , NPO status , Patient's Chart, lab work & pertinent test results  Airway Mallampati: II  TM Distance: >3 FB Neck ROM: Full    Dental no notable dental hx.    Pulmonary neg pulmonary ROS,    Pulmonary exam normal breath sounds clear to auscultation       Cardiovascular hypertension, Pt. on medications negative cardio ROS Normal cardiovascular exam Rhythm:Regular Rate:Normal     Neuro/Psych negative neurological ROS  negative psych ROS   GI/Hepatic Neg liver ROS, GERD  Medicated,  Endo/Other  negative endocrine ROSHypothyroidism Morbid obesity  Renal/GU negative Renal ROS  negative genitourinary   Musculoskeletal  (+) Arthritis , Osteoarthritis,    Abdominal   Peds negative pediatric ROS (+)  Hematology negative hematology ROS (+)   Anesthesia Other Findings   Reproductive/Obstetrics negative OB ROS                            Anesthesia Physical  Anesthesia Plan  ASA: III  Anesthesia Plan: Bier Block and Bier Block-LIDOCAINE ONLY   Post-op Pain Management:    Induction:   PONV Risk Score and Plan: 2 and Ondansetron  Airway Management Planned: Simple Face Mask and Nasal Cannula  Additional Equipment:   Intra-op Plan:   Post-operative Plan:   Informed Consent: I have reviewed the patients History and Physical, chart, labs and discussed the procedure including the risks, benefits and alternatives for the proposed anesthesia with the patient or authorized representative who has indicated his/her understanding and acceptance.     Dental advisory given  Plan Discussed with: CRNA, Surgeon and Anesthesiologist  Anesthesia Plan Comments:        Anesthesia Quick Evaluation

## 2019-02-05 NOTE — Op Note (Signed)
NAME: [name] MEDICAL RECORD NO: [id number] DATE OF BIRTH: [birth year] FACILITY: [facility] LOCATION: [facility] PHYSICIAN: Wynonia Sours, MD   OPERATIVE REPORT   DATE OF PROCEDURE: 02/05/19    PREOPERATIVE DIAGNOSIS:   Carpal tunnel syndrome left   POSTOPERATIVE DIAGNOSIS:   Same   PROCEDURE:   Decompression median nerve left hand   SURGEON: Daryll Brod, M.D.   ASSISTANT: none   ANESTHESIA:  Bier block with sedation and Local   INTRAVENOUS FLUIDS:  Per anesthesia flow sheet.   ESTIMATED BLOOD LOSS:  Minimal.   COMPLICATIONS:  None.   SPECIMENS:  none   TOURNIQUET TIME:    Total Tourniquet Time Documented: Forearm (Left) - 21 minutes Total: Forearm (Left) - 21 minutes    DISPOSITION:  Stable to PACU.   INDICATIONS: Patient is a 78 year old female with a history of numbness and tingling to both hands.  Nerve conductions are positive for nerve compression of the median nerve bilaterally.  This is not responded to conservative treatment she is elected to undergo surgical decompression.  Pre-peri-and postoperative course been discussed along with risk complications.  She is aware there is no guarantee to the surgery the possibility of infection recurrence injury to arteries nerves tendons complete relief symptoms and dystrophy.  Preoperative area the patient is seen the extremity marked by both patient and surgeon antibiotic given  OPERATIVE COURSE: Patient is brought to the operating room where form based IV regional anesthetic was carried out without difficulty under the direction of the anesthesia department.  She was prepped using ChloraPrep in the supine position left arm free.  A three-minute dry time was allowed timeout taken to confirm patient procedure.  A longitudinal incision was made left palm carried down through subcutaneous tissue.  Bleeders were electrocauterized with bipolar.  Palmar fascia was split.  Superficial palmar arch identified.  The  flexor tendon to the ring little finger identified.  Retractors were placed retracting median nerve flexor tendons radially ulnar nerve ulnarly.  Flexor retinaculum was then released on its ulnar border.  A right angle and stool retractor were then positioned between the forearm fascia proximally.  Deep structures were dissected free with blunt dissection.  The flexor retinaculum proximal aspect distal forearm fascia was then released for approximately 2 cm proximal to the wrist crease under direct vision.  The canal was explored explored.  Tenosynovial tissue was moderately thickened.  Motor branch entered muscle distally.  A area of compression was immediately apparent with an hourglass deformity and significant hyperemia.  Wound was copiously irrigated with saline.  The skin was closed interrupted 4-0 nylon sutures.  A sterile compressive dressing and was applied after a local infiltration quarter percent bupivacaine without epinephrine wound was given to the wounds.  8 cc was used.  Patient tolerated procedure well and deflation of the tourniquet all fingers immediately pinked she was taken to the recovery room for observation in satisfactory condition.  She will be discharged home to return the [facility] on Tylenol ibuprofen with Celebrex as breakthrough and that she cannot take narcotics.   Daryll Brod, MD Electronically signed, 02/05/19

## 2019-02-05 NOTE — Brief Op Note (Signed)
02/05/2019  1:02 PM  PATIENT:  Meredith Pel  78 y.o. female  PRE-OPERATIVE DIAGNOSIS:  LEFT CARPAL TUNNEL SYNDROME  POST-OPERATIVE DIAGNOSIS:  LEFT CARPAL TUNNEL SYNDROME  PROCEDURE:  Procedure(s): LEFT CARPAL TUNNEL RELEASE (Left)  SURGEON:  Surgeon(s) and Role:    * Daryll Brod, MD - Primary  PHYSICIAN ASSISTANT:   ASSISTANTS: none   ANESTHESIA:   local, regional and IV sedation  EBL:  3 mL   BLOOD ADMINISTERED:none  DRAINS: none  LOCAL MEDICATIONS USED:  BUPIVICAINE   SPECIMEN:  No Specimen  DISPOSITION OF SPECIMEN:  N/A  COUNTS:  YES  TOURNIQUET:   Total Tourniquet Time Documented: Forearm (Left) - 21 minutes Total: Forearm (Left) - 21 minutes   DICTATION: .Viviann Spare Dictation  PLAN OF CARE: Discharge to home after PACU  PATIENT DISPOSITION:  PACU - hemodynamically stable.

## 2019-02-06 ENCOUNTER — Encounter (HOSPITAL_BASED_OUTPATIENT_CLINIC_OR_DEPARTMENT_OTHER): Payer: Self-pay | Admitting: Orthopedic Surgery

## 2019-02-06 NOTE — Addendum Note (Signed)
Addendum  created 02/06/19 1157 by Tawni Millers, CRNA   Charge Capture section accepted

## 2019-02-24 ENCOUNTER — Ambulatory Visit (INDEPENDENT_AMBULATORY_CARE_PROVIDER_SITE_OTHER): Payer: Medicare Other | Admitting: Internal Medicine

## 2019-02-24 ENCOUNTER — Encounter: Payer: Self-pay | Admitting: Internal Medicine

## 2019-02-24 ENCOUNTER — Other Ambulatory Visit: Payer: Self-pay

## 2019-02-24 VITALS — BP 130/84 | HR 94 | Temp 98.1°F | Ht 63.0 in | Wt 224.8 lb

## 2019-02-24 DIAGNOSIS — G5602 Carpal tunnel syndrome, left upper limb: Secondary | ICD-10-CM

## 2019-02-24 DIAGNOSIS — I10 Essential (primary) hypertension: Secondary | ICD-10-CM | POA: Diagnosis not present

## 2019-02-24 DIAGNOSIS — Z6839 Body mass index (BMI) 39.0-39.9, adult: Secondary | ICD-10-CM

## 2019-02-24 DIAGNOSIS — H6121 Impacted cerumen, right ear: Secondary | ICD-10-CM | POA: Diagnosis not present

## 2019-02-24 DIAGNOSIS — E039 Hypothyroidism, unspecified: Secondary | ICD-10-CM

## 2019-02-24 DIAGNOSIS — R7309 Other abnormal glucose: Secondary | ICD-10-CM | POA: Diagnosis not present

## 2019-02-24 DIAGNOSIS — M1712 Unilateral primary osteoarthritis, left knee: Secondary | ICD-10-CM

## 2019-02-24 NOTE — Patient Instructions (Addendum)
DRINK ONE CUP OF HOT WATER W/ JUICE FROM 1/2 LEMON EVERY MORNING  INCREASE INTAKE OF CRUCIFEROUS VEGGIES - BROCCOLI, CAULIFLOWER, CABBAGE AND/OR BRUSSEL SPROUTS   LOOK INTO DRY BRUSHING FOR LOWER EXTREMITY EDEMA     Exercises To Do While Sitting  Exercises that you do while sitting (chair exercises) can give you many of the same benefits as full exercise. Benefits include strengthening your heart, burning calories, and keeping muscles and joints healthy. Exercise can also improve your mood and help with depression and anxiety. You may benefit from chair exercises if you are unable to do standing exercises because of:  Diabetic foot pain.  Obesity.  Illness.  Arthritis.  Recovery from surgery or injury.  Breathing problems.  Balance problems.  Another type of disability. Before starting chair exercises, check with your health care provider or a physical therapist to find out how much exercise you can tolerate and which exercises are safe for you. If your health care provider approves:  Start out slowly and build up over time. Aim to work up to about 10-20 minutes for each exercise session.  Make exercise part of your daily routine.  Drink water when you exercise. Do not wait until you are thirsty. Drink every 10-15 minutes.  Stop exercising right away if you have pain, nausea, shortness of breath, or dizziness.  If you are exercising in a wheelchair, make sure to lock the wheels.  Ask your health care provider whether you can do tai chi or yoga. Many positions in these mind-body exercises can be modified to do while seated. Warm-up Before starting other exercises: 1. Sit up as straight as you can. Have your knees bent at 90 degrees, which is the shape of the capital letter "L." Keep your feet flat on the floor. 2. Sit at the front edge of your chair, if you can. 3. Pull in (tighten) the muscles in your abdomen and stretch your spine and neck as straight as you can.  Hold this position for a few minutes. 4. Breathe in and out evenly. Try to concentrate on your breathing, and relax your mind. Stretching Exercise A: Arm stretch 1. Hold your arms out straight in front of your body. 2. Bend your hands at the wrist with your fingers pointing up, as if signaling someone to stop. Notice the slight tension in your forearms as you hold the position. 3. Keeping your arms out and your hands bent, rotate your hands outward as far as you can and hold this stretch. Aim to have your thumbs pointing up and your pinkie fingers pointing down. Slowly repeat arm stretches for one minute as tolerated. Exercise B: Leg stretch 1. If you can move your legs, try to "draw" letters on the floor with the toes of your foot. Write your name with one foot. 2. Write your name with the toes of your other foot. Slowly repeat the movements for one minute as tolerated. Exercise C: Reach for the sky 1. Reach your hands as far over your head as you can to stretch your spine. 2. Move your hands and arms as if you are climbing a rope. Slowly repeat the movements for one minute as tolerated. Range of motion exercises Exercise A: Shoulder roll 1. Let your arms hang loosely at your sides. 2. Lift just your shoulders up toward your ears, then let them relax back down. 3. When your shoulders feel loose, rotate your shoulders in backward and forward circles. Do shoulder rolls slowly for one minute  as tolerated. Exercise B: March in place 1. As if you are marching, pump your arms and lift your legs up and down. Lift your knees as high as you can. ? If you are unable to lift your knees, just pump your arms and move your ankles and feet up and down. March in place for one minute as tolerated. Exercise C: Seated jumping jacks 1. Let your arms hang down straight. 2. Keeping your arms straight, lift them up over your head. Aim to point your fingers to the ceiling. 3. While you lift your arms,  straighten your legs and slide your heels along the floor to your sides, as wide as you can. 4. As you bring your arms back down to your sides, slide your legs back together. ? If you are unable to use your legs, just move your arms. Slowly repeat seated jumping jacks for one minute as tolerated. Strengthening exercises Exercise A: Shoulder squeeze 1. Hold your arms straight out from your body to your sides, with your elbows bent and your fists pointed at the ceiling. 2. Keeping your arms in the bent position, move them forward so your elbows and forearms meet in front of your face. 3. Open your arms back out as wide as you can with your elbows still bent, until you feel your shoulder blades squeezing together. Hold for 5 seconds. Slowly repeat the movements forward and backward for one minute as tolerated. Contact a health care provider if you:  Had to stop exercising due to any of the following: ? Pain. ? Nausea. ? Shortness of breath. ? Dizziness. ? Fatigue.  Have significant pain or soreness after exercising. Get help right away if you have:  Chest pain.  Difficulty breathing. These symptoms may represent a serious problem that is an emergency. Do not wait to see if the symptoms will go away. Get medical help right away. Call your local emergency services (911 in the [place] Do not drive yourself to the hospital. This information is not intended to replace advice given to you by your health care provider. Make sure you discuss any questions you have with your health care provider. Document Released: 05/08/2017 Document Revised: 10/16/2018 Document Reviewed: 05/08/2017 Elsevier Patient Education  2020 [employer].

## 2019-02-25 LAB — TSH: TSH: 1.18 u[IU]/mL (ref 0.450–4.500)

## 2019-02-25 LAB — HEMOGLOBIN A1C
Est. average glucose Bld gHb Est-mCnc: 137 mg/dL
Hgb A1c MFr Bld: 6.4 % — ABNORMAL HIGH (ref 4.8–5.6)

## 2019-03-01 NOTE — Progress Notes (Signed)
Subjective:     Patient ID: [name] , female    DOB: 05/16/1941 , 78 y.o.   MRN: [id number]   Chief Complaint  Patient presents with  . Hypertension  . Hypothyroidism    HPI  Hypertension This is a chronic problem. The current episode started more than 1 year ago. The problem has been gradually improving since onset. The problem is controlled. Pertinent negatives include no blurred vision, chest pain, palpitations or shortness of breath. Agents associated with hypertension include thyroid hormones. Risk factors for coronary artery disease include obesity, post-menopausal state and sedentary lifestyle. Past treatments include angiotensin blockers. The current treatment provides moderate improvement. Compliance problems include exercise.      Past Medical History:  Diagnosis Date  . Arthritis    RIGHT KNEE PAIN AND OA  . Chest pain 05/03/2015  . Elevated cholesterol   . GERD (gastroesophageal reflux disease)   . Hypertension   . Hypothyroidism   . IBS (irritable bowel syndrome)   . Rotator cuff disorder    BOTH SHOULDERS - NO SURGERY - AND STATES LIMITATIONS IN ARM MOVEMENT  . Shortness of breath 05/03/2015  . Swelling    CHRONIC SWELLING RIGHT HAND AND BOTH FEET AND BOTH LEGS  . Vertigo      Family History  Problem Relation Age of Onset  . Diabetes Mother   . Dementia Mother   . Diabetes Father   . Diabetes Sister   . Breast cancer Sister   . Kidney cancer Sister   . Lung cancer Sister   . Lung cancer Maternal Aunt   . Pancreatic cancer Maternal Aunt      Current Outpatient Medications:  .  Aspirin (ECOTRIN PO), Take 80 mg by mouth daily., Disp: , Rfl:  .  BENICAR 20 MG tablet, TAKE 1 TABLET BY MOUTH  DAILY, Disp: 90 tablet, Rfl: 2 .  bimatoprost (LUMIGAN) 0.01 % SOLN, Place 1 drop into both eyes at bedtime. , Disp: , Rfl:  .  famotidine (PEPCID) 20 MG tablet, Take 20 mg by mouth daily., Disp: , Rfl:  .  furosemide (LASIX) 20 MG tablet, Take 1 tablet (20 mg  total) by mouth every morning., Disp: 90 tablet, Rfl: 1 .  Magnesium 400 MG CAPS, Take 400 mg by mouth daily after supper., Disp: 90 capsule, Rfl: 1 .  Multiple Vitamins-Minerals (CENTRUM SILVER PO), Take 1 tablet by mouth daily. , Disp: , Rfl:  .  Multiple Vitamins-Minerals (EMERGEN-C VITAMIN C PO), Take 1 Package by mouth daily. Uses during the winter, Disp: , Rfl:  .  olopatadine (PATANOL) 0.1 % ophthalmic solution, Place 1 drop into both eyes daily. , Disp: , Rfl:  .  polyethylene glycol (MIRALAX / GLYCOLAX) packet, Take 17 g by mouth daily., Disp: , Rfl:  .  Polyvinyl Alcohol-Povidone (REFRESH OP), Place 1 drop into both eyes daily. , Disp: , Rfl:  .  SYNTHROID 75 MCG tablet, Take 1 tablet (75 mcg total) by mouth daily before breakfast. DAW1, please fill Brand name Synthroid Take 1 tablet by mouth Monday - Saturday, Disp: 30 tablet, Rfl: 3 .  vitamin B-6 (PYRIDOXINE) 25 MG tablet, Take 25 mg by mouth daily., Disp: , Rfl:  .  celecoxib (CELEBREX) 200 MG capsule, Take 1 capsule (200 mg total) by mouth 2 (two) times daily. (Patient not taking: Reported on 02/24/2019), Disp: 30 capsule, Rfl: 0 .  CRESTOR 10 MG tablet, Take 1 tablet (10 mg total) by mouth daily., Disp: 90 tablet,  Rfl: 0 .  Lifitegrast (XIIDRA) 5 % SOLN, Apply to eye. xiidra, Disp: , Rfl:    Allergies  Allergen Reactions  . Codeine Nausea And Vomiting  . Macrobid [Nitrofurantoin Macrocrystal]     RASH  . Tramadol Nausea And Vomiting     Review of Systems  Constitutional: Negative.   Eyes: Negative for blurred vision.  Respiratory: Negative.  Negative for shortness of breath.   Cardiovascular: Negative.  Negative for chest pain and palpitations.  Gastrointestinal: Negative.   Musculoskeletal: Positive for arthralgias.       She c/o r knee pain. There is pain with ambulation. Difficult to exercise due to knee pain  Neurological: Negative.   Psychiatric/Behavioral: Negative.      Today's Vitals   02/24/19 1130  BP:  130/84  Pulse: 94  Temp: 98.1 F (36.7 C)  TempSrc: Oral  Weight: 224 lb 12.8 oz (102 kg)  Height: 5\' 3"  (1.6 m)  PainSc: 0-No pain   Body mass index is 39.82 kg/m.   Objective:  Physical Exam Vitals signs and nursing note reviewed.  Constitutional:      Appearance: Normal appearance. She is obese.  HENT:     Head: Normocephalic and atraumatic.     Right Ear: Ear canal and external ear normal. There is impacted cerumen.     Left Ear: Tympanic membrane, ear canal and external ear normal.     Ears:     Comments: Right ear w/ hard, dry cerumen. TM not visualized Cardiovascular:     Rate and Rhythm: Normal rate and regular rhythm.     Heart sounds: Normal heart sounds.  Pulmonary:     Effort: Pulmonary effort is normal.     Breath sounds: Normal breath sounds.  Skin:    General: Skin is warm.  Neurological:     General: No focal deficit present.     Mental Status: She is alert.  Psychiatric:        Mood and Affect: Mood normal.        Behavior: Behavior normal.         Assessment And Plan:     1. Essential hypertension  Chronic, fair control. She will continue with current meds. She is encouraged to incorporate more exercise into her daily routine.   2. Hypothyroidism, unspecified type  I will check thyroid panel and adjust meds as needed.  - TSH  3. Other abnormal glucose  HER A1C HAS BEEN ELEVATED IN THE PAST. I WILL CHECK AN A1C, BMET TODAY. SHE WAS ENCOURAGED TO AVOID SUGARY BEVERAGES AND PROCESSED FOODS INCLUDNG BREADS, RICE AND PASTA.  - Hemoglobin A1c  4. Carpal tunnel syndrome of left wrist  Chronic, s/p recent surgical repair.   5. Primary osteoarthritis of left knee  Chronic, unfortunately this has impacted her ability to exercise. She is advised to consider topical pain cream, I.e. Voltaren gel. She is also encouraged to follow an anti-inflammatory diet.   6. Right ear impacted cerumen  AFTER OBTAINING VERBAL CONSENT, RIGHT EAR WAS FLUSHED  BY IRRIGATION. SHE TOLERATED PROCEDURE WELL WITHOUT ANY COMPLICATIONS. NO TM ABNORMALITIES WERE NOTED.  - Ear Lavage  7. Class 2 severe obesity due to excess calories with serious comorbidity and body mass index (BMI) of 39.0 to 39.9 in adult [facility])  Importance of achieving optimal weight to decrease risk of cardiovascular disease and cancers was discussed with the patient in full detail. She is encouraged to start slowly - start with 10 minutes twice daily at  least three to four days per week and to gradually build to 30 minutes five days weekly. She was given tips to incorporate more activity into her daily routine - take stairs when possible, park farther away from her job, grocery stores, etc.    [name], MD    THE PATIENT IS ENCOURAGED TO PRACTICE SOCIAL DISTANCING DUE TO THE COVID-19 PANDEMIC.

## 2019-03-12 ENCOUNTER — Other Ambulatory Visit: Payer: Self-pay | Admitting: Internal Medicine

## 2019-04-22 ENCOUNTER — Other Ambulatory Visit: Payer: Self-pay

## 2019-04-22 MED ORDER — ROSUVASTATIN CALCIUM 10 MG PO TABS: 10.0000 mg | ORAL_TABLET | Freq: Every day | ORAL | 1 refills | Status: DC

## 2019-05-07 ENCOUNTER — Encounter: Payer: Medicare Other | Admitting: Internal Medicine

## 2019-05-07 ENCOUNTER — Ambulatory Visit: Payer: Medicare Other

## 2019-05-11 ENCOUNTER — Other Ambulatory Visit: Payer: Self-pay

## 2019-05-11 MED ORDER — MAGNESIUM 400 MG PO CAPS: 400.0000 mg | ORAL_CAPSULE | Freq: Every day | ORAL | 1 refills | Status: DC

## 2019-05-28 ENCOUNTER — Other Ambulatory Visit: Payer: Self-pay | Admitting: Internal Medicine

## 2019-06-10 ENCOUNTER — Other Ambulatory Visit: Payer: Self-pay | Admitting: Internal Medicine

## 2019-06-23 ENCOUNTER — Other Ambulatory Visit: Payer: Self-pay

## 2019-06-23 ENCOUNTER — Ambulatory Visit (INDEPENDENT_AMBULATORY_CARE_PROVIDER_SITE_OTHER): Payer: Medicare Other | Admitting: Internal Medicine

## 2019-06-23 ENCOUNTER — Encounter: Payer: Self-pay | Admitting: Internal Medicine

## 2019-06-23 VITALS — BP 128/76 | HR 82 | Temp 97.8°F | Ht 63.0 in | Wt 221.8 lb

## 2019-06-23 DIAGNOSIS — H6123 Impacted cerumen, bilateral: Secondary | ICD-10-CM | POA: Diagnosis not present

## 2019-06-23 DIAGNOSIS — I10 Essential (primary) hypertension: Secondary | ICD-10-CM

## 2019-06-23 DIAGNOSIS — R4589 Other symptoms and signs involving emotional state: Secondary | ICD-10-CM

## 2019-06-23 DIAGNOSIS — R413 Other amnesia: Secondary | ICD-10-CM | POA: Diagnosis not present

## 2019-06-23 DIAGNOSIS — Z0000 Encounter for general adult medical examination without abnormal findings: Secondary | ICD-10-CM

## 2019-06-23 DIAGNOSIS — Z6839 Body mass index (BMI) 39.0-39.9, adult: Secondary | ICD-10-CM

## 2019-06-23 DIAGNOSIS — E1165 Type 2 diabetes mellitus with hyperglycemia: Secondary | ICD-10-CM | POA: Diagnosis not present

## 2019-06-23 DIAGNOSIS — E039 Hypothyroidism, unspecified: Secondary | ICD-10-CM

## 2019-06-23 LAB — POCT URINALYSIS DIPSTICK
Bilirubin, UA: NEGATIVE
Glucose, UA: NEGATIVE
Ketones, UA: NEGATIVE
Nitrite, UA: NEGATIVE
Protein, UA: NEGATIVE
Spec Grav, UA: >=1.03 — AB (ref 1.010–1.025)
Urobilinogen, UA: 0.2 E.U./dL
pH, UA: 5.5 (ref 5.0–8.0)

## 2019-06-23 LAB — POCT UA - MICROALBUMIN
Albumin/Creatinine Ratio, Urine, POC: 30
Creatinine, POC: 200 mg/dL
Microalbumin Ur, POC: 30 mg/L

## 2019-06-23 NOTE — Patient Instructions (Signed)
Health Maintenance, Female Adopting a healthy lifestyle and getting preventive care are important in promoting health and wellness. Ask your health care provider about:  The right schedule for you to have regular tests and exams.  Things you can do on your own to prevent diseases and keep yourself healthy. What should I know about diet, weight, and exercise? Eat a healthy diet   Eat a diet that includes plenty of vegetables, fruits, low-fat dairy products, and lean protein.  Do not eat a lot of foods that are high in solid fats, added sugars, or sodium. Maintain a healthy weight Body mass index (BMI) is used to identify weight problems. It estimates body fat based on height and weight. Your health care provider can help determine your BMI and help you achieve or maintain a healthy weight. Get regular exercise Get regular exercise. This is one of the most important things you can do for your health. Most adults should:  Exercise for at least 150 minutes each week. The exercise should increase your heart rate and make you sweat (moderate-intensity exercise).  Do strengthening exercises at least twice a week. This is in addition to the moderate-intensity exercise.  Spend less time sitting. Even light physical activity can be beneficial. Watch cholesterol and blood lipids Have your blood tested for lipids and cholesterol at 80 years of age, then have this test every 5 years. Have your cholesterol levels checked more often if:  Your lipid or cholesterol levels are high.  You are older than 80 years of age.  You are at high risk for heart disease. What should I know about cancer screening? Depending on your health history and family history, you may need to have cancer screening at various ages. This may include screening for:  Breast cancer.  Cervical cancer.  Colorectal cancer.  Skin cancer.  Lung cancer. What should I know about heart disease, diabetes, and high blood  pressure? Blood pressure and heart disease  High blood pressure causes heart disease and increases the risk of stroke. This is more likely to develop in people who have high blood pressure readings, are of African descent, or are overweight.  Have your blood pressure checked: ? Every 3-5 years if you are 18-39 years of age. ? Every year if you are 40 years old or older. Diabetes Have regular diabetes screenings. This checks your fasting blood sugar level. Have the screening done:  Once every three years after age 40 if you are at a normal weight and have a low risk for diabetes.  More often and at a younger age if you are overweight or have a high risk for diabetes. What should I know about preventing infection? Hepatitis B If you have a higher risk for hepatitis B, you should be screened for this virus. Talk with your health care provider to find out if you are at risk for hepatitis B infection. Hepatitis C Testing is recommended for:  Everyone born from 1945 through 1965.  Anyone with known risk factors for hepatitis C. Sexually transmitted infections (STIs)  Get screened for STIs, including gonorrhea and chlamydia, if: ? You are sexually active and are younger than 80 years of age. ? You are older than 80 years of age and your health care provider tells you that you are at risk for this type of infection. ? Your sexual activity has changed since you were last screened, and you are at increased risk for chlamydia or gonorrhea. Ask your health care provider if   you are at risk.  Ask your health care provider about whether you are at high risk for HIV. Your health care provider may recommend a prescription medicine to help prevent HIV infection. If you choose to take medicine to prevent HIV, you should first get tested for HIV. You should then be tested every 3 months for as long as you are taking the medicine. Pregnancy  If you are about to stop having your period (premenopausal) and  you may become pregnant, seek counseling before you get pregnant.  Take 400 to 800 micrograms (mcg) of folic acid every day if you become pregnant.  Ask for birth control (contraception) if you want to prevent pregnancy. Osteoporosis and menopause Osteoporosis is a disease in which the bones lose minerals and strength with aging. This can result in bone fractures. If you are 65 years old or older, or if you are at risk for osteoporosis and fractures, ask your health care provider if you should:  Be screened for bone loss.  Take a calcium or vitamin D supplement to lower your risk of fractures.  Be given hormone replacement therapy (HRT) to treat symptoms of menopause. Follow these instructions at home: Lifestyle  Do not use any products that contain nicotine or tobacco, such as cigarettes, e-cigarettes, and chewing tobacco. If you need help quitting, ask your health care provider.  Do not use street drugs.  Do not share needles.  Ask your health care provider for help if you need support or information about quitting drugs. Alcohol use  Do not drink alcohol if: ? Your health care provider tells you not to drink. ? You are pregnant, may be pregnant, or are planning to become pregnant.  If you drink alcohol: ? Limit how much you use to 0-1 drink a day. ? Limit intake if you are breastfeeding.  Be aware of how much alcohol is in your drink. In the [place] one drink equals one 12 oz bottle of beer (355 mL), one 5 oz glass of wine (148 mL), or one 1 oz glass of hard liquor (44 mL). General instructions  Schedule regular health, dental, and eye exams.  Stay current with your vaccines.  Tell your health care provider if: ? You often feel depressed. ? You have ever been abused or do not feel safe at home. Summary  Adopting a healthy lifestyle and getting preventive care are important in promoting health and wellness.  Follow your health care provider's instructions about healthy  diet, exercising, and getting tested or screened for diseases.  Follow your health care provider's instructions on monitoring your cholesterol and blood pressure. This information is not intended to replace advice given to you by your health care provider. Make sure you discuss any questions you have with your health care provider. Document Released: 01/08/2011 Document Revised: 06/18/2018 Document Reviewed: 06/18/2018 Elsevier Patient Education  2020 [employer]  

## 2019-06-24 LAB — CMP14+EGFR
ALT: 26 IU/L (ref 0–32)
AST: 20 IU/L (ref 0–40)
Albumin/Globulin Ratio: 2 (ref 1.2–2.2)
Albumin: 4.3 g/dL (ref 3.7–4.7)
Alkaline Phosphatase: 113 IU/L (ref 39–117)
BUN/Creatinine Ratio: 35 — ABNORMAL HIGH (ref 12–28)
BUN: 19 mg/dL (ref 8–27)
Bilirubin Total: 0.4 mg/dL (ref 0.0–1.2)
CO2: 22 mmol/L (ref 20–29)
Calcium: 11 mg/dL — ABNORMAL HIGH (ref 8.7–10.3)
Chloride: 104 mmol/L (ref 96–106)
Creatinine, Ser: 0.55 mg/dL — ABNORMAL LOW (ref 0.57–1.00)
GFR calc Af Amer: 102 mL/min/{1.73_m2} (ref >59)
GFR calc non Af Amer: 89 mL/min/{1.73_m2} (ref >59)
Globulin, Total: 2.1 g/dL (ref 1.5–4.5)
Glucose: 103 mg/dL — ABNORMAL HIGH (ref 65–99)
Potassium: 4.1 mmol/L (ref 3.5–5.2)
Sodium: 143 mmol/L (ref 134–144)
Total Protein: 6.4 g/dL (ref 6.0–8.5)

## 2019-06-24 LAB — T4, FREE: Free T4: 1.48 ng/dL (ref 0.82–1.77)

## 2019-06-24 LAB — CBC
Hematocrit: 46.5 % (ref 34.0–46.6)
Hemoglobin: 15.4 g/dL (ref 11.1–15.9)
MCH: 30.1 pg (ref 26.6–33.0)
MCHC: 33.1 g/dL (ref 31.5–35.7)
MCV: 91 fL (ref 79–97)
Platelets: 180 10*3/uL (ref 150–450)
RBC: 5.12 x10E6/uL (ref 3.77–5.28)
RDW: 13.1 % (ref 11.7–15.4)
WBC: 5.5 10*3/uL (ref 3.4–10.8)

## 2019-06-24 LAB — RPR: RPR Ser Ql: NONREACTIVE

## 2019-06-24 LAB — HEMOGLOBIN A1C
Est. average glucose Bld gHb Est-mCnc: 140 mg/dL
Hgb A1c MFr Bld: 6.5 % — ABNORMAL HIGH (ref 4.8–5.6)

## 2019-06-24 LAB — VITAMIN B12: Vitamin B-12: 722 pg/mL (ref 232–1245)

## 2019-06-24 LAB — TSH: TSH: 0.782 u[IU]/mL (ref 0.450–4.500)

## 2019-07-06 ENCOUNTER — Other Ambulatory Visit: Payer: Medicare Other

## 2019-07-06 ENCOUNTER — Other Ambulatory Visit: Payer: Self-pay

## 2019-07-07 LAB — PTH, INTACT AND CALCIUM
Calcium: 10.7 mg/dL — ABNORMAL HIGH (ref 8.7–10.3)
PTH: 75 pg/mL — ABNORMAL HIGH (ref 15–65)

## 2019-07-07 NOTE — Progress Notes (Signed)
This visit occurred during the SARS-CoV-2 public health emergency.  Safety protocols were in place, including screening questions prior to the visit, additional usage of staff PPE, and extensive cleaning of exam room while observing appropriate contact time as indicated for disinfecting solutions.  Subjective:     Patient ID: [name] , female    DOB: 1940/09/24 , 78 y.o.   MRN: [id number]   Chief Complaint  Patient presents with  . Annual Exam  . Hypertension    HPI  She is here today for a full physical examination. She has a number of concerns she wants to discuss today.   Hypertension This is a chronic problem. The current episode started more than 1 year ago. The problem has been gradually improving since onset. The problem is controlled. Pertinent negatives include no blurred vision or palpitations. Risk factors for coronary artery disease include dyslipidemia, obesity, post-menopausal state and sedentary lifestyle. The current treatment provides moderate improvement. Compliance problems include exercise.      Past Medical History:  Diagnosis Date  . Arthritis    RIGHT KNEE PAIN AND OA  . Chest pain 05/03/2015  . Elevated cholesterol   . GERD (gastroesophageal reflux disease)   . Hypertension   . Hypothyroidism   . IBS (irritable bowel syndrome)   . Rotator cuff disorder    BOTH SHOULDERS - NO SURGERY - AND STATES LIMITATIONS IN ARM MOVEMENT  . Shortness of breath 05/03/2015  . Swelling    CHRONIC SWELLING RIGHT HAND AND BOTH FEET AND BOTH LEGS  . Vertigo      Family History  Problem Relation Age of Onset  . Diabetes Mother   . Dementia Mother   . Diabetes Father   . Diabetes Sister   . Breast cancer Sister   . Kidney cancer Sister   . Lung cancer Sister   . Lung cancer Maternal Aunt   . Pancreatic cancer Maternal Aunt      Current Outpatient Medications:  .  Aspirin (ECOTRIN PO), Take 80 mg by mouth daily., Disp: , Rfl:  .  BENICAR 20 MG tablet, TAKE  1 TABLET BY MOUTH  DAILY, Disp: 90 tablet, Rfl: 2 .  bimatoprost (LUMIGAN) 0.01 % SOLN, Place 1 drop into both eyes at bedtime. , Disp: , Rfl:  .  famotidine (PEPCID) 20 MG tablet, Take 20 mg by mouth daily., Disp: , Rfl:  .  furosemide (LASIX) 20 MG tablet, TAKE 1 TABLET BY MOUTH  EVERY MORNING, Disp: 90 tablet, Rfl: 1 .  Lifitegrast (XIIDRA) 5 % SOLN, Apply to eye. xiidra, Disp: , Rfl:  .  Magnesium 400 MG CAPS, Take 400 mg by mouth daily after supper., Disp: 90 capsule, Rfl: 1 .  Multiple Vitamins-Minerals (CENTRUM SILVER PO), Take 1 tablet by mouth daily. , Disp: , Rfl:  .  Multiple Vitamins-Minerals (EMERGEN-C VITAMIN C PO), Take 1 Package by mouth daily. Uses during the winter, Disp: , Rfl:  .  olopatadine (PATANOL) 0.1 % ophthalmic solution, Place 1 drop into both eyes daily. , Disp: , Rfl:  .  polyethylene glycol (MIRALAX / GLYCOLAX) packet, Take 17 g by mouth daily., Disp: , Rfl:  .  rosuvastatin (CRESTOR) 10 MG tablet, Take 1 tablet (10 mg total) by mouth daily., Disp: 90 tablet, Rfl: 1 .  SYNTHROID 75 MCG tablet, TAKE 1 TABLET BY MOUTH  DAILY BEFORE BREAKFAST, Disp: 90 tablet, Rfl: 1 .  vitamin B-6 (PYRIDOXINE) 25 MG tablet, Take 25 mg by mouth daily., Disp: ,  Rfl:    Allergies  Allergen Reactions  . Codeine Nausea And Vomiting  . Macrobid [Nitrofurantoin Macrocrystal]     RASH  . Tramadol Nausea And Vomiting     The patient states she uses post menopausal status for birth control. Last LMP was No LMP recorded. Patient is postmenopausal.. Negative for Dysmenorrhea Negative for: breast discharge, breast lump(s), breast pain and breast self exam. Associated symptoms include abnormal vaginal bleeding. Pertinent negatives include abnormal bleeding (hematology), anxiety, decreased libido, depression, difficulty falling sleep, dyspareunia, history of infertility, nocturia, sexual dysfunction, sleep disturbances, urinary incontinence, urinary urgency, vaginal discharge and vaginal itching.  Diet regular.The patient states her exercise level is  intermittent.   . The patient's tobacco use is:  Social History   Tobacco Use  Smoking Status Never Smoker  Smokeless Tobacco Never Used  . She has been exposed to passive smoke. The patient's alcohol use is:  Social History   Substance and Sexual Activity  Alcohol Use Yes   Comment: OCCAS ALCOHOL    Review of Systems  Constitutional: Negative.   HENT: Negative.   Eyes: Negative.  Negative for blurred vision.  Respiratory: Negative.   Cardiovascular: Negative.  Negative for palpitations.  Gastrointestinal: Negative.   Endocrine: Negative.   Genitourinary: Negative.   Musculoskeletal: Negative.   Skin: Negative.   Allergic/Immunologic: Negative.   Neurological: Negative.        She c/o memory issues. She reports being more forgetful. She is not sure how long this has been going on. Unable to identify a triggering factor.   Hematological: Negative.   Psychiatric/Behavioral: Positive for dysphoric mood.     Today's Vitals   06/23/19 1025  BP: 128/76  Pulse: 82  Temp: 97.8 F (36.6 C)  TempSrc: Oral  Weight: 221 lb 12.8 oz (100.6 kg)  Height: '5\' 3"'$  (1.6 m)   Body mass index is 39.29 kg/m.   Objective:  Physical Exam Vitals and nursing note reviewed.  Constitutional:      Appearance: Normal appearance. She is obese.  HENT:     Head: Normocephalic and atraumatic.     Right Ear: Ear canal and external ear normal. There is impacted cerumen.     Left Ear: Ear canal and external ear normal. There is impacted cerumen.     Nose:     Comments: Deferred, masked    Mouth/Throat:     Comments: Deferred, masked Eyes:     Extraocular Movements: Extraocular movements intact.     Conjunctiva/sclera: Conjunctivae normal.     Pupils: Pupils are equal, round, and reactive to light.  Cardiovascular:     Rate and Rhythm: Normal rate and regular rhythm.     Pulses: Normal pulses.          Dorsalis pedis pulses are 2+ on  the right side and 2+ on the left side.     Heart sounds: Normal heart sounds.  Pulmonary:     Effort: Pulmonary effort is normal.     Breath sounds: Normal breath sounds.  Abdominal:     General: Bowel sounds are normal.     Palpations: Abdomen is soft.     Comments: Obese, difficult to assess organomegaly.   Genitourinary:    Comments: deferred Musculoskeletal:        General: Normal range of motion.     Cervical back: Normal range of motion and neck supple.     Right lower leg: 1+ Pitting Edema present.     Left lower leg:  1+ Pitting Edema present.     Comments: Healed scar on right knee  Feet:     Right foot:     Protective Sensation: 5 sites tested. 5 sites sensed.     Skin integrity: Skin integrity normal.     Toenail Condition: Right toenails are normal.     Left foot:     Protective Sensation: 5 sites tested. 5 sites sensed.     Skin integrity: Skin integrity normal.     Toenail Condition: Left toenails are normal.  Skin:    General: Skin is warm and dry.  Neurological:     General: No focal deficit present.     Mental Status: She is alert and oriented to person, place, and time.  Psychiatric:        Mood and Affect: Mood normal.        Behavior: Behavior normal.         Assessment And Plan:     1. Routine general medical examination at health care facility  A full exam was performed.  Importance of monthly self breast exams was discussed with the patient. PATIENT HAS BEEN ADVISED TO GET 30-45 MINUTES REGULAR EXERCISE NO LESS THAN FOUR TO FIVE DAYS PER WEEK - BOTH WEIGHTBEARING EXERCISES AND AEROBIC ARE RECOMMENDED.  HE IS ADVISED TO FOLLOW A HEALTHY DIET WITH AT LEAST SIX FRUITS/VEGGIES PER DAY, DECREASE INTAKE OF RED MEAT, AND TO INCREASE FISH INTAKE TO TWO DAYS PER WEEK.  MEATS/FISH SHOULD NOT BE FRIED, BAKED OR BROILED IS PREFERABLE.  I SUGGEST WEARING SPF 50 SUNSCREEN ON EXPOSED PARTS AND ESPECIALLY WHEN IN THE DIRECT SUNLIGHT FOR AN EXTENDED PERIOD OF TIME.   PLEASE AVOID FAST FOOD RESTAURANTS AND INCREASE YOUR WATER INTAKE.   2. Essential hypertension  Chronic, well controlled.  She will continue with current meds. She is encouraged to avoid adding salt to her foods. EKG not performed. She will rto in six months for re-evaluation.   - POCT Urinalysis Dipstick (81002) - POCT UA - Microalbumin - CMP14+EGFR - CBC  3. Uncontrolled type 2 diabetes mellitus with hyperglycemia (HCC)  Diabetic foot exam was performed.  She is not on any medication at this time. She reports her previous provider stated she was "borderline". I think she would benefit from GLP-1 agonist therapy to help address elevated blood sugars and weight.   - Hemoglobin A1c  4. Depressed mood  She agrees to attend therapy. I will refer her to Psychology for further evaluation.   - Ambulatory referral to Psychology  5. Memory loss  I will check labs as listed below.  This could be a result of her current mood state as well. I will reassess after she has had some therapy sessions.   - Vitamin B12 - RPR  6. Hypothyroidism, unspecified type  I will check thyroid panel and adjust meds as needed.  - TSH - T4, Free  7. Impacted cerumen, bilateral  AFTER OBTAINING VERBAL CONSENT, BOTH EARS WERE FLUSHED BY IRRIGATION. SHE TOLERATED PROCEDURE WELL WITHOUT ANY COMPLICATIONS. NO TM ABNORMALITIES WERE NOTED.  - Ear Lavage  8. Class 2 severe obesity due to excess calories with serious comorbidity and body mass index (BMI) of 39.0 to 39.9 in adult [facility])  Chronic. She is encouraged to lose ten percent of her body weight to decrease cardiac risk. She is encouraged to incorporate more exercise into her daily routine, and to cut back on her intake of sugary beverages and processed foods.   Theda Belfast  [facility], MD    THE PATIENT IS ENCOURAGED TO PRACTICE SOCIAL DISTANCING DUE TO THE COVID-19 PANDEMIC.

## 2019-09-26 ENCOUNTER — Other Ambulatory Visit: Payer: Self-pay | Admitting: Internal Medicine

## 2019-10-02 ENCOUNTER — Other Ambulatory Visit: Payer: Self-pay | Admitting: Internal Medicine

## 2019-10-20 ENCOUNTER — Other Ambulatory Visit: Payer: Self-pay

## 2019-10-20 MED ORDER — CRESTOR 10 MG PO TABS: 10.0000 mg | ORAL_TABLET | Freq: Every day | ORAL | 1 refills | Status: DC

## 2019-12-19 ENCOUNTER — Other Ambulatory Visit: Payer: Self-pay | Admitting: Internal Medicine

## 2019-12-21 LAB — HM MAMMOGRAPHY

## 2019-12-22 ENCOUNTER — Ambulatory Visit: Payer: Medicare Other | Admitting: Internal Medicine

## 2019-12-23 ENCOUNTER — Other Ambulatory Visit: Payer: Self-pay

## 2019-12-23 ENCOUNTER — Encounter: Payer: Self-pay | Admitting: Internal Medicine

## 2019-12-23 ENCOUNTER — Ambulatory Visit (INDEPENDENT_AMBULATORY_CARE_PROVIDER_SITE_OTHER): Payer: Medicare Other

## 2019-12-23 ENCOUNTER — Ambulatory Visit (INDEPENDENT_AMBULATORY_CARE_PROVIDER_SITE_OTHER): Payer: Medicare Other | Admitting: Internal Medicine

## 2019-12-23 VITALS — BP 140/82 | HR 80 | Temp 98.3°F | Ht 63.0 in | Wt 223.4 lb

## 2019-12-23 VITALS — BP 140/82 | HR 80 | Temp 98.3°F | Ht 63.0 in | Wt 223.0 lb

## 2019-12-23 DIAGNOSIS — Z0000 Encounter for general adult medical examination without abnormal findings: Secondary | ICD-10-CM | POA: Diagnosis not present

## 2019-12-23 DIAGNOSIS — I10 Essential (primary) hypertension: Secondary | ICD-10-CM

## 2019-12-23 DIAGNOSIS — Z6839 Body mass index (BMI) 39.0-39.9, adult: Secondary | ICD-10-CM

## 2019-12-23 DIAGNOSIS — R109 Unspecified abdominal pain: Secondary | ICD-10-CM | POA: Diagnosis not present

## 2019-12-23 DIAGNOSIS — E1165 Type 2 diabetes mellitus with hyperglycemia: Secondary | ICD-10-CM

## 2019-12-23 LAB — POCT URINALYSIS DIPSTICK
Bilirubin, UA: NEGATIVE
Glucose, UA: NEGATIVE
Ketones, UA: NEGATIVE
Leukocytes, UA: NEGATIVE
Nitrite, UA: NEGATIVE
Protein, UA: NEGATIVE
Spec Grav, UA: >=1.03 — AB (ref 1.010–1.025)
Urobilinogen, UA: 0.2 E.U./dL
pH, UA: 6 (ref 5.0–8.0)

## 2019-12-23 MED ORDER — DIAZEPAM 2 MG PO TABS
2.0000 mg | ORAL_TABLET | Freq: Two times a day (BID) | ORAL | 0 refills | Status: AC | PRN
Start: 2019-12-23 — End: 2020-12-22

## 2019-12-23 MED ORDER — KETOROLAC TROMETHAMINE 30 MG/ML IJ SOLN
30.0000 mg | Freq: Once | INTRAMUSCULAR | Status: AC
  Administered 2019-12-23: 30 mg via INTRAMUSCULAR

## 2019-12-23 NOTE — Progress Notes (Signed)
This visit occurred during the SARS-CoV-2 public health emergency.  Safety protocols were in place, including screening questions prior to the visit, additional usage of staff PPE, and extensive cleaning of exam room while observing appropriate contact time as indicated for disinfecting solutions.  Subjective:   CHELAN HERINGER is a 79 y.o. female who presents for Medicare Annual (Subsequent) preventive examination.  Review of Systems:  n/a Cardiac Risk Factors include: advanced age (>61men, >86 women);dyslipidemia;hypertension;obesity (BMI >30kg/m2)     Objective:     Vitals: BP 140/82 (BP Location: Left Arm, Patient Position: Sitting)   Pulse 80   Temp 98.3 F (36.8 C) (Oral)   Ht 5\' 3"  (1.6 m)   Wt 223 lb (101.2 kg)   BMI 39.50 kg/m   Body mass index is 39.5 kg/m.  Advanced Directives 12/23/2019 02/05/2019 01/27/2019 05/01/2018 04/29/2018 10/23/2017 04/27/2016  Does Patient Have a Medical Advance Directive? Yes Yes Yes Yes Yes Yes Yes  Type of Paramedic of [place];Living will - Living will;Healthcare Power of Attorney Living will [place];Living will - [place];Living will  Does patient want to make changes to medical advance directive? - No - Patient declined - - No - Patient declined No - Patient declined -  Copy of [place] in Chart? Yes - validated most recent copy scanned in chart (See row information) No - copy requested Yes - validated most recent copy scanned in chart (See row information) - Yes - -  Pre-existing out of facility DNR order (yellow form or pink MOST form) - - - - - - -    Tobacco Social History   Tobacco Use  Smoking Status Never Smoker  Smokeless Tobacco Never Used     Counseling given: Not Answered   Clinical Intake:  Pre-visit preparation completed: Yes  Pain : 0-10 Pain Score: 8  Pain Type: Acute pain Pain Location: Back Pain Orientation: Right, Lateral Pain  Descriptors / Indicators: Throbbing Pain Onset: 1 to 4 weeks ago Pain Frequency: Constant Pain Relieving Factors: APAP helps temporarily  Pain Relieving Factors: APAP helps temporarily  Nutritional Status: BMI > 30  Obese Nutritional Risks: None Diabetes: No  How often do you need to have someone help you when you read instructions, pamphlets, or other written materials from your doctor or pharmacy?: 1 - Never What is the last grade level you completed in school?: 12th grade  Interpreter Needed?: No  Information entered by :: NAllen LPN  Past Medical History:  Diagnosis Date  . Arthritis    RIGHT KNEE PAIN AND OA  . Chest pain 05/03/2015  . Elevated cholesterol   . GERD (gastroesophageal reflux disease)   . Hypertension   . Hypothyroidism   . IBS (irritable bowel syndrome)   . Rotator cuff disorder    BOTH SHOULDERS - NO SURGERY - AND STATES LIMITATIONS IN ARM MOVEMENT  . Shortness of breath 05/03/2015  . Swelling    CHRONIC SWELLING RIGHT HAND AND BOTH FEET AND BOTH LEGS  . Vertigo    Past Surgical History:  Procedure Laterality Date  . ABDOMINAL HYSTERECTOMY  1985  . BREAST SURGERY  1985   RIGHT BREAST BIOPSY - BENIGN  . CARPAL TUNNEL RELEASE Left 02/05/2019   Procedure: LEFT CARPAL TUNNEL RELEASE;  Surgeon: Daryll Brod, MD;  Location: [facility];  Service: Orthopedics;  Laterality: Left;  . CHOLECYSTECTOMY  1975  . EYE SURGERY  1995   BILATERAL CATARACT EXTRACTIONS;  SURGERY FOR MACULAR HOLE   . [place]  . 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 Diabetes Mother   . Dementia Mother   . Diabetes Father   . Diabetes Sister   . Breast cancer Sister   . Kidney cancer Sister   . Lung cancer Sister   . Lung cancer Maternal Aunt   . Pancreatic  cancer Maternal Aunt    Social History   Socioeconomic History  . Marital status: Widowed    Spouse name: nathaniel  . Number of children: 4  . Years of education: 49  . Highest education level: Not on file  Occupational History  . Occupation: Retired  Tobacco Use  . Smoking status: Never Smoker  . Smokeless tobacco: Never Used  Vaping Use  . Vaping Use: Never used  Substance and Sexual Activity  . Alcohol use: Not Currently    Comment: OCCAS ALCOHOL  . Drug use: No  . Sexual activity: Not Currently  Other Topics Concern  . Not on file  Social History Narrative   [name]   Patient is retired.    Patient has 4 children.    Patient has a high school education.    Patient is widowed   Right handed      Epworth Sleepiness Scale = 1 (as of 05/03/2015)   Social Determinants of Health   Financial Resource Strain: Low Risk   . Difficulty of Paying Living Expenses: Not hard at all  Food Insecurity: No Food Insecurity  . Worried About Charity fundraiser in the Last Year: Never true  . Ran Out of Food in the Last Year: Never true  Transportation Needs: No Transportation Needs  . Lack of Transportation (Medical): No  . Lack of Transportation (Non-Medical): No  Physical Activity: Sufficiently Active  . Days of Exercise per Week: 4 days  . Minutes of Exercise per Session: 40 min  Stress: No Stress Concern Present  . Feeling of Stress : Not at all  Social Connections:   . Frequency of Communication with Friends and Family:   . Frequency of Social Gatherings with Friends and Family:   . Attends Religious Services:   . Active Member of Clubs or Organizations:   . Attends Archivist Meetings:   Marland Kitchen Marital Status:     Outpatient Encounter Medications as of 12/23/2019  Medication Sig  . Aspirin (ECOTRIN PO) Take 80 mg by mouth daily.  Marland Kitchen BENICAR 20 MG tablet TAKE 1 TABLET BY MOUTH  DAILY  . bimatoprost (LUMIGAN) 0.01 % SOLN Place 1 drop into both  eyes at bedtime.   . CRESTOR 10 MG tablet Take 1 tablet (10 mg total) by mouth daily.  . famotidine (PEPCID) 20 MG tablet Take 20 mg by mouth daily.  . furosemide (LASIX) 20 MG tablet TAKE 1 TABLET BY MOUTH IN  THE MORNING  . Lifitegrast (XIIDRA) 5 % SOLN Apply to eye. xiidra  . Magnesium 400 MG CAPS Take 400 mg by mouth daily after supper. (Patient not taking: Reported on 12/23/2019)  . Multiple Vitamins-Minerals (CENTRUM SILVER PO) Take 1 tablet by mouth daily.   . Multiple Vitamins-Minerals (EMERGEN-C VITAMIN C PO) Take 1 Package by mouth daily. Uses during the winter  . polyethylene glycol (MIRALAX /  GLYCOLAX) packet Take 17 g by mouth daily.  Marland Kitchen SYNTHROID 75 MCG tablet TAKE 1 TABLET BY MOUTH  DAILY BEFORE BREAKFAST  . vitamin B-6 (PYRIDOXINE) 25 MG tablet Take 25 mg by mouth daily.   No facility-administered encounter medications on file as of 12/23/2019.    Activities of Daily Living In your present state of health, do you have any difficulty performing the following activities: 12/23/2019 02/05/2019  Hearing? N N  Vision? N N  Difficulty concentrating or making decisions? N N  Walking or climbing stairs? Y Y  Comment - goes slow  Dressing or bathing? N N  Doing errands, shopping? N -  Preparing Food and eating ? N -  Using the Toilet? N -  In the past six months, have you accidently leaked urine? N -  Do you have problems with loss of bowel control? N -  Managing your Medications? N -  Managing your Finances? N -  Housekeeping or managing your Housekeeping? N -  Some recent data might be hidden    Patient Care Team: Glendale Chard, MD as PCP - General (Internal Medicine)    Assessment:   This is a routine wellness examination for [place].  Exercise Activities and Dietary recommendations Current Exercise Habits: Structured exercise class, Type of exercise: Other - see comments (water aerobics), Time (Minutes): 45, Frequency (Times/Week): 4, Weekly Exercise (Minutes/Week):  180  Goals    .  DIET - INCREASE WATER INTAKE (pt-stated)    .  Patient Stated      01/27/2019 patient has no goal    .  Patient Stated      12/23/2019, wants to get to 190 pounds       Fall Risk Fall Risk  12/23/2019 06/23/2019 01/27/2019 11/04/2018 05/01/2018  Falls in the past year? 0 0 0 0 No  Number falls in past yr: - - 0 - -  Risk for fall due to : Medication side effect - Medication side effect - -  Follow up Falls evaluation completed;Education provided;Falls prevention discussed - Falls evaluation completed;Falls prevention discussed - -   Is the patient's home free of loose throw rugs in walkways, pet beds, electrical cords, etc?   yes      Grab bars in the bathroom? yes      Handrails on the stairs?   n/a      Adequate lighting?   yes  Timed Get Up and Go performed: n/a  Depression Screen PHQ 2/9 Scores 12/23/2019 07/07/2019 01/27/2019 11/04/2018  PHQ - 2 Score 0 2 0 0  PHQ- 9 Score 3 6 0 -     Cognitive Function     6CIT Screen 12/23/2019 01/27/2019 05/01/2018  What Year? 0 points 0 points 0 points  What month? 0 points 0 points 0 points  What time? 0 points 0 points 0 points  Count back from 20 2 points 0 points 0 points  Months in reverse 0 points 0 points 0 points  Repeat phrase 0 points 0 points 0 points  Total Score 2 0 0    Immunization History  Administered Date(s) Administered  . Fluad Quad(high Dose 65+) 04/29/2019  . Influenza,inj,quad, With Preservative 04/08/2017  . Influenza-Unspecified 04/08/2013, 04/28/2018  . Moderna SARS-COVID-2 Vaccination 07/20/2019, 08/17/2019  . Pneumococcal Conjugate-13 04/28/2018  . Pneumococcal Polysaccharide-23 08/15/2016  . Tdap 08/15/2016  . Zoster Recombinat (Shingrix) 10/30/2017, 12/25/2017    Qualifies for Shingles Vaccine? yes  Screening Tests Health Maintenance  Topic Date Due  .  INFLUENZA VACCINE  02/07/2020  . TETANUS/TDAP  08/15/2026  . DEXA SCAN  Completed  . COVID-19 Vaccine  Completed  . PNA  vac Low Risk Adult  Completed    Cancer Screenings: Lung: Low Dose CT Chest recommended if Age 49-80 years, 30 pack-year currently smoking OR have quit w/in 15years. Patient does not qualify. Breast:  Up to date on Mammogram? Yes   Up to date of Bone Density/Dexa? Yes Colorectal: up to date  Additional Screenings: : Hepatitis C Screening: n/a     Plan:    Patient wants to get down to 190 pounds.   I have personally reviewed and noted the following in the patient's chart:   . Medical and social history . Use of alcohol, tobacco or illicit drugs  . Current medications and supplements . Functional ability and status . Nutritional status . Physical activity . Advanced directives . List of other physicians . Hospitalizations, surgeries, and ER visits in previous 12 months . Vitals . Screenings to include cognitive, depression, and falls . Referrals and appointments  In addition, I have reviewed and discussed with patient certain preventive protocols, quality metrics, and best practice recommendations. A written personalized care plan for preventive services as well as general preventive health recommendations were provided to patient.     Kellie Simmering, LPN  [id number]

## 2019-12-23 NOTE — Patient Instructions (Signed)
Flank Pain, Adult Flank pain is pain in your side. The flank is the area of your side between your upper belly (abdomen) and your back. The pain may occur over a short time (acute), or it may be long-term or come back often (chronic). It may be mild or very bad. Pain in this area can be caused by many different things. Follow these instructions at home:   Drink enough fluid to keep your pee (urine) clear or pale yellow.  Rest as told by your doctor.  Take over-the-counter and prescription medicines only as told by your doctor.  Keep a journal to keep track of: ? What has caused your flank pain. ? What has made it feel better.  Keep all follow-up visits as told by your doctor. This is important. Contact a doctor if:  Medicine does not help your pain.  You have new symptoms.  Your pain gets worse.  You have a fever.  Your symptoms last longer than 2-3 days.  You have trouble peeing.  You are peeing more often than normal. Get help right away if:  You have trouble breathing.  You are short of breath.  Your belly hurts, or it is swollen or red.  You feel sick to your stomach (nauseous).  You throw up (vomit).  You feel like you will pass out, or you do pass out (faint).  You have blood in your pee. Summary  Flank pain is pain in your side. The flank is the area of your side between your upper belly (abdomen) and your back.  Flank pain may occur over a short time (acute), or it may be long-term or come back often (chronic). It may be mild or very bad.  Pain in this area can be caused by many different things.  Contact your doctor if your symptoms get worse or they last longer than 2-3 days. This information is not intended to replace advice given to you by your health care provider. Make sure you discuss any questions you have with your health care provider. Document Revised: 06/07/2017 Document Reviewed: 10/15/2016 Elsevier Patient Education  2020 [employer]  [employer]  

## 2019-12-23 NOTE — Patient Instructions (Signed)
[name] , Thank you for taking time to come for your Medicare Wellness Visit. I appreciate your ongoing commitment to your health goals. Please review the following plan we discussed and let me know if I can assist you in the future.   Screening recommendations/referrals: Colonoscopy: 08/2018 Mammogram: 12/2018 Bone Density: 12/2000 Recommended yearly ophthalmology/optometry visit for glaucoma screening and checkup Recommended yearly dental visit for hygiene and checkup  Vaccinations: Influenza vaccine: 04/2019 Pneumococcal vaccine: 04/2018 Tdap vaccine: 08/2016 Shingles vaccine: discussed    Advanced directives: copy in chart  Conditions/risks identified: obesity  Next appointment: 07/13/2020 at 10:30   Preventive Care 75 Years and Older, Female Preventive care refers to lifestyle choices and visits with your health care provider that can promote health and wellness. What does preventive care include?  A yearly physical exam. This is also called an annual well check.  Dental exams once or twice a year.  Routine eye exams. Ask your health care provider how often you should have your eyes checked.  Personal lifestyle choices, including:  Daily care of your teeth and gums.  Regular physical activity.  Eating a healthy diet.  Avoiding tobacco and drug use.  Limiting alcohol use.  Practicing safe sex.  Taking low-dose aspirin every day.  Taking vitamin and mineral supplements as recommended by your health care provider. What happens during an annual well check? The services and screenings done by your health care provider during your annual well check will depend on your age, overall health, lifestyle risk factors, and family history of disease. Counseling  Your health care provider may ask you questions about your:  Alcohol use.  Tobacco use.  Drug use.  Emotional well-being.  Home and relationship well-being.  Sexual activity.  Eating habits.  History of  falls.  Memory and ability to understand (cognition).  Work and work Statistician.  Reproductive health. Screening  You may have the following tests or measurements:  Height, weight, and BMI.  Blood pressure.  Lipid and cholesterol levels. These may be checked every 5 years, or more frequently if you are over 57 years old.  Skin check.  Lung cancer screening. You may have this screening every year starting at age 82 if you have a 30-pack-year history of smoking and currently smoke or have quit within the past 15 years.  Fecal occult blood test (FOBT) of the stool. You may have this test every year starting at age 60.  Flexible sigmoidoscopy or colonoscopy. You may have a sigmoidoscopy every 5 years or a colonoscopy every 10 years starting at age 26.  Hepatitis C blood test.  Hepatitis B blood test.  Sexually transmitted disease (STD) testing.  Diabetes screening. This is done by checking your blood sugar (glucose) after you have not eaten for a while (fasting). You may have this done every 1-3 years.  Bone density scan. This is done to screen for osteoporosis. You may have this done starting at age 20.  Mammogram. This may be done every 1-2 years. Talk to your health care provider about how often you should have regular mammograms. Talk with your health care provider about your test results, treatment options, and if necessary, the need for more tests. Vaccines  Your health care provider may recommend certain vaccines, such as:  Influenza vaccine. This is recommended every year.  Tetanus, diphtheria, and acellular pertussis (Tdap, Td) vaccine. You may need a Td booster every 10 years.  Zoster vaccine. You may need this after age 57.  Pneumococcal 13-valent  conjugate (PCV13) vaccine. One dose is recommended after age 44.  Pneumococcal polysaccharide (PPSV23) vaccine. One dose is recommended after age 81. Talk to your health care provider about which screenings and  vaccines you need and how often you need them. This information is not intended to replace advice given to you by your health care provider. Make sure you discuss any questions you have with your health care provider. Document Released: 07/22/2015 Document Revised: 03/14/2016 Document Reviewed: 04/26/2015 Elsevier Interactive Patient Education  2017 [place] Prevention in the Home Falls can cause injuries. They can happen to people of all ages. There are many things you can do to make your home safe and to help prevent falls. What can I do on the outside of my home?  Regularly fix the edges of walkways and driveways and fix any cracks.  Remove anything that might make you trip as you walk through a door, such as a raised step or threshold.  Trim any bushes or trees on the path to your home.  Use bright outdoor lighting.  Clear any walking paths of anything that might make someone trip, such as rocks or tools.  Regularly check to see if handrails are loose or broken. Make sure that both sides of any steps have handrails.  Any raised decks and porches should have guardrails on the edges.  Have any leaves, snow, or ice cleared regularly.  Use sand or salt on walking paths during winter.  Clean up any spills in your garage right away. This includes oil or grease spills. What can I do in the bathroom?  Use night lights.  Install grab bars by the toilet and in the tub and shower. Do not use towel bars as grab bars.  Use non-skid mats or decals in the tub or shower.  If you need to sit down in the shower, use a plastic, non-slip stool.  Keep the floor dry. Clean up any water that spills on the floor as soon as it happens.  Remove soap buildup in the tub or shower regularly.  Attach bath mats securely with double-sided non-slip rug tape.  Do not have throw rugs and other things on the floor that can make you trip. What can I do in the bedroom?  Use night  lights.  Make sure that you have a light by your bed that is easy to reach.  Do not use any sheets or blankets that are too big for your bed. They should not hang down onto the floor.  Have a firm chair that has side arms. You can use this for support while you get dressed.  Do not have throw rugs and other things on the floor that can make you trip. What can I do in the kitchen?  Clean up any spills right away.  Avoid walking on wet floors.  Keep items that you use a lot in easy-to-reach places.  If you need to reach something above you, use a strong step stool that has a grab bar.  Keep electrical cords out of the way.  Do not use floor polish or wax that makes floors slippery. If you must use wax, use non-skid floor wax.  Do not have throw rugs and other things on the floor that can make you trip. What can I do with my stairs?  Do not leave any items on the stairs.  Make sure that there are handrails on both sides of the stairs and use them. Fix handrails  that are broken or loose. Make sure that handrails are as long as the stairways.  Check any carpeting to make sure that it is firmly attached to the stairs. Fix any carpet that is loose or worn.  Avoid having throw rugs at the top or bottom of the stairs. If you do have throw rugs, attach them to the floor with carpet tape.  Make sure that you have a light switch at the top of the stairs and the bottom of the stairs. If you do not have them, ask someone to add them for you. What else can I do to help prevent falls?  Wear shoes that:  Do not have high heels.  Have rubber bottoms.  Are comfortable and fit you well.  Are closed at the toe. Do not wear sandals.  If you use a stepladder:  Make sure that it is fully opened. Do not climb a closed stepladder.  Make sure that both sides of the stepladder are locked into place.  Ask someone to hold it for you, if possible.  Clearly mark and make sure that you can  see:  Any grab bars or handrails.  First and last steps.  Where the edge of each step is.  Use tools that help you move around (mobility aids) if they are needed. These include:  Canes.  Walkers.  Scooters.  Crutches.  Turn on the lights when you go into a dark area. Replace any light bulbs as soon as they burn out.  Set up your furniture so you have a clear path. Avoid moving your furniture around.  If any of your floors are uneven, fix them.  If there are any pets around you, be aware of where they are.  Review your medicines with your doctor. Some medicines can make you feel dizzy. This can increase your chance of falling. Ask your doctor what other things that you can do to help prevent falls. This information is not intended to replace advice given to you by your health care provider. Make sure you discuss any questions you have with your health care provider. Document Released: 04/21/2009 Document Revised: 12/01/2015 Document Reviewed: 07/30/2014 Elsevier Interactive Patient Education  2017 [employer].

## 2019-12-24 ENCOUNTER — Telehealth: Payer: Self-pay

## 2019-12-24 ENCOUNTER — Encounter: Payer: Self-pay | Admitting: Internal Medicine

## 2019-12-24 LAB — CMP14+EGFR
ALT: 27 IU/L (ref 0–32)
AST: 21 IU/L (ref 0–40)
Albumin/Globulin Ratio: 2.1 (ref 1.2–2.2)
Albumin: 4.1 g/dL (ref 3.7–4.7)
Alkaline Phosphatase: 110 IU/L (ref 48–121)
BUN/Creatinine Ratio: 25 (ref 12–28)
BUN: 16 mg/dL (ref 8–27)
Bilirubin Total: 0.6 mg/dL (ref 0.0–1.2)
CO2: 22 mmol/L (ref 20–29)
Calcium: 10.1 mg/dL (ref 8.7–10.3)
Chloride: 106 mmol/L (ref 96–106)
Creatinine, Ser: 0.63 mg/dL (ref 0.57–1.00)
GFR calc Af Amer: 98 mL/min/{1.73_m2} (ref >59)
GFR calc non Af Amer: 85 mL/min/{1.73_m2} (ref >59)
Globulin, Total: 2 g/dL (ref 1.5–4.5)
Glucose: 100 mg/dL — ABNORMAL HIGH (ref 65–99)
Potassium: 4.2 mmol/L (ref 3.5–5.2)
Sodium: 141 mmol/L (ref 134–144)
Total Protein: 6.1 g/dL (ref 6.0–8.5)

## 2019-12-24 LAB — CBC
Hematocrit: 45.6 % (ref 34.0–46.6)
Hemoglobin: 15.3 g/dL (ref 11.1–15.9)
MCH: 30.6 pg (ref 26.6–33.0)
MCHC: 33.6 g/dL (ref 31.5–35.7)
MCV: 91 fL (ref 79–97)
Platelets: 160 10*3/uL (ref 150–450)
RBC: 5 x10E6/uL (ref 3.77–5.28)
RDW: 13.8 % (ref 11.7–15.4)
WBC: 4.7 10*3/uL (ref 3.4–10.8)

## 2019-12-24 LAB — HEMOGLOBIN A1C
Est. average glucose Bld gHb Est-mCnc: 143 mg/dL
Hgb A1c MFr Bld: 6.6 % — ABNORMAL HIGH (ref 4.8–5.6)

## 2019-12-24 NOTE — Telephone Encounter (Signed)
Left vm. Returning call to pt about flank pain

## 2019-12-24 NOTE — Telephone Encounter (Signed)
I called the pt to let her know that the Dr. Baird Cancer has written the prescription for the compression hoses and that it will be placed in the mail to the patient.

## 2019-12-25 ENCOUNTER — Other Ambulatory Visit: Payer: Self-pay | Admitting: Internal Medicine

## 2020-01-03 NOTE — Progress Notes (Signed)
This visit occurred during the SARS-CoV-2 public health emergency.  Safety protocols were in place, including screening questions prior to the visit, additional usage of staff PPE, and extensive cleaning of exam room while observing appropriate contact time as indicated for disinfecting solutions.  Subjective:     Patient ID: [name] , female    DOB: May 04, 1941 , 79 y.o.   MRN: [id number]   Chief Complaint  Patient presents with  . Hypertension  . Flank Pain     lower back pain    HPI  She presents today for BP check, reports compliance with meds. She is also concerned about right flank pain. Denies fall/trauma.   Hypertension This is a chronic problem. The current episode started more than 1 year ago. The problem has been gradually improving since onset. The problem is controlled. Pertinent negatives include no blurred vision. Risk factors for coronary artery disease include dyslipidemia, obesity, post-menopausal state and sedentary lifestyle. The current treatment provides moderate improvement. Compliance problems include exercise.   Flank Pain This is a new problem. The current episode started 1 to 4 weeks ago. The problem is unchanged. The pain is at a severity of 6/10. The pain is moderate. The symptoms are aggravated by bending. Pertinent negatives include no abdominal pain, bladder incontinence or bowel incontinence.     Past Medical History:  Diagnosis Date  . Arthritis    RIGHT KNEE PAIN AND OA  . Chest pain 05/03/2015  . Elevated cholesterol   . GERD (gastroesophageal reflux disease)   . Hypertension   . Hypothyroidism   . IBS (irritable bowel syndrome)   . Rotator cuff disorder    BOTH SHOULDERS - NO SURGERY - AND STATES LIMITATIONS IN ARM MOVEMENT  . Shortness of breath 05/03/2015  . Swelling    CHRONIC SWELLING RIGHT HAND AND BOTH FEET AND BOTH LEGS  . Vertigo      Family History  Problem Relation Age of Onset  . Diabetes Mother   . Dementia Mother   .  Diabetes Father   . Diabetes Sister   . Breast cancer Sister   . Kidney cancer Sister   . Lung cancer Sister   . Lung cancer Maternal Aunt   . Pancreatic cancer Maternal Aunt      Current Outpatient Medications:  .  Aspirin (ECOTRIN PO), Take 80 mg by mouth daily., Disp: , Rfl:  .  BENICAR 20 MG tablet, TAKE 1 TABLET BY MOUTH  DAILY, Disp: 90 tablet, Rfl: 1 .  bimatoprost (LUMIGAN) 0.01 % SOLN, Place 1 drop into both eyes at bedtime. , Disp: , Rfl:  .  CRESTOR 10 MG tablet, Take 1 tablet (10 mg total) by mouth daily., Disp: 90 tablet, Rfl: 1 .  famotidine (PEPCID) 20 MG tablet, Take 20 mg by mouth daily., Disp: , Rfl:  .  furosemide (LASIX) 20 MG tablet, TAKE 1 TABLET BY MOUTH IN  THE MORNING, Disp: 90 tablet, Rfl: 3 .  Lifitegrast (XIIDRA) 5 % SOLN, Apply to eye. xiidra, Disp: , Rfl:  .  Multiple Vitamins-Minerals (CENTRUM SILVER PO), Take 1 tablet by mouth daily. , Disp: , Rfl:  .  Multiple Vitamins-Minerals (EMERGEN-C VITAMIN C PO), Take 1 Package by mouth daily. Uses during the winter, Disp: , Rfl:  .  polyethylene glycol (MIRALAX / GLYCOLAX) packet, Take 17 g by mouth daily., Disp: , Rfl:  .  SYNTHROID 75 MCG tablet, TAKE 1 TABLET BY MOUTH  DAILY BEFORE BREAKFAST, Disp: 90 tablet, Rfl:  3 .  vitamin B-6 (PYRIDOXINE) 25 MG tablet, Take 25 mg by mouth daily., Disp: , Rfl:  .  diazepam (VALIUM) 2 MG tablet, Take 1 tablet (2 mg total) by mouth every 12 (twelve) hours as needed for anxiety or muscle spasms., Disp: 20 tablet, Rfl: 0 .  MAGNESIUM-OXIDE 400 (241.3 Mg) MG tablet, TAKE 1 TABLET BY MOUTH EVERY DAY AFTER SUPPER, Disp: 90 tablet, Rfl: 1   Allergies  Allergen Reactions  . Codeine Nausea And Vomiting  . Macrobid [Nitrofurantoin Macrocrystal]     RASH  . Tramadol Nausea And Vomiting     Review of Systems  Constitutional: Negative.   Eyes: Negative for blurred vision.  Respiratory: Negative.   Cardiovascular: Negative.   Gastrointestinal: Negative.  Negative for abdominal  pain and bowel incontinence.       She c/o flank pain. Pain is on right side. No relief with Tylenol and Celebrex. Described as dull, throbbing. Her sx started aobut a week ago. Denies hematuria. Denies dysuria, fever, chills. Not sure what may have triggered her sx.   Genitourinary: Positive for flank pain. Negative for bladder incontinence.  Neurological: Negative.   Psychiatric/Behavioral: Negative.      Today's Vitals   12/23/19 1106  BP: 140/82  Pulse: 80  Temp: 98.3 F (36.8 C)  TempSrc: Oral  Weight: 223 lb 6.4 oz (101.3 kg)  Height: '5\' 3"'$  (1.6 m)  PainSc: 8   PainLoc: Back   Body mass index is 39.57 kg/m.   Objective:  Physical Exam Vitals and nursing note reviewed.  Constitutional:      Appearance: Normal appearance. She is obese.  HENT:     Head: Normocephalic and atraumatic.  Cardiovascular:     Rate and Rhythm: Normal rate and regular rhythm.     Heart sounds: Normal heart sounds.  Pulmonary:     Effort: Pulmonary effort is normal.     Breath sounds: Normal breath sounds.  Musculoskeletal:     Comments: No bruising b/l flank. There is pain with movement and with palpation. No rash noted. No erythema noted.   Skin:    General: Skin is warm.  Neurological:     General: No focal deficit present.     Mental Status: She is alert.  Psychiatric:        Mood and Affect: Mood normal.        Behavior: Behavior normal.         Assessment And Plan:     1. Essential hypertension  Chronic, uncontrolled. Her BP may be elevated due to her discomfort. She will continue with current meds for now.   2. Flank pain  Toradol, '30mg'$  IM x 1 given to the patient. She is advised to monitor her urine. I will check urinalysis.   - ketorolac (TORADOL) 30 MG/ML injection 30 mg - POCT Urinalysis Dipstick (81002)  3. Uncontrolled type 2 diabetes mellitus with hyperglycemia (HCC)  Chronic, I will check labs as listed below. Importance of dietary and exercise compliance was  discussed with the patient.   - CMP14+EGFR - Hemoglobin A1c - CBC no Diff  4. Class 2 severe obesity due to excess calories with serious comorbidity and body mass index (BMI) of 39.0 to 39.9 in adult [facility])  She is encouraged to strive for BMI less than 34 to decrease cardiac risk.  Encouraged to work up to 30 minutes five days per week of regular exercise.   [name], MD    THE PATIENT IS  ENCOURAGED TO PRACTICE SOCIAL DISTANCING DUE TO THE COVID-19 PANDEMIC.   

## 2020-01-05 ENCOUNTER — Ambulatory Visit (INDEPENDENT_AMBULATORY_CARE_PROVIDER_SITE_OTHER): Payer: Medicare Other

## 2020-01-05 ENCOUNTER — Other Ambulatory Visit: Payer: Self-pay | Admitting: Internal Medicine

## 2020-01-05 ENCOUNTER — Other Ambulatory Visit: Payer: Self-pay

## 2020-01-05 VITALS — BP 122/64 | HR 76 | Temp 97.6°F | Ht 62.0 in | Wt 224.4 lb

## 2020-01-05 DIAGNOSIS — R3129 Other microscopic hematuria: Secondary | ICD-10-CM | POA: Diagnosis not present

## 2020-01-05 LAB — POCT URINALYSIS DIPSTICK
Bilirubin, UA: NEGATIVE
Glucose, UA: NEGATIVE
Ketones, UA: NEGATIVE
Nitrite, UA: NEGATIVE
Protein, UA: NEGATIVE
Spec Grav, UA: 1.01 (ref 1.010–1.025)
Urobilinogen, UA: 0.2 E.U./dL
pH, UA: 7 (ref 5.0–8.0)

## 2020-01-05 NOTE — Progress Notes (Signed)
Pt is here today for a urine check. Pt had blood in her urine last visit. There is still small blood in her urine today. Provider is going to look into sending pt for a renal u/s

## 2020-01-19 ENCOUNTER — Ambulatory Visit
Admission: RE | Admit: 2020-01-19 | Discharge: 2020-01-19 | Disposition: A | Discharge status: Home / Self Care | Payer: Medicare Other | Source: Ambulatory Visit | Attending: Internal Medicine | Admitting: Internal Medicine

## 2020-01-19 DIAGNOSIS — R3129 Other microscopic hematuria: Secondary | ICD-10-CM

## 2020-01-25 ENCOUNTER — Other Ambulatory Visit: Payer: Self-pay | Admitting: Internal Medicine

## 2020-01-25 DIAGNOSIS — R3129 Other microscopic hematuria: Secondary | ICD-10-CM

## 2020-01-25 DIAGNOSIS — N200 Calculus of kidney: Secondary | ICD-10-CM

## 2020-01-28 ENCOUNTER — Ambulatory Visit: Payer: Medicare Other

## 2020-01-28 ENCOUNTER — Ambulatory Visit: Payer: Medicare Other | Admitting: Internal Medicine

## 2020-03-14 ENCOUNTER — Other Ambulatory Visit: Payer: Self-pay | Admitting: Internal Medicine

## 2020-03-16 ENCOUNTER — Ambulatory Visit (INDEPENDENT_AMBULATORY_CARE_PROVIDER_SITE_OTHER): Payer: Medicare Other | Admitting: Internal Medicine

## 2020-03-16 ENCOUNTER — Encounter: Payer: Self-pay | Admitting: Internal Medicine

## 2020-03-16 ENCOUNTER — Other Ambulatory Visit: Payer: Self-pay

## 2020-03-16 VITALS — BP 142/76 | HR 74 | Temp 97.4°F | Ht 62.0 in | Wt 221.2 lb

## 2020-03-16 DIAGNOSIS — E7800 Pure hypercholesterolemia, unspecified: Secondary | ICD-10-CM | POA: Diagnosis not present

## 2020-03-16 DIAGNOSIS — E039 Hypothyroidism, unspecified: Secondary | ICD-10-CM

## 2020-03-16 DIAGNOSIS — E1165 Type 2 diabetes mellitus with hyperglycemia: Secondary | ICD-10-CM

## 2020-03-16 DIAGNOSIS — I10 Essential (primary) hypertension: Secondary | ICD-10-CM | POA: Diagnosis not present

## 2020-03-16 DIAGNOSIS — H9201 Otalgia, right ear: Secondary | ICD-10-CM

## 2020-03-16 DIAGNOSIS — Z6839 Body mass index (BMI) 39.0-39.9, adult: Secondary | ICD-10-CM

## 2020-03-16 DIAGNOSIS — R3129 Other microscopic hematuria: Secondary | ICD-10-CM
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
## 2020-03-16 NOTE — Patient Instructions (Signed)
Health Maintenance, Female Adopting a healthy lifestyle and getting preventive care are important in promoting health and wellness. Ask your health care provider about:  The right schedule for you to have regular tests and exams.  Things you can do on your own to prevent diseases and keep yourself healthy. What should I know about diet, weight, and exercise? Eat a healthy diet   Eat a diet that includes plenty of vegetables, fruits, low-fat dairy products, and lean protein.  Do not eat a lot of foods that are high in solid fats, added sugars, or sodium. Maintain a healthy weight Body mass index (BMI) is used to identify weight problems. It estimates body fat based on height and weight. Your health care provider can help determine your BMI and help you achieve or maintain a healthy weight. Get regular exercise Get regular exercise. This is one of the most important things you can do for your health. Most adults should:  Exercise for at least 150 minutes each week. The exercise should increase your heart rate and make you sweat (moderate-intensity exercise).  Do strengthening exercises at least twice a week. This is in addition to the moderate-intensity exercise.  Spend less time sitting. Even light physical activity can be beneficial. Watch cholesterol and blood lipids Have your blood tested for lipids and cholesterol at 81 years of age, then have this test every 5 years. Have your cholesterol levels checked more often if:  Your lipid or cholesterol levels are high.  You are older than 81 years of age.  You are at high risk for heart disease. What should I know about cancer screening? Depending on your health history and family history, you may need to have cancer screening at various ages. This may include screening for:  Breast cancer.  Cervical cancer.  Colorectal cancer.  Skin cancer.  Lung cancer. What should I know about heart disease, diabetes, and high blood  pressure? Blood pressure and heart disease  High blood pressure causes heart disease and increases the risk of stroke. This is more likely to develop in people who have high blood pressure readings, are of African descent, or are overweight.  Have your blood pressure checked: ? Every 3-5 years if you are 18-39 years of age. ? Every year if you are 40 years old or older. Diabetes Have regular diabetes screenings. This checks your fasting blood sugar level. Have the screening done:  Once every three years after age 40 if you are at a normal weight and have a low risk for diabetes.  More often and at a younger age if you are overweight or have a high risk for diabetes. What should I know about preventing infection? Hepatitis B If you have a higher risk for hepatitis B, you should be screened for this virus. Talk with your health care provider to find out if you are at risk for hepatitis B infection. Hepatitis C Testing is recommended for:  Everyone born from 1945 through 1965.  Anyone with known risk factors for hepatitis C. Sexually transmitted infections (STIs)  Get screened for STIs, including gonorrhea and chlamydia, if: ? You are sexually active and are younger than 81 years of age. ? You are older than 81 years of age and your health care provider tells you that you are at risk for this type of infection. ? Your sexual activity has changed since you were last screened, and you are at increased risk for chlamydia or gonorrhea. Ask your health care provider if   you are at risk.  Ask your health care provider about whether you are at high risk for HIV. Your health care provider may recommend a prescription medicine to help prevent HIV infection. If you choose to take medicine to prevent HIV, you should first get tested for HIV. You should then be tested every 3 months for as long as you are taking the medicine. Pregnancy  If you are about to stop having your period (premenopausal) and  you may become pregnant, seek counseling before you get pregnant.  Take 400 to 800 micrograms (mcg) of folic acid every day if you become pregnant.  Ask for birth control (contraception) if you want to prevent pregnancy. Osteoporosis and menopause Osteoporosis is a disease in which the bones lose minerals and strength with aging. This can result in bone fractures. If you are 65 years old or older, or if you are at risk for osteoporosis and fractures, ask your health care provider if you should:  Be screened for bone loss.  Take a calcium or vitamin D supplement to lower your risk of fractures.  Be given hormone replacement therapy (HRT) to treat symptoms of menopause. Follow these instructions at home: Lifestyle  Do not use any products that contain nicotine or tobacco, such as cigarettes, e-cigarettes, and chewing tobacco. If you need help quitting, ask your health care provider.  Do not use street drugs.  Do not share needles.  Ask your health care provider for help if you need support or information about quitting drugs. Alcohol use  Do not drink alcohol if: ? Your health care provider tells you not to drink. ? You are pregnant, may be pregnant, or are planning to become pregnant.  If you drink alcohol: ? Limit how much you use to 0-1 drink a day. ? Limit intake if you are breastfeeding.  Be aware of how much alcohol is in your drink. In the [place] one drink equals one 12 oz bottle of beer (355 mL), one 5 oz glass of wine (148 mL), or one 1 oz glass of hard liquor (44 mL). General instructions  Schedule regular health, dental, and eye exams.  Stay current with your vaccines.  Tell your health care provider if: ? You often feel depressed. ? You have ever been abused or do not feel safe at home. Summary  Adopting a healthy lifestyle and getting preventive care are important in promoting health and wellness.  Follow your health care provider's instructions about healthy  diet, exercising, and getting tested or screened for diseases.  Follow your health care provider's instructions on monitoring your cholesterol and blood pressure. This information is not intended to replace advice given to you by your health care provider. Make sure you discuss any questions you have with your health care provider. Document Revised: 06/18/2018 Document Reviewed: 06/18/2018 Elsevier Patient Education  2020 [employer]  

## 2020-03-17 LAB — BMP8+EGFR
BUN/Creatinine Ratio: 19 (ref 12–28)
BUN: 14 mg/dL (ref 8–27)
CO2: 24 mmol/L (ref 20–29)
Calcium: 10.8 mg/dL — ABNORMAL HIGH (ref 8.7–10.3)
Chloride: 108 mmol/L — ABNORMAL HIGH (ref 96–106)
Creatinine, Ser: 0.72 mg/dL (ref 0.57–1.00)
GFR calc Af Amer: 91 mL/min/{1.73_m2} (ref >59)
GFR calc non Af Amer: 79 mL/min/{1.73_m2} (ref >59)
Glucose: 100 mg/dL — ABNORMAL HIGH (ref 65–99)
Potassium: 4.4 mmol/L (ref 3.5–5.2)
Sodium: 143 mmol/L (ref 134–144)

## 2020-03-17 LAB — LIPID PANEL
Chol/HDL Ratio: 2.7 ratio (ref 0.0–4.4)
Cholesterol, Total: 158 mg/dL (ref 100–199)
HDL: 58 mg/dL (ref >39)
LDL Chol Calc (NIH): 86 mg/dL (ref 0–99)
Triglycerides: 71 mg/dL (ref 0–149)
VLDL Cholesterol Cal: 14 mg/dL (ref 5–40)

## 2020-03-17 LAB — TSH: TSH: 0.775 u[IU]/mL (ref 0.450–4.500)

## 2020-03-17 LAB — HEMOGLOBIN A1C
Est. average glucose Bld gHb Est-mCnc: 143 mg/dL
Hgb A1c MFr Bld: 6.6 % — ABNORMAL HIGH (ref 4.8–5.6)

## 2020-03-22 ENCOUNTER — Ambulatory Visit: Payer: Medicare Other | Admitting: Internal Medicine

## 2020-03-28 ENCOUNTER — Telehealth: Payer: Self-pay

## 2020-03-28 NOTE — Telephone Encounter (Signed)
The pt said yes she takes her cholesterol med daily, yes she will increase her rosuvastatin, the pt said that she would like to work on her a1c with diet and exercise first before starting a medication for her diabetes, the pt said she has a referral to a nutritionist. .

## 2020-04-28 ENCOUNTER — Encounter: Payer: Self-pay | Admitting: Registered"

## 2020-04-28 ENCOUNTER — Other Ambulatory Visit: Payer: Self-pay

## 2020-04-28 ENCOUNTER — Encounter: Payer: Medicare Other | Attending: Internal Medicine | Admitting: Registered"

## 2020-04-28 DIAGNOSIS — E119 Type 2 diabetes mellitus without complications: Secondary | ICD-10-CM | POA: Diagnosis present

## 2020-04-28 NOTE — Progress Notes (Signed)
Diabetes Self-Management Education  Visit Type:  First/Initial  Appt. Start Time: 2:03 Appt. End Time: 3:10  04/28/2020  [name], identified by name and date of birth, is a 79 y.o. female with a diagnosis of Diabetes: Type 2.   ASSESSMENT  States she attends water aerobics 4x/week and has recently started walking 20 minutes prior to water aerobics class.   Checks BS 3x/week Fri-Sun: FBS (120-135) and afternoons (105).   States she uses [employer] and aims for 1200 cals/day. Initially, it was for weight loss to try to decrease weight to 200 lbs. States she has started to gain weight after age 51.   Reports her 2 older children have diabetes.   There were no vitals taken for this visit. There is no height or weight on file to calculate BMI.    Diabetes Self-Management Education - 04/28/20 1413      Health Coping   How would you rate your overall health? Good      Psychosocial Assessment   Patient Belief/Attitude about Diabetes Motivated to manage diabetes    Self-care barriers None    Self-management support Doctor's office    Patient Concerns Nutrition/Meal planning    Special Needs None    Preferred Learning Style No preference indicated    Learning Readiness Ready      Complications   Last HgB A1C per patient/outside source 6.6 %    How often do you check your blood sugar? 1-2 times/day    Fasting Blood glucose range (mg/dL) 70-129;130-179    Postprandial Blood glucose range (mg/dL) 70-129    Number of hypoglycemic episodes per month 0    Number of hyperglycemic episodes per week 0    Have you had a dilated eye exam in the past 12 months? Yes    Have you had a dental exam in the past 12 months? Yes    Are you checking your feet? No      Dietary Intake   Breakfast toast + chicken bacon + cream cheese/strawberry jelly    Lunch salad + salmon/chicken + tortilla chips + 2 butter cookies    Dinner baked chicken + green beans + sliced tomato + corn + roll     Snack (evening) sometimes 2 peanut butter crackers      Exercise   Exercise Type Moderate (swimming / aerobic walking)    How many days per week to you exercise? 4    How many minutes per day do you exercise? 60    Total minutes per week of exercise 240      Patient Education   Previous Diabetes Education No    Disease state  Factors that contribute to the development of diabetes;Definition of diabetes, type 1 and 2, and the diagnosis of diabetes    Nutrition management  Role of diet in the treatment of diabetes and the relationship between the three main macronutrients and blood glucose level;Food label reading, portion sizes and measuring food.;Reviewed blood glucose goals for pre and post meals and how to evaluate the patients' food intake on their blood glucose level.;Effects of alcohol on blood glucose and safety factors with consumption of alcohol.    Physical activity and exercise  Role of exercise on diabetes management, blood pressure control and cardiac health.    Monitoring Purpose and frequency of SMBG.;Taught/discussed recording of test results and interpretation of SMBG.;Interpreting lab values - A1C, lipid, urine microalbumina.;Identified appropriate SMBG and/or A1C goals.;Daily foot exams    Acute complications Taught  treatment of hypoglycemia - the 15 rule.;Discussed and identified patients' treatment of hyperglycemia.    Chronic complications Relationship between chronic complications and blood glucose control;Applicable immunizations;Reviewed with patient heart disease, higher risk of, and prevention;Lipid levels, blood glucose control and heart disease    Psychosocial adjustment Role of stress on diabetes      Individualized Goals (developed by patient)   Nutrition General guidelines for healthy choices and portions discussed    Physical Activity Exercise 3-5 times per week;60 minutes per day    Medications Not Applicable    Monitoring  test my blood glucose as discussed     Reducing Risk examine blood glucose patterns;treat hypoglycemia with 15 grams of carbs if blood glucose less than 70mg /dL      Post-Education Assessment   Patient understands the diabetes disease and treatment process. Demonstrates understanding / competency    Patient understands incorporating nutritional management into lifestyle. Demonstrates understanding / competency    Patient undertands incorporating physical activity into lifestyle. Demonstrates understanding / competency    Patient understands using medications safely. Demonstrates understanding / competency    Patient understands monitoring blood glucose, interpreting and using results Demonstrates understanding / competency    Patient understands prevention, detection, and treatment of acute complications. Demonstrates understanding / competency    Patient understands prevention, detection, and treatment of chronic complications. Needs Review    Patient understands how to develop strategies to address psychosocial issues. Demonstrates understanding / competency    Patient understands how to develop strategies to promote health/change behavior. Demonstrates understanding / competency      Outcomes   Program Status Not Completed           Learning Objective:  Patient will have a greater understanding of diabetes self-management. Patient education plan is to attend individual and/or group sessions per assessed needs and concerns.   Plan:   Patient Instructions  Goals:  Follow Diabetes Meal Plan as instructed  Eat 3 meals and 2 snacks, every 3-5 hrs  Aim to have 1/2 plate non-starchy vegetables, 1/4 plate protein, and 1/4 plate starch/grain for lunch and dinner.   Add lean protein foods to meals/snacks  Monitor glucose levels as instructed by your doctor  Aim for 30 mins of physical activity most days of the week. You are doing a great job being active!     Expected Outcomes:  Demonstrated interest in learning.  Expect positive outcomes  Education material provided: ADA - How to Thrive: A Guide for Your Journey with Diabetes  If problems or questions, patient to contact team via:  Phone and Email  Future DSME appointment: - 4-6 wks

## 2020-04-28 NOTE — Patient Instructions (Signed)
Goals:  Follow Diabetes Meal Plan as instructed  Eat 3 meals and 2 snacks, every 3-5 hrs  Aim to have 1/2 plate non-starchy vegetables, 1/4 plate protein, and 1/4 plate starch/grain for lunch and dinner.   Add lean protein foods to meals/snacks  Monitor glucose levels as instructed by your doctor  Aim for 30 mins of physical activity most days of the week. You are doing a great job being active!

## 2020-05-26 ENCOUNTER — Ambulatory Visit: Payer: Medicare Other | Admitting: Registered"

## 2020-05-26 ENCOUNTER — Encounter: Payer: Medicare Other | Attending: Internal Medicine | Admitting: Registered"

## 2020-05-26 DIAGNOSIS — E119 Type 2 diabetes mellitus without complications: Secondary | ICD-10-CM | POA: Insufficient documentation

## 2020-06-16 ENCOUNTER — Encounter: Payer: Self-pay | Admitting: Registered"

## 2020-06-16 ENCOUNTER — Encounter: Payer: Medicare Other | Attending: Internal Medicine | Admitting: Registered"

## 2020-06-16 ENCOUNTER — Other Ambulatory Visit: Payer: Self-pay

## 2020-06-16 DIAGNOSIS — E119 Type 2 diabetes mellitus without complications: Secondary | ICD-10-CM | POA: Diagnosis present
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
## 2020-06-16 NOTE — Patient Instructions (Signed)
-   Check feet 7 days/week using mirror.   - Keep up the great work eating throughout the day and staying active!

## 2020-07-13 ENCOUNTER — Encounter: Payer: Self-pay | Admitting: Internal Medicine

## 2020-07-13 ENCOUNTER — Other Ambulatory Visit: Payer: Self-pay

## 2020-07-13 ENCOUNTER — Ambulatory Visit (INDEPENDENT_AMBULATORY_CARE_PROVIDER_SITE_OTHER): Payer: Medicare Other | Admitting: Internal Medicine

## 2020-07-13 VITALS — BP 136/72 | HR 81 | Temp 97.7°F | Ht 62.0 in | Wt 216.4 lb

## 2020-07-13 DIAGNOSIS — I739 Peripheral vascular disease, unspecified: Secondary | ICD-10-CM

## 2020-07-13 DIAGNOSIS — Z0000 Encounter for general adult medical examination without abnormal findings: Secondary | ICD-10-CM

## 2020-07-13 DIAGNOSIS — M47819 Spondylosis without myelopathy or radiculopathy, site unspecified: Secondary | ICD-10-CM

## 2020-07-13 DIAGNOSIS — E1165 Type 2 diabetes mellitus with hyperglycemia: Secondary | ICD-10-CM

## 2020-07-13 DIAGNOSIS — E7800 Pure hypercholesterolemia, unspecified: Secondary | ICD-10-CM

## 2020-07-13 DIAGNOSIS — E559 Vitamin D deficiency, unspecified: Secondary | ICD-10-CM

## 2020-07-13 DIAGNOSIS — Z6839 Body mass index (BMI) 39.0-39.9, adult: Secondary | ICD-10-CM

## 2020-07-13 DIAGNOSIS — I10 Essential (primary) hypertension: Secondary | ICD-10-CM | POA: Diagnosis not present

## 2020-07-13 DIAGNOSIS — E039 Hypothyroidism, unspecified: Secondary | ICD-10-CM

## 2020-07-13 DIAGNOSIS — R310 Gross hematuria: Secondary | ICD-10-CM

## 2020-07-13 LAB — POCT URINALYSIS DIPSTICK
Bilirubin, UA: NEGATIVE
Glucose, UA: NEGATIVE
Ketones, UA: NEGATIVE
Nitrite, UA: NEGATIVE
Protein, UA: NEGATIVE
Spec Grav, UA: >=1.03 — AB (ref 1.010–1.025)
Urobilinogen, UA: 0.2 E.U./dL
pH, UA: 6 (ref 5.0–8.0)

## 2020-07-13 LAB — POCT UA - MICROALBUMIN
Albumin/Creatinine Ratio, Urine, POC: 30
Creatinine, POC: 200 mg/dL
Microalbumin Ur, POC: 30 mg/L

## 2020-07-13 NOTE — Progress Notes (Signed)
Rutherford Nail as a scribe for [name], MD.,have documented all relevant documentation on the behalf of [name], MD,as directed by  [name], MD while in the presence of [name], MD. This visit occurred during the SARS-CoV-2 public health emergency.  Safety protocols were in place, including screening questions prior to the visit, additional usage of staff PPE, and extensive cleaning of exam room while observing appropriate contact time as indicated for disinfecting solutions.  Subjective:     Patient ID: [name] , female    DOB: 1940-12-19 , 80 y.o.   MRN: [id number]   Chief Complaint  Patient presents with  . Annual Exam  . Diabetes  . Hypertension    HPI  She is here today for a full physical examination.  She is no longer followed by GYN. She denies headaches, chest pain and shortness of breath.     Hypertension This is a chronic problem. The current episode started more than 1 year ago. The problem has been gradually improving since onset. The problem is controlled. Pertinent negatives include no blurred vision, chest pain, headaches, palpitations or shortness of breath. Agents associated with hypertension include thyroid hormones. Risk factors for coronary artery disease include dyslipidemia, obesity, post-menopausal state and sedentary lifestyle. Past treatments include angiotensin blockers. The current treatment provides moderate improvement. Compliance problems include exercise.   Diabetes She has type 2 diabetes mellitus. Pertinent negatives for hypoglycemia include no dizziness or headaches. Pertinent negatives for diabetes include no blurred vision, no chest pain, no fatigue, no polydipsia, no polyphagia and no polyuria. She participates in exercise intermittently. An ACE inhibitor/angiotensin II receptor blocker is being taken. Eye exam is current.  c   Past Medical History:  Diagnosis Date  . Arthritis    RIGHT KNEE PAIN AND OA   . Chest pain 05/03/2015  . Diabetes mellitus without complication ([place])   . Elevated cholesterol   . GERD (gastroesophageal reflux disease)   . Hypertension   . Hypothyroidism   . IBS (irritable bowel syndrome)   . Rotator cuff disorder    BOTH SHOULDERS - NO SURGERY - AND STATES LIMITATIONS IN ARM MOVEMENT  . Shortness of breath 05/03/2015  . Swelling    CHRONIC SWELLING RIGHT HAND AND BOTH FEET AND BOTH LEGS  . Vertigo      Family History  Problem Relation Age of Onset  . Diabetes Mother   . Dementia Mother   . Diabetes Father   . Diabetes Sister   . Breast cancer Sister   . Kidney cancer Sister   . Lung cancer Sister   . Lung cancer Maternal Aunt   . Pancreatic cancer Maternal Aunt   . Cancer Other      Current Outpatient Medications:  .  Aspirin (ECOTRIN PO), Take 81 mg by mouth daily. , Disp: , Rfl:  .  BENICAR 20 MG tablet, TAKE 1 TABLET BY MOUTH  DAILY, Disp: 90 tablet, Rfl: 3 .  bimatoprost (LUMIGAN) 0.01 % SOLN, Place 1 drop into both eyes at bedtime. , Disp: , Rfl:  .  CRESTOR 10 MG tablet, TAKE 1 TABLET BY MOUTH  DAILY, Disp: 90 tablet, Rfl: 3 .  diazepam (VALIUM) 2 MG tablet, Take 1 tablet (2 mg total) by mouth every 12 (twelve) hours as needed for anxiety or muscle spasms., Disp: 20 tablet, Rfl: 0 .  famotidine (PEPCID) 20 MG tablet, Take 20 mg by mouth daily., Disp: , Rfl:  .  furosemide (LASIX) 20 MG tablet, TAKE 1 TABLET BY MOUTH IN  THE MORNING, Disp: 90 tablet, Rfl: 3 .  Lifitegrast (XIIDRA) 5 % SOLN, Apply to eye. xiidra, Disp: , Rfl:  .  MAGNESIUM-OXIDE 400 (241.3 Mg) MG tablet, TAKE 1 TABLET BY MOUTH EVERY DAY AFTER SUPPER, Disp: 90 tablet, Rfl: 1 .  Multiple Vitamins-Minerals (CENTRUM SILVER PO), Take 1 tablet by mouth daily. , Disp: , Rfl:  .  Multiple Vitamins-Minerals (EMERGEN-C VITAMIN C PO), Take 1 Package by mouth daily. Uses during the winter, Disp: , Rfl:  .  polyethylene glycol (MIRALAX / GLYCOLAX) packet, Take 17 g by mouth daily., Disp: ,  Rfl:  .  SYNTHROID 75 MCG tablet, TAKE 1 TABLET BY MOUTH  DAILY BEFORE BREAKFAST, Disp: 90 tablet, Rfl: 3 .  vitamin B-6 (PYRIDOXINE) 25 MG tablet, Take 25 mg by mouth daily., Disp: , Rfl:  .  famotidine (PEPCID) 10 MG tablet, Take by mouth., Disp: , Rfl:  .  lidocaine (LIDODERM) 5 %, Place 1 patch onto the skin daily. Remove & Discard patch within 12 hours or as directed by MD, Disp: 30 patch, Rfl: 0 .  naproxen (NAPROSYN) 500 MG tablet, Take 1 tablet (500 mg total) by mouth 2 (two) times daily with a meal., Disp: 30 tablet, Rfl: 0 .  tiZANidine (ZANAFLEX) 2 MG tablet, Take 1 tablet (2 mg total) by mouth every 6 (six) hours as needed., Disp: 30 tablet, Rfl: 0   Allergies  Allergen Reactions  . Codeine Nausea And Vomiting  . Macrobid [Nitrofurantoin Macrocrystal]     RASH  . Tramadol Nausea And Vomiting      The patient states she uses post menopausal status for birth control. Last LMP was No LMP recorded. Patient is postmenopausal.. Negative for Dysmenorrhea. Negative for: breast discharge, breast lump(s), breast pain and breast self exam. Associated symptoms include abnormal vaginal bleeding. Pertinent negatives include abnormal bleeding (hematology), anxiety, decreased libido, depression, difficulty falling sleep, dyspareunia, history of infertility, nocturia, sexual dysfunction, sleep disturbances, urinary incontinence, urinary urgency, vaginal discharge and vaginal itching. Diet regular.The patient states her exercise level is    . The patient's tobacco use is:  Social History   Tobacco Use  Smoking Status Never Smoker  Smokeless Tobacco Never Used  . She has been exposed to passive smoke. The patient's alcohol use is:  Social History   Substance and Sexual Activity  Alcohol Use Not Currently   Comment: OCCAS ALCOHOL   Review of Systems  Constitutional: Negative.  Negative for fatigue.  HENT: Negative.   Eyes: Negative.  Negative for blurred vision.  Respiratory: Negative.   Negative for shortness of breath.   Cardiovascular: Negative.  Negative for chest pain and palpitations.       She does c/o calf pain with walking. States she has difficulty walking on hard surfaces. Adds that she is only able to walk in pool just prior to water aerobics. It causes her too much pain to walk on treadmill/track/    Gastrointestinal: Negative.   Endocrine: Negative.  Negative for polydipsia, polyphagia and polyuria.  Genitourinary: Positive for hematuria.       She reports she awakened with blood in her panties on December 5th. She admits she did not call to notify the office. She has not had any recurrence of sx. She has had hysterectomy. Has h/o hematuria. No longer followed by GYN. She may have had urine leakage. Denies dysuria. No urgency. Also followed by Urology.   Musculoskeletal: Positive  for back pain.  Skin: Negative.   Allergic/Immunologic: Negative.   Neurological: Negative.  Negative for dizziness and headaches.  Hematological: Negative.   Psychiatric/Behavioral: Negative.      Today's Vitals   07/13/20 1031  BP: 136/72  Pulse: 81  Temp: 97.7 F (36.5 C)  TempSrc: Oral  Weight: 216 lb 6.4 oz (98.2 kg)  Height: 5\' 2"  (1.575 m)  PainSc: 0-No pain   Body mass index is 39.58 kg/m.   Objective:  Physical Exam Vitals and nursing note reviewed.  Constitutional:      General: She is not in acute distress.    Appearance: Normal appearance. She is well-developed. She is obese.  HENT:     Head: Normocephalic and atraumatic.     Right Ear: Hearing, tympanic membrane, ear canal and external ear normal. There is no impacted cerumen.     Left Ear: Hearing, tympanic membrane, ear canal and external ear normal. There is no impacted cerumen.     Nose:     Comments: Deferred - masked    Mouth/Throat:     Comments: Deferred - masked Eyes:     General: Lids are normal.     Extraocular Movements: Extraocular movements intact.     Conjunctiva/sclera: Conjunctivae  normal.  Neck:     Thyroid: No thyroid mass.     Vascular: No carotid bruit.  Cardiovascular:     Rate and Rhythm: Normal rate and regular rhythm.     Pulses: Normal pulses.          Dorsalis pedis pulses are 2+ on the right side and 2+ on the left side.     Heart sounds: Normal heart sounds. No murmur heard.   Pulmonary:     Effort: Pulmonary effort is normal.     Breath sounds: Normal breath sounds.  Chest:     Chest wall: No mass.  Breasts:     Tanner Score is 5.     Right: Normal. No mass, tenderness, axillary adenopathy or supraclavicular adenopathy.     Left: Normal. No mass, tenderness, axillary adenopathy or supraclavicular adenopathy.    Abdominal:     General: Bowel sounds are normal. There is no distension.     Palpations: Abdomen is soft.     Tenderness: There is no abdominal tenderness.     Comments: Obese, soft  Genitourinary:    Rectum: Guaiac result negative.     Comments: Deferred per pt Musculoskeletal:        General: Tenderness present. No swelling. Normal range of motion.     Cervical back: Full passive range of motion without pain, normal range of motion and neck supple.     Right lower leg: No edema.     Left lower leg: No edema.  Feet:     Right foot:     Protective Sensation: 5 sites tested. 5 sites sensed.     Skin integrity: Callus and dry skin present.     Toenail Condition: Right toenails are abnormally thick.     Left foot:     Protective Sensation: 5 sites tested. 5 sites sensed.     Skin integrity: Callus and dry skin present.     Toenail Condition: Left toenails are abnormally thick.  Lymphadenopathy:     Upper Body:     Right upper body: No supraclavicular, axillary or pectoral adenopathy.     Left upper body: No supraclavicular, axillary or pectoral adenopathy.  Skin:    General: Skin is warm  and dry.     Capillary Refill: Capillary refill takes less than 2 seconds.  Neurological:     General: No focal deficit present.     Mental  Status: She is alert and oriented to person, place, and time.     Cranial Nerves: No cranial nerve deficit.     Sensory: No sensory deficit.  Psychiatric:        Mood and Affect: Mood normal.        Behavior: Behavior normal.        Thought Content: Thought content normal.        Judgment: Judgment normal.         Assessment And Plan:     1. Routine general medical examination at health care facility Comments: A full exam was performed. Importance of monthly self breast exams was discussed with the patient. PATIENT IS ADVISED TO GET 30-45 MINUTES REGULAR EXERCISE NO LESS THAN FOUR TO FIVE DAYS PER WEEK - BOTH WEIGHTBEARING EXERCISES AND AEROBIC ARE RECOMMENDED.  PATIENT IS ADVISED TO FOLLOW A HEALTHY DIET WITH AT LEAST SIX FRUITS/VEGGIES PER DAY, DECREASE INTAKE OF RED MEAT, AND TO INCREASE FISH INTAKE TO TWO DAYS PER WEEK.  MEATS/FISH SHOULD NOT BE FRIED, BAKED OR BROILED IS PREFERABLE.  I SUGGEST WEARING SPF 50 SUNSCREEN ON EXPOSED PARTS AND ESPECIALLY WHEN IN THE DIRECT SUNLIGHT FOR AN EXTENDED PERIOD OF TIME.  PLEASE AVOID FAST FOOD RESTAURANTS AND INCREASE YOUR WATER INTAKE.;t  2. Essential hypertension Comments: Chronic, fair control. She is aware that optimal BP is less than 130/80.  She is encouraged to follow low sodium diet, no more than $RemoveB'1500mg'apCaHbuH$ /day.  EKG performed, NSR w/ LAFB, voltage criteria for LVH and nonspecific T abnormality.  She will return to office for f/u in four months for re-evaluation.  - POCT Urinalysis Dipstick (81002) - POCT UA - Microalbumin - EKG 12-Lead - CMP14+EGFR - CBC - Hemoglobin A1c  3. Uncontrolled type 2 diabetes mellitus with hyperglycemia ([place]) Comments: Diabetic foot exam was performed. I DISCUSSED WITH THE PATIENT AT LENGTH REGARDING THE GOALS OF GLYCEMIC CONTROL AND POSSIBLE LONG-TERM COMPLICATIONS.  I  ALSO STRESSED THE IMPORTANCE OF COMPLIANCE WITH HOME GLUCOSE MONITORING, DIETARY RESTRICTIONS INCLUDING AVOIDANCE OF SUGARY DRINKS/PROCESSED  FOODS,  ALONG WITH REGULAR EXERCISE.  I  ALSO STRESSED THE IMPORTANCE OF ANNUAL EYE EXAMS, SELF FOOT CARE AND COMPLIANCE WITH OFFICE VISITS.  4. Gross hematuria Comments: I will check u/a today. She is also followed by Urology. She will let me know if her sx recur.   5. Multilevel spondylosis Comments: Chronic. Importance of core strengthening was discussed with patient. She is also encouraged to follow an anti-inflammatory diet. She agrees to PT referral.  - Ambulatory referral to Physical Therapy  6. 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 However, due to cardiovascular risk factors, I will also refer her for ABIs.  - VAS [place] ABI WITH/WO TBI; Future  7. Primary hypothyroidism Comments: I will check thyroid panel and adjust meds as needed.  - TSH - T4, free  8. Pure hypercholesterolemia Chronic. She is encouraged to continue with statin therapy. She is encouraged to avoid fried foods, decrease meat intake and to increase fiber intake.   9. Vitamin D deficiency disease Comments: I will check vitamin D level and supplement as needed.  - VITAMIN D 25 Hydroxy (Vit-D Deficiency, Fractures)  10. Class 2 severe obesity due to excess calories with serious comorbidity and body mass index (BMI) of 39.0 to  39.9 in adult [facility]) Comments: She is encouraged to aim for at least 150 minutes of exercise per week. Also advised to aim for BMI less than 32 to decrease cardiac risk.   Patient was given opportunity to ask questions. Patient verbalized understanding of the plan and was able to repeat key elements of the plan. All questions were answered to their satisfaction.  [name], MD   I, [name], MD, have reviewed all documentation for this visit. The documentation on 07/20/20 for the exam, diagnosis, procedures, and orders are all accurate and complete.  THE PATIENT IS ENCOURAGED TO PRACTICE SOCIAL DISTANCING DUE TO THE COVID-19 PANDEMIC.

## 2020-07-13 NOTE — Patient Instructions (Signed)
Health Maintenance, Female Adopting a healthy lifestyle and getting preventive care are important in promoting health and wellness. Ask your health care provider about:  The right schedule for you to have regular tests and exams.  Things you can do on your own to prevent diseases and keep yourself healthy. What should I know about diet, weight, and exercise? Eat a healthy diet   Eat a diet that includes plenty of vegetables, fruits, low-fat dairy products, and lean protein.  Do not eat a lot of foods that are high in solid fats, added sugars, or sodium. Maintain a healthy weight Body mass index (BMI) is used to identify weight problems. It estimates body fat based on height and weight. Your health care provider can help determine your BMI and help you achieve or maintain a healthy weight. Get regular exercise Get regular exercise. This is one of the most important things you can do for your health. Most adults should:  Exercise for at least 150 minutes each week. The exercise should increase your heart rate and make you sweat (moderate-intensity exercise).  Do strengthening exercises at least twice a week. This is in addition to the moderate-intensity exercise.  Spend less time sitting. Even light physical activity can be beneficial. Watch cholesterol and blood lipids Have your blood tested for lipids and cholesterol at 82 years of age, then have this test every 5 years. Have your cholesterol levels checked more often if:  Your lipid or cholesterol levels are high.  You are older than 82 years of age.  You are at high risk for heart disease. What should I know about cancer screening? Depending on your health history and family history, you may need to have cancer screening at various ages. This may include screening for:  Breast cancer.  Cervical cancer.  Colorectal cancer.  Skin cancer.  Lung cancer. What should I know about heart disease, diabetes, and high blood  pressure? Blood pressure and heart disease  High blood pressure causes heart disease and increases the risk of stroke. This is more likely to develop in people who have high blood pressure readings, are of African descent, or are overweight.  Have your blood pressure checked: ? Every 3-5 years if you are 18-39 years of age. ? Every year if you are 40 years old or older. Diabetes Have regular diabetes screenings. This checks your fasting blood sugar level. Have the screening done:  Once every three years after age 40 if you are at a normal weight and have a low risk for diabetes.  More often and at a younger age if you are overweight or have a high risk for diabetes. What should I know about preventing infection? Hepatitis B If you have a higher risk for hepatitis B, you should be screened for this virus. Talk with your health care provider to find out if you are at risk for hepatitis B infection. Hepatitis C Testing is recommended for:  Everyone born from 1945 through 1965.  Anyone with known risk factors for hepatitis C. Sexually transmitted infections (STIs)  Get screened for STIs, including gonorrhea and chlamydia, if: ? You are sexually active and are younger than 82 years of age. ? You are older than 82 years of age and your health care provider tells you that you are at risk for this type of infection. ? Your sexual activity has changed since you were last screened, and you are at increased risk for chlamydia or gonorrhea. Ask your health care provider if   you are at risk.  Ask your health care provider about whether you are at high risk for HIV. Your health care provider may recommend a prescription medicine to help prevent HIV infection. If you choose to take medicine to prevent HIV, you should first get tested for HIV. You should then be tested every 3 months for as long as you are taking the medicine. Pregnancy  If you are about to stop having your period (premenopausal) and  you may become pregnant, seek counseling before you get pregnant.  Take 400 to 800 micrograms (mcg) of folic acid every day if you become pregnant.  Ask for birth control (contraception) if you want to prevent pregnancy. Osteoporosis and menopause Osteoporosis is a disease in which the bones lose minerals and strength with aging. This can result in bone fractures. If you are 65 years old or older, or if you are at risk for osteoporosis and fractures, ask your health care provider if you should:  Be screened for bone loss.  Take a calcium or vitamin D supplement to lower your risk of fractures.  Be given hormone replacement therapy (HRT) to treat symptoms of menopause. Follow these instructions at home: Lifestyle  Do not use any products that contain nicotine or tobacco, such as cigarettes, e-cigarettes, and chewing tobacco. If you need help quitting, ask your health care provider.  Do not use street drugs.  Do not share needles.  Ask your health care provider for help if you need support or information about quitting drugs. Alcohol use  Do not drink alcohol if: ? Your health care provider tells you not to drink. ? You are pregnant, may be pregnant, or are planning to become pregnant.  If you drink alcohol: ? Limit how much you use to 0-1 drink a day. ? Limit intake if you are breastfeeding.  Be aware of how much alcohol is in your drink. In the [place] one drink equals one 12 oz bottle of beer (355 mL), one 5 oz glass of wine (148 mL), or one 1 oz glass of hard liquor (44 mL). General instructions  Schedule regular health, dental, and eye exams.  Stay current with your vaccines.  Tell your health care provider if: ? You often feel depressed. ? You have ever been abused or do not feel safe at home. Summary  Adopting a healthy lifestyle and getting preventive care are important in promoting health and wellness.  Follow your health care provider's instructions about healthy  diet, exercising, and getting tested or screened for diseases.  Follow your health care provider's instructions on monitoring your cholesterol and blood pressure. This information is not intended to replace advice given to you by your health care provider. Make sure you discuss any questions you have with your health care provider. Document Revised: 06/18/2018 Document Reviewed: 06/18/2018 Elsevier Patient Education  2020 [employer]  

## 2020-07-14 LAB — CMP14+EGFR
ALT: 44 IU/L — ABNORMAL HIGH (ref 0–32)
AST: 33 IU/L (ref 0–40)
Albumin/Globulin Ratio: 2.2 (ref 1.2–2.2)
Albumin: 4.4 g/dL (ref 3.6–4.6)
Alkaline Phosphatase: 130 IU/L — ABNORMAL HIGH (ref 44–121)
BUN/Creatinine Ratio: 21 (ref 12–28)
BUN: 15 mg/dL (ref 8–27)
Bilirubin Total: 0.6 mg/dL (ref 0.0–1.2)
CO2: 26 mmol/L (ref 20–29)
Calcium: 11.1 mg/dL — ABNORMAL HIGH (ref 8.7–10.3)
Chloride: 104 mmol/L (ref 96–106)
Creatinine, Ser: 0.71 mg/dL (ref 0.57–1.00)
GFR calc Af Amer: 92 mL/min/{1.73_m2} (ref >59)
GFR calc non Af Amer: 80 mL/min/{1.73_m2} (ref >59)
Globulin, Total: 2 g/dL (ref 1.5–4.5)
Glucose: 103 mg/dL — ABNORMAL HIGH (ref 65–99)
Potassium: 4.7 mmol/L (ref 3.5–5.2)
Sodium: 143 mmol/L (ref 134–144)
Total Protein: 6.4 g/dL (ref 6.0–8.5)

## 2020-07-14 LAB — CBC
Hematocrit: 45.6 % (ref 34.0–46.6)
Hemoglobin: 15.4 g/dL (ref 11.1–15.9)
MCH: 30.5 pg (ref 26.6–33.0)
MCHC: 33.8 g/dL (ref 31.5–35.7)
MCV: 90 fL (ref 79–97)
Platelets: 181 10*3/uL (ref 150–450)
RBC: 5.05 x10E6/uL (ref 3.77–5.28)
RDW: 13.1 % (ref 11.7–15.4)
WBC: 4.9 10*3/uL (ref 3.4–10.8)

## 2020-07-14 LAB — TSH: TSH: 0.865 u[IU]/mL (ref 0.450–4.500)

## 2020-07-14 LAB — T4, FREE: Free T4: 1.73 ng/dL (ref 0.82–1.77)

## 2020-07-14 LAB — VITAMIN D 25 HYDROXY (VIT D DEFICIENCY, FRACTURES): Vit D, 25-Hydroxy: 49.1 ng/mL (ref 30.0–100.0)

## 2020-07-14 LAB — HEMOGLOBIN A1C
Est. average glucose Bld gHb Est-mCnc: 143 mg/dL
Hgb A1c MFr Bld: 6.6 % — ABNORMAL HIGH (ref 4.8–5.6)

## 2020-07-17 ENCOUNTER — Emergency Department (HOSPITAL_BASED_OUTPATIENT_CLINIC_OR_DEPARTMENT_OTHER)
Admission: EM | Admit: 2020-07-17 | Discharge: 2020-07-17 | Disposition: A | Discharge status: Home / Self Care | Payer: Medicare Other | Attending: Emergency Medicine | Admitting: Emergency Medicine

## 2020-07-17 ENCOUNTER — Encounter (HOSPITAL_BASED_OUTPATIENT_CLINIC_OR_DEPARTMENT_OTHER): Payer: Self-pay

## 2020-07-17 ENCOUNTER — Other Ambulatory Visit: Payer: Self-pay

## 2020-07-17 ENCOUNTER — Emergency Department (HOSPITAL_BASED_OUTPATIENT_CLINIC_OR_DEPARTMENT_OTHER): Payer: Medicare Other

## 2020-07-17 DIAGNOSIS — E1169 Type 2 diabetes mellitus with other specified complication: Secondary | ICD-10-CM | POA: Diagnosis not present

## 2020-07-17 DIAGNOSIS — Z79899 Other long term (current) drug therapy: Secondary | ICD-10-CM | POA: Diagnosis not present

## 2020-07-17 DIAGNOSIS — M5136 Other intervertebral disc degeneration, lumbar region: Secondary | ICD-10-CM | POA: Diagnosis not present

## 2020-07-17 DIAGNOSIS — Z90710 Acquired absence of both cervix and uterus: Secondary | ICD-10-CM | POA: Insufficient documentation

## 2020-07-17 DIAGNOSIS — M791 Myalgia, unspecified site: Secondary | ICD-10-CM | POA: Diagnosis not present

## 2020-07-17 DIAGNOSIS — Z96651 Presence of right artificial knee joint: Secondary | ICD-10-CM | POA: Insufficient documentation

## 2020-07-17 DIAGNOSIS — Z7982 Long term (current) use of aspirin: Secondary | ICD-10-CM | POA: Insufficient documentation

## 2020-07-17 DIAGNOSIS — E039 Hypothyroidism, unspecified: Secondary | ICD-10-CM | POA: Diagnosis not present

## 2020-07-17 DIAGNOSIS — N390 Urinary tract infection, site not specified: Secondary | ICD-10-CM

## 2020-07-17 DIAGNOSIS — M549 Dorsalgia, unspecified: Secondary | ICD-10-CM

## 2020-07-17 DIAGNOSIS — Z9049 Acquired absence of other specified parts of digestive tract: Secondary | ICD-10-CM | POA: Insufficient documentation

## 2020-07-17 DIAGNOSIS — I10 Essential (primary) hypertension: Secondary | ICD-10-CM | POA: Insufficient documentation

## 2020-07-17 DIAGNOSIS — E785 Hyperlipidemia, unspecified: Secondary | ICD-10-CM | POA: Insufficient documentation

## 2020-07-17 DIAGNOSIS — R109 Unspecified abdominal pain: Secondary | ICD-10-CM | POA: Diagnosis present

## 2020-07-17 LAB — URINALYSIS, ROUTINE W REFLEX MICROSCOPIC
Bilirubin Urine: NEGATIVE
Glucose, UA: NEGATIVE mg/dL
Ketones, ur: NEGATIVE mg/dL
Leukocytes,Ua: NEGATIVE
Nitrite: NEGATIVE
Protein, ur: NEGATIVE mg/dL
Specific Gravity, Urine: 1.025 (ref 1.005–1.030)
pH: 6 (ref 5.0–8.0)

## 2020-07-17 LAB — URINALYSIS, MICROSCOPIC (REFLEX)

## 2020-07-17 MED ORDER — NAPROXEN 500 MG PO TABS: 500.0000 mg | ORAL_TABLET | Freq: Two times a day (BID) | ORAL | 0 refills | Status: DC

## 2020-07-17 MED ORDER — LIDOCAINE 5 % EX PTCH: 1.0000 | MEDICATED_PATCH | CUTANEOUS | 0 refills | Status: DC

## 2020-07-17 MED ORDER — KETOROLAC TROMETHAMINE 60 MG/2ML IM SOLN
60.0000 mg | Freq: Once | INTRAMUSCULAR | Status: AC
  Administered 2020-07-17: 60 mg via INTRAMUSCULAR
  Filled 2020-07-17: qty 2

## 2020-07-17 MED ORDER — PREDNISONE 10 MG PO TABS: 40.0000 mg | ORAL_TABLET | Freq: Every day | ORAL | 0 refills | Status: AC

## 2020-07-17 MED ORDER — TIZANIDINE HCL 2 MG PO TABS: 2.0000 mg | ORAL_TABLET | Freq: Four times a day (QID) | ORAL | 0 refills | Status: DC | PRN

## 2020-07-17 MED ORDER — CEPHALEXIN 500 MG PO CAPS: 500.0000 mg | ORAL_CAPSULE | Freq: Three times a day (TID) | ORAL | 0 refills | Status: AC
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
## 2020-07-18 LAB — URINE CULTURE

## 2020-07-25 ENCOUNTER — Encounter: Payer: Self-pay | Admitting: Internal Medicine

## 2020-08-01 ENCOUNTER — Other Ambulatory Visit: Payer: Medicare Other

## 2020-08-01 ENCOUNTER — Other Ambulatory Visit: Payer: Self-pay

## 2020-08-01 DIAGNOSIS — R8290 Unspecified abnormal findings in urine: Secondary | ICD-10-CM

## 2020-08-02 ENCOUNTER — Ambulatory Visit (HOSPITAL_COMMUNITY)
Admission: RE | Admit: 2020-08-02 | Discharge: 2020-08-02 | Disposition: A | Discharge status: Home / Self Care | Payer: Medicare Other | Source: Ambulatory Visit | Attending: Internal Medicine | Admitting: Internal Medicine

## 2020-08-02 DIAGNOSIS — I739 Peripheral vascular disease, unspecified: Secondary | ICD-10-CM | POA: Diagnosis not present

## 2020-09-01 LAB — HM DIABETES EYE EXAM

## 2020-09-14 ENCOUNTER — Ambulatory Visit (INDEPENDENT_AMBULATORY_CARE_PROVIDER_SITE_OTHER): Payer: Medicare Other | Admitting: Plastic Surgery

## 2020-09-14 ENCOUNTER — Other Ambulatory Visit: Payer: Self-pay

## 2020-09-14 ENCOUNTER — Encounter: Payer: Self-pay | Admitting: Plastic Surgery

## 2020-09-14 VITALS — BP 150/69 | HR 72 | Ht 63.0 in | Wt 217.4 lb

## 2020-09-14 DIAGNOSIS — L989 Disorder of the skin and subcutaneous tissue, unspecified: Secondary | ICD-10-CM

## 2020-09-14 NOTE — Progress Notes (Signed)
Referring Provider Glendale Chard, [address] [place],  [place] [address]   CC:  Chief Complaint  Patient presents with  . consult      [name] is an 80 y.o. female.  HPI: Patient presents to discuss a few bothersome skin lesions.  There is 1 in the posterior auricular sulcus behind her left ear.  There is another group in the crease beneath her abdominal pannus.  They both itch and bother her and she is interested in having them removed.  Allergies  Allergen Reactions  . Codeine Nausea And Vomiting  . Macrobid [Nitrofurantoin Macrocrystal]     RASH  . Tramadol Nausea And Vomiting    Outpatient Encounter Medications as of 09/14/2020  Medication Sig  . Aspirin (ECOTRIN PO) Take 81 mg by mouth daily.   Marland Kitchen BENICAR 20 MG tablet TAKE 1 TABLET BY MOUTH  DAILY  . bimatoprost (LUMIGAN) 0.01 % SOLN Place 1 drop into both eyes at bedtime.   . CRESTOR 10 MG tablet TAKE 1 TABLET BY MOUTH  DAILY  . famotidine (PEPCID) 10 MG tablet Take by mouth.  . furosemide (LASIX) 20 MG tablet TAKE 1 TABLET BY MOUTH IN  THE MORNING  . Lifitegrast (XIIDRA) 5 % SOLN Apply to eye. xiidra  . MAGNESIUM-OXIDE 400 (241.3 Mg) MG tablet TAKE 1 TABLET BY MOUTH EVERY DAY AFTER SUPPER  . Multiple Vitamins-Minerals (CENTRUM SILVER PO) Take 1 tablet by mouth daily.   . Multiple Vitamins-Minerals (EMERGEN-C VITAMIN C PO) Take 1 Package by mouth daily. Uses during the winter  . polyethylene glycol (MIRALAX / GLYCOLAX) packet Take 17 g by mouth daily.  Marland Kitchen SYNTHROID 75 MCG tablet TAKE 1 TABLET BY MOUTH  DAILY BEFORE BREAKFAST  . tiZANidine (ZANAFLEX) 2 MG tablet Take 1 tablet (2 mg total) by mouth every 6 (six) hours as needed.  . vitamin B-6 (PYRIDOXINE) 25 MG tablet Take 25 mg by mouth daily.  . diazepam (VALIUM) 2 MG tablet Take 1 tablet (2 mg total) by mouth every 12 (twelve) hours as needed for anxiety or muscle spasms.  . famotidine (PEPCID) 20 MG tablet Take 20 mg by mouth daily.  Marland Kitchen lidocaine  (LIDODERM) 5 % Place 1 patch onto the skin daily. Remove & Discard patch within 12 hours or as directed by MD  . naproxen (NAPROSYN) 500 MG tablet Take 1 tablet (500 mg total) by mouth 2 (two) times daily with a meal.   No facility-administered encounter medications on file as of 09/14/2020.     Past Medical History:  Diagnosis Date  . Arthritis    RIGHT KNEE PAIN AND OA  . Chest pain 05/03/2015  . Diabetes mellitus without complication ([place])   . Elevated cholesterol   . GERD (gastroesophageal reflux disease)   . Hypertension   . Hypothyroidism   . IBS (irritable bowel syndrome)   . Rotator cuff disorder    BOTH SHOULDERS - NO SURGERY - AND STATES LIMITATIONS IN ARM MOVEMENT  . Shortness of breath 05/03/2015  . Swelling    CHRONIC SWELLING RIGHT HAND AND BOTH FEET AND BOTH LEGS  . Vertigo     Past Surgical History:  Procedure Laterality Date  . ABDOMINAL HYSTERECTOMY  1985  . BREAST SURGERY  1985   RIGHT BREAST BIOPSY - BENIGN  . CARPAL TUNNEL RELEASE Left 02/05/2019   Procedure: LEFT CARPAL TUNNEL RELEASE;  Surgeon: Daryll Brod, MD;  Location: [facility];  Service: Orthopedics;  Laterality: Left;  .  CHOLECYSTECTOMY  1975  . EYE SURGERY  1995   BILATERAL CATARACT EXTRACTIONS; SURGERY FOR MACULAR HOLE   . [place]  . 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   Procedure: RIGHT TOTAL KNEE ARTHROPLASTY;  Surgeon: Gearlean Alf, MD;  Location[facility];  Service: Orthopedics;  Laterality: Right;    Family History  Problem Relation Age of Onset  . Diabetes Mother   . Dementia Mother   . Diabetes Father   . Diabetes Sister   . Breast cancer Sister   . Kidney cancer Sister   . Lung cancer Sister   . Lung cancer Maternal Aunt   . Pancreatic cancer Maternal Aunt   . Cancer Other     Social History   Social History Narrative   [name]   Patient is retired.    Patient has 4 children.    Patient has a high school  education.    Patient is widowed   Right handed      Epworth Sleepiness Scale = 1 (as of 05/03/2015)     Review of Systems General: Denies fevers, chills, weight loss CV: Denies chest pain, shortness of breath, palpitations  Physical Exam Vitals with BMI 09/14/2020 07/17/2020 07/17/2020  Height 5\' 3"  - 5\' 3"   Weight 217 lbs 6 oz - 216 lbs  BMI 16.38 - 46.65  Systolic 993 570 177  Diastolic 69 66 50  Pulse 72 56 79    General:  No acute distress,  Alert and oriented, Non-Toxic, Normal speech and affect Examination shows a 2 cm pigmented irregular lesion in the postauricular sulcus superiorly behind the left ear.  In the infra pannus crease there are numerous pigmented nevi that look like seborrheic keratoses.  Assessment/Plan Patient presents with symptomatic benign-appearing pigmented lesions one behind her left ear and the other beneath her abdominal pannus.  I think it is reasonable to excise these given that they are symptomatic.  We discussed the risks include bleeding, infection, damage to surrounding structures and need for additional procedures.  All her questions were answered and we will plan to excise under local in the office.  Cindra Presume 09/14/2020, 3:15 PM

## 2020-10-04 ENCOUNTER — Other Ambulatory Visit: Payer: Self-pay | Admitting: Internal Medicine

## 2020-10-07 ENCOUNTER — Emergency Department (HOSPITAL_BASED_OUTPATIENT_CLINIC_OR_DEPARTMENT_OTHER)
Admission: EM | Admit: 2020-10-07 | Discharge: 2020-10-07 | Disposition: A | Discharge status: Home / Self Care | Payer: Medicare Other | Attending: Emergency Medicine | Admitting: Emergency Medicine

## 2020-10-07 ENCOUNTER — Encounter (HOSPITAL_BASED_OUTPATIENT_CLINIC_OR_DEPARTMENT_OTHER): Payer: Self-pay | Admitting: *Deleted

## 2020-10-07 ENCOUNTER — Other Ambulatory Visit: Payer: Self-pay

## 2020-10-07 ENCOUNTER — Emergency Department (HOSPITAL_BASED_OUTPATIENT_CLINIC_OR_DEPARTMENT_OTHER): Payer: Medicare Other

## 2020-10-07 DIAGNOSIS — E1169 Type 2 diabetes mellitus with other specified complication: Secondary | ICD-10-CM | POA: Diagnosis not present

## 2020-10-07 DIAGNOSIS — Z96651 Presence of right artificial knee joint: Secondary | ICD-10-CM | POA: Diagnosis not present

## 2020-10-07 DIAGNOSIS — Z20822 Contact with and (suspected) exposure to covid-19: Secondary | ICD-10-CM | POA: Insufficient documentation

## 2020-10-07 DIAGNOSIS — E785 Hyperlipidemia, unspecified: Secondary | ICD-10-CM | POA: Diagnosis not present

## 2020-10-07 DIAGNOSIS — I10 Essential (primary) hypertension: Secondary | ICD-10-CM | POA: Insufficient documentation

## 2020-10-07 DIAGNOSIS — E039 Hypothyroidism, unspecified: Secondary | ICD-10-CM | POA: Diagnosis not present

## 2020-10-07 DIAGNOSIS — Z7982 Long term (current) use of aspirin: Secondary | ICD-10-CM | POA: Diagnosis not present

## 2020-10-07 DIAGNOSIS — Z79899 Other long term (current) drug therapy: Secondary | ICD-10-CM | POA: Insufficient documentation

## 2020-10-07 DIAGNOSIS — R0981 Nasal congestion: Secondary | ICD-10-CM | POA: Diagnosis present

## 2020-10-07 DIAGNOSIS — J069 Acute upper respiratory infection, unspecified: Secondary | ICD-10-CM

## 2020-10-07 DIAGNOSIS — R6889 Other general symptoms and signs: Secondary | ICD-10-CM

## 2020-10-07 LAB — CBC
HCT: 43.3 % (ref 36.0–46.0)
Hemoglobin: 14.6 g/dL (ref 12.0–15.0)
MCH: 30.8 pg (ref 26.0–34.0)
MCHC: 33.7 g/dL (ref 30.0–36.0)
MCV: 91.4 fL (ref 80.0–100.0)
Platelets: 162 10*3/uL (ref 150–400)
RBC: 4.74 MIL/uL (ref 3.87–5.11)
RDW: 13.6 % (ref 11.5–15.5)
WBC: 5.7 10*3/uL (ref 4.0–10.5)
nRBC: 0 % (ref 0.0–0.2)

## 2020-10-07 LAB — BASIC METABOLIC PANEL
Anion gap: 5 (ref 5–15)
BUN: 17 mg/dL (ref 8–23)
CO2: 27 mmol/L (ref 22–32)
Calcium: 10.3 mg/dL (ref 8.9–10.3)
Chloride: 105 mmol/L (ref 98–111)
Creatinine, Ser: 0.58 mg/dL (ref 0.44–1.00)
GFR, Estimated: >60 mL/min (ref >60)
Glucose, Bld: 114 mg/dL — ABNORMAL HIGH (ref 70–99)
Potassium: 4 mmol/L (ref 3.5–5.1)
Sodium: 137 mmol/L (ref 135–145)

## 2020-10-07 MED ORDER — BENZONATATE 100 MG PO CAPS: 100.0000 mg | ORAL_CAPSULE | Freq: Three times a day (TID) | ORAL | 0 refills | Status: DC

## 2020-10-07 MED ORDER — GUAIFENESIN ER 1200 MG PO TB12: 1.0000 | ORAL_TABLET | Freq: Two times a day (BID) | ORAL | 0 refills | Status: DC

## 2020-10-07 NOTE — Discharge Instructions (Addendum)
Take Tylenol as needed for aches and pains.  Follow-up with your doctor next week if your symptoms are not improving.  Return to the ER for worsening symptoms such as high fevers or shortness of breath

## 2020-10-07 NOTE — ED Triage Notes (Signed)
Body aches, runny nose and sore throat x 3 days.

## 2020-10-07 NOTE — ED Provider Notes (Signed)
[facility] EMERGENCY DEPARTMENT Provider Note   CSN: [id number] Arrival date & time: 10/07/20  1515     History Chief complaint: URI symptoms.  [name] is a 80 y.o. female.  HPI   Patient states she started having symptoms a few days ago.  She began having some body aches as well as nasal congestion and a sore throat.  She has had a slight cough as well.  She does think that a little harder for her to breathe.  She is not having any fevers or chills.  She has not had any bloody sputum.  She has not noticed any leg swelling.  Patient has been vaccinated for COVID-19  Past Medical History:  Diagnosis Date  . Arthritis    RIGHT KNEE PAIN AND OA  . Chest pain 05/03/2015  . Diabetes mellitus without complication ([place])   . Elevated cholesterol   . GERD (gastroesophageal reflux disease)   . Hypertension   . Hypothyroidism   . IBS (irritable bowel syndrome)   . Rotator cuff disorder    BOTH SHOULDERS - NO SURGERY - AND STATES LIMITATIONS IN ARM MOVEMENT  . Shortness of breath 05/03/2015  . Swelling    CHRONIC SWELLING RIGHT HAND AND BOTH FEET AND BOTH LEGS  . Vertigo     Patient Active Problem List   Diagnosis Date Noted  . Class 2 severe obesity due to excess calories with serious comorbidity and body mass index (BMI) of 39.0 to 39.9 in adult [facility]) 07/13/2020  . Hypothyroidism 04/06/2018  . Hyperlipidemia 04/06/2018  . Essential hypertension 04/06/2018  . Abnormal glucose 04/06/2018  . Chest pain 05/03/2015  . Shortness of breath 05/03/2015  . Dizziness and giddiness 05/18/2014  . 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 Vertigo 01/06/2014  . Postoperative anemia due to acute blood loss 12/30/2012  . OA (osteoarthritis) of knee 12/29/2012    Past Surgical History:  Procedure Laterality Date  . ABDOMINAL HYSTERECTOMY  1985  . BREAST SURGERY  1985   RIGHT BREAST BIOPSY - BENIGN  . CARPAL TUNNEL RELEASE Left 02/05/2019   Procedure: LEFT CARPAL TUNNEL RELEASE;   Surgeon: Daryll Brod, MD;  Location: [facility];  Service: Orthopedics;  Laterality: Left;  . CHOLECYSTECTOMY  1975  . EYE SURGERY  1995   BILATERAL CATARACT EXTRACTIONS; SURGERY FOR MACULAR HOLE   . [place]  . 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     Family History  Problem Relation Age of Onset  . Diabetes Mother   . Dementia Mother   . Diabetes Father   . Diabetes Sister   . Breast cancer Sister   . Kidney cancer Sister   . Lung cancer Sister   . Lung cancer Maternal Aunt   . Pancreatic cancer Maternal Aunt   . Cancer Other     Social History   Tobacco Use  . Smoking status: Never Smoker  . Smokeless tobacco: Never Used  Vaping Use  . Vaping Use: Never used  Substance Use Topics  . Alcohol use: Not Currently    Comment: OCCAS ALCOHOL  . Drug use: No    Home Medications Prior to Admission medications   Medication Sig Start Date End Date Taking? Authorizing Provider  Aspirin (ECOTRIN PO) Take 81  mg by mouth daily.    Yes [provider]  BENICAR 20 MG tablet TAKE 1 TABLET BY MOUTH  DAILY 03/15/20  Yes Glendale Chard, MD  benzonatate (TESSALON) 100 MG capsule Take 1 capsule (100 mg total) by mouth every 8 (eight) hours. 10/07/20  Yes Dorie Rank, MD  bimatoprost (LUMIGAN) 0.01 % SOLN Place 1 drop into both eyes at bedtime.    Yes [provider]  CRESTOR 10 MG tablet TAKE 1 TABLET BY MOUTH  DAILY 03/15/20  Yes Glendale Chard, MD  famotidine (PEPCID) 10 MG tablet Take by mouth.   Yes [provider]  furosemide (LASIX) 20 MG tablet TAKE 1 TABLET BY MOUTH IN  THE MORNING 12/21/19  Yes Glendale Chard, MD  Guaifenesin 1200 MG TB12 Take 1 tablet (1,200 mg total) by mouth 2 (two) times daily at 10 AM and 5 PM. 10/07/20  Yes Dorie Rank, MD   Lifitegrast Shirley Friar) 5 % SOLN Apply to eye. xiidra   Yes [provider]  MAGNESIUM-OXIDE 400 (241.3 Mg) MG tablet TAKE 1 TABLET BY MOUTH EVERY DAY AFTER SUPPER 12/28/19  Yes Glendale Chard, MD  Multiple Vitamins-Minerals (CENTRUM SILVER PO) Take 1 tablet by mouth daily.    Yes [provider]  Multiple Vitamins-Minerals (EMERGEN-C VITAMIN C PO) Take 1 Package by mouth daily. Uses during the winter   Yes [provider]  polyethylene glycol (MIRALAX / GLYCOLAX) packet Take 17 g by mouth daily.   Yes [provider]  SYNTHROID 75 MCG tablet TAKE 1 TABLET BY MOUTH  DAILY BEFORE BREAKFAST 10/04/20  Yes Glendale Chard, MD  vitamin B-6 (PYRIDOXINE) 25 MG tablet Take 25 mg by mouth daily.   Yes [provider]  diazepam (VALIUM) 2 MG tablet Take 1 tablet (2 mg total) by mouth every 12 (twelve) hours as needed for anxiety or muscle spasms. 12/23/19 12/22/20  Glendale Chard, MD  famotidine (PEPCID) 20 MG tablet Take 20 mg by mouth daily.    [provider]  lidocaine (LIDODERM) 5 % Place 1 patch onto the skin daily. Remove & Discard patch within 12 hours or as directed by MD 07/17/20   Gareth Morgan, MD  naproxen (NAPROSYN) 500 MG tablet Take 1 tablet (500 mg total) by mouth 2 (two) times daily with a meal. 07/17/20   Gareth Morgan, MD  tiZANidine (ZANAFLEX) 2 MG tablet Take 1 tablet (2 mg total) by mouth every 6 (six) hours as needed. 07/17/20   Gareth Morgan, MD    Allergies    Codeine, Macrobid [nitrofurantoin macrocrystal], and Tramadol  Review of Systems   Review of Systems  All other systems reviewed and are negative.   Physical Exam Updated Vital Signs BP 107/76 (BP Location: Right Arm)   Pulse 83   Temp 97.7 F (36.5 C) (Oral)   Resp 20   Ht 1.6 m (5\' 3" )   Wt 99.1 kg   SpO2 98%   BMI 38.71 kg/m   Physical Exam Vitals and nursing note reviewed.  Constitutional:      General: She is not in acute distress.    Appearance: She  is well-developed.  HENT:     Head: Normocephalic and atraumatic.     Right Ear: External ear normal.     Left Ear: External ear normal.     Mouth/Throat:     Pharynx: No oropharyngeal exudate or posterior oropharyngeal erythema.     Comments: Mild erythema left ear Eyes:     General: No  scleral icterus.       Right eye: No discharge.        Left eye: No discharge.     Conjunctiva/sclera: Conjunctivae normal.  Neck:     Trachea: No tracheal deviation.  Cardiovascular:     Rate and Rhythm: Normal rate and regular rhythm.  Pulmonary:     Effort: Pulmonary effort is normal. No respiratory distress.     Breath sounds: Normal breath sounds. No stridor. No wheezing or rales.  Abdominal:     General: Bowel sounds are normal. There is no distension.     Palpations: Abdomen is soft.     Tenderness: There is no abdominal tenderness. There is no guarding or rebound.  Musculoskeletal:        General: No tenderness.     Cervical back: Neck supple.  Skin:    General: Skin is warm and dry.     Findings: No rash.  Neurological:     Mental Status: She is alert.     Cranial Nerves: No cranial nerve deficit (no facial droop, extraocular movements intact, no slurred speech).     Sensory: No sensory deficit.     Motor: No abnormal muscle tone or seizure activity.     Coordination: Coordination normal.     ED Results / Procedures / Treatments   Labs (all labs ordered are listed, but only abnormal results are displayed) Labs Reviewed  BASIC METABOLIC PANEL - Abnormal; Notable for the following components:      Result Value   Glucose, Bld 114 (*)    All other components within normal limits  SARS CORONAVIRUS 2 (TAT 6-24 HRS)  CBC    EKG None  Radiology DG Chest Port 1 View  Result Date: 10/07/2020 CLINICAL DATA:  Body ache runny nose sore throat EXAM: PORTABLE CHEST 1 VIEW COMPARISON:  12/18/2012 FINDINGS: No consolidation or pleural effusion. Mild bronchitic changes.  Cardiomediastinal silhouette upper limits of normal. No pneumothorax. IMPRESSION: Mild bronchitic changes. No focal pulmonary infiltrate. Electronically Signed   By: Donavan Foil M.D.   On: 10/07/2020 16:57    Procedures Procedures   Medications Ordered in ED Medications - No data to display  ED Course  I have reviewed the triage vital signs and the nursing notes.  Pertinent labs & imaging results that were available during my care of the patient were reviewed by me and considered in my medical decision making (see chart for details).  Clinical Course as of 10/07/20 1807  Fri Oct 07, 2020  1757 Chest x-ray shows bronchitic changes.  No pneumonia.  Laboratory tests are otherwise unremarkable. [JK]    Clinical Course User Index [JK] Dorie Rank, MD   MDM Rules/Calculators/A&P                          Patient presented to the ED for evaluation of URI type symptoms.  Patient had sore throat as well as cough and congestion.  In the ER her exam is reassuring.  She is afebrile.  She is not hypoxic.  She is breathing easily.  Her lungs are clear.  Patient symptoms are suggestive of an upper respiratory tract infection.  Her laboratory tests are unremarkable and the chest x-ray does not show pneumonia.  Patient has been fully vaccinated for COVID-19 but we will send off a Covid test.  She appears stable for discharge and outpatient management.  I will give her a prescription for Tessalon and guaifenesin.  Warning signs precautions discussed. Final Clinical Impression(s) / ED Diagnoses Final diagnoses:  Upper respiratory tract infection, unspecified type    Rx / DC Orders ED Discharge Orders         Ordered    benzonatate (TESSALON) 100 MG capsule  Every 8 hours        10/07/20 1805    Guaifenesin 1200 MG TB12  2 times daily        10/07/20 1805           Dorie Rank, MD 10/07/20 1807

## 2020-10-08 LAB — SARS CORONAVIRUS 2 (TAT 6-24 HRS): SARS Coronavirus 2: NEGATIVE

## 2020-10-11 ENCOUNTER — Ambulatory Visit: Payer: Medicare Other | Admitting: Internal Medicine

## 2020-10-13 ENCOUNTER — Other Ambulatory Visit: Payer: Self-pay

## 2020-10-13 ENCOUNTER — Ambulatory Visit (INDEPENDENT_AMBULATORY_CARE_PROVIDER_SITE_OTHER): Payer: Medicare Other | Admitting: Internal Medicine

## 2020-10-13 ENCOUNTER — Encounter: Payer: Self-pay | Admitting: Internal Medicine

## 2020-10-13 VITALS — BP 144/90 | HR 70 | Temp 97.7°F | Ht 63.0 in | Wt 215.0 lb

## 2020-10-13 DIAGNOSIS — E1165 Type 2 diabetes mellitus with hyperglycemia: Secondary | ICD-10-CM | POA: Diagnosis not present

## 2020-10-13 DIAGNOSIS — E039 Hypothyroidism, unspecified: Secondary | ICD-10-CM

## 2020-10-13 DIAGNOSIS — I10 Essential (primary) hypertension: Secondary | ICD-10-CM | POA: Diagnosis not present

## 2020-10-13 DIAGNOSIS — Z6838 Body mass index (BMI) 38.0-38.9, adult: Secondary | ICD-10-CM

## 2020-10-13 NOTE — Progress Notes (Signed)
I,[name],acting as a Education administrator for [name], MD.,have documented all relevant documentation on the behalf of [name], MD,as directed by  [name], MD while in the presence of [name], MD.  This visit occurred during the SARS-CoV-2 public health emergency.  Safety protocols were in place, including screening questions prior to the visit, additional usage of staff PPE, and extensive cleaning of exam room while observing appropriate contact time as indicated for disinfecting solutions.  Subjective:     Patient ID: [name] , female    DOB: Jun 16, 1941 , 80 y.o.   MRN: [id number]   Chief Complaint  Patient presents with  . Hypertension  . Diabetes    HPI  She presents today for BP and DM f/u. She reports compliance with meds. She denies headaches, chest pain and shortness of breath.  She admits she is not yet exercising as much as she should.   Hypertension This is a chronic problem. The current episode started more than 1 year ago. The problem has been gradually improving since onset. The problem is controlled. Pertinent negatives include no blurred vision, chest pain, palpitations or shortness of breath. Agents associated with hypertension include thyroid hormones. Risk factors for coronary artery disease include dyslipidemia, obesity, post-menopausal state and sedentary lifestyle. Past treatments include angiotensin blockers. The current treatment provides moderate improvement. Compliance problems include exercise.   Diabetes She presents for her follow-up diabetic visit. She has type 2 diabetes mellitus. Her disease course has been stable. There are no hypoglycemic associated symptoms. There are no diabetic associated symptoms. Pertinent negatives for diabetes include no blurred vision and no chest pain. There are no hypoglycemic complications. Risk factors for coronary artery disease include diabetes mellitus, dyslipidemia, hypertension, obesity,  post-menopausal and sedentary lifestyle. She participates in exercise intermittently. Eye exam is current.     Past Medical History:  Diagnosis Date  . Arthritis    RIGHT KNEE PAIN AND OA  . Chest pain 05/03/2015  . Diabetes mellitus without complication ([place])   . Elevated cholesterol   . GERD (gastroesophageal reflux disease)   . Hypertension   . Hypothyroidism   . IBS (irritable bowel syndrome)   . Rotator cuff disorder    BOTH SHOULDERS - NO SURGERY - AND STATES LIMITATIONS IN ARM MOVEMENT  . Shortness of breath 05/03/2015  . Swelling    CHRONIC SWELLING RIGHT HAND AND BOTH FEET AND BOTH LEGS  . Vertigo      Family History  Problem Relation Age of Onset  . Diabetes Mother   . Dementia Mother   . Diabetes Father   . Diabetes Sister   . Breast cancer Sister   . Kidney cancer Sister   . Lung cancer Sister   . Lung cancer Maternal Aunt   . Pancreatic cancer Maternal Aunt   . Cancer Other      Current Outpatient Medications:  .  Aspirin (ECOTRIN PO), Take 81 mg by mouth daily. , Disp: , Rfl:  .  BENICAR 20 MG tablet, TAKE 1 TABLET BY MOUTH  DAILY, Disp: 90 tablet, Rfl: 3 .  benzonatate (TESSALON) 100 MG capsule, Take 1 capsule (100 mg total) by mouth every 8 (eight) hours., Disp: 21 capsule, Rfl: 0 .  bimatoprost (LUMIGAN) 0.01 % SOLN, Place 1 drop into both eyes at bedtime. , Disp: , Rfl:  .  CRESTOR 10 MG tablet, TAKE 1 TABLET BY MOUTH  DAILY, Disp: 90 tablet, Rfl: 3 .  diazepam (VALIUM)  2 MG tablet, Take 1 tablet (2 mg total) by mouth every 12 (twelve) hours as needed for anxiety or muscle spasms., Disp: 20 tablet, Rfl: 0 .  famotidine (PEPCID) 20 MG tablet, Take by mouth., Disp: , Rfl:  .  furosemide (LASIX) 20 MG tablet, TAKE 1 TABLET BY MOUTH IN  THE MORNING, Disp: 90 tablet, Rfl: 3 .  Lifitegrast (XIIDRA) 5 % SOLN, Apply to eye. xiidra, Disp: , Rfl:  .  Multiple Vitamins-Minerals (CENTRUM SILVER PO), Take 1 tablet by mouth daily. , Disp: , Rfl:  .  Multiple  Vitamins-Minerals (EMERGEN-C VITAMIN C PO), Take 1 Package by mouth daily. Uses during the winter, Disp: , Rfl:  .  polyethylene glycol (MIRALAX / GLYCOLAX) packet, Take 17 g by mouth daily., Disp: , Rfl:  .  SYNTHROID 75 MCG tablet, TAKE 1 TABLET BY MOUTH  DAILY BEFORE BREAKFAST, Disp: 90 tablet, Rfl: 3 .  vitamin B-6 (PYRIDOXINE) 25 MG tablet, Take 25 mg by mouth daily., Disp: , Rfl:  .  MAGNESIUM-OXIDE 400 (241.3 Mg) MG tablet, TAKE 1 TABLET BY MOUTH EVERY DAY AFTER SUPPER, Disp: 90 tablet, Rfl: 2   Allergies  Allergen Reactions  . Codeine Nausea And Vomiting  . Macrobid [Nitrofurantoin Macrocrystal]     RASH  . Tramadol Nausea And Vomiting     Review of Systems  Constitutional: Negative.   Eyes: Negative for blurred vision.  Respiratory: Negative.  Negative for shortness of breath.   Cardiovascular: Negative.  Negative for chest pain and palpitations.  Gastrointestinal: Negative.   Psychiatric/Behavioral: Negative.   All other systems reviewed and are negative.    Today's Vitals   10/13/20 1100  BP: (!) 144/90  Pulse: 70  Temp: 97.7 F (36.5 C)  TempSrc: Oral  Weight: 215 lb (97.5 kg)  Height: $Remove'5\' 3"'wMYwyXo$  (1.6 m)   Body mass index is 38.09 kg/m.  Wt Readings from Last 3 Encounters:  10/13/20 215 lb (97.5 kg)  10/07/20 218 lb 8 oz (99.1 kg)  09/14/20 217 lb 6.4 oz (98.6 kg)   Objective:  Physical Exam Vitals and nursing note reviewed.  Constitutional:      Appearance: Normal appearance. She is obese.  HENT:     Head: Normocephalic and atraumatic.     Nose:     Comments: Masked     Mouth/Throat:     Comments: Masked  Eyes:     Extraocular Movements: Extraocular movements intact.  Cardiovascular:     Rate and Rhythm: Normal rate and regular rhythm.     Heart sounds: Normal heart sounds.  Pulmonary:     Effort: Pulmonary effort is normal.     Breath sounds: Normal breath sounds.  Musculoskeletal:     Cervical back: Normal range of motion.  Skin:    General:  Skin is warm.  Neurological:     General: No focal deficit present.     Mental Status: She is alert.  Psychiatric:        Mood and Affect: Mood normal.        Behavior: Behavior normal.         Assessment And Plan:     1. Essential hypertension Comments: Uncontrolled. Importance of salt restriction was d/w patient. Importance of regular exercise was also d/w patient.   2. Uncontrolled type 2 diabetes mellitus with hyperglycemia ([place]) Comments: Per preference, she is not taking any meds. I wlil request her most recent eye exam as well. I will check an a1c today as well.  -  Hemoglobin A1c - CMP14+EGFR  3. Primary hypothyroidism Comments: I will check TSh and adjust meds as needed. She is encouraged to take meds upon awakening and wait 30 minutes prior to eating/taking other meds.  - TSH  4. Class 2 severe obesity due to excess calories with serious comorbidity and body mass index (BMI) of 38.0 to 38.9 in adult [facility])  She is encouraged to strive for BMI less than 30 to decrease cardiac risk. Advised to aim for at least 150 minutes of exercise per week.   Patient was given opportunity to ask questions. Patient verbalized understanding of the plan and was able to repeat key elements of the plan. All questions were answered to their satisfaction.   I, [name], MD, have reviewed all documentation for this visit. The documentation on 10/13/20 for the exam, diagnosis, procedures, and orders are all accurate and complete.   IF YOU HAVE BEEN REFERRED TO A SPECIALIST, IT MAY TAKE 1-2 WEEKS TO SCHEDULE/PROCESS THE REFERRAL. IF YOU HAVE NOT HEARD FROM US/SPECIALIST IN TWO WEEKS, PLEASE GIVE [place] A CALL AT [phone].   THE PATIENT IS ENCOURAGED TO PRACTICE SOCIAL DISTANCING DUE TO THE COVID-19 PANDEMIC.

## 2020-10-13 NOTE — Patient Instructions (Signed)
Diabetes Mellitus and Foot Care Foot care is an important part of your health, especially when you have diabetes. Diabetes may cause you to have problems because of poor blood flow (circulation) to your feet and legs, which can cause your skin to:  Become thinner and drier.  Break more easily.  Heal more slowly.  Peel and crack. You may also have nerve damage (neuropathy) in your legs and feet, causing decreased feeling in them. This means that you may not notice minor injuries to your feet that could lead to more serious problems. Noticing and addressing any potential problems early is the best way to prevent future foot problems. How to care for your feet Foot hygiene  Wash your feet daily with warm water and mild soap. Do not use hot water. Then, pat your feet and the areas between your toes until they are completely dry. Do not soak your feet as this can dry your skin.  Trim your toenails straight across. Do not dig under them or around the cuticle. File the edges of your nails with an emery board or nail file.  Apply a moisturizing lotion or petroleum jelly to the skin on your feet and to dry, brittle toenails. Use lotion that does not contain alcohol and is unscented. Do not apply lotion between your toes.   Shoes and socks  Wear clean socks or stockings every day. Make sure they are not too tight. Do not wear knee-high stockings since they may decrease blood flow to your legs.  Wear shoes that fit properly and have enough cushioning. Always look in your shoes before you put them on to be sure there are no objects inside.  To break in new shoes, wear them for just a few hours a day. This prevents injuries on your feet. Wounds, scrapes, corns, and calluses  Check your feet daily for blisters, cuts, bruises, sores, and redness. If you cannot see the bottom of your feet, use a mirror or ask someone for help.  Do not cut corns or calluses or try to remove them with medicine.  If you  find a minor scrape, cut, or break in the skin on your feet, keep it and the skin around it clean and dry. You may clean these areas with mild soap and water. Do not clean the area with peroxide, alcohol, or iodine.  If you have a wound, scrape, corn, or callus on your foot, look at it several times a day to make sure it is healing and not infected. Check for: ? Redness, swelling, or pain. ? Fluid or blood. ? Warmth. ? Pus or a bad smell.   General tips  Do not cross your legs. This may decrease blood flow to your feet.  Do not use heating pads or hot water bottles on your feet. They may burn your skin. If you have lost feeling in your feet or legs, you may not know this is happening until it is too late.  Protect your feet from hot and cold by wearing shoes, such as at the beach or on hot pavement.  Schedule a complete foot exam at least once a year (annually) or more often if you have foot problems. Report any cuts, sores, or bruises to your health care provider immediately. Where to find more information  American Diabetes Association: www.diabetes.org  Association of Diabetes Care & Education Specialists: www.diabeteseducator.org Contact a health care provider if:  You have a medical condition that increases your risk of infection and   you have any cuts, sores, or bruises on your feet.  You have an injury that is not healing.  You have redness on your legs or feet.  You feel burning or tingling in your legs or feet.  You have pain or cramps in your legs and feet.  Your legs or feet are numb.  Your feet always feel cold.  You have pain around any toenails. Get help right away if:  You have a wound, scrape, corn, or callus on your foot and: ? You have pain, swelling, or redness that gets worse. ? You have fluid or blood coming from the wound, scrape, corn, or callus. ? Your wound, scrape, corn, or callus feels warm to the touch. ? You have pus or a bad smell coming from  the wound, scrape, corn, or callus. ? You have a fever. ? You have a red line going up your leg. Summary  Check your feet every day for blisters, cuts, bruises, sores, and redness.  Apply a moisturizing lotion or petroleum jelly to the skin on your feet and to dry, brittle toenails.  Wear shoes that fit properly and have enough cushioning.  If you have foot problems, report any cuts, sores, or bruises to your health care provider immediately.  Schedule a complete foot exam at least once a year (annually) or more often if you have foot problems. This information is not intended to replace advice given to you by your health care provider. Make sure you discuss any questions you have with your health care provider. Document Revised: 01/14/2020 Document Reviewed: 01/14/2020 Elsevier Patient Education  2021 [employer] Inc.  

## 2020-10-14 LAB — CMP14+EGFR
ALT: 38 IU/L — ABNORMAL HIGH (ref 0–32)
AST: 28 IU/L (ref 0–40)
Albumin/Globulin Ratio: 1.9 (ref 1.2–2.2)
Albumin: 3.9 g/dL (ref 3.6–4.6)
Alkaline Phosphatase: 122 IU/L — ABNORMAL HIGH (ref 44–121)
BUN/Creatinine Ratio: 22 (ref 12–28)
BUN: 14 mg/dL (ref 8–27)
Bilirubin Total: 0.6 mg/dL (ref 0.0–1.2)
CO2: 22 mmol/L (ref 20–29)
Calcium: 10.6 mg/dL — ABNORMAL HIGH (ref 8.7–10.3)
Chloride: 105 mmol/L (ref 96–106)
Creatinine, Ser: 0.63 mg/dL (ref 0.57–1.00)
Globulin, Total: 2.1 g/dL (ref 1.5–4.5)
Glucose: 115 mg/dL — ABNORMAL HIGH (ref 65–99)
Potassium: 4.3 mmol/L (ref 3.5–5.2)
Sodium: 140 mmol/L (ref 134–144)
Total Protein: 6 g/dL (ref 6.0–8.5)
eGFR: 89 mL/min/{1.73_m2} (ref >59)

## 2020-10-14 LAB — TSH: TSH: 1.03 u[IU]/mL (ref 0.450–4.500)

## 2020-10-14 LAB — HEMOGLOBIN A1C
Est. average glucose Bld gHb Est-mCnc: 146 mg/dL
Hgb A1c MFr Bld: 6.7 % — ABNORMAL HIGH (ref 4.8–5.6)

## 2020-10-18 ENCOUNTER — Encounter: Payer: Self-pay | Admitting: Internal Medicine

## 2020-10-18 ENCOUNTER — Other Ambulatory Visit: Payer: Self-pay | Admitting: Internal Medicine

## 2020-10-19 ENCOUNTER — Ambulatory Visit: Payer: Medicare Other | Admitting: Plastic Surgery

## 2020-12-19 ENCOUNTER — Telehealth: Payer: Self-pay | Admitting: Internal Medicine

## 2020-12-19 NOTE — Telephone Encounter (Signed)
Left message for patient to call back and schedule Medicare Annual Wellness Visit (AWV) either virtually or in office.   Last AWV 12/23/19  please schedule at anytime with [facility]    This should be a 45 minute visit.

## 2020-12-21 LAB — HM MAMMOGRAPHY

## 2020-12-22 ENCOUNTER — Encounter: Payer: Self-pay | Admitting: Internal Medicine

## 2021-01-02 ENCOUNTER — Encounter (HOSPITAL_BASED_OUTPATIENT_CLINIC_OR_DEPARTMENT_OTHER): Payer: Self-pay | Admitting: Urology

## 2021-01-02 ENCOUNTER — Emergency Department (HOSPITAL_BASED_OUTPATIENT_CLINIC_OR_DEPARTMENT_OTHER): Payer: Medicare Other

## 2021-01-02 ENCOUNTER — Other Ambulatory Visit: Payer: Self-pay

## 2021-01-02 ENCOUNTER — Emergency Department (HOSPITAL_BASED_OUTPATIENT_CLINIC_OR_DEPARTMENT_OTHER)
Admission: EM | Admit: 2021-01-02 | Discharge: 2021-01-02 | Disposition: A | Discharge status: Home / Self Care | Payer: Medicare Other | Attending: Emergency Medicine | Admitting: Emergency Medicine

## 2021-01-02 DIAGNOSIS — M546 Pain in thoracic spine: Secondary | ICD-10-CM | POA: Insufficient documentation

## 2021-01-02 DIAGNOSIS — Z96651 Presence of right artificial knee joint: Secondary | ICD-10-CM | POA: Diagnosis not present

## 2021-01-02 DIAGNOSIS — E039 Hypothyroidism, unspecified: Secondary | ICD-10-CM | POA: Insufficient documentation

## 2021-01-02 DIAGNOSIS — I10 Essential (primary) hypertension: Secondary | ICD-10-CM | POA: Insufficient documentation

## 2021-01-02 DIAGNOSIS — Z7982 Long term (current) use of aspirin: Secondary | ICD-10-CM | POA: Diagnosis not present

## 2021-01-02 DIAGNOSIS — Z79899 Other long term (current) drug therapy: Secondary | ICD-10-CM | POA: Diagnosis not present

## 2021-01-02 DIAGNOSIS — R109 Unspecified abdominal pain: Secondary | ICD-10-CM

## 2021-01-02 DIAGNOSIS — E119 Type 2 diabetes mellitus without complications: Secondary | ICD-10-CM | POA: Diagnosis not present

## 2021-01-02 LAB — CBC WITH DIFFERENTIAL/PLATELET
Abs Immature Granulocytes: 0.01 10*3/uL (ref 0.00–0.07)
Basophils Absolute: 0 10*3/uL (ref 0.0–0.1)
Basophils Relative: 1 %
Eosinophils Absolute: 0.1 10*3/uL (ref 0.0–0.5)
Eosinophils Relative: 2 %
HCT: 45.5 % (ref 36.0–46.0)
Hemoglobin: 15.6 g/dL — ABNORMAL HIGH (ref 12.0–15.0)
Immature Granulocytes: 0 %
Lymphocytes Relative: 50 %
Lymphs Abs: 2.2 10*3/uL (ref 0.7–4.0)
MCH: 31.3 pg (ref 26.0–34.0)
MCHC: 34.3 g/dL (ref 30.0–36.0)
MCV: 91.4 fL (ref 80.0–100.0)
Monocytes Absolute: 0.4 10*3/uL (ref 0.1–1.0)
Monocytes Relative: 9 %
Neutro Abs: 1.7 10*3/uL (ref 1.7–7.7)
Neutrophils Relative %: 38 %
Platelets: 178 10*3/uL (ref 150–400)
RBC: 4.98 MIL/uL (ref 3.87–5.11)
RDW: 13.6 % (ref 11.5–15.5)
WBC: 4.5 10*3/uL (ref 4.0–10.5)
nRBC: 0 % (ref 0.0–0.2)

## 2021-01-02 LAB — COMPREHENSIVE METABOLIC PANEL
ALT: 30 U/L (ref 0–44)
AST: 24 U/L (ref 15–41)
Albumin: 4 g/dL (ref 3.5–5.0)
Alkaline Phosphatase: 95 U/L (ref 38–126)
Anion gap: 8 (ref 5–15)
BUN: 14 mg/dL (ref 8–23)
CO2: 26 mmol/L (ref 22–32)
Calcium: 10.2 mg/dL (ref 8.9–10.3)
Chloride: 105 mmol/L (ref 98–111)
Creatinine, Ser: 0.67 mg/dL (ref 0.44–1.00)
GFR, Estimated: >60 mL/min (ref >60)
Glucose, Bld: 126 mg/dL — ABNORMAL HIGH (ref 70–99)
Potassium: 3.9 mmol/L (ref 3.5–5.1)
Sodium: 139 mmol/L (ref 135–145)
Total Bilirubin: 0.7 mg/dL (ref 0.3–1.2)
Total Protein: 6.7 g/dL (ref 6.5–8.1)

## 2021-01-02 LAB — URINALYSIS, ROUTINE W REFLEX MICROSCOPIC
Bilirubin Urine: NEGATIVE
Glucose, UA: NEGATIVE mg/dL
Ketones, ur: NEGATIVE mg/dL
Leukocytes,Ua: NEGATIVE
Nitrite: NEGATIVE
Protein, ur: NEGATIVE mg/dL
Specific Gravity, Urine: >1.03 — ABNORMAL HIGH (ref 1.005–1.030)
pH: 5 (ref 5.0–8.0)

## 2021-01-02 LAB — URINALYSIS, MICROSCOPIC (REFLEX): Bacteria, UA: NONE SEEN

## 2021-01-02 LAB — LIPASE, BLOOD: Lipase: 32 U/L (ref 11–51)

## 2021-01-02 MED ORDER — TIZANIDINE HCL 4 MG PO TABS: 4.0000 mg | ORAL_TABLET | Freq: Three times a day (TID) | ORAL | 0 refills | Status: DC | PRN

## 2021-01-02 MED ORDER — MORPHINE SULFATE (PF) 4 MG/ML IV SOLN
4.0000 mg | Freq: Once | INTRAVENOUS | Status: AC
  Administered 2021-01-02: 4 mg via INTRAVENOUS
  Filled 2021-01-02: qty 1

## 2021-01-02 MED ORDER — ONDANSETRON HCL 4 MG/2ML IJ SOLN
4.0000 mg | Freq: Once | INTRAMUSCULAR | Status: AC
  Administered 2021-01-02: 4 mg via INTRAVENOUS
  Filled 2021-01-02: qty 2

## 2021-01-02 MED ORDER — DICLOFENAC SODIUM 1 % EX GEL: 2.0000 g | Freq: Four times a day (QID) | CUTANEOUS | 0 refills | Status: DC

## 2021-01-02 NOTE — ED Provider Notes (Signed)
[place] EMERGENCY DEPARTMENT Provider Note   CSN: [id number] Arrival date & time: 01/02/21  1015     History Chief Complaint  Patient presents with   Flank Pain    [name] is a 80 y.o. female.  The history is provided by the patient. No language interpreter was used.  Flank Pain   80 year old female significant history of diabetes, IBS, hypertension, GERD, prior cholecystectomy, prior abdominal hysterectomy who presents with complaints of flank pain.  Patient report within the past week she has had intermittent left flank pain.  Pain is described as a sharp spasm sensation waxing waning worse with movement and is 10 out of 10.  She thought it would be related to muscle spasm and has been taking muscle relaxant without relief.  She denies any associated fever chills chest pain shortness of breath productive cough hemoptysis nausea vomiting diarrhea dysuria hematuria or rash.  No injury.  No history of kidney stone.  Pain is nonradiating.  Past Medical History:  Diagnosis Date   Arthritis    RIGHT KNEE PAIN AND OA   Chest pain 05/03/2015   Diabetes mellitus without complication (HCC)    Elevated cholesterol    GERD (gastroesophageal reflux disease)    Hypertension    Hypothyroidism    IBS (irritable bowel syndrome)    Rotator cuff disorder    BOTH SHOULDERS - NO SURGERY - AND STATES LIMITATIONS IN ARM MOVEMENT   Shortness of breath 05/03/2015   Swelling    CHRONIC SWELLING RIGHT HAND AND BOTH FEET AND BOTH LEGS   Vertigo     Patient Active Problem List   Diagnosis Date Noted   Class 2 severe obesity due to excess calories with serious comorbidity and body mass index (BMI) of 39.0 to 39.9 in adult [facility]) 07/13/2020   Hypothyroidism 04/06/2018   Hyperlipidemia 04/06/2018   Essential hypertension 04/06/2018   Abnormal glucose 04/06/2018   Chest pain 05/03/2015   Shortness of breath 05/03/2015   Dizziness and giddiness 05/18/2014   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   Vertigo 01/06/2014   Postoperative anemia due to acute blood loss 12/30/2012   OA (osteoarthritis) of knee 12/29/2012    Past Surgical History:  Procedure Laterality Date   [place]   RIGHT BREAST BIOPSY - BENIGN   CARPAL TUNNEL RELEASE Left 02/05/2019   Procedure: LEFT CARPAL TUNNEL RELEASE;  Surgeon: Daryll Brod, MD;  Location: [place];  Service: Orthopedics;  Laterality: Left;   [place]   BILATERAL CATARACT EXTRACTIONS; SURGERY FOR MACULAR HOLE    HEMORRHOID SURGERY  1975   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   Procedure: RIGHT TOTAL KNEE ARTHROPLASTY;  Surgeon: Gearlean Alf, MD;  Location: [facility];  Service: Orthopedics;  Laterality: Right;     OB History   No obstetric history on file.     Family History  Problem Relation Age of Onset   Diabetes Mother    Dementia Mother    Diabetes Father    Diabetes Sister    Breast cancer Sister    Kidney cancer Sister    Lung cancer Sister    Lung cancer Maternal Aunt    Pancreatic cancer Maternal Aunt    Cancer Other     Social History   Tobacco Use   Smoking status: Never   Smokeless tobacco: Never  Vaping Use   Vaping Use:  Never used  Substance Use Topics   Alcohol use: Not Currently    Comment: OCCAS ALCOHOL   Drug use: No    Home Medications Prior to Admission medications   Medication Sig Start Date End Date Taking? Authorizing Provider  Aspirin (ECOTRIN PO) Take 81 mg by mouth daily.     [provider]  BENICAR 20 MG tablet TAKE 1 TABLET BY MOUTH  DAILY 03/15/20   Glendale Chard, MD  benzonatate (TESSALON) 100 MG capsule Take 1 capsule (100 mg total) by mouth every 8 (eight) hours. 10/07/20   Dorie Rank, MD  bimatoprost (LUMIGAN) 0.01 % SOLN Place 1 drop into both eyes at bedtime.     [provider]  CRESTOR 10 MG tablet TAKE 1 TABLET BY MOUTH  DAILY 03/15/20   Glendale Chard, MD   famotidine (PEPCID) 20 MG tablet Take by mouth.    [provider]  furosemide (LASIX) 20 MG tablet TAKE 1 TABLET BY MOUTH IN  THE MORNING 12/21/19   Glendale Chard, MD  Lifitegrast Shirley Friar) 5 % SOLN Apply to eye. xiidra    [provider]  MAGNESIUM-OXIDE 400 (241.3 Mg) MG tablet TAKE 1 TABLET BY MOUTH EVERY DAY AFTER SUPPER 10/18/20   Glendale Chard, MD  Multiple Vitamins-Minerals (CENTRUM SILVER PO) Take 1 tablet by mouth daily.     [provider]  Multiple Vitamins-Minerals (EMERGEN-C VITAMIN C PO) Take 1 Package by mouth daily. Uses during the winter    [provider]  polyethylene glycol (MIRALAX / GLYCOLAX) packet Take 17 g by mouth daily.    [provider]  SYNTHROID 75 MCG tablet TAKE 1 TABLET BY MOUTH  DAILY BEFORE BREAKFAST 10/04/20   Glendale Chard, MD  vitamin B-6 (PYRIDOXINE) 25 MG tablet Take 25 mg by mouth daily.    [provider]    Allergies    Codeine, Macrobid [nitrofurantoin macrocrystal], and Tramadol  Review of Systems   Review of Systems  Genitourinary:  Positive for flank pain.  All other systems reviewed and are negative.  Physical Exam Updated Vital Signs BP (!) 144/62 (BP Location: Right Arm)   Pulse (!) 59   Temp 98.1 F (36.7 C) (Oral)   Resp 18   Ht 5\' 3"  (1.6 m)   Wt 97.5 kg   SpO2 98%   BMI 38.09 kg/m   Physical Exam Vitals and nursing note reviewed.  Constitutional:      General: She is not in acute distress.    Appearance: She is well-developed. She is obese.  HENT:     Head: Atraumatic.  Eyes:     Conjunctiva/sclera: Conjunctivae normal.  Cardiovascular:     Rate and Rhythm: Normal rate and regular rhythm.  Pulmonary:     Effort: Pulmonary effort is normal.  Abdominal:     Palpations: Abdomen is soft.     Tenderness: There is no abdominal tenderness. There is left CVA tenderness. There is no right CVA tenderness.  Musculoskeletal:        General: Tenderness (tenderness to  palpation of the left thoracic paraspinal muscle and left flank.  No overlying skin changes.) present.     Cervical back: Neck supple.  Skin:    Findings: No rash.  Neurological:     Mental Status: She is alert and oriented to person, place, and time.  Psychiatric:        Mood and Affect: Mood normal.    ED Results / Procedures / Treatments   Labs (  all labs ordered are listed, but only abnormal results are displayed) Labs Reviewed  CBC WITH DIFFERENTIAL/PLATELET - Abnormal; Notable for the following components:      Result Value   Hemoglobin 15.6 (*)    All other components within normal limits  COMPREHENSIVE METABOLIC PANEL - Abnormal; Notable for the following components:   Glucose, Bld 126 (*)    All other components within normal limits  URINALYSIS, ROUTINE W REFLEX MICROSCOPIC - Abnormal; Notable for the following components:   Specific Gravity, Urine >1.030 (*)    Hgb urine dipstick SMALL (*)    All other components within normal limits  LIPASE, BLOOD  URINALYSIS, MICROSCOPIC (REFLEX)    EKG None  Radiology CT Renal Stone Study  Result Date: 01/02/2021 CLINICAL DATA:  Left flank pain EXAM: CT ABDOMEN AND PELVIS WITHOUT CONTRAST TECHNIQUE: Multidetector CT imaging of the abdomen and pelvis was performed following the standard protocol without IV contrast. COMPARISON:  2018 FINDINGS: Lower chest: Mild left basilar scarring. Partially imaged 5 mm nodule of the right middle lobe that may be increased in size. Hepatobiliary: Too small to characterize low-attenuation lesion of the superior left hepatic lobe. Cholecystectomy. No unexpected biliary dilatation. Pancreas: Unremarkable. Spleen: Unremarkable. 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 Left adrenal is unremarkable. Bilateral renal cysts and too small to characterize low-attenuation lesions. No renal calculi. No hydronephrosis. Ureters are normal in caliber. Bladder is poorly distended.  Stomach/Bowel: Stomach is within normal limits. Bowel is normal in caliber. Distal colonic diverticulosis. Vascular/Lymphatic: Aortic atherosclerosis. No enlarged lymph nodes. Reproductive: Status post hysterectomy. No adnexal masses. Other: Umbilical and periumbilical fat containing hernias. A short segment of small bowel extends into another ventral hernia. No ascites. Musculoskeletal: Degenerative changes of the included spine. No acute osseous abnormality. IMPRESSION: No urinary tract calculi or hydronephrosis. Partially imaged 5 mm right middle lobe nodule may have increased in size since 2018. Recommend follow-up noncontrast chest CT in 12 months. Additional chronic/nonemergent findings including: Colonic diverticulosis, fat containing ventral abdominal wall hernias, ventral hernia with short segment small bowel, and atherosclerosis. Electronically Signed   By: Macy Mis M.D.   On: 01/02/2021 13:15    Procedures Procedures   Medications Ordered in ED Medications - No data to display  ED Course  I have reviewed the triage vital signs and the nursing notes.  Pertinent labs & imaging results that were available during my care of the patient were reviewed by me and considered in my medical decision making (see chart for details).    MDM Rules/Calculators/A&P                          BP (!) 125/50   Pulse (!) 53   Temp 98.1 F (36.7 C) (Oral)   Resp 20   Ht 5\' 3"  (1.6 m)   Wt 97.5 kg   SpO2 98%   BMI 38.09 kg/m   Final Clinical Impression(s) / ED Diagnoses Final diagnoses:  Left flank pain    Rx / DC Orders ED Discharge Orders          Ordered    tiZANidine (ZANAFLEX) 4 MG tablet  Every 8 hours PRN        01/02/21 1549    diclofenac Sodium (VOLTAREN) 1 % GEL  4 times daily        01/02/21 1549           12:24 PM Patient here with intermittent left flank  pain.  Pain is reproducible worsen with movement likely musculoskeletal in origin.  Given her age, will obtain  CT of the abdomen pelvis and labs.  Symptomatic treatment provided.  3:47 PM Urinalysis obtained showing no signs of urinary tract infection.  Labs overall well-appearing.  CT scan of the abdomen and pelvis without any evidence of kidney stones or hydronephrosis.  There is a partially viewed 5 mm right middle lobe which may have increased since 2018.  I discussed this with patient and recommend a follow-up noncontrast chest CT in 12 months.  Also incidental finding of colonic diverticulosis and ventral hernia that was noted on CT scan not likely contribute to her symptoms.  Will provide symptomatic treatment as as this is likely muscle skeletal.  Care discussed with Dr. Regenia Skeeter.  Patient to follow-up with PCP.  Return precaution given.   Domenic Moras, PA-C 01/02/21 [place], MD 01/03/21 [phone]

## 2021-01-02 NOTE — ED Triage Notes (Signed)
Left side flank pain x 1 week, denies urinary symptoms, states worsening today

## 2021-01-02 NOTE — Discharge Instructions (Addendum)
Your pain may be due to muscle strain/muscle spasm.  Apply Voltaren gel to affected area several times daily for comfort.  You may take muscle relaxant as needed for muscle spasm.  Your CT scan incidentally shows that you have a nodule on your right middle lung.  Please discuss this with your primary care doctor and request for a follow-up noncontrast chest CT within the next 12 months for monitoring.  It is my pleasure to care for you today

## 2021-01-03 ENCOUNTER — Telehealth: Payer: Self-pay

## 2021-01-03 NOTE — Telephone Encounter (Signed)
Transition Care Management Follow-up Telephone Call Date of discharge and from where: 01/02/2021 [facility] How have you been since you were released from the hospital? Still having some pain, taking the pain med and feels ok. Any questions or concerns? No  Items Reviewed: Did the pt receive and understand the discharge instructions provided? Yes  Medications obtained and verified? Yes  Other? No  Any new allergies since your discharge? No  Dietary orders reviewed? N/a Do you have support at home? Yes   Home Care and Equipment/Supplies: Were home health services ordered? no If so, what is the name of the agency?   Has the agency set up a time to come to the patient's home? not applicable Were any new equipment or medical supplies ordered?  No What is the name of the medical supply agency?  Were you able to get the supplies/equipment? not applicable Do you have any questions related to the use of the equipment or supplies? No  Functional Questionnaire: (I = Independent and D = Dependent) ADLs: I  Bathing/Dressing- I  Meal Prep- I  Eating- I  Maintaining continence- I  Transferring/Ambulation- I  Managing Meds- I  Follow up appointments reviewed:  PCP Hospital f/u appt confirmed? Yes  Scheduled to see Dr. Baird Cancer on 01/11/2021 @ 4:15. [facility] f/u appt confirmed? No   Are transportation arrangements needed? No  If their condition worsens, is the pt aware to call PCP or go to the Emergency Dept.? Yes Was the patient provided with contact information for the PCP's office or ED? Yes Was to pt encouraged to call back with questions or concerns? Yes

## 2021-01-03 NOTE — Telephone Encounter (Signed)
Transition Care Management Unsuccessful Follow-up Telephone Call  Date of discharge and from where:  01/02/2021 [facility]  Attempts:  1st Attempt  Reason for unsuccessful TCM follow-up call:  Left voice message

## 2021-01-06 ENCOUNTER — Other Ambulatory Visit: Payer: Self-pay | Admitting: Internal Medicine

## 2021-01-08 ENCOUNTER — Other Ambulatory Visit: Payer: Self-pay

## 2021-01-08 ENCOUNTER — Encounter (HOSPITAL_BASED_OUTPATIENT_CLINIC_OR_DEPARTMENT_OTHER): Payer: Self-pay | Admitting: Emergency Medicine

## 2021-01-08 ENCOUNTER — Emergency Department (HOSPITAL_BASED_OUTPATIENT_CLINIC_OR_DEPARTMENT_OTHER)
Admission: EM | Admit: 2021-01-08 | Discharge: 2021-01-08 | Disposition: A | Discharge status: Home / Self Care | Payer: Medicare Other | Attending: Emergency Medicine | Admitting: Emergency Medicine

## 2021-01-08 DIAGNOSIS — M26601 Right temporomandibular joint disorder, unspecified: Secondary | ICD-10-CM | POA: Diagnosis not present

## 2021-01-08 DIAGNOSIS — E119 Type 2 diabetes mellitus without complications: Secondary | ICD-10-CM | POA: Diagnosis not present

## 2021-01-08 DIAGNOSIS — Z7982 Long term (current) use of aspirin: Secondary | ICD-10-CM | POA: Diagnosis not present

## 2021-01-08 DIAGNOSIS — R197 Diarrhea, unspecified: Secondary | ICD-10-CM | POA: Insufficient documentation

## 2021-01-08 DIAGNOSIS — Z96651 Presence of right artificial knee joint: Secondary | ICD-10-CM | POA: Insufficient documentation

## 2021-01-08 DIAGNOSIS — I10 Essential (primary) hypertension: Secondary | ICD-10-CM | POA: Insufficient documentation

## 2021-01-08 DIAGNOSIS — M26609 Unspecified temporomandibular joint disorder, unspecified side: Secondary | ICD-10-CM

## 2021-01-08 DIAGNOSIS — R519 Headache, unspecified: Secondary | ICD-10-CM | POA: Diagnosis present

## 2021-01-08 DIAGNOSIS — E039 Hypothyroidism, unspecified: Secondary | ICD-10-CM | POA: Diagnosis not present

## 2021-01-08 MED ORDER — CYCLOBENZAPRINE HCL 10 MG PO TABS: 10.0000 mg | ORAL_TABLET | Freq: Three times a day (TID) | ORAL | 0 refills | Status: DC | PRN

## 2021-01-08 NOTE — ED Provider Notes (Signed)
[place] DEPT [facility] Provider Note: Georgena Spurling, MD, FACEP  CSN: [id number] MRN: [id number] ARRIVAL: 01/08/21 at [place]: [place]  Ear Pain   HISTORY OF PRESENT ILLNESS  01/08/21 6:17 AM [name] is a 80 y.o. female with pain on the right side of her face.  She has had this before but her dentist was unable to find the cause.  She has had it since yesterday.  It is centered around the TMJ and is worse when she moves her jaw.  She rates it as a 10 out of 10 at its worst.  She is currently taking tizanidine for left flank muscle spasms but states that that is not helping either problem.  She has had some loose stools recently.   Past Medical History:  Diagnosis Date   Arthritis    RIGHT KNEE PAIN AND OA   Chest pain 05/03/2015   Diabetes mellitus without complication (HCC)    Elevated cholesterol    GERD (gastroesophageal reflux disease)    Hypertension    Hypothyroidism    IBS (irritable bowel syndrome)    Rotator cuff disorder    BOTH SHOULDERS - NO SURGERY - AND STATES LIMITATIONS IN ARM MOVEMENT   Shortness of breath 05/03/2015   Swelling    CHRONIC SWELLING RIGHT HAND AND BOTH FEET AND BOTH LEGS   Vertigo     Past Surgical History:  Procedure Laterality Date   ABDOMINAL HYSTERECTOMY  1985   BREAST SURGERY  1985   RIGHT BREAST BIOPSY - BENIGN   CARPAL TUNNEL RELEASE Left 02/05/2019   Procedure: LEFT CARPAL TUNNEL RELEASE;  Surgeon: Daryll Brod, MD;  Location: [place];  Service: Orthopedics;  Laterality: Left;   [place]   BILATERAL CATARACT EXTRACTIONS; SURGERY FOR MACULAR HOLE    HEMORRHOID SURGERY  1975   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   Procedure: RIGHT TOTAL KNEE ARTHROPLASTY;  Surgeon: Gearlean Alf, MD;  Location: [facility];  Service: Orthopedics;  Laterality: Right;    Family History  Problem Relation Age of Onset   Diabetes Mother    Dementia Mother    Diabetes  Father    Diabetes Sister    Breast cancer Sister    Kidney cancer Sister    Lung cancer Sister    Lung cancer Maternal Aunt    Pancreatic cancer Maternal Aunt    Cancer Other     Social History   Tobacco Use   Smoking status: Never   Smokeless tobacco: Never  Vaping Use   Vaping Use: Never used  Substance Use Topics   Alcohol use: Not Currently    Comment: OCCAS ALCOHOL   Drug use: No    Prior to Admission medications   Medication Sig Start Date End Date Taking? Authorizing Provider  cyclobenzaprine (FLEXERIL) 10 MG tablet Take 1 tablet (10 mg total) by mouth 3 (three) times daily as needed for muscle spasms. 01/08/21  Yes Osias Resnick, MD  Aspirin (ECOTRIN PO) Take 81 mg by mouth daily.     [provider]  BENICAR 20 MG tablet TAKE 1 TABLET BY MOUTH  DAILY 03/15/20   Glendale Chard, MD  bimatoprost (LUMIGAN) 0.01 % SOLN Place 1 drop into both eyes at bedtime.     [provider]  CRESTOR 10 MG tablet TAKE 1 TABLET BY MOUTH  DAILY 03/15/20   Glendale Chard, MD  famotidine (PEPCID) 20 MG  tablet Take by mouth.    [provider]  furosemide (LASIX) 20 MG tablet TAKE 1 TABLET BY MOUTH IN  THE MORNING 01/06/21   Glendale Chard, MD  Lifitegrast Shirley Friar) 5 % SOLN Apply to eye. xiidra    [provider]  MAGNESIUM-OXIDE 400 (241.3 Mg) MG tablet TAKE 1 TABLET BY MOUTH EVERY DAY AFTER SUPPER 10/18/20   Glendale Chard, MD  polyethylene glycol [facility] Floria Raveling) packet Take 17 g by mouth daily.    [provider]  SYNTHROID 75 MCG tablet TAKE 1 TABLET BY MOUTH  DAILY BEFORE BREAKFAST Patient taking differently: Monday - Friday 10/04/20   Glendale Chard, MD  vitamin B-6 (PYRIDOXINE) 25 MG tablet Take 25 mg by mouth daily.    [provider]    Allergies Codeine, Macrobid [nitrofurantoin macrocrystal], and Tramadol   REVIEW OF SYSTEMS  Negative except as noted here or in the History of Present Illness.   PHYSICAL EXAMINATION  Initial  Vital Signs Blood pressure (!) 171/75, pulse 72, temperature 97.6 F (36.4 C), temperature source Oral, resp. rate 18, height 5\' 3"  (1.6 m), weight 95.3 kg, SpO2 99 %.  Examination General: Well-developed, well-nourished female in no acute distress; appearance consistent with age of record HENT: normocephalic; atraumatic; tender right TMJ, no click felt on movement of jaw Eyes: pupils equal, round and reactive to light; extraocular muscles intact; arcus senilis bilaterally Neck: supple Heart: regular rate and rhythm Lungs: clear to auscultation bilaterally Abdomen: soft; nondistended; nontender; bowel sounds present Extremities: No deformity; full range of motion; pulses normal Neurologic: Awake, alert and oriented; motor function intact in all extremities and symmetric; no facial droop Skin: Warm and dry Psychiatric: Normal mood and affect   RESULTS  Summary of this visit's results, reviewed and interpreted by myself:   EKG Interpretation  Date/Time:    Ventricular Rate:    PR Interval:    QRS Duration:   QT Interval:    QTC Calculation:   R Axis:     Text Interpretation:          Laboratory Studies: No results found for this or any previous visit (from the past 24 hour(s)). Imaging Studies: No results found.  ED COURSE and MDM  Nursing notes, initial and subsequent vitals signs, including pulse oximetry, reviewed and interpreted by myself.  Vitals:   01/08/21 0610 01/08/21 0611  BP: (!) 171/75   Pulse: 72   Resp: 18   Temp: 97.6 F (36.4 C)   TempSrc: Oral   SpO2: 99%   Weight:  95.3 kg  Height:  5\' 3"  (1.6 m)   Medications - No data to display  We will try the patient on Flexeril instead of her current muscle relaxant.  She was advised not to take them both.  I wish to avoid benzodiazepines given her age.  She was advised to start Flexeril at bedtime only until she can tell if it makes her drowsy.  She was advised to follow-up with her  dentist  PROCEDURES  Procedures   ED DIAGNOSES     ICD-10-CM   1. 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

## 2021-01-08 NOTE — ED Triage Notes (Signed)
Pt c/o right side ear and face pain that radiates down behind ear and up to forehead. States that it is a sharp pain. Denies any drainage, hearing changes. Pt aaox3, ambulatory with steady gait, VSS, GCS 15, NAD noted during triage.

## 2021-01-11 ENCOUNTER — Other Ambulatory Visit: Payer: Self-pay

## 2021-01-11 ENCOUNTER — Encounter: Payer: Self-pay | Admitting: Internal Medicine

## 2021-01-11 ENCOUNTER — Ambulatory Visit (INDEPENDENT_AMBULATORY_CARE_PROVIDER_SITE_OTHER): Payer: Medicare Other | Admitting: Internal Medicine

## 2021-01-11 VITALS — BP 120/68 | HR 90 | Temp 98.1°F | Ht 63.0 in | Wt 216.6 lb

## 2021-01-11 DIAGNOSIS — M545 Low back pain, unspecified: Secondary | ICD-10-CM | POA: Diagnosis not present

## 2021-01-11 DIAGNOSIS — M26609 Unspecified temporomandibular joint disorder, unspecified side: Secondary | ICD-10-CM | POA: Diagnosis not present

## 2021-01-11 DIAGNOSIS — M4316 Spondylolisthesis, lumbar region: Secondary | ICD-10-CM

## 2021-01-11 DIAGNOSIS — G8929 Other chronic pain: Secondary | ICD-10-CM | POA: Diagnosis not present

## 2021-01-11 NOTE — Progress Notes (Signed)
I,[name],acting as a Education administrator for [name], MD.,have documented all relevant documentation on the behalf of [name], MD,as directed by  [name], MD while in the presence of [name], MD.  This visit occurred during the SARS-CoV-2 public health emergency.  Safety protocols were in place, including screening questions prior to the visit, additional usage of staff PPE, and extensive cleaning of exam room while observing appropriate contact time as indicated for disinfecting solutions.  Subjective:     Patient ID: [name] , female    DOB: December 16, 1940 , 80 y.o.   MRN: [id number]   Chief Complaint  Patient presents with   hospital f/u    HPI  The patient is here today for a ER f/u.  She presented on 7/3 to ER for further evaluation of ear pain. KENNIYA WESTRICH is a 80 y.o. female with pain on the right side of her face.  She has had this before but her dentist was unable to find the cause.  She has had it since the day prior to presentation, 01/07/21.  It is centered around the TMJ and is worse when she moves her jaw.  She rated it as a 10 out of 10 at its worst.  She had been taking taking tizanidine for left flank muscle spasms but stated this was ineffective. She was diagnosed with TMJ and prescribed flexeril to take prn. She does admit this has helped somewhat with her sx. She reports having upcoming appt w/ her dentist.     Past Medical History:  Diagnosis Date   Arthritis    RIGHT KNEE PAIN AND OA   Chest pain 05/03/2015   Diabetes mellitus without complication (HCC)    Elevated cholesterol    GERD (gastroesophageal reflux disease)    Hypertension    Hypothyroidism    IBS (irritable bowel syndrome)    Rotator cuff disorder    BOTH SHOULDERS - NO SURGERY - AND STATES LIMITATIONS IN ARM MOVEMENT   Shortness of breath 05/03/2015   Swelling    CHRONIC SWELLING RIGHT HAND AND BOTH FEET AND BOTH LEGS   Vertigo      Family History  Problem Relation  Age of Onset   Diabetes Mother    Dementia Mother    Diabetes Father    Diabetes Sister    Breast cancer Sister    Kidney cancer Sister    Lung cancer Sister    Lung cancer Maternal Aunt    Pancreatic cancer Maternal Aunt    Cancer Other      Current Outpatient Medications:    Aspirin (ECOTRIN PO), Take 81 mg by mouth daily. , Disp: , Rfl:    BENICAR 20 MG tablet, TAKE 1 TABLET BY MOUTH  DAILY, Disp: 90 tablet, Rfl: 3   bimatoprost (LUMIGAN) 0.01 % SOLN, Place 1 drop into both eyes at bedtime. , Disp: , Rfl:    CRESTOR 10 MG tablet, TAKE 1 TABLET BY MOUTH  DAILY, Disp: 90 tablet, Rfl: 3   cyclobenzaprine (FLEXERIL) 10 MG tablet, Take 1 tablet (10 mg total) by mouth 3 (three) times daily as needed for muscle spasms., Disp: 20 tablet, Rfl: 0   famotidine (PEPCID) 20 MG tablet, Take by mouth., Disp: , Rfl:    furosemide (LASIX) 20 MG tablet, TAKE 1 TABLET BY MOUTH IN  THE MORNING, Disp: 90 tablet, Rfl: 3   Lifitegrast (XIIDRA) 5 % SOLN, Apply to eye. xiidra, Disp: , Rfl:  MAGNESIUM-OXIDE 400 (241.3 Mg) MG tablet, TAKE 1 TABLET BY MOUTH EVERY DAY AFTER SUPPER, Disp: 90 tablet, Rfl: 2   polyethylene glycol (MIRALAX / GLYCOLAX) packet, Take 17 g by mouth daily., Disp: , Rfl:    SYNTHROID 75 MCG tablet, TAKE 1 TABLET BY MOUTH  DAILY BEFORE BREAKFAST (Patient taking differently: Monday - Friday), Disp: 90 tablet, Rfl: 3   vitamin B-6 (PYRIDOXINE) 25 MG tablet, Take 25 mg by mouth daily., Disp: , Rfl:    Allergies  Allergen Reactions   Codeine Nausea And Vomiting   Macrobid [Nitrofurantoin Macrocrystal]     RASH   Tramadol Nausea And Vomiting     Review of Systems  Constitutional: Negative.   Respiratory: Negative.    Cardiovascular: Negative.   Gastrointestinal: Negative.   Musculoskeletal:  Positive for back pain.  Psychiatric/Behavioral: Negative.    All other systems reviewed and are negative.   Today's Vitals   01/11/21 1555  BP: 120/68  Pulse: 90  Temp: 98.1 F (36.7  C)  TempSrc: Oral  Weight: 216 lb 9.6 oz (98.2 kg)  Height: 5\' 3"  (1.6 m)  PainSc: 7   PainLoc: Face   Body mass index is 38.37 kg/m.  Wt Readings from Last 3 Encounters:  01/11/21 216 lb 9.6 oz (98.2 kg)  01/08/21 210 lb (95.3 kg)  01/02/21 215 lb (97.5 kg)    BP Readings from Last 3 Encounters:  01/11/21 120/68  01/08/21 135/71  01/02/21 (!) 117/53    Objective:  Physical Exam Vitals and nursing note reviewed.  Constitutional:      Appearance: Normal appearance. She is obese.  HENT:     Head: Normocephalic and atraumatic.     Comments: TMJ tenderness to palpation    Right Ear: Tympanic membrane, ear canal and external ear normal. There is no impacted cerumen.     Left Ear: Tympanic membrane, ear canal and external ear normal. There is no impacted cerumen.     Nose:     Comments: Masked     Mouth/Throat:     Comments: Masked  Cardiovascular:     Rate and Rhythm: Normal rate and regular rhythm.     Heart sounds: Normal heart sounds.  Pulmonary:     Effort: Pulmonary effort is normal.     Breath sounds: Normal breath sounds.  Musculoskeletal:     Cervical back: Normal range of motion.  Skin:    General: Skin is warm.  Neurological:     General: No focal deficit present.     Mental Status: She is alert.  Psychiatric:        Mood and Affect: Mood normal.        Behavior: Behavior normal.        Assessment And Plan:     1. TMJ (temporomandibular joint syndrome) Comments: She is advised to f/u w/ her dentist, advised she may also have bruxism and may need night guard. She will c/w flexeril prn. Advised to avoid chewing gum.  2. Chronic left-sided low back pain without sciatica Comments: I will refer her for MRI lumbar spine for further evaluation. She is encouraged to perform stretching exercises as taught in PT.  - MR Lumbar Spine Wo Contrast; Future  3. Anterolisthesis of lumbar spine Comments: Chronic. Seen on lumbar x-ray performed Jan 2022.  Again, I  stressed importance of regular stretching. - MR Lumbar Spine Wo Contrast; Future   I personally spent 25 minutes face-to-face and non-face-to-face in the care of this patient,  which includes all pre-, intra-, and post visit time on the date of service.  Patient was given opportunity to ask questions. Patient verbalized understanding of the plan and was able to repeat key elements of the plan. All questions were answered to their satisfaction.   I, [name], MD, have reviewed all documentation for this visit. The documentation on 01/11/21 for the exam, diagnosis, procedures, and orders are all accurate and complete.   IF YOU HAVE BEEN REFERRED TO A SPECIALIST, IT MAY TAKE 1-2 WEEKS TO SCHEDULE/PROCESS THE REFERRAL. IF YOU HAVE NOT HEARD FROM US/SPECIALIST IN TWO WEEKS, PLEASE GIVE [place] A CALL AT [phone] X [phone].   THE PATIENT IS ENCOURAGED TO PRACTICE SOCIAL DISTANCING DUE TO THE COVID-19 PANDEMIC.

## 2021-01-14 ENCOUNTER — Ambulatory Visit (HOSPITAL_BASED_OUTPATIENT_CLINIC_OR_DEPARTMENT_OTHER)
Admission: RE | Admit: 2021-01-14 | Discharge: 2021-01-14 | Disposition: A | Discharge status: Home / Self Care | Payer: Medicare Other | Source: Ambulatory Visit | Attending: Internal Medicine | Admitting: Internal Medicine

## 2021-01-14 ENCOUNTER — Other Ambulatory Visit: Payer: Self-pay

## 2021-01-14 DIAGNOSIS — M4316 Spondylolisthesis, lumbar region: Secondary | ICD-10-CM

## 2021-01-14 DIAGNOSIS — G8929 Other chronic pain: Secondary | ICD-10-CM

## 2021-01-14 DIAGNOSIS — M545 Low back pain, unspecified: Secondary | ICD-10-CM | POA: Diagnosis present

## 2021-01-18 ENCOUNTER — Telehealth: Payer: Self-pay | Admitting: Internal Medicine

## 2021-01-18 NOTE — Telephone Encounter (Signed)
Left message for patient to call back and schedule Medicare Annual Wellness Visit (AWV) either virtually or in office.   Last AWV 12/23/19  please schedule at anytime with [facility]    This should be a 45 minute visit.

## 2021-01-21 ENCOUNTER — Other Ambulatory Visit (HOSPITAL_BASED_OUTPATIENT_CLINIC_OR_DEPARTMENT_OTHER): Payer: Medicare Other

## 2021-01-30 ENCOUNTER — Other Ambulatory Visit: Payer: Self-pay | Admitting: Internal Medicine

## 2021-01-30 DIAGNOSIS — M545 Low back pain, unspecified: Secondary | ICD-10-CM

## 2021-01-30 DIAGNOSIS — G8929 Other chronic pain: Secondary | ICD-10-CM

## 2021-01-30 DIAGNOSIS — M4316 Spondylolisthesis, lumbar region: Secondary | ICD-10-CM

## 2021-01-30 DIAGNOSIS — M48062 Spinal stenosis, lumbar region with neurogenic claudication: Secondary | ICD-10-CM

## 2021-02-23 ENCOUNTER — Other Ambulatory Visit: Payer: Self-pay

## 2021-02-23 ENCOUNTER — Ambulatory Visit (INDEPENDENT_AMBULATORY_CARE_PROVIDER_SITE_OTHER): Payer: Medicare Other | Admitting: Internal Medicine

## 2021-02-23 ENCOUNTER — Encounter: Payer: Self-pay | Admitting: Internal Medicine

## 2021-02-23 VITALS — BP 118/60 | HR 74 | Temp 97.6°F | Ht 62.0 in | Wt 212.8 lb

## 2021-02-23 DIAGNOSIS — K120 Recurrent oral aphthae: Secondary | ICD-10-CM | POA: Diagnosis not present

## 2021-02-23 DIAGNOSIS — E039 Hypothyroidism, unspecified: Secondary | ICD-10-CM

## 2021-02-23 DIAGNOSIS — I10 Essential (primary) hypertension: Secondary | ICD-10-CM | POA: Diagnosis not present

## 2021-02-23 DIAGNOSIS — M48062 Spinal stenosis, lumbar region with neurogenic claudication: Secondary | ICD-10-CM

## 2021-02-23 DIAGNOSIS — Z79899 Other long term (current) drug therapy: Secondary | ICD-10-CM

## 2021-02-23 DIAGNOSIS — Z6838 Body mass index (BMI) 38.0-38.9, adult: Secondary | ICD-10-CM

## 2021-02-23 DIAGNOSIS — E1165 Type 2 diabetes mellitus with hyperglycemia: Secondary | ICD-10-CM

## 2021-02-23 MED ORDER — BLOOD GLUCOSE MONITOR SYSTEM W/DEVICE KIT: PACK | 3 refills | Status: DC

## 2021-02-23 MED ORDER — CYCLOBENZAPRINE HCL 5 MG PO TABS: 5.0000 mg | ORAL_TABLET | Freq: Three times a day (TID) | ORAL | 0 refills | Status: DC | PRN

## 2021-02-23 NOTE — Progress Notes (Addendum)
I,[name],acting as a Education administrator for Stephanie Greenland, MD.,have documented all relevant documentation on the behalf of Stephanie Greenland, MD,as directed by  Stephanie Greenland, MD while in the presence of Stephanie Greenland, MD.  This visit occurred during the SARS-CoV-2 public health emergency.  Safety protocols were in place, including screening questions prior to the visit, additional usage of staff PPE, and extensive cleaning of exam room while observing appropriate contact time as indicated for disinfecting solutions.  Subjective:     Patient ID: [name] , female    DOB: 1940-12-06 , 80 y.o.   MRN: [id number]   Chief Complaint  Patient presents with   Diabetes   Hypertension    HPI  She presents today for BP and DM f/u. She reports compliance with meds. She denies headaches, chest pain and shortness of breath.  She admits she is not yet exercising as much as she should.    Diabetes She presents for her follow-up diabetic visit. She has type 2 diabetes mellitus. Her disease course has been stable. There are no hypoglycemic associated symptoms. There are no diabetic associated symptoms. Pertinent negatives for diabetes include no blurred vision and no chest pain. There are no hypoglycemic complications. Risk factors for coronary artery disease include diabetes mellitus, dyslipidemia, hypertension, obesity, post-menopausal and sedentary lifestyle. She participates in exercise intermittently. Eye exam is current.  Hypertension This is a chronic problem. The current episode started more than 1 year ago. The problem has been gradually improving since onset. The problem is controlled. Pertinent negatives include no blurred vision, chest pain, palpitations or shortness of breath. Agents associated with hypertension include thyroid hormones. Risk factors for coronary artery disease include dyslipidemia, obesity, post-menopausal state and sedentary lifestyle. Past treatments include angiotensin  blockers. The current treatment provides moderate improvement. Compliance problems include exercise.     Past Medical History:  Diagnosis Date   Arthritis    RIGHT KNEE PAIN AND OA   Chest pain 05/03/2015   Diabetes mellitus without complication (HCC)    Elevated cholesterol    GERD (gastroesophageal reflux disease)    Hypertension    Hypothyroidism    IBS (irritable bowel syndrome)    Rotator cuff disorder    BOTH SHOULDERS - NO SURGERY - AND STATES LIMITATIONS IN ARM MOVEMENT   Shortness of breath 05/03/2015   Swelling    CHRONIC SWELLING RIGHT HAND AND BOTH FEET AND BOTH LEGS   Vertigo      Family History  Problem Relation Age of Onset   Diabetes Mother    Dementia Mother    Diabetes Father    Diabetes Sister    Breast cancer Sister    Kidney cancer Sister    Lung cancer Sister    Lung cancer Maternal Aunt    Pancreatic cancer Maternal Aunt    Cancer Other      Current Outpatient Medications:    cyclobenzaprine (FLEXERIL) 5 MG tablet, Take 1 tablet (5 mg total) by mouth 3 (three) times daily as needed for muscle spasms., Disp: 30 tablet, Rfl: 0   Aspirin (ECOTRIN PO), Take 81 mg by mouth daily. , Disp: , Rfl:    BENICAR 20 MG tablet, TAKE 1 TABLET BY MOUTH  DAILY, Disp: 90 tablet, Rfl: 3   bimatoprost (LUMIGAN) 0.01 % SOLN, Place 1 drop into both eyes at bedtime. , Disp: , Rfl:    Blood Glucose Monitoring Suppl (BLOOD GLUCOSE MONITOR SYSTEM) w/Device KIT, Use to check blood sugar  3 times a day. Dx code e11.65, Disp: 3 kit, Rfl: 3   CRESTOR 10 MG tablet, TAKE 1 TABLET BY MOUTH  DAILY, Disp: 90 tablet, Rfl: 3   famotidine (PEPCID) 20 MG tablet, Take by mouth., Disp: , Rfl:    furosemide (LASIX) 20 MG tablet, TAKE 1 TABLET BY MOUTH IN  THE MORNING, Disp: 90 tablet, Rfl: 3   glucose blood (ONETOUCH VERIO) test strip, Use as instructed to check blood sugars 3 times daily E11.69, Disp: 100 each, Rfl: 12   Lifitegrast (XIIDRA) 5 % SOLN, Apply to eye. xiidra, Disp: , Rfl:     MAGNESIUM-OXIDE 400 (241.3 Mg) MG tablet, TAKE 1 TABLET BY MOUTH EVERY DAY AFTER SUPPER, Disp: 90 tablet, Rfl: 2   OneTouch Delica Lancets 76O MISC, 1 Stick by Does not apply route 3 (three) times daily before meals., Disp: 100 each, Rfl: 2   polyethylene glycol (MIRALAX / GLYCOLAX) packet, Take 17 g by mouth daily., Disp: , Rfl:    SYNTHROID 75 MCG tablet, TAKE 1 TABLET BY MOUTH  DAILY BEFORE BREAKFAST (Patient taking differently: Monday - Friday), Disp: 90 tablet, Rfl: 3   vitamin B-6 (PYRIDOXINE) 25 MG tablet, Take 25 mg by mouth daily., Disp: , Rfl:    Allergies  Allergen Reactions   Codeine Nausea And Vomiting   Macrobid [Nitrofurantoin Macrocrystal]     RASH   Tramadol Nausea And Vomiting     Review of Systems  Constitutional: Negative.   HENT:  Positive for mouth sores.   Eyes:  Negative for blurred vision.  Respiratory: Negative.  Negative for shortness of breath.   Cardiovascular: Negative.  Negative for chest pain and palpitations.  Gastrointestinal: Negative.   Musculoskeletal:  Positive for back pain.       Has intermittent flares of chronic back pain. Would like refill of cyclobenzaprine since she has an upcoming trip to [facility].   Neurological: Negative.     Today's Vitals   02/23/21 1008  BP: 118/60  Pulse: 74  Temp: 97.6 F (36.4 C)  TempSrc: Oral  Weight: 212 lb 12.8 oz (96.5 kg)  Height: $Remove'5\' 2"'tqbykXH$  (1.575 m)   Body mass index is 38.92 kg/m.  Wt Readings from Last 3 Encounters:  02/23/21 212 lb 12.8 oz (96.5 kg)  01/11/21 216 lb 9.6 oz (98.2 kg)  01/08/21 210 lb (95.3 kg)    Objective:  Physical Exam Vitals and nursing note reviewed.  Constitutional:      Appearance: Normal appearance. She is obese.  HENT:     Head: Normocephalic and atraumatic.     Nose:     Comments: Masked     Mouth/Throat:     Comments: No visible ulcers noted Eyes:     Extraocular Movements: Extraocular movements intact.  Cardiovascular:     Rate and Rhythm: Normal rate  and regular rhythm.     Heart sounds: Normal heart sounds.  Pulmonary:     Effort: Pulmonary effort is normal.     Breath sounds: Normal breath sounds.  Musculoskeletal:     Cervical back: Normal range of motion.  Skin:    General: Skin is warm.  Neurological:     General: No focal deficit present.     Mental Status: She is alert.  Psychiatric:        Mood and Affect: Mood normal.        Behavior: Behavior normal.        Assessment And Plan:     1.  Uncontrolled type 2 diabetes mellitus with hyperglycemia ([place]) Comments: I will check labs as listed below. Importance of regular activity was discussed with the patient.  She will f/u in 4 months.  - CMP14+EGFR - Hemoglobin A1c - AMB Referral to [place]  2. Essential hypertension Comments: Chronic, well controlled. She is encouraged to follow low sodium diet. No med changes today. I will check her renal function. - CMP14+EGFR - AMB Referral to [place]  3. Primary hypothyroidism Comments: TSH 1.030 in April 2022, I will recheck thyroid labs at next visit. She will c/w Synthroid 55mg M-F.  - AMB Referral to [place] 4. Canker sores oral Comments: I will call in [place] She will let me know if her sx persist.  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   6. Class 2 severe obesity due to excess calories with serious comorbidity and body mass index (BMI) of 38.0 to 38.9 in adult ([facility] Comments:  She is encouraged to strive for BMI less than 30 to decrease cardiac risk. Advised to aim for at least 150 minutes of exercise per week.   7. Drug therapy The [employer] pharmacogenomics process and benefits of testing was discussed with the patient. After the discussion, the patient made an informed consent to testing and the sample was  collected and mailed to the external lab.   Patient was given opportunity to ask questions. Patient verbalized understanding of the plan and was able to repeat key elements of the plan. All questions were answered to their satisfaction.   I, RMaximino Greenland MD, have reviewed all documentation for this visit. The documentation on 02/23/21 for the exam, diagnosis, procedures, and orders are all accurate and complete.   IF YOU HAVE BEEN REFERRED TO A SPECIALIST, IT MAY TAKE 1-2 WEEKS TO SCHEDULE/PROCESS THE REFERRAL. IF YOU HAVE NOT HEARD FROM US/SPECIALIST IN TWO WEEKS, PLEASE GIVE [place] CALL AT [phone].   THE PATIENT IS ENCOURAGED TO PRACTICE SOCIAL DISTANCING DUE TO THE COVID-19 PANDEMIC.

## 2021-02-23 NOTE — Patient Instructions (Signed)

## 2021-02-27 ENCOUNTER — Other Ambulatory Visit: Payer: Self-pay

## 2021-02-27 MED ORDER — ONETOUCH DELICA LANCETS 33G MISC: 1.0000 | Freq: Three times a day (TID) | 2 refills | Status: DC

## 2021-02-27 MED ORDER — ONETOUCH VERIO VI STRP
ORAL_STRIP | 12 refills | Status: DC
Start: 2021-02-27 — End: 2022-03-11

## 2021-02-28 ENCOUNTER — Telehealth: Payer: Self-pay | Admitting: Internal Medicine

## 2021-02-28 ENCOUNTER — Telehealth: Payer: Self-pay

## 2021-02-28 NOTE — Telephone Encounter (Signed)
Left message for patient to call back and schedule Medicare Annual Wellness Visit (AWV) either virtually or in office.    Left both my jabber number [phone] and office number    Last AWV 12/23/19  please schedule at anytime with [facility]    This should be a 45 minute visit.

## 2021-02-28 NOTE — Telephone Encounter (Signed)
I called patient regarding her prescription for Cyclobenzaprine HCL '5mg'$  she is to just pay cash price for the medication and she was advised to call the office if she had any concerns or questions. YL,RMA

## 2021-03-05 LAB — CMP14+EGFR
ALT: 33 IU/L — ABNORMAL HIGH (ref 0–32)
AST: 22 IU/L (ref 0–40)
Albumin/Globulin Ratio: 1.9 (ref 1.2–2.2)
Albumin: 4.3 g/dL (ref 3.6–4.6)
Alkaline Phosphatase: 117 IU/L (ref 44–121)
BUN/Creatinine Ratio: 26 (ref 12–28)
BUN: 19 mg/dL (ref 8–27)
Bilirubin Total: 0.6 mg/dL (ref 0.0–1.2)
CO2: 23 mmol/L (ref 20–29)
Calcium: 10.7 mg/dL — ABNORMAL HIGH (ref 8.7–10.3)
Chloride: 108 mmol/L — ABNORMAL HIGH (ref 96–106)
Creatinine, Ser: 0.73 mg/dL (ref 0.57–1.00)
Globulin, Total: 2.3 g/dL (ref 1.5–4.5)
Glucose: 116 mg/dL — ABNORMAL HIGH (ref 65–99)
Potassium: 4.4 mmol/L (ref 3.5–5.2)
Sodium: 142 mmol/L (ref 134–144)
Total Protein: 6.6 g/dL (ref 6.0–8.5)
eGFR: 83 mL/min/{1.73_m2} (ref >59)

## 2021-03-05 LAB — HEMOGLOBIN A1C
Est. average glucose Bld gHb Est-mCnc: 146 mg/dL
Hgb A1c MFr Bld: 6.7 % — ABNORMAL HIGH (ref 4.8–5.6)

## 2021-03-05 LAB — VITAMIN B12: Vitamin B-12: 546 pg/mL (ref 232–1245)

## 2021-03-05 LAB — ANTINUCLEAR ANTIBODIES, IFA: ANA Titer 1: NEGATIVE

## 2021-03-06 ENCOUNTER — Telehealth: Payer: Self-pay | Admitting: *Deleted

## 2021-03-06 NOTE — Chronic Care Management (AMB) (Signed)
  Chronic Care Management   Note  03/06/2021 Name: [name] MRN: [id number] DOB: April 30, 1941  [name] is a 80 y.o. year old female who is a primary care patient of Glendale Chard, MD. I reached out to Meredith Pel by phone today in response to a referral sent by Ms. Judee Clara Czerniak's PCP, Dr. Baird Cancer.      [name] was given information about Chronic Care Management services today including:  CCM service includes personalized support from designated clinical staff supervised by her physician, including individualized plan of care and coordination with other care providers 24/7 contact phone numbers for assistance for urgent and routine care needs. Service will only be billed when office clinical staff spend 20 minutes or more in a month to coordinate care. Only one practitioner may furnish and bill the service in a calendar month. The patient may stop CCM services at any time (effective at the end of the month) by phone call to the office staff. The patient will be responsible for cost sharing (co-pay) of up to 20% of the service fee (after annual deductible is met).  Patient agreed to services and verbal consent obtained.   Follow up plan: Telephone appointment with care management team member scheduled for:04/12/2021  [facility]  Direct Dial: [phone]

## 2021-03-10 ENCOUNTER — Telehealth: Payer: Self-pay

## 2021-03-10 ENCOUNTER — Other Ambulatory Visit: Payer: Self-pay | Admitting: Internal Medicine

## 2021-03-10 NOTE — Telephone Encounter (Signed)
Left the pt a detailed message.

## 2021-03-10 NOTE — Telephone Encounter (Signed)
-----   Message from Glendale Chard, MD sent at 03/07/2021 10:50 PM EDT ----- Have her give [place] a call about Ozempic next week. No need to increase to six bottles of water ----- Message ----- From: Michelle Nasuti, CMA Sent: 03/06/2021   8:13 AM EDT To: Glendale Chard, MD  Patient called.  Patient aware.the pt said she drinks 4 bottles of 16 ounces of water, she said she has been hard time getting in the water, the pt said she didn't like that she has to run to the bathroom but she will increase to 6 bottles of water daily. she said will have to think about doing the once a week injection and no to a family history of thyroid cancer.

## 2021-03-14 ENCOUNTER — Telehealth: Payer: Self-pay

## 2021-03-14 NOTE — Telephone Encounter (Signed)
The pt called left a message to let Dr. Baird Cancer know that she will not be doing the requested shot.

## 2021-03-16 ENCOUNTER — Other Ambulatory Visit: Payer: Self-pay | Admitting: Internal Medicine

## 2021-03-16 DIAGNOSIS — Z79899 Other long term (current) drug therapy: Secondary | ICD-10-CM

## 2021-03-29 ENCOUNTER — Ambulatory Visit (INDEPENDENT_AMBULATORY_CARE_PROVIDER_SITE_OTHER): Payer: Medicare Other

## 2021-03-29 ENCOUNTER — Other Ambulatory Visit: Payer: Self-pay

## 2021-03-29 VITALS — BP 128/62 | HR 84 | Temp 97.8°F | Ht 61.8 in | Wt 212.8 lb

## 2021-03-29 DIAGNOSIS — Z0000 Encounter for general adult medical examination without abnormal findings: Secondary | ICD-10-CM

## 2021-03-29 NOTE — Patient Instructions (Signed)
[name] , Thank you for taking time to come for your Medicare Wellness Visit. I appreciate your ongoing commitment to your health goals. Please review the following plan we discussed and let me know if I can assist you in the future.   Screening recommendations/referrals: Colonoscopy: not required Mammogram: completed 12/21/2020 Bone Density: completed 12/20/2000 Recommended yearly ophthalmology/optometry visit for glaucoma screening and checkup Recommended yearly dental visit for hygiene and checkup  Vaccinations: Influenza vaccine: due Pneumococcal vaccine: completed 04/28/2018 Tdap vaccine: completed 08/15/2016, due 08/15/2026 Shingles vaccine: completed   Covid-19: 10/20/2020, 05/14/2020, 08/17/2019, 07/20/2019  Advanced directives: copy in chart  Conditions/risks identified: none  Next appointment: Follow up in one year for your annual wellness visit    Preventive Care 34 Years and Older, Female Preventive care refers to lifestyle choices and visits with your health care provider that can promote health and wellness. What does preventive care include? A yearly physical exam. This is also called an annual well check. Dental exams once or twice a year. Routine eye exams. Ask your health care provider how often you should have your eyes checked. Personal lifestyle choices, including: Daily care of your teeth and gums. Regular physical activity. Eating a healthy diet. Avoiding tobacco and drug use. Limiting alcohol use. Practicing safe sex. Taking low-dose aspirin every day. Taking vitamin and mineral supplements as recommended by your health care provider. What happens during an annual well check? The services and screenings done by your health care provider during your annual well check will depend on your age, overall health, lifestyle risk factors, and family history of disease. Counseling  Your health care provider may ask you questions about your: Alcohol use. Tobacco  use. Drug use. Emotional well-being. Home and relationship well-being. Sexual activity. Eating habits. History of falls. Memory and ability to understand (cognition). Work and work Statistician. Reproductive health. Screening  You may have the following tests or measurements: Height, weight, and BMI. Blood pressure. Lipid and cholesterol levels. These may be checked every 5 years, or more frequently if you are over 38 years old. Skin check. Lung cancer screening. You may have this screening every year starting at age 18 if you have a 30-pack-year history of smoking and currently smoke or have quit within the past 15 years. Fecal occult blood test (FOBT) of the stool. You may have this test every year starting at age 77. Flexible sigmoidoscopy or colonoscopy. You may have a sigmoidoscopy every 5 years or a colonoscopy every 10 years starting at age 87. Hepatitis C blood test. Hepatitis B blood test. Sexually transmitted disease (STD) testing. Diabetes screening. This is done by checking your blood sugar (glucose) after you have not eaten for a while (fasting). You may have this done every 1-3 years. Bone density scan. This is done to screen for osteoporosis. You may have this done starting at age 63. Mammogram. This may be done every 1-2 years. Talk to your health care provider about how often you should have regular mammograms. Talk with your health care provider about your test results, treatment options, and if necessary, the need for more tests. Vaccines  Your health care provider may recommend certain vaccines, such as: Influenza vaccine. This is recommended every year. Tetanus, diphtheria, and acellular pertussis (Tdap, Td) vaccine. You may need a Td booster every 10 years. Zoster vaccine. You may need this after age 72. Pneumococcal 13-valent conjugate (PCV13) vaccine. One dose is recommended after age 62. Pneumococcal polysaccharide (PPSV23) vaccine. One dose is recommended  after  age 12. Talk to your health care provider about which screenings and vaccines you need and how often you need them. This information is not intended to replace advice given to you by your health care provider. Make sure you discuss any questions you have with your health care provider. Document Released: 07/22/2015 Document Revised: 03/14/2016 Document Reviewed: 04/26/2015 Elsevier Interactive Patient Education  2017 [place] Prevention in the Home Falls can cause injuries. They can happen to people of all ages. There are many things you can do to make your home safe and to help prevent falls. What can I do on the outside of my home? Regularly fix the edges of walkways and driveways and fix any cracks. Remove anything that might make you trip as you walk through a door, such as a raised step or threshold. Trim any bushes or trees on the path to your home. Use bright outdoor lighting. Clear any walking paths of anything that might make someone trip, such as rocks or tools. Regularly check to see if handrails are loose or broken. Make sure that both sides of any steps have handrails. Any raised decks and porches should have guardrails on the edges. Have any leaves, snow, or ice cleared regularly. Use sand or salt on walking paths during winter. Clean up any spills in your garage right away. This includes oil or grease spills. What can I do in the bathroom? Use night lights. Install grab bars by the toilet and in the tub and shower. Do not use towel bars as grab bars. Use non-skid mats or decals in the tub or shower. If you need to sit down in the shower, use a plastic, non-slip stool. Keep the floor dry. Clean up any water that spills on the floor as soon as it happens. Remove soap buildup in the tub or shower regularly. Attach bath mats securely with double-sided non-slip rug tape. Do not have throw rugs and other things on the floor that can make you trip. What can I do  in the bedroom? Use night lights. Make sure that you have a light by your bed that is easy to reach. Do not use any sheets or blankets that are too big for your bed. They should not hang down onto the floor. Have a firm chair that has side arms. You can use this for support while you get dressed. Do not have throw rugs and other things on the floor that can make you trip. What can I do in the kitchen? Clean up any spills right away. Avoid walking on wet floors. Keep items that you use a lot in easy-to-reach places. If you need to reach something above you, use a strong step stool that has a grab bar. Keep electrical cords out of the way. Do not use floor polish or wax that makes floors slippery. If you must use wax, use non-skid floor wax. Do not have throw rugs and other things on the floor that can make you trip. What can I do with my stairs? Do not leave any items on the stairs. Make sure that there are handrails on both sides of the stairs and use them. Fix handrails that are broken or loose. Make sure that handrails are as long as the stairways. Check any carpeting to make sure that it is firmly attached to the stairs. Fix any carpet that is loose or worn. Avoid having throw rugs at the top or bottom of the stairs. If you do have  throw rugs, attach them to the floor with carpet tape. Make sure that you have a light switch at the top of the stairs and the bottom of the stairs. If you do not have them, ask someone to add them for you. What else can I do to help prevent falls? Wear shoes that: Do not have high heels. Have rubber bottoms. Are comfortable and fit you well. Are closed at the toe. Do not wear sandals. If you use a stepladder: Make sure that it is fully opened. Do not climb a closed stepladder. Make sure that both sides of the stepladder are locked into place. Ask someone to hold it for you, if possible. Clearly mark and make sure that you can see: Any grab bars or  handrails. First and last steps. Where the edge of each step is. Use tools that help you move around (mobility aids) if they are needed. These include: Canes. Walkers. Scooters. Crutches. Turn on the lights when you go into a dark area. Replace any light bulbs as soon as they burn out. Set up your furniture so you have a clear path. Avoid moving your furniture around. If any of your floors are uneven, fix them. If there are any pets around you, be aware of where they are. Review your medicines with your doctor. Some medicines can make you feel dizzy. This can increase your chance of falling. Ask your doctor what other things that you can do to help prevent falls. This information is not intended to replace advice given to you by your health care provider. Make sure you discuss any questions you have with your health care provider. Document Released: 04/21/2009 Document Revised: 12/01/2015 Document Reviewed: 07/30/2014 Elsevier Interactive Patient Education  2017 [employer].

## 2021-03-29 NOTE — Progress Notes (Signed)
This visit occurred during the SARS-CoV-2 public health emergency.  Safety protocols were in place, including screening questions prior to the visit, additional usage of staff PPE, and extensive cleaning of exam room while observing appropriate contact time as indicated for disinfecting solutions.  Subjective:   [name] is a 80 y.o. female who presents for Medicare Annual (Subsequent) preventive examination.  Review of Systems     Cardiac Risk Factors include: advanced age (>32mn, >>48women);diabetes mellitus;dyslipidemia;hypertension;sedentary lifestyle     Objective:    Today's Vitals   03/29/21 1448  BP: 128/62  Pulse: 84  Temp: 97.8 F (36.6 C)  TempSrc: Oral  SpO2: 96%  Weight: 212 lb 12.8 oz (96.5 kg)  Height: 5' 1.8" (1.57 m)   Body mass index is 39.17 kg/m.  Advanced Directives 03/29/2021 01/02/2021 10/07/2020 07/17/2020 04/28/2020 12/23/2019 02/05/2019  Does Patient Have a Medical Advance Directive? Yes No _0   Type of AParamedicof AGoodingLiving will - Living will;Healthcare Power of ACampbellLiving will - H[place]Living will -  Does patient want to make changes to medical advance directive? - - - - No - Patient declined - No - Patient declined  Copy of [place] Chart? Yes - validated most recent copy scanned in chart (See row information) - - No - copy requested - Yes - validated most recent copy scanned in chart (See row information) No - copy requested  Pre-existing out of facility DNR order (yellow form or pink MOST form) - - - - - - -    Current Medications (verified) Outpatient Encounter Medications as of 03/29/2021  Medication Sig   Apoaequorin (PREVAGEN PO) Take 1 tablet by mouth daily.   Aspirin (ECOTRIN PO) Take 81 mg by mouth daily.    BENICAR 20 MG tablet TAKE 1 TABLET BY MOUTH  DAILY   bimatoprost (LUMIGAN) 0.01 % SOLN Place 1 drop into both  eyes at bedtime.    Blood Glucose Monitoring Suppl (BLOOD GLUCOSE MONITOR SYSTEM) w/Device KIT Use to check blood sugar 3 times a day. Dx code e11.65   CRESTOR 10 MG tablet TAKE 1 TABLET BY MOUTH  DAILY   cyclobenzaprine (FLEXERIL) 5 MG tablet Take 1 tablet (5 mg total) by mouth 3 (three) times daily as needed for muscle spasms.   famotidine (PEPCID) 20 MG tablet Take by mouth.   furosemide (LASIX) 20 MG tablet TAKE 1 TABLET BY MOUTH IN  THE MORNING   glucose blood (ONETOUCH VERIO) test strip Use as instructed to check blood sugars 3 times daily E11.69   Lifitegrast (XIIDRA) 5 % SOLN Apply to eye. xiidra   MAGNESIUM-OXIDE 400 (241.3 Mg) MG tablet TAKE 1 TABLET BY MOUTH EVERY DAY AFTER SUPPER   OneTouch Delica Lancets 316BMISC 1 Stick by Does not apply route 3 (three) times daily before meals.   polyethylene glycol (MIRALAX / GLYCOLAX) packet Take 17 g by mouth daily.   SYNTHROID 75 MCG tablet TAKE 1 TABLET BY MOUTH  DAILY BEFORE BREAKFAST (Patient taking differently: Monday - Friday)   vitamin B-6 (PYRIDOXINE) 25 MG tablet Take 25 mg by mouth daily.   No facility-administered encounter medications on file as of 03/29/2021.    Allergies (verified) Codeine, Macrobid [nitrofurantoin macrocrystal], and Tramadol   History: Past Medical History:  Diagnosis Date   Arthritis    RIGHT KNEE PAIN AND OA   Chest pain 05/03/2015   Diabetes mellitus without  complication (HCC)    Elevated cholesterol    GERD (gastroesophageal reflux disease)    Hypertension    Hypothyroidism    IBS (irritable bowel syndrome)    Rotator cuff disorder    BOTH SHOULDERS - NO SURGERY - AND STATES LIMITATIONS IN ARM MOVEMENT   Shortness of breath 05/03/2015   Swelling    CHRONIC SWELLING RIGHT HAND AND BOTH FEET AND BOTH LEGS   Vertigo    Past Surgical History:  Procedure Laterality Date   ABDOMINAL HYSTERECTOMY  1985   BREAST SURGERY  1985   RIGHT BREAST BIOPSY - BENIGN   CARPAL TUNNEL RELEASE Left 02/05/2019    Procedure: LEFT CARPAL TUNNEL RELEASE;  Surgeon: Kuzma, Gary, MD;  Location: [facility];  Service: Orthopedics;  Laterality: Left;   CHOLECYSTECTOMY  1975   EYE SURGERY  1995   BILATERAL CATARACT EXTRACTIONS; SURGERY FOR MACULAR HOLE    HEMORRHOID SURGERY  1975   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   Procedure: RIGHT TOTAL KNEE ARTHROPLASTY;  Surgeon: Frank V Aluisio, MD;  Location: [facility];  Service: Orthopedics;  Laterality: Right;   Family History  Problem Relation Age of Onset   Diabetes Mother    Dementia Mother    Diabetes Father    Diabetes Sister    Breast cancer Sister    Kidney cancer Sister    Lung cancer Sister    Lung cancer Maternal Aunt    Pancreatic cancer Maternal Aunt    Cancer Other    Social History   Socioeconomic History   Marital status: Widowed    Spouse name: nathaniel   Number of children: 4   Years of education: 12   Highest education level: Not on file  Occupational History   Occupation: Retired  Tobacco Use   Smoking status: Never   Smokeless tobacco: Never  Vaping Use   Vaping Use: Never used  Substance and Sexual Activity   Alcohol use: Not Currently    Comment: OCCAS ALCOHOL   Drug use: No   Sexual activity: Not Currently  Other Topics Concern   Not on file  Social History Narrative   [name]   Patient is retired.    Patient has 4 children.    Patient has a high school education.    Patient is widowed   Right handed      Epworth Sleepiness Scale = 1 (as of 05/03/2015)   Social Determinants of Health   Financial Resource Strain: Low Risk    Difficulty of Paying Living Expenses: Not hard at all  Food Insecurity: No Food Insecurity   Worried About Running Out of Food in the Last Year: Never true   Ran Out of Food in the Last Year: Never true  Transportation Needs: No Transportation Needs   Lack of Transportation (Medical): No   Lack of Transportation (Non-Medical): No  Physical  Activity: Sufficiently Active   Days of Exercise per Week: 4 days   Minutes of Exercise per Session: 40 min  Stress: No Stress Concern Present   Feeling of Stress : Not at all  Social Connections: Not on file    Tobacco Counseling Counseling given: Not Answered   Clinical Intake:  Pre-visit preparation completed: Yes  Pain : No/denies pain     Nutritional Status: BMI > 30  Obese Nutritional Risks: None Diabetes: Yes  How often do you need to have someone help you when you read instructions, pamphlets, or other written   materials from your doctor or pharmacy?: 1 - Never What is the last grade level you completed in school?: 12th grade  Diabetic? Yes Nutrition Risk Assessment:  Has the patient had any N/V/D within the last 2 months?  No  Does the patient have any non-healing wounds?  No  Has the patient had any unintentional weight loss or weight gain?  No   Diabetes:  Is the patient diabetic?  Yes  If diabetic, was a CBG obtained today?  No  Did the patient bring in their glucometer from home?  No  How often do you monitor your CBG's? 3 times weekly.   Financial Strains and Diabetes Management:  Are you having any financial strains with the device, your supplies or your medication? No .  Does the patient want to be seen by Chronic Care Management for management of their diabetes?  No  Would the patient like to be referred to a Nutritionist or for Diabetic Management?  No   Diabetic Exams:  Diabetic Eye Exam: Completed 09/01/2020 Diabetic Foot Exam: Overdue, Pt has been advised about the importance in completing this exam. Pt is scheduled for diabetic foot exam on next appointment.   Interpreter Needed?: No  Information entered by :: NAllen LPN   Activities of Daily Living In your present state of health, do you have any difficulty performing the following activities: 03/29/2021 01/11/2021  Hearing? N N  Vision? N N  Difficulty concentrating or making decisions?  N N  Walking or climbing stairs? Y Y  Dressing or bathing? N N  Doing errands, shopping? N N  Preparing Food and eating ? N -  Using the Toilet? N -  In the past six months, have you accidently leaked urine? N -  Do you have problems with loss of bowel control? N -  Managing your Medications? N -  Managing your Finances? N -  Housekeeping or managing your Housekeeping? N -  Some recent data might be hidden    Patient Care Team: Sanders, Robyn, MD as PCP - General (Internal Medicine) Pearson, Vallie J, RPH (Pharmacist)  Indicate any recent Medical Services you may have received from other than [facility] providers in the past year (date may be approximate).     Assessment:   This is a routine wellness examination for [name].  Hearing/Vision screen Vision Screening - Comments:: Regular eye exams, Dr. Whitaker  Dietary issues and exercise activities discussed: Current Exercise Habits: Structured exercise class, Type of exercise: Other - see comments (water aerobics), Time (Minutes): 45, Frequency (Times/Week): 4, Weekly Exercise (Minutes/Week): 180   Goals Addressed             This Visit's Progress    Patient Stated       03/29/2021, wants to weigh 180 pounds       Depression Screen PHQ 2/9 Scores 03/29/2021 04/28/2020 12/23/2019 07/07/2019 01/27/2019 11/04/2018 05/01/2018  PHQ - 2 Score 0 0 0 2 0 0 0  PHQ- 9 Score - - 3 6 0 - -    Fall Risk Fall Risk  03/29/2021 02/23/2021 04/28/2020 12/23/2019 06/23/2019  Falls in the past year? 1 1 0 0 0  Comment not sure what happened - - - -  Number falls in past yr: 0 0 0 - -  Injury with Fall? 0 0 - - -  Risk for fall due to : Medication side effect - - Medication side effect -  Follow up Falls evaluation completed;Education provided;Falls prevention discussed - -   Falls evaluation completed;Education provided;Falls prevention discussed -    FALL RISK PREVENTION PERTAINING TO THE HOME:  Any stairs in or around the home? No  If so,  are there any without handrails?  N/a Home free of loose throw rugs in walkways, pet beds, electrical cords, etc? Yes  Adequate lighting in your home to reduce risk of falls? Yes   ASSISTIVE DEVICES UTILIZED TO PREVENT FALLS:  Life alert? No  Use of a cane, walker or w/c? No  Grab bars in the bathroom? Yes  Shower chair or bench in shower? No  Elevated toilet seat or a handicapped toilet? Yes   TIMED UP AND GO:  Was the test performed? No .    Gait steady and fast without use of assistive device  Cognitive Function:     6CIT Screen 03/29/2021 12/23/2019 01/27/2019 05/01/2018  What Year? 0 points 0 points 0 points 0 points  What month? 0 points 0 points 0 points 0 points  What time? 0 points 0 points 0 points 0 points  Count back from 20 0 points 2 points 0 points 0 points  Months in reverse 0 points 0 points 0 points 0 points  Repeat phrase 8 points 0 points 0 points 0 points  Total Score 8 2 0 0    Immunizations Immunization History  Administered Date(s) Administered   Fluad Quad(high Dose 65+) 04/29/2019   Influenza,inj,quad, With Preservative 04/08/2017   Influenza-Unspecified 04/08/2013, 04/28/2018, 04/27/2020   Moderna Sars-Covid-2 Vaccination 07/20/2019, 08/17/2019, 05/14/2020   PFIZER Comirnaty(Gray Top)Covid-19 Tri-Sucrose Vaccine 10/20/2020   Pneumococcal Conjugate-13 04/28/2018   Pneumococcal Polysaccharide-23 08/15/2016   Tdap 08/15/2016   Zoster Recombinat (Shingrix) 10/30/2017, 12/25/2017    TDAP status: Up to date  Flu Vaccine status: Due, Education has been provided regarding the importance of this vaccine. Advised may receive this vaccine at local pharmacy or Health Dept. Aware to provide a copy of the vaccination record if obtained from local pharmacy or Health Dept. Verbalized acceptance and understanding.  Pneumococcal vaccine status: Up to date  Covid-19 vaccine status: Completed vaccines  Qualifies for Shingles Vaccine? Yes   Zostavax completed  No   Shingrix Completed?: Yes  Screening Tests Health Maintenance  Topic Date Due   INFLUENZA VACCINE  02/06/2021   COVID-19 Vaccine (5 - Booster for Moderna series) 02/19/2021   MAMMOGRAM  12/21/2021   TETANUS/TDAP  08/15/2026   DEXA SCAN  Completed   Zoster Vaccines- Shingrix  Completed   HPV VACCINES  Aged Out    Health Maintenance  Health Maintenance Due  Topic Date Due   INFLUENZA VACCINE  02/06/2021   COVID-19 Vaccine (5 - Booster for Moderna series) 02/19/2021    Colorectal cancer screening: No longer required.   Mammogram status: Completed 12/21/2020. Repeat every year  Bone Density status: Completed 12/20/2000.   Lung Cancer Screening: (Low Dose CT Chest recommended if Age 30-80 years, 30 pack-year currently smoking OR have quit w/in 15years.) does not qualify.   Lung Cancer Screening Referral: no  Additional Screening:  Hepatitis C Screening: does not qualify  Vision Screening: Recommended annual ophthalmology exams for early detection of glaucoma and other disorders of the eye. Is the patient up to date with their annual eye exam?  Yes  Who is the provider or what is the name of the office in which the patient attends annual eye exams? Dr. Venetia Maxon If pt is not established with a provider, would they like to be referred to a provider to establish care?  No .   Dental Screening: Recommended annual dental exams for proper oral hygiene  Community Resource Referral / Chronic Care Management: CRR required this visit?  No   CCM required this visit?  No      Plan:     I have personally reviewed and noted the following in the patient's chart:   Medical and social history Use of alcohol, tobacco or illicit drugs  Current medications and supplements including opioid prescriptions.  Functional ability and status Nutritional status Physical activity Advanced directives List of other physicians Hospitalizations, surgeries, and ER visits in previous 12  months Vitals Screenings to include cognitive, depression, and falls Referrals and appointments  In addition, I have reviewed and discussed with patient certain preventive protocols, quality metrics, and best practice recommendations. A written personalized care plan for preventive services as well as general preventive health recommendations were provided to patient.     Nickeah E Allen, LPN   03/29/2021   Nurse Notes:       

## 2021-04-10 ENCOUNTER — Telehealth: Payer: Self-pay

## 2021-04-10 NOTE — Chronic Care Management (AMB) (Signed)
Chronic Care Management Pharmacy Assistant   Name: [name]  MRN: [id number] DOB: Jul 08, 1941  Reason for Encounter: Chart Review for CPP visit on 04/12/2021   Conditions to be addressed/monitored: HTN, HLD, DMII, and Hypothyroidism  Recent office visits:  03/29/2021- Stephanie Ponder, LPN- Annual Wellness Visit   02/23/2021- Stephanie Peng, MD (PCP)- Patient presented for follow up visit on Uncontrolled type 2 diabetes mellitus with hyperglycemia (HCC), Essential hypertension, Primary hypothyroidism, Canker sores oral, Spinal stenosis of lumbar region with neurogenic claudication, Class 2 severe obesity due to excess calories with serious comorbidity and body mass index (BMI) of 38.0 to 38.9 in adult [facility]), Drug therapy. Ambulatory Ref[facility] to [facility] placed. Cyclobenzaprine HCL changed from 10 mg to 5 mg 1 tablet three times daily PRN. Return in about 3 months (around 05/26/2021) for diabetes check, bp check.  01/11/2021- Stephanie Peng, MD (PCP)- Patient presented for follow up visit on TMJ (temporomandibular joint syndrome), Chronic left-sided low back pain without sciatica, Anterolisthesis of lumbar spine. MRI Lumbar Spine wo contast ordered. No medication change noted. Return if symptoms worsen or fail to improve  10/13/2020- Stephanie Peng, MD (PCP)- Patient presented for follow up visit on Essential hypertension, Uncontrolled type 2 diabetes mellitus with hyperglycemia (HCC), Primary hypothyroidism, Class 2 severe obesity due to excess calories with serious comorbidity and body mass index (BMI) of 38.0 to 38.9 in adult [facility]). Famotidine changed to 20 mg- 1 tablet daily Guafenesin, Lidocaine 5%, Naproxen 500 mg and Tizanidine 2 mg discontinued. Return in about 4 months (around 02/12/2021) for diabetes check, bp check      Recent consult visits:  03/08/2021- Stephanie Nam, PA-C ([facility])- Notes unavailable.  03/01/2021- Stephanie Nam, PA-C ([facility])-  Notes unavailable.  02/23/2021- Stephanie Abbott, PA ([facility])- Notes unavailable.  Hospital visits:  Medication Reconciliation was completed by comparing discharge summary, patient's EMR and Pharmacy list, and upon discussion with patient.  Admitted to the hospital on 01/08/2021 due to TMJ (temporomandibular joint syndrome). Discharge date was 01/08/2021. Discharged from [facility].    New?Medications Started at [facility] Discharge:?? -started Cyclobenzaprine 10 mg- 1 tablet three times daily prn due to TMJ (temporomandibular joint syndrome).  Medication Changes at Hospital Discharge: -Changed: None  Medications Discontinued at Hospital Discharge: -Stopped Benzonate 100 mg, Diclofenac Sodium 2g topical, Multivitamins and Tizanidine 4 mg  due to patient reported not taking.  Medications that remain the same after Hospital Discharge:??  -All other medications will remain the same.     Admitted to the hospital on 01/02/2021 due to Left flank pain. Discharge date was 01/02/2021. Discharged from [facility].    New?Medications Started at [facility] Discharge:?? -started Diclofenac Sodium 2 g- topically 4 times daily and Tizanidine 4 mg- 1 tablet every 8 hours PRN due to Left flank pain.  Medication Changes at Hospital Discharge: -Changed: None  Medications Discontinued at Hospital Discharge: -Stopped: None  Medications that remain the same after Hospital Discharge:??  -All other medications will remain the same.     Have you seen any other providers since your last visit? No  Any changes in your medications or health? Yes- patient complained of frequent urination at night. Around 3 times at night that interrupts her sleep. Patient reported her blood sugars being a little up the next day but not the case all the time.   Any side effects from any medications? No  Do you have an symptoms or problems not  managed by your medications? Yes- frequent night urination.  Any concerns about your health right now? No other concerns than frequent urination at night.  Has your provider asked that you check blood pressure, blood sugar, or follow special diet at home? Yes  patient checks her blood pressures and blood sugars daily.  Can you think of a goal you would like to reach for your health? Not at this time per patient.   Do you have any problems getting your medications? No  Is there anything that you would like to discuss during the appointment? No  Patient aware to have all medications and supplements along with blood pressure and blood sugar readings available during telephone appointment with [name], Pharm D on 04/12/2021 at 2:45 PM.   Medications: Outpatient Encounter Medications as of 04/10/2021  Medication Sig   Apoaequorin (PREVAGEN PO) Take 1 tablet by mouth daily.   Aspirin (ECOTRIN PO) Take 81 mg by mouth daily.    BENICAR 20 MG tablet TAKE 1 TABLET BY MOUTH  DAILY   bimatoprost (LUMIGAN) 0.01 % SOLN Place 1 drop into both eyes at bedtime.    Blood Glucose Monitoring Suppl (BLOOD GLUCOSE MONITOR SYSTEM) w/Device KIT Use to check blood sugar 3 times a day. Dx code e11.65   CRESTOR 10 MG tablet TAKE 1 TABLET BY MOUTH  DAILY   cyclobenzaprine (FLEXERIL) 5 MG tablet Take 1 tablet (5 mg total) by mouth 3 (three) times daily as needed for muscle spasms.   famotidine (PEPCID) 20 MG tablet Take by mouth.   furosemide (LASIX) 20 MG tablet TAKE 1 TABLET BY MOUTH IN  THE MORNING   glucose blood (ONETOUCH VERIO) test strip Use as instructed to check blood sugars 3 times daily E11.69   Lifitegrast (XIIDRA) 5 % SOLN Apply to eye. xiidra   MAGNESIUM-OXIDE 400 (241.3 Mg) MG tablet TAKE 1 TABLET BY MOUTH EVERY DAY AFTER SUPPER   OneTouch Delica Lancets 46F MISC 1 Stick by Does not apply route 3 (three) times daily before meals.   polyethylene glycol (MIRALAX / GLYCOLAX) packet Take 17 g by  mouth daily.   SYNTHROID 75 MCG tablet TAKE 1 TABLET BY MOUTH  DAILY BEFORE BREAKFAST (Patient taking differently: Monday - Friday)   vitamin B-6 (PYRIDOXINE) 25 MG tablet Take 25 mg by mouth daily.   No facility-administered encounter medications on file as of 04/10/2021.    Care Gaps: Mammogram completed on 12/21/2020. Bone Density completed on 12/20/2020 Annual Wellness Visit completed on 03/29/2021 INFLUENZA VACCINE- Due, last completed 04/27/2020 COVID-19 Vaccine- Overdue since 02/19/2021 (Dose 5 - Booster for Moderna series) last completed 10/20/2020 Imm Admin: PFIZER Comirnaty(Gray Top)Covid-19 Tri-Sucrose Vaccine DM eye exam completed on 09/01/2020, no DM Retinopathy DM foot exam- none found Colonoscopy- not required  Star Rating Drugs: Rosuvastatin 10 mg- Last filled 01/31/2021 for 88 DS at [place], [place] Pharmacist Assistant [phone]

## 2021-04-12 ENCOUNTER — Ambulatory Visit (INDEPENDENT_AMBULATORY_CARE_PROVIDER_SITE_OTHER): Payer: Medicare Other

## 2021-04-12 DIAGNOSIS — I10 Essential (primary) hypertension: Secondary | ICD-10-CM

## 2021-04-12 DIAGNOSIS — E7800 Pure hypercholesterolemia, unspecified: Secondary | ICD-10-CM

## 2021-04-12 NOTE — Progress Notes (Signed)
Chronic Care Management Pharmacy Note  04/19/2021 Name:  [name] MRN:  [id number] DOB:  February 03, 1941  Summary: Patient has questions about vitamins and OTC supplements.   Recommendations/Changes made from today's visit: Recommend patient increase the amount of water she is drinking. Recommend patient receive vaccinations.   Plan: Patient going to increase the amount of water she is drinking to 4 bottles per day. She is going to get her flu shot and booster at the health department    Subjective: [name] is an 80 y.o. year old female who is a primary patient of Glendale Chard, MD.  The CCM team was consulted for assistance with disease management and care coordination needs.    Engaged with patient by telephone for initial visit in response to provider referral for pharmacy case management and/or care coordination services. 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   Consent to Services:  The patient was given the following information about Chronic Care Management services today, agreed to services, and gave verbal consent: 1. CCM service includes personalized support from designated clinical staff supervised by the primary care provider, including individualized plan of care and coordination with other care providers 2. 24/7 contact phone numbers for assistance for urgent and routine care needs. 3. Service will only be billed when office clinical staff spend 20 minutes or more in a month to coordinate care. 4. Only one practitioner may furnish and bill the service in a calendar month. 5.The patient may stop CCM services at any time (effective at the end of the month) by phone call to the office staff. 6. The patient will be responsible for cost sharing (co-pay) of up to 20% of the service fee (after annual deductible is met). Patient agreed to services and consent obtained.  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 Ambulatory Referral to [facility] Coordination placed. Cyclobenzaprine HCL changed from 10 mg to 5 mg 1 tablet three times daily PRN. Return in about 3 months (around 05/26/2021) for diabetes check, bp check.   01/11/2021- Glendale Chard, MD (PCP)- Patient presented for follow up visit on TMJ (temporomandibular joint syndrome), Chronic left-sided low back pain without sciatica, Anterolisthesis of lumbar spine. MRI Lumbar Spine wo contast ordered. No medication change noted. Return if symptoms worsen or fail to improve   10/13/2020- Glendale Chard, MD (PCP)- Patient presented for follow up visit on Essential hypertension, Uncontrolled type 2 diabetes mellitus with hyperglycemia ([place]), Primary hypothyroidism, Class 2 severe obesity due to excess calories with serious comorbidity and body mass index (BMI) of 38.0 to 38.9 in adult [facility]). Famotidine changed to 20 mg- 1 tablet daily Guafenesin, Lidocaine 5%, Naproxen 500 mg and Tizanidine 2 mg discontinued. Return in about 4 months (around 02/12/2021) for diabetes check, bp check                          Recent consult visits:  03/08/2021- Stephanie Halls, PA-C ([facility])- Notes unavailable.   03/01/2021- Stephanie Halls, PA-C ([facility])- Notes unavailable.   02/23/2021- Stephanie Duty, PA ([facility])- Notes unavailable.   Hospital visits:  Medication Reconciliation was completed by comparing  discharge summary, patient's EMR and Pharmacy list, and upon discussion with patient.   Admitted to the hospital on 01/08/2021 due to TMJ (temporomandibular joint syndrome). Discharge date was 01/08/2021. Discharged from [place]?Medications Started at [facility] Discharge:?? -started Cyclobenzaprine 10 mg- 1 tablet three times daily prn due to TMJ (temporomandibular joint syndrome).   Medication Changes at Hospital Discharge: -Changed: None   Medications Discontinued at Hospital Discharge: -Stopped Benzonate 100 mg, Diclofenac Sodium 2g topical, Multivitamins and Tizanidine 4 mg  due to patient reported not taking.   Medications that remain the same after Hospital Discharge:??  -All other medications will remain the same.       Admitted to the hospital on 01/02/2021 due to Left flank pain. Discharge date was 01/02/2021. Discharged from [place]?Medications Started at [facility] Discharge:?? -started Diclofenac Sodium 2 g- topically 4 times daily and Tizanidine 4 mg- 1 tablet every 8 hours PRN due to Left flank pain.   Medication Changes at Hospital Discharge: -Changed: None   Medications Discontinued at Hospital Discharge: -Stopped: None   Medications that remain the same after Hospital Discharge:??  -All other medications will remain the same.     Objective:  Lab Results  Component Value Date   CREATININE 0.73 02/23/2021   BUN 19 02/23/2021   GFRNONAA >60 01/02/2021   GFRAA 92 07/13/2020   NA 142 02/23/2021   K 4.4 02/23/2021   CALCIUM 10.7 (H) 02/23/2021   CO2 23 02/23/2021   GLUCOSE 116 (H) 02/23/2021    Lab Results  Component Value Date/Time   HGBA1C 6.7 (H) 02/23/2021 10:40 AM   HGBA1C 6.7 (H) 10/13/2020 12:43 PM   HGBA1C 6.8 10/31/2017 12:00 AM   MICROALBUR 30 07/13/2020 11:10 AM   MICROALBUR 30 06/23/2019 10:43 AM    Last diabetic Eye exam:  Lab Results   Component Value Date/Time   HMDIABEYEEXA No Retinopathy 09/01/2020 12:00 AM    Last diabetic Foot exam: No results found for: HMDIABFOOTEX   Lab Results  Component Value Date   CHOL 158 03/16/2020   HDL 58 03/16/2020   LDLCALC 86 03/16/2020   TRIG 71 03/16/2020   CHOLHDL 2.7 03/16/2020    Hepatic Function Latest Ref Rng & Units 02/23/2021 01/02/2021 10/13/2020  Total Protein 6.0 - 8.5 g/dL 6.6 6.7 6.0  Albumin 3.6 - 4.6 g/dL 4.3 4.0 3.9  AST 0 - 40 IU/L $Remov'22 24 28  'eEBcQS$ ALT 0 - 32 IU/L 33(H) 30 38(H)  Alk Phosphatase 44 - 121 IU/L 117 95 122(H)  Total Bilirubin 0.0 - 1.2 mg/dL 0.6 0.7 0.6    Lab Results  Component Value Date/Time   TSH 1.030 10/13/2020 12:43 PM   TSH 0.865 07/13/2020 03:50 PM   FREET4 1.73 07/13/2020 03:50 PM   FREET4 1.48 06/23/2019 12:32 PM    CBC Latest Ref Rng & Units 01/02/2021 10/07/2020 07/13/2020  WBC 4.0 - 10.5 K/uL 4.5 5.7 4.9  Hemoglobin 12.0 - 15.0 g/dL 15.6(H) 14.6 15.4  Hematocrit 36.0 - 46.0 % 45.5 43.3 45.6  Platelets 150 - 400 K/uL 178 162 181    Lab Results  Component Value Date/Time   VD25OH 49.1 07/13/2020 03:50 PM  Clinical ASCVD: No  The ASCVD Risk score (Arnett DK, et al., 2019) failed to calculate for the following reasons:   The 2019 ASCVD risk score is only valid for ages 69 to 1    Depression screen PHQ 2/9 03/29/2021 04/28/2020 12/23/2019  Decreased Interest 0 0 0  Down, Depressed, Hopeless 0 0 0  PHQ - 2 Score 0 0 0  Altered sleeping - - 3  Tired, decreased energy - - 0  Change in appetite - - 0  Feeling bad or failure about yourself  - - 0  Trouble concentrating - - 0  Moving slowly or fidgety/restless - - 0  Suicidal thoughts - - 0  PHQ-9 Score - - 3  Difficult doing work/chores - - Not difficult at all     Social History   Tobacco Use  Smoking Status Never  Smokeless Tobacco Never   BP Readings from Last 3 Encounters:  03/29/21 128/62  02/23/21 118/60  01/11/21 120/68   Pulse Readings from Last 3 Encounters:   03/29/21 84  02/23/21 74  01/11/21 90   Wt Readings from Last 3 Encounters:  03/29/21 212 lb 12.8 oz (96.5 kg)  02/23/21 212 lb 12.8 oz (96.5 kg)  01/11/21 216 lb 9.6 oz (98.2 kg)   BMI Readings from Last 3 Encounters:  03/29/21 39.17 kg/m  02/23/21 38.92 kg/m  01/11/21 38.37 kg/m    Assessment/Interventions: Review of patient past medical history, allergies, medications, health status, including review of consultants reports, laboratory and other test data, was performed as part of comprehensive evaluation and provision of chronic care management services.   SDOH:  (Social Determinants of Health) assessments and interventions performed: Yes  SDOH Screenings   Alcohol Screen: Not on file  Depression (PHQ2-9): Low Risk    PHQ-2 Score: 0  Financial Resource Strain: Low Risk    Difficulty of Paying Living Expenses: Not hard at all  Food Insecurity: No Food Insecurity   Worried About Charity fundraiser in the Last Year: Never true   Ran Out of Food in the Last Year: Never true  Housing: Not on file  Physical Activity: Sufficiently Active   Days of Exercise per Week: 4 days   Minutes of Exercise per Session: 40 min  Social Connections: Not on file  Stress: No Stress Concern Present   Feeling of Stress : Not at all  Tobacco Use: Low Risk    Smoking Tobacco Use: Never   Smokeless Tobacco Use: Never  Transportation Needs: No Transportation Needs   Lack of Transportation (Medical): No   Lack of Transportation (Non-Medical): No    CCM Care Plan  Allergies  Allergen Reactions   Codeine Nausea And Vomiting   Macrobid [Nitrofurantoin Macrocrystal]     RASH   Tramadol Nausea And Vomiting    Medications Reviewed Today     Reviewed by Mayford Knife, RPH (Pharmacist) on 04/12/21 at [place] List Status: <None>   Medication Order Taking? Sig Documenting Provider Last Dose Status Informant  Apoaequorin (PREVAGEN PO) [id number] Yes Take 1 tablet by mouth daily.  [provider] Taking Active Self  Aspirin (ECOTRIN PO) [id number] Yes Take 81 mg by mouth daily.  [provider] Taking Active   BENICAR 20 MG tablet [id number] Yes TAKE 1 TABLET BY MOUTH  DAILY Glendale Chard, MD Taking Active   bimatoprost (LUMIGAN) 0.01 % SOLN [id number] Yes Place 1 drop into both eyes at bedtime.  [provider] Taking Active Self  Blood Glucose Monitoring Suppl (BLOOD GLUCOSE MONITOR SYSTEM) w/Device KIT [id number] Yes Use to check blood sugar 3 times a day. Dx code e11.65 Glendale Chard, MD Taking Active   CRESTOR 10 MG tablet [id number] Yes TAKE 1 TABLET BY MOUTH  DAILY Glendale Chard, MD Taking Active   cyclobenzaprine (FLEXERIL) 5 MG tablet [id number] Yes Take 1 tablet (5 mg total) by mouth 3 (three) times daily as needed for muscle spasms. Glendale Chard, MD Taking Active   famotidine (PEPCID) 20 MG tablet [id number] Yes Take by mouth. [provider] Taking Active   furosemide (LASIX) 20 MG tablet [id number] Yes TAKE 1 TABLET BY MOUTH IN  THE Wende Neighbors, MD Taking Active   glucose blood [facility] VERIO) test strip [id number] Yes Use as instructed to check blood sugars 3 times daily E11.69 Glendale Chard, MD Taking Active   Lifitegrast Shirley Friar) 5 % Bailey Mech [id number] Yes Apply to eye. xiidra [provider] Taking Active   MAGNESIUM-OXIDE 400 (241.3 Mg) MG tablet [id number] Yes TAKE 1 TABLET BY MOUTH EVERY DAY AFTER SUPPER Glendale Chard, MD Taking Active   OneTouch Delica Lancets 26Z MISC [id number] Yes 1 Stick by Does not apply route 3 (three) times daily before meals. Glendale Chard, MD Taking Active   polyethylene glycol [facility]) packet [id number] Yes Take 17 g by mouth daily. [provider] Taking Active Self  SYNTHROID 75 MCG tablet [id number] Yes TAKE 1 TABLET BY MOUTH  DAILY BEFORE BREAKFAST  Patient taking differently: Monday - Friday   Glendale Chard, MD Taking Active   vitamin B-6 (PYRIDOXINE) 25 MG  tablet [id number] Yes Take 25 mg by mouth daily. [provider] Taking Active             Patient Active Problem List   Diagnosis Date Noted   Class 2 severe obesity due to excess calories with serious comorbidity and body mass index (BMI) of 39.0 to 39.9 in adult 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   Vertigo 01/06/2014   Postoperative anemia due to acute blood loss 12/30/2012   OA (osteoarthritis) of knee 12/29/2012    Immunization History  Administered Date(s) Administered   Fluad Quad(high Dose 65+) 04/29/2019   Influenza,inj,quad, With Preservative 04/08/2017   Influenza-Unspecified 04/08/2013, 04/28/2018, 04/27/2020   Moderna Sars-Covid-2 Vaccination 07/20/2019, 08/17/2019, 05/14/2020   PFIZER Comirnaty(Gray Top)Covid-19 Tri-Sucrose Vaccine 10/20/2020   Pneumococcal Conjugate-13 04/28/2018   Pneumococcal Polysaccharide-23 08/15/2016   Tdap 08/15/2016   Zoster Recombinat (Shingrix) 10/30/2017, 12/25/2017    Conditions to be addressed/monitored:  Hypertension and Hyperlipidemia  Care Plan : [place]  Updates made by Mayford Knife, [place] since 04/19/2021 12:00 AM     Problem: HTN, HLD   Priority: High     Long-Range Goal: Disease Management   This Visit's Progress: On track  Note:      Current Barriers:  Unable to independently monitor therapeutic efficacy  Pharmacist Clinical Goal(s):  Patient will achieve adherence to monitoring guidelines and medication adherence to achieve therapeutic efficacy through collaboration with PharmD and provider.   Interventions: 1:1 collaboration with Glendale Chard, MD regarding development and update of comprehensive plan of care as evidenced by provider attestation  and co-signature Inter-disciplinary care team collaboration (see longitudinal plan of care) Comprehensive medication review performed;  medication list updated in electronic medical record  Hypertension (BP goal <130/80) -Controlled -Current treatment: Benicar 20 mg tablet once per day -Medications previously tried: none noted   -Current home readings: 131/67, 129/67, 131/54 -Current dietary habits: patient reports that she does not use a lot of salt in her food, she does not eat processed foods, and she does not add salt to her food.  -Current exercise habits: she does water aerobics 4 times per week for 45 minutes and stretching exercises at home -Denies hypotensive/hypertensive symptoms -Educated on Daily salt intake goal < 2300 mg; Exercise goal of 150 minutes per week; -Counseled to monitor BP at home at least once per week, document, and provide log at future appointments -Counseled on diet and exercise extensively Recommended to continue current medication  Hyperlipidemia: (LDL goal < 70) -Not ideally controlled -Current treatment: Crestor 10 mg tablet once per day  -Medications previously tried: none noted   -Current dietary patterns: patient is going to  -Current exercise habits: please see hypertension  -Educated on Cholesterol goals;  Benefits of statin for ASCVD risk reduction; Importance of limiting foods high in cholesterol; Exercise goal of 150 minutes per week; -Recommended to continue current medication  Health Maintenance -Vaccine gaps: COVID-19 Booster, Influenza vaccine   -Educated on Herbal supplement research is limited and benefits usually cannot be proven Cost vs benefit of each product must be carefully weighed by individual consumer Supplements may interfere with prescription drugs -Patient is satisfied with current therapy and denies issues -Recommended to continue current medication   Patient Goals/Self-Care Activities Patient will:  - take  medications as prescribed  Follow Up Plan: The patient has been provided with contact information for the care management team and has been advised to call with any health related questions or concerns.        Medication Assistance: None required.  Patient affirms current coverage meets needs.  Compliance/Adherence/Medication fill history: Care Gaps: Influenza Vaccine COVID-19 Vaccine   Star-Rating Drugs: Benicar 20 mg tablet Crestor 10 mg tablet  Patient's preferred pharmacy is:  [employer] [employer]  ([place], [place] [facility] [place] [address] [place] [address] Phone: [phone] Fax: [id number] - [place], [place] [address] Phone: [phone] Fax: [phone]  [place] [facility] ([facility]) - [place], [address] [place] [address] Phone: [phone] Fax: [phone]  Uses pill box? Yes she sets up her box for 6 weeks Pt endorses 95% compliance  We discussed: Current pharmacy is preferred with insurance plan and patient is satisfied with pharmacy services Patient decided to: Continue current medication management strategy  Care Plan and Follow Up Patient Decision:  Patient agrees to Care Plan and Follow-up.  Plan: Telephone follow up appointment with care management team member scheduled for:  10/17/2021  and The patient has been provided with contact information for the care management team and has been advised to call with any health related questions or concerns.   [name], PharmD Clinical Pharmacist [facility] [phone]

## 2021-04-19 NOTE — Patient Instructions (Addendum)
Visit Information It was great speaking with you today!  Please let me know if you have any questions about our visit.   Goals Addressed             This Visit's Progress    Manage My Medicine       Timeframe:  Long-Range Goal Priority:  High Start Date:                             Expected End Date:                       Follow Up Date 10/17/2021    - call for medicine refill 2 or 3 days before it runs out - call if I am sick and can't take my medicine - keep a list of all the medicines I take; vitamins and herbals too - use a pillbox to sort medicine    Why is this important?   These steps will help you keep on track with your medicines.   Notes:         Patient Care Plan: CCM Pharmacy Care Plan     Problem Identified: HTN, HLD   Priority: High     Long-Range Goal: Disease Management   This Visit's Progress: On track  Note:      Current Barriers:  Unable to independently monitor therapeutic efficacy  Pharmacist Clinical Goal(s):  Patient will achieve adherence to monitoring guidelines and medication adherence to achieve therapeutic efficacy through collaboration with PharmD and provider.   Interventions: 1:1 collaboration with Glendale Chard, MD regarding development and update of comprehensive plan of care as evidenced by provider attestation and co-signature Inter-disciplinary care team collaboration (see longitudinal plan of care) Comprehensive medication review performed; medication list updated in electronic medical record  Hypertension (BP goal <130/80) -Controlled -Current treatment: Benicar 20 mg tablet once per day -Medications previously tried: none noted   -Current home readings: 131/67, 129/67, 131/54 -Current dietary habits: patient reports that she does not use a lot of salt in her food, she does not eat processed foods, and she does not add salt to her food.  -Current exercise habits: she does water aerobics 4 times per week for 45  minutes and stretching exercises at home -Denies hypotensive/hypertensive symptoms -Educated on Daily salt intake goal < 2300 mg; Exercise goal of 150 minutes per week; -Counseled to monitor BP at home at least once per week, document, and provide log at future appointments -Counseled on diet and exercise extensively Recommended to continue current medication  Hyperlipidemia: (LDL goal < 70) -Not ideally controlled -Current treatment: Crestor 10 mg tablet once per day  -Medications previously tried: none noted   -Current dietary patterns: patient is going to  -Current exercise habits: please see hypertension  -Educated on Cholesterol goals;  Benefits of statin for ASCVD risk reduction; Importance of limiting foods high in cholesterol; Exercise goal of 150 minutes per week; -Recommended to continue current medication  Health Maintenance -Vaccine gaps: COVID-19 Booster, Influenza vaccine   -Educated on Herbal supplement research is limited and benefits usually cannot be proven Cost vs benefit of each product must be carefully weighed by individual consumer Supplements may interfere with prescription drugs -Patient is satisfied with current therapy and denies issues    Patient Goals/Self-Care Activities Patient will:  - take medications as prescribed  Follow Up Plan: The patient has been provided with contact information for the care  management team and has been advised to call with any health related questions or concerns.        Ms. Houseworth was given information about Chronic Care Management services today including:  CCM service includes personalized support from designated clinical staff supervised by her physician, including individualized plan of care and coordination with other care providers 24/7 contact phone numbers for assistance for urgent and routine care needs. Standard insurance, coinsurance, copays and deductibles apply for chronic care management only during  months in which we provide at least 20 minutes of these services. Most insurances cover these services at 100%, however patients may be responsible for any copay, coinsurance and/or deductible if applicable. This service may help you avoid the need for more expensive face-to-face services. Only one practitioner may furnish and bill the service in a calendar month. The patient may stop CCM services at any time (effective at the end of the month) by phone call to the office staff.  Patient agreed to services and verbal consent obtained.   The patient verbalized understanding of instructions, educational materials, and care plan provided today and agreed to receive a mailed copy of patient instructions, educational materials, and care plan.   [name], PharmD Clinical Pharmacist [facility] [phone]

## 2021-04-23 ENCOUNTER — Other Ambulatory Visit: Payer: Self-pay | Admitting: Internal Medicine

## 2021-04-30 ENCOUNTER — Other Ambulatory Visit: Payer: Self-pay | Admitting: Internal Medicine

## 2021-05-08 DIAGNOSIS — E7800 Pure hypercholesterolemia, unspecified: Secondary | ICD-10-CM

## 2021-05-08 DIAGNOSIS — I10 Essential (primary) hypertension: Secondary | ICD-10-CM

## 2021-05-30 ENCOUNTER — Ambulatory Visit (INDEPENDENT_AMBULATORY_CARE_PROVIDER_SITE_OTHER): Payer: Medicare Other | Admitting: Nurse Practitioner

## 2021-05-30 ENCOUNTER — Encounter: Payer: Self-pay | Admitting: Nurse Practitioner

## 2021-05-30 ENCOUNTER — Telehealth: Payer: Self-pay | Admitting: Cardiology

## 2021-05-30 ENCOUNTER — Emergency Department (HOSPITAL_BASED_OUTPATIENT_CLINIC_OR_DEPARTMENT_OTHER): Payer: Medicare Other

## 2021-05-30 ENCOUNTER — Encounter (HOSPITAL_BASED_OUTPATIENT_CLINIC_OR_DEPARTMENT_OTHER): Payer: Self-pay | Admitting: *Deleted

## 2021-05-30 ENCOUNTER — Emergency Department (HOSPITAL_BASED_OUTPATIENT_CLINIC_OR_DEPARTMENT_OTHER)
Admission: EM | Admit: 2021-05-30 | Discharge: 2021-05-30 | Disposition: A | Discharge status: Home / Self Care | Payer: Medicare Other | Attending: Emergency Medicine | Admitting: Emergency Medicine

## 2021-05-30 ENCOUNTER — Other Ambulatory Visit: Payer: Self-pay

## 2021-05-30 VITALS — BP 122/72 | HR 81 | Temp 98.1°F | Ht 61.0 in | Wt 212.8 lb

## 2021-05-30 DIAGNOSIS — Z20822 Contact with and (suspected) exposure to covid-19: Secondary | ICD-10-CM | POA: Diagnosis not present

## 2021-05-30 DIAGNOSIS — Z7982 Long term (current) use of aspirin: Secondary | ICD-10-CM | POA: Diagnosis not present

## 2021-05-30 DIAGNOSIS — R55 Syncope and collapse: Secondary | ICD-10-CM | POA: Diagnosis not present

## 2021-05-30 DIAGNOSIS — Z79899 Other long term (current) drug therapy: Secondary | ICD-10-CM | POA: Diagnosis not present

## 2021-05-30 DIAGNOSIS — Z96651 Presence of right artificial knee joint: Secondary | ICD-10-CM | POA: Diagnosis not present

## 2021-05-30 DIAGNOSIS — E039 Hypothyroidism, unspecified: Secondary | ICD-10-CM | POA: Diagnosis not present

## 2021-05-30 DIAGNOSIS — R519 Headache, unspecified: Secondary | ICD-10-CM

## 2021-05-30 DIAGNOSIS — E119 Type 2 diabetes mellitus without complications: Secondary | ICD-10-CM | POA: Diagnosis not present

## 2021-05-30 DIAGNOSIS — R600 Localized edema: Secondary | ICD-10-CM | POA: Diagnosis not present

## 2021-05-30 DIAGNOSIS — R42 Dizziness and giddiness: Secondary | ICD-10-CM

## 2021-05-30 DIAGNOSIS — I10 Essential (primary) hypertension: Secondary | ICD-10-CM | POA: Diagnosis not present

## 2021-05-30 DIAGNOSIS — N390 Urinary tract infection, site not specified: Secondary | ICD-10-CM | POA: Insufficient documentation

## 2021-05-30 DIAGNOSIS — Z6841 Body Mass Index (BMI) 40.0 and over, adult: Secondary | ICD-10-CM

## 2021-05-30 DIAGNOSIS — R5383 Other fatigue: Secondary | ICD-10-CM | POA: Diagnosis present

## 2021-05-30 DIAGNOSIS — W19XXXA Unspecified fall, initial encounter: Secondary | ICD-10-CM | POA: Diagnosis not present

## 2021-05-30 LAB — CBC WITH DIFFERENTIAL/PLATELET
Abs Immature Granulocytes: 0.02 10*3/uL (ref 0.00–0.07)
Basophils Absolute: 0 10*3/uL (ref 0.0–0.1)
Basophils Relative: 1 %
Eosinophils Absolute: 0.1 10*3/uL (ref 0.0–0.5)
Eosinophils Relative: 2 %
HCT: 48.2 % — ABNORMAL HIGH (ref 36.0–46.0)
Hemoglobin: 16.3 g/dL — ABNORMAL HIGH (ref 12.0–15.0)
Immature Granulocytes: 0 %
Lymphocytes Relative: 44 %
Lymphs Abs: 2.3 10*3/uL (ref 0.7–4.0)
MCH: 31.4 pg (ref 26.0–34.0)
MCHC: 33.8 g/dL (ref 30.0–36.0)
MCV: 92.9 fL (ref 80.0–100.0)
Monocytes Absolute: 0.6 10*3/uL (ref 0.1–1.0)
Monocytes Relative: 11 %
Neutro Abs: 2.2 10*3/uL (ref 1.7–7.7)
Neutrophils Relative %: 42 %
Platelets: 186 10*3/uL (ref 150–400)
RBC: 5.19 MIL/uL — ABNORMAL HIGH (ref 3.87–5.11)
RDW: 13.7 % (ref 11.5–15.5)
WBC: 5.3 10*3/uL (ref 4.0–10.5)
nRBC: 0 % (ref 0.0–0.2)

## 2021-05-30 LAB — COMPREHENSIVE METABOLIC PANEL
ALT: 32 U/L (ref 0–44)
AST: 25 U/L (ref 15–41)
Albumin: 4 g/dL (ref 3.5–5.0)
Alkaline Phosphatase: 109 U/L (ref 38–126)
Anion gap: 9 (ref 5–15)
BUN: 17 mg/dL (ref 8–23)
CO2: 23 mmol/L (ref 22–32)
Calcium: 10.4 mg/dL — ABNORMAL HIGH (ref 8.9–10.3)
Chloride: 105 mmol/L (ref 98–111)
Creatinine, Ser: 0.68 mg/dL (ref 0.44–1.00)
GFR, Estimated: >60 mL/min (ref >60)
Glucose, Bld: 104 mg/dL — ABNORMAL HIGH (ref 70–99)
Potassium: 3.9 mmol/L (ref 3.5–5.1)
Sodium: 137 mmol/L (ref 135–145)
Total Bilirubin: 0.7 mg/dL (ref 0.3–1.2)
Total Protein: 7 g/dL (ref 6.5–8.1)

## 2021-05-30 LAB — URINALYSIS, ROUTINE W REFLEX MICROSCOPIC
Bilirubin Urine: NEGATIVE
Glucose, UA: NEGATIVE mg/dL
Ketones, ur: 40 mg/dL — AB
Nitrite: NEGATIVE
Protein, ur: NEGATIVE mg/dL
Specific Gravity, Urine: >1.03 — ABNORMAL HIGH (ref 1.005–1.030)
pH: 5.5 (ref 5.0–8.0)

## 2021-05-30 LAB — TROPONIN I (HIGH SENSITIVITY)
Troponin I (High Sensitivity): 12 ng/L (ref <18)
Troponin I (High Sensitivity): 13 ng/L (ref <18)

## 2021-05-30 LAB — RESP PANEL BY RT-PCR (FLU A&B, COVID) ARPGX2
Influenza A by PCR: NEGATIVE
Influenza B by PCR: NEGATIVE
SARS Coronavirus 2 by RT PCR: NEGATIVE

## 2021-05-30 LAB — MAGNESIUM: Magnesium: 2.1 mg/dL (ref 1.7–2.4)

## 2021-05-30 LAB — PROTIME-INR
INR: 1 (ref 0.8–1.2)
Prothrombin Time: 13 seconds (ref 11.4–15.2)

## 2021-05-30 LAB — URINALYSIS, MICROSCOPIC (REFLEX)

## 2021-05-30 LAB — BRAIN NATRIURETIC PEPTIDE: B Natriuretic Peptide: 36.6 pg/mL (ref 0.0–100.0)

## 2021-05-30 MED ORDER — CEPHALEXIN 500 MG PO CAPS: 500.0000 mg | ORAL_CAPSULE | Freq: Two times a day (BID) | ORAL | 0 refills | Status: AC

## 2021-05-30 NOTE — ED Notes (Signed)
ED Provider at bedside. 

## 2021-05-30 NOTE — ED Notes (Signed)
XR at bedside

## 2021-05-30 NOTE — Discharge Instructions (Signed)
Your history, exam, work-up today was concerning for a syncopal episode related to fatigue and dehydration and a possible urinary tract infection was seen.  Please take the antibiotics to clear out the UTI, if any resistant bacteria are discovered they will call you.  I spoke to cardiology about her EKG and work-up and they feel you are safe for discharge home to follow-up with them in clinic this week.  They should call you but if not, please call the office of Dr. Einar Gip to discuss follow-up.  If any symptoms change or worsen, please return to the nearest emergency department.  Please rest and stay hydrated and be careful standing up quickly.

## 2021-05-30 NOTE — ED Triage Notes (Signed)
Sent here from PMD for syncope episode this am, c/o dizziness

## 2021-05-30 NOTE — ED Provider Notes (Signed)
[facility] EMERGENCY DEPARTMENT Provider Note   CSN: [id number] Arrival date & time: 05/30/21  1709     History Chief Complaint  Patient presents with   Loss of Consciousness    [name] is a 80 y.o. female.  The history is provided by the patient and medical records. No language interpreter was used.  Loss of Consciousness Episode history:  Single Most recent episode:  Today Timing:  Rare Progression:  Resolved Chronicity:  New Witnessed: no   Relieved by:  Nothing Worsened by:  Nothing Ineffective treatments:  None tried Associated symptoms: chest pain (right upper) and malaise/fatigue   Associated symptoms: no confusion, no diaphoresis, no dizziness, no fever, no headaches, no nausea, no palpitations, no shortness of breath, no vomiting and no weakness   Risk factors: no congenital heart disease and no coronary artery disease       Past Medical History:  Diagnosis Date   Arthritis    RIGHT KNEE PAIN AND OA   Chest pain 05/03/2015   Diabetes mellitus without complication (HCC)    Elevated cholesterol    GERD (gastroesophageal reflux disease)    Hypertension    Hypothyroidism    IBS (irritable bowel syndrome)    Rotator cuff disorder    BOTH SHOULDERS - NO SURGERY - AND STATES LIMITATIONS IN ARM MOVEMENT   Shortness of breath 05/03/2015   Swelling    CHRONIC SWELLING RIGHT HAND AND BOTH FEET AND BOTH LEGS   Vertigo     Patient Active Problem List   Diagnosis Date Noted   Class 2 severe obesity due to excess calories with serious comorbidity and body mass index (BMI) of 39.0 to 39.9 in adult [facility]) 07/13/2020   Hypothyroidism 04/06/2018   Hyperlipidemia 04/06/2018   Essential hypertension 04/06/2018   Abnormal glucose 04/06/2018   Chest pain 05/03/2015   Shortness of breath 05/03/2015   Dizziness and giddiness 05/18/2014   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   Vertigo 01/06/2014   Postoperative anemia due to acute blood loss 12/30/2012   OA  (osteoarthritis) of knee 12/29/2012    Past Surgical History:  Procedure Laterality Date   ABDOMINAL HYSTERECTOMY  1985   BREAST SURGERY  1985   RIGHT BREAST BIOPSY - BENIGN   CARPAL TUNNEL RELEASE Left 02/05/2019   Procedure: LEFT CARPAL TUNNEL RELEASE;  Surgeon: Daryll Brod, MD;  Location: [facility];  Service: Orthopedics;  Laterality: Left;   [place]   BILATERAL CATARACT EXTRACTIONS; SURGERY FOR MACULAR HOLE    HEMORRHOID SURGERY  1975   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     Family History  Problem Relation Age of Onset   Diabetes Mother    Dementia Mother    Diabetes Father    Diabetes Sister    Breast cancer Sister    Kidney cancer Sister    Lung cancer Sister    Lung cancer Maternal Aunt    Pancreatic cancer Maternal Aunt    Cancer Other     Social History   Tobacco Use   Smoking status: Never   Smokeless tobacco: Never  Vaping Use   Vaping Use: Never used  Substance Use Topics   Alcohol use: Not Currently    Comment: OCCAS  ALCOHOL   Drug use: No    Home Medications Prior to Admission medications   Medication Sig Start Date End Date Taking? Authorizing Provider  Apoaequorin (PREVAGEN PO) Take 1 tablet by mouth daily.    [provider]  Aspirin (ECOTRIN PO) Take 81 mg by mouth daily.     [provider]  BENICAR 20 MG tablet TAKE 1 TABLET BY MOUTH  DAILY 03/10/21   Glendale Chard, MD  bimatoprost (LUMIGAN) 0.01 % SOLN Place 1 drop into both eyes at bedtime.     [provider]  Blood Glucose Monitoring Suppl (BLOOD GLUCOSE MONITOR SYSTEM) w/Device KIT Use to check blood sugar 3 times a day. Dx code e11.65 02/23/21   Glendale Chard, MD  CRESTOR 10 MG tablet TAKE 1 TABLET BY MOUTH  DAILY 04/24/21    Glendale Chard, MD  cyclobenzaprine (FLEXERIL) 5 MG tablet Take 1 tablet (5 mg total) by mouth 3 (three) times daily as needed for muscle spasms. 02/23/21   Glendale Chard, MD  famotidine (PEPCID) 20 MG tablet Take by mouth.    [provider]  furosemide (LASIX) 20 MG tablet TAKE 1 TABLET BY MOUTH IN  THE MORNING 01/06/21   Glendale Chard, MD  glucose blood [facility] VERIO) test strip Use as instructed to check blood sugars 3 times daily E11.69 02/27/21   Glendale Chard, MD  Lancets [facility] DELICA PLUS [id number]) [place] USE ONE LANCET 3 TIMES  DAILY BEFORE MEALS 05/01/21   Glendale Chard, MD  Lifitegrast [facility]ar) 5 % SOLN Apply to eye. xiidra    [provider]  MAGNESIUM-OXIDE 400 (241.3 Mg) MG tablet TAKE 1 TABLET BY MOUTH EVERY DAY AFTER SUPPER 10/18/20   Glendale Chard, MD  Multiple Vitamins-Minerals (DIABETES SUPPORT PO) Take 1 tablet by mouth daily.    [provider]  polyethylene glycol (MIRALAX / GLYCOLAX) packet Take 17 g by mouth daily.    [provider]  SYNTHROID 75 MCG tablet TAKE 1 TABLET BY MOUTH  DAILY BEFORE BREAKFAST Patient taking differently: Monday - Friday 10/04/20   Glendale Chard, MD  vitamin B-6 (PYRIDOXINE) 25 MG tablet Take 25 mg by mouth daily.    [provider]    Allergies    Codeine, Macrobid [nitrofurantoin macrocrystal], and Tramadol  Review of Systems   Review of Systems  Constitutional:  Positive for fatigue and malaise/fatigue. Negative for chills, diaphoresis and fever.  HENT:  Negative for congestion.   Respiratory:  Positive for cough. Negative for chest tightness, shortness of breath and wheezing.   Cardiovascular:  Positive for chest pain (right upper) and syncope. Negative for palpitations.  Gastrointestinal:  Negative for abdominal pain, constipation, diarrhea, nausea and vomiting.  Genitourinary:  Negative for dysuria, flank pain and frequency.  Musculoskeletal:  Negative for back pain.  Neurological:   Positive for syncope and light-headedness. Negative for dizziness, weakness, numbness and headaches.  Psychiatric/Behavioral:  Negative for agitation and confusion.   All other systems reviewed and are negative.  Physical Exam Updated Vital Signs BP (!) 160/97 (BP Location: Right Arm)   Pulse 76   Temp (!) 97.5 F (36.4 C) (Oral)   Resp 18   Ht $R'5\' 3"'su$  (1.6 m)   Wt 96.2 kg   SpO2 100%   BMI 37.55 kg/m   Physical Exam Vitals and nursing note reviewed.  Constitutional:      General: She is not in acute distress.    Appearance: She is well-developed. She is not ill-appearing, toxic-appearing or diaphoretic.  HENT:     Head: Normocephalic and atraumatic.     Mouth/Throat:     Mouth: Mucous membranes are moist.     Pharynx: No oropharyngeal exudate or posterior oropharyngeal erythema.  Eyes:     Conjunctiva/sclera: Conjunctivae normal.  Cardiovascular:     Rate and Rhythm: Normal rate and regular rhythm.     Heart sounds: No murmur heard. Pulmonary:     Effort: Pulmonary effort is normal. No respiratory distress.     Breath sounds: Normal breath sounds. No wheezing, rhonchi or rales.  Chest:     Chest wall: No tenderness.  Abdominal:     General: Abdomen is flat.     Palpations: Abdomen is soft.     Tenderness: There is no abdominal tenderness. There is no right CVA tenderness, left CVA tenderness, guarding or rebound.  Musculoskeletal:        General: No swelling or tenderness.     Cervical back: Neck supple. No tenderness.     Right lower leg: No edema.     Left lower leg: No edema.  Skin:    General: Skin is warm and dry.     Capillary Refill: Capillary refill takes less than 2 seconds.     Findings: No erythema.  Neurological:     General: No focal deficit present.     Mental Status: She is alert.     Sensory: No sensory deficit.     Motor: No weakness.  Psychiatric:        Mood and Affect: Mood normal.    ED Results / Procedures / Treatments   Labs (all labs  ordered are listed, but only abnormal results are displayed) Labs Reviewed  CBC WITH DIFFERENTIAL/PLATELET - Abnormal; Notable for the following components:      Result Value   RBC 5.19 (*)    Hemoglobin 16.3 (*)    HCT 48.2 (*)    All other components within normal limits  COMPREHENSIVE METABOLIC PANEL - Abnormal; Notable for the following components:   Glucose, Bld 104 (*)    Calcium 10.4 (*)    All other components within normal limits  URINALYSIS, ROUTINE W REFLEX MICROSCOPIC - Abnormal; Notable for the following components:   Specific Gravity, Urine >1.030 (*)    Hgb urine dipstick SMALL (*)    Ketones, ur 40 (*)    Leukocytes,Ua TRACE (*)    All other components within normal limits  URINALYSIS, MICROSCOPIC (REFLEX) - Abnormal; Notable for the following components:   Bacteria, UA RARE (*)    All other components within normal limits  RESP PANEL BY RT-PCR (FLU A&B, COVID) ARPGX2  BRAIN NATRIURETIC PEPTIDE  MAGNESIUM  PROTIME-INR  TSH  POC OCCULT BLOOD, ED  TROPONIN I (HIGH SENSITIVITY)  TROPONIN I (HIGH SENSITIVITY)    EKG EKG Interpretation  Date/Time:  Tuesday May 30 2021 17:36:31 EST Ventricular Rate:  62 PR Interval:  162 QRS Duration: 114 QT Interval:  415 QTC Calculation: 422 R Axis:   -51 Text Interpretation: Sinus rhythm Abnormal R-wave progression, late transition Left ventricular hypertrophy Spoke to cardiology who feels this is not a STEMI No STEMI Confirmed by Antony Blackbird [phone]) on 05/30/2021 5:59:28 PM  Radiology DG Chest Portable 1 View  Result Date: 05/30/2021 CLINICAL DATA:  Exertional dyspnea, fatigue, right upper chest pain EXAM: PORTABLE CHEST 1 VIEW COMPARISON:  10/07/2020 FINDINGS: The heart size and mediastinal contours are within normal limits. Both lungs are clear. The visualized skeletal structures are unremarkable. IMPRESSION:  No active disease. Electronically Signed   By: Randa Ngo M.D.   On: 05/30/2021 18:12     Procedures Procedures   Medications Ordered in ED Medications - No data to display  ED Course  I have reviewed the triage vital signs and the nursing notes.  Pertinent labs & imaging results that were available during my care of the patient were reviewed by me and considered in my medical decision making (see chart for details).    MDM Rules/Calculators/A&P                           [name] is a 80 y.o. female with a past medical history significant for hypertension, hyperlipidemia, hypothyroidism, diabetes, vertigo, and previous anemia who presents with a syncopal episode this morning as well as "a while" of exertional shortness of breath, fatigue, lightheadedness, and edema.  Patient reports that she is also had some discomfort in her right arm that is tingling and painful going to her right shoulder and right upper anterior chest.  She denies any trauma.  She denies fevers, chills congestion, cough, nausea, vomiting, diaphoresis, constipation, diarrhea, or urinary changes.  She does report that she has had blood in her underwear pad which she is unsure if it is from her urine or from her rectum.  She does say she had GI bleed in the past.  She says her last 2 weeks has had more fatigue and lightheadedness for the last week including more exertional shortness of breath.  She feels her legs are more edematous bilaterally for quite some time.  She denies any infectious symptoms otherwise.  On exam, lungs clear and chest is nontender.  Abdomen is nontender.  Normal bowel sounds.  Flanks and back nontender.  Good pulses in extremities.  No murmur on my exam.  Patient's vital signs show some hypertension but otherwise reassuring initially.  EKG in triage did show some evidence of ST elevations.  I called cardiology and spoke to the STEMI provider, Dr. Einar Gip, who reviewed the EKG and does not feel this is a STEMI.  He felt that if the patient's work-up is reassuring in the emergency  department, she he would call his office and have her get seen this week as an outpatient and he feels she would likely be stable for discharge home.  Due to the report of possible rectal versus urinary bleeding, we will do a fecal occult and urinalysis.  We will get screening labs including a BNP due to the edema.  We will get chest x-ray and cardiac labs as well.  Dr. Einar Gip did feel that a delta troponin would be important for her.  Anticipate reassessment after work-up to determine disposition.  Work-up returned overall reassuring.  Patient does have evidence of possible urinary tract infection.  With some leukocytes and bacteria.  She also appears dehydrated.  Given the lightheadedness and fatigue, patient would like to be treated for possible UTI.  We will send a culture and she will follow-up on these results.  Otherwise, her troponin was negative x2 and BNP normal.  Chest x-ray reassuring.  She has not had any hypotension or lightheaded episodes in the emergency department and otherwise appears well.  Had a shared decision-making conversation we feel she is safe for discharge to follow-up with the cardiology team who is going to call her to set up an appointment with Dr. Irven Shelling office.  She understood return precautions and follow-up instructions  and was discharged in good condition.    Final Clinical Impression(s) / ED Diagnoses Final diagnoses:  Syncope, unspecified syncope type  Urinary tract infection without hematuria, site unspecified    Rx / DC Orders ED Discharge Orders          Ordered    cephALEXin (KEFLEX) 500 MG capsule  2 times daily        05/30/21 2148            Clinical Impression: 1. Syncope, unspecified syncope type   2. Urinary tract infection without hematuria, site unspecified     Disposition: Discharge  Condition: Good  I have discussed the results, Dx and Tx plan with the pt(& family if present). He/she/they expressed understanding and agree(s)  with the plan. Discharge instructions discussed at great length. Strict return precautions discussed and pt &/or family have verbalized understanding of the instructions. No further questions at time of discharge.    New Prescriptions   CEPHALEXIN (KEFLEX) 500 MG CAPSULE    Take 1 capsule (500 mg total) by mouth 2 (two) times daily for 7 days.    Follow Up: Glendale Chard, MD [address] [place] [address] [phone]     Adrian Prows, [place] [address] [phone]          Orvis Stann, Gwenyth Allegra, MD 05/30/21 2149

## 2021-05-30 NOTE — Progress Notes (Signed)
I,Jameka J Llittleton,acting as a Education administrator for [employer], NP.,have documented all relevant documentation on the behalf of [employer], NP,as directed by  Bary Castilla, NP while in the presence of Bary Castilla, NP.  This visit occurred during the SARS-CoV-2 public health emergency.  Safety protocols were in place, including screening questions prior to the visit, additional usage of staff PPE, and extensive cleaning of exam room while observing appropriate contact time as indicated for disinfecting solutions.  Subjective:     Patient ID: [name] , female    DOB: 1941-04-06 , 80 y.o.   MRN: [id number]   Chief Complaint  Patient presents with   passed out    HPI  Patient stated she passed out this morning. She got up and she was going to the bathroom and she blanked out. She stated she is just sore all over. She did hit her head and her chin. She does not recall how long she was on the floor for. She still has that headache and dizziness. She did not go to the Emergency room. She is oriented x 3. No visible injury noted.     Past Medical History:  Diagnosis Date   Arthritis    RIGHT KNEE PAIN AND OA   Chest pain 05/03/2015   Diabetes mellitus without complication (HCC)    Elevated cholesterol    GERD (gastroesophageal reflux disease)    Hypertension    Hypothyroidism    IBS (irritable bowel syndrome)    Rotator cuff disorder    BOTH SHOULDERS - NO SURGERY - AND STATES LIMITATIONS IN ARM MOVEMENT   Shortness of breath 05/03/2015   Swelling    CHRONIC SWELLING RIGHT HAND AND BOTH FEET AND BOTH LEGS   Vertigo      Family History  Problem Relation Age of Onset   Diabetes Mother    Dementia Mother    Diabetes Father    Diabetes Sister    Breast cancer Sister    Kidney cancer Sister    Lung cancer Sister    Lung cancer Maternal Aunt    Pancreatic cancer Maternal Aunt    Cancer Other      Current Outpatient Medications:    Apoaequorin (PREVAGEN  PO), Take 1 tablet by mouth daily., Disp: , Rfl:    Aspirin (ECOTRIN PO), Take 81 mg by mouth daily. , Disp: , Rfl:    BENICAR 20 MG tablet, TAKE 1 TABLET BY MOUTH  DAILY, Disp: 90 tablet, Rfl: 3   bimatoprost (LUMIGAN) 0.01 % SOLN, Place 1 drop into both eyes at bedtime. , Disp: , Rfl:    Blood Glucose Monitoring Suppl (BLOOD GLUCOSE MONITOR SYSTEM) w/Device KIT, Use to check blood sugar 3 times a day. Dx code e11.65, Disp: 3 kit, Rfl: 3   CRESTOR 10 MG tablet, TAKE 1 TABLET BY MOUTH  DAILY, Disp: 90 tablet, Rfl: 3   cyclobenzaprine (FLEXERIL) 5 MG tablet, Take 1 tablet (5 mg total) by mouth 3 (three) times daily as needed for muscle spasms., Disp: 30 tablet, Rfl: 0   famotidine (PEPCID) 20 MG tablet, Take by mouth., Disp: , Rfl:    furosemide (LASIX) 20 MG tablet, TAKE 1 TABLET BY MOUTH IN  THE MORNING, Disp: 90 tablet, Rfl: 3   glucose blood (ONETOUCH VERIO) test strip, Use as instructed to check blood sugars 3 times daily E11.69, Disp: 100 each, Rfl: 12   Lancets (ONETOUCH DELICA PLUS [id number]) MISC, USE ONE LANCET 3 TIMES  DAILY BEFORE MEALS,  Disp: 300 each, Rfl: 3   Lifitegrast (XIIDRA) 5 % SOLN, Apply to eye. xiidra, Disp: , Rfl:    MAGNESIUM-OXIDE 400 (241.3 Mg) MG tablet, TAKE 1 TABLET BY MOUTH EVERY DAY AFTER SUPPER, Disp: 90 tablet, Rfl: 2   Multiple Vitamins-Minerals (DIABETES SUPPORT PO), Take 1 tablet by mouth daily., Disp: , Rfl:    polyethylene glycol (MIRALAX / GLYCOLAX) packet, Take 17 g by mouth daily., Disp: , Rfl:    SYNTHROID 75 MCG tablet, TAKE 1 TABLET BY MOUTH  DAILY BEFORE BREAKFAST (Patient taking differently: Monday - Friday), Disp: 90 tablet, Rfl: 3   vitamin B-6 (PYRIDOXINE) 25 MG tablet, Take 25 mg by mouth daily., Disp: , Rfl:    Allergies  Allergen Reactions   Codeine Nausea And Vomiting   Macrobid [Nitrofurantoin Macrocrystal]     RASH   Tramadol Nausea And Vomiting     Review of Systems  Constitutional:  Negative for chills and fever.  HENT:  Negative  for congestion.   Respiratory:  Negative for cough, shortness of breath and wheezing.   Cardiovascular:  Negative for chest pain and palpitations.  Gastrointestinal:  Negative for constipation and diarrhea.  Musculoskeletal:  Positive for arthralgias.       Sore all over her body due to her fall.   Neurological:  Positive for dizziness, weakness and headaches. Negative for numbness.    Today's Vitals   05/30/21 1547  BP: 122/72  Pulse: 81  Temp: 98.1 F (36.7 C)  Weight: 212 lb 12.8 oz (96.5 kg)  Height: $Remove'5\' 1"'MGjOZwc$  (1.549 m)   Body mass index is 40.21 kg/m.  Wt Readings from Last 3 Encounters:  05/30/21 212 lb 12.8 oz (96.5 kg)  03/29/21 212 lb 12.8 oz (96.5 kg)  02/23/21 212 lb 12.8 oz (96.5 kg)    Objective:  Physical Exam Constitutional:      Appearance: Normal appearance. She is obese.  HENT:     Head: Normocephalic and atraumatic.  Cardiovascular:     Rate and Rhythm: Normal rate and regular rhythm.     Pulses: Normal pulses.     Heart sounds: Normal heart sounds. No murmur heard. Pulmonary:     Effort: Pulmonary effort is normal. No respiratory distress.     Breath sounds: Normal breath sounds. No wheezing.  Musculoskeletal:        General: Tenderness present.     Comments: Tenderness to both knees.   Skin:    General: Skin is warm and dry.     Capillary Refill: Capillary refill takes less than 2 seconds.  Neurological:     Mental Status: She is alert and oriented to person, place, and time.        Assessment And Plan:     1. Fall, initial encounter She is oriented x 3. She had a fall this morning when getting up. She does not recall how long she was on the floor. She does remember fainting. She did hit her head and chin on the vanity and she is sore all over. Her Bp currently is 122/72. She continues to feel dizzy and has a headache. I have advised the patient because she is still having a headache and she still feels lightheaded and dizzy, to go to the emergency  room where she can get immediate care including lab work and CT of her head. Patient verbalizes understanding and agrees to the plan. The patient's daughter will take her to the emergency room for further evaluation.   2.  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 Increase the intake of lean meats including grilled chicken and [place].  Drink a lot of water. Decrease intake of fatty foods. Exercise for 30-45 min. 4-5 a week to decrease the risk of cardiac event.   Follow up: if symptoms persist or do not get better.   The patient was encouraged to call or send a message through [place] for any questions or concerns.   Patient was given opportunity to ask questions. Patient verbalized understanding of the plan and was able to repeat key elements of the plan. All questions were answered to their satisfaction.  Raman Eugina Row, DNP   I, Raman Francois Elk have reviewed all documentation for this visit. The documentation on 11/17/20 for the exam, diagnosis, procedures, and orders are all accurate and complete.      Patient was given opportunity to ask questions. Patient verbalized understanding of the plan and was able to repeat key elements of the plan. All questions were answered to their satisfaction.  Sheppard Evens Llittleton, CMA   I, [place], CMA, have reviewed all documentation for this visit. The documentation on 05/30/21 for the exam, diagnosis, procedures, and orders are all accurate and complete.   IF YOU HAVE BEEN REFERRED TO A SPECIALIST, IT MAY TAKE 1-2 WEEKS TO SCHEDULE/PROCESS THE REFERRAL. IF YOU HAVE NOT HEARD FROM US/SPECIALIST IN TWO WEEKS, PLEASE GIVE [place] A CALL AT [phone].   THE PATIENT IS ENCOURAGED TO PRACTICE SOCIAL DISTANCING DUE TO THE COVID-19 PANDEMIC.

## 2021-05-31 ENCOUNTER — Telehealth: Payer: Self-pay

## 2021-05-31 ENCOUNTER — Encounter: Payer: Self-pay | Admitting: Cardiology

## 2021-05-31 LAB — TSH: TSH: 0.993 u[IU]/mL (ref 0.350–4.500)

## 2021-05-31 NOTE — Telephone Encounter (Signed)
Did you tell her that I will see her with [name] and put a note so I can see the patient with her

## 2021-05-31 NOTE — Telephone Encounter (Signed)
Left pt vm to give the office a call back to schedule ED f/u. Given ext.[phone].

## 2021-06-02 ENCOUNTER — Other Ambulatory Visit: Payer: Self-pay | Admitting: Orthopedic Surgery

## 2021-06-06 ENCOUNTER — Ambulatory Visit (INDEPENDENT_AMBULATORY_CARE_PROVIDER_SITE_OTHER): Payer: Medicare Other | Admitting: Nurse Practitioner

## 2021-06-06 ENCOUNTER — Other Ambulatory Visit: Payer: Self-pay

## 2021-06-06 ENCOUNTER — Encounter: Payer: Self-pay | Admitting: Nurse Practitioner

## 2021-06-06 VITALS — BP 130/70 | HR 80 | Temp 97.5°F | Ht 61.6 in | Wt 217.6 lb

## 2021-06-06 DIAGNOSIS — R55 Syncope and collapse: Secondary | ICD-10-CM | POA: Diagnosis not present

## 2021-06-06 DIAGNOSIS — Z6841 Body Mass Index (BMI) 40.0 and over, adult: Secondary | ICD-10-CM | POA: Diagnosis not present

## 2021-06-06 NOTE — Progress Notes (Signed)
I,Victoria T Hamilton,acting as a Education administrator for [employer], NP.,have documented all relevant documentation on the behalf of [employer], NP,as directed by  Bary Castilla, NP while in the presence of Bary Castilla, NP.  This visit occurred during the SARS-CoV-2 public health emergency.  Safety protocols were in place, including screening questions prior to the visit, additional usage of staff PPE, and extensive cleaning of exam room while observing appropriate contact time as indicated for disinfecting solutions.  Subjective:     Patient ID: [name] , female    DOB: 1941-07-08 , 80 y.o.   MRN: [id number]   Chief Complaint  Patient presents with   ER F/U    HPI   She was seen in the ED on the 11/22 after she came here for a fall. I advised her to go to the emergency room that day. . And she was treated for syncope, dehydration and UTI. She is drinking a lot of water. There was also some abnormality with her cardiac workup so she was advised to make an appt with cardiology. She requests to be seen by Dr. Einar Gip. She will make an appt with them today.      Past Medical History:  Diagnosis Date   Arthritis    RIGHT KNEE PAIN AND OA   Chest pain 05/03/2015   Diabetes mellitus without complication (HCC)    Elevated cholesterol    GERD (gastroesophageal reflux disease)    Hypertension    Hypothyroidism    IBS (irritable bowel syndrome)    Rotator cuff disorder    BOTH SHOULDERS - NO SURGERY - AND STATES LIMITATIONS IN ARM MOVEMENT   Shortness of breath 05/03/2015   Swelling    CHRONIC SWELLING RIGHT HAND AND BOTH FEET AND BOTH LEGS   Vertigo      Family History  Problem Relation Age of Onset   Diabetes Mother    Dementia Mother    Diabetes Father    Diabetes Sister    Breast cancer Sister    Kidney cancer Sister    Lung cancer Sister    Lung cancer Maternal Aunt    Pancreatic cancer Maternal Aunt    Cancer Other      Current Outpatient Medications:     Apoaequorin (PREVAGEN PO), Take 1 tablet by mouth daily., Disp: , Rfl:    Aspirin (ECOTRIN PO), Take 81 mg by mouth daily. , Disp: , Rfl:    BENICAR 20 MG tablet, TAKE 1 TABLET BY MOUTH  DAILY, Disp: 90 tablet, Rfl: 3   bimatoprost (LUMIGAN) 0.01 % SOLN, Place 1 drop into both eyes at bedtime. , Disp: , Rfl:    Blood Glucose Monitoring Suppl (BLOOD GLUCOSE MONITOR SYSTEM) w/Device KIT, Use to check blood sugar 3 times a day. Dx code e11.65, Disp: 3 kit, Rfl: 3   cephALEXin (KEFLEX) 500 MG capsule, Take 1 capsule (500 mg total) by mouth 2 (two) times daily for 7 days., Disp: 14 capsule, Rfl: 0   CRESTOR 10 MG tablet, TAKE 1 TABLET BY MOUTH  DAILY, Disp: 90 tablet, Rfl: 3   cyclobenzaprine (FLEXERIL) 5 MG tablet, Take 1 tablet (5 mg total) by mouth 3 (three) times daily as needed for muscle spasms., Disp: 30 tablet, Rfl: 0   famotidine (PEPCID) 20 MG tablet, Take by mouth., Disp: , Rfl:    furosemide (LASIX) 20 MG tablet, TAKE 1 TABLET BY MOUTH IN  THE MORNING, Disp: 90 tablet, Rfl: 3   glucose blood (ONETOUCH VERIO)  test strip, Use as instructed to check blood sugars 3 times daily E11.69, Disp: 100 each, Rfl: 12   Lancets (ONETOUCH DELICA PLUS [id number]) MISC, USE ONE LANCET 3 TIMES  DAILY BEFORE MEALS, Disp: 300 each, Rfl: 3   Lifitegrast (XIIDRA) 5 % SOLN, Apply to eye. xiidra, Disp: , Rfl:    MAGNESIUM-OXIDE 400 (241.3 Mg) MG tablet, TAKE 1 TABLET BY MOUTH EVERY DAY AFTER SUPPER, Disp: 90 tablet, Rfl: 2   Multiple Vitamins-Minerals (DIABETES SUPPORT PO), Take 1 tablet by mouth daily., Disp: , Rfl:    polyethylene glycol (MIRALAX / GLYCOLAX) packet, Take 17 g by mouth daily., Disp: , Rfl:    SYNTHROID 75 MCG tablet, TAKE 1 TABLET BY MOUTH  DAILY BEFORE BREAKFAST (Patient taking differently: Monday - Friday), Disp: 90 tablet, Rfl: 3   vitamin B-6 (PYRIDOXINE) 25 MG tablet, Take 25 mg by mouth daily., Disp: , Rfl:    Allergies  Allergen Reactions   Codeine Nausea And Vomiting   Macrobid  [Nitrofurantoin Macrocrystal]     RASH   Tramadol Nausea And Vomiting     Review of Systems  Constitutional:  Negative for chills and fever.  HENT:  Negative for congestion.   Respiratory:  Negative for cough, shortness of breath and wheezing.   Cardiovascular:  Negative for chest pain and palpitations.  Neurological:  Negative for headaches.    Today's Vitals   06/06/21 1415  BP: 130/70  Pulse: 80  Temp: (!) 97.5 F (36.4 C)  Weight: 217 lb 9.6 oz (98.7 kg)  Height: 5' 1.6" (1.565 m)  PainSc: 0-No pain   Body mass index is 40.32 kg/m.  Wt Readings from Last 3 Encounters:  06/06/21 217 lb 9.6 oz (98.7 kg)  05/30/21 212 lb (96.2 kg)  05/30/21 212 lb 12.8 oz (96.5 kg)     Objective:  Physical Exam Constitutional:      Appearance: Normal appearance.  HENT:     Head: Normocephalic and atraumatic.  Cardiovascular:     Rate and Rhythm: Normal rate and regular rhythm.     Pulses: Normal pulses.     Heart sounds: Normal heart sounds. No murmur heard. Pulmonary:     Effort: Pulmonary effort is normal. No respiratory distress.     Breath sounds: Normal breath sounds. No wheezing.  Skin:    General: Skin is warm and dry.     Capillary Refill: Capillary refill takes less than 2 seconds.  Neurological:     Mental Status: She is alert and oriented to person, place, and time.        Assessment And Plan:     1. Syncope, unspecified syncope type -She was seen on 11/22 in the ED for syncope episode she had abnormal EKG; with R-wave progression with left ventricular hypertrophy. She was advised to follow up with Dr. Einar Gip (cardiologist). She is advised to call his office today to make an appt.  -She was also treated for UTI with cephalexin 500 mg twice daily  -She was also treated for dehydration for which is instructed to keep herself hydrated with a lot of water.   2. Class 3 severe obesity due to excess calories with body mass index (BMI) of 40.0 to 44.9 in adult,  unspecified whether serious comorbidity present ([place])  -Advised patient on a healthy diet including avoiding fast food and red meats. Increase the intake of lean meats including grilled chicken and [place].  Drink a lot of water. Decrease intake of fatty foods. Exercise for  30-45 min. 4-5 a week to decrease the risk of cardiac event.   The patient was encouraged to call or send a message through [place] for any questions or concerns.   Follow up: if symptoms persist or do not get better.   Patient was given opportunity to ask questions. Patient verbalized understanding of the plan and was able to repeat key elements of the plan. All questions were answered to their satisfaction.  Raman Khali Albanese, DNP   I, Raman Brighid Koch have reviewed all documentation for this visit. The documentation on 06/06/21 for the exam, diagnosis, procedures, and orders are all accurate and complete.    IF YOU HAVE BEEN REFERRED TO A SPECIALIST, IT MAY TAKE 1-2 WEEKS TO SCHEDULE/PROCESS THE REFERRAL. IF YOU HAVE NOT HEARD FROM US/SPECIALIST IN TWO WEEKS, PLEASE GIVE [place] A CALL AT [phone].   THE PATIENT IS ENCOURAGED TO PRACTICE SOCIAL DISTANCING DUE TO THE COVID-19 PANDEMIC.

## 2021-06-08 ENCOUNTER — Ambulatory Visit: Payer: Medicare Other | Admitting: Student

## 2021-06-08 ENCOUNTER — Inpatient Hospital Stay: Payer: Medicare Other

## 2021-06-08 ENCOUNTER — Encounter: Payer: Self-pay | Admitting: Student

## 2021-06-08 ENCOUNTER — Other Ambulatory Visit: Payer: Self-pay

## 2021-06-08 VITALS — Temp 98.5°F | Resp 97 | Ht 61.6 in | Wt 214.2 lb

## 2021-06-08 DIAGNOSIS — I10 Essential (primary) hypertension: Secondary | ICD-10-CM

## 2021-06-08 DIAGNOSIS — E118 Type 2 diabetes mellitus with unspecified complications: Secondary | ICD-10-CM

## 2021-06-08 DIAGNOSIS — E7800 Pure hypercholesterolemia, unspecified: Secondary | ICD-10-CM

## 2021-06-08 DIAGNOSIS — R55 Syncope and collapse: Secondary | ICD-10-CM

## 2021-06-08 DIAGNOSIS — R0609 Other forms of dyspnea: Secondary | ICD-10-CM

## 2021-06-08 NOTE — Progress Notes (Signed)
Primary Physician/Referring:  Glendale Chard, MD  Patient ID: [name], female    DOB: 1941-03-07, 80 y.o.   MRN: [id number]  Chief Complaint  Patient presents with   Loss of Consciousness   Follow-up   HPI:    [name]  is a 34 y.o. AA female with history of hypertension, hyperlipidemia, diabetes mellitus, hypothyroidism, vertigo.  She presented to emergency department 05/30/2021 following an episode of syncope.  Denies history of MI, TIA/CVA, sleep apnea, family history of premature CAD.  Patient reports she was in her home getting ready on the morning of 05/2021.  She states she was standing in her bathroom she suddenly lost consciousness.  Patient states she woke up on the floor without recollection of the event.  The syncopal episode was unwitnessed.  Patient denies prodromal symptoms.  Denies postictal state or loss of bowel or bladder control.  Patient is relatively active, particularly for her age, doing water aerobics 4 days/week without issue.  Patient does continue to have episodes of lightheadedness, particularly with positional changes.  Past Medical History:  Diagnosis Date   Arthritis    RIGHT KNEE PAIN AND OA   Chest pain 05/03/2015   Diabetes mellitus without complication (HCC)    Elevated cholesterol    GERD (gastroesophageal reflux disease)    Hypertension    Hypothyroidism    IBS (irritable bowel syndrome)    Rotator cuff disorder    BOTH SHOULDERS - NO SURGERY - AND STATES LIMITATIONS IN ARM MOVEMENT   Shortness of breath 05/03/2015   Swelling    CHRONIC SWELLING RIGHT HAND AND BOTH FEET AND BOTH LEGS   Vertigo    Past Surgical History:  Procedure Laterality Date   ABDOMINAL HYSTERECTOMY  1985   BREAST SURGERY  1985   RIGHT BREAST BIOPSY - BENIGN   CARPAL TUNNEL RELEASE Left 02/05/2019   Procedure: LEFT CARPAL TUNNEL RELEASE;  Surgeon: Daryll Brod, MD;  Location: [facility];  Service: Orthopedics;  Laterality: Left;    [place]   BILATERAL CATARACT EXTRACTIONS; SURGERY FOR MACULAR HOLE    HEMORRHOID SURGERY  1975   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   Procedure: RIGHT TOTAL KNEE ARTHROPLASTY;  Surgeon: Gearlean Alf, MD;  Location: [facility];  Service: Orthopedics;  Laterality: Right;   Family History  Problem Relation Age of Onset   Diabetes Mother    Dementia Mother    Diabetes Father    Diabetes Sister    Breast cancer Sister    Kidney cancer Sister    Lung cancer Sister    Lung cancer Maternal Aunt    Pancreatic cancer Maternal Aunt    Cancer Other     Social History   Tobacco Use   Smoking status: Never   Smokeless tobacco: Never  Substance Use Topics   Alcohol use: Not Currently    Comment: OCCAS ALCOHOL   Marital Status: Widowed   ROS  Review of Systems  Constitutional: Negative for malaise/fatigue and weight gain.  Cardiovascular:  Positive for syncope. Negative for chest pain, claudication, leg swelling, near-syncope, orthopnea, palpitations and paroxysmal nocturnal dyspnea.  Respiratory:  Negative for shortness of breath.   Neurological:  Positive for light-headedness.   Objective  Temperature 98.5 F (36.9 C), temperature source Temporal, resp. rate (!) 97, height 5' 1.6" (1.565 m), weight 214 lb 3.2 oz (97.2 kg), SpO2 98 %.  Vitals with BMI 06/08/2021 06/06/2021 05/30/2021  Height 5' 1.6" 5' 1.6" -  Weight 214 lbs 3 oz 217 lbs 10 oz -  BMI 95.63 87.5 -  Systolic - 643 329  Diastolic - 70 61  Pulse - 80 73    Orthostatic VS for the past 72 hrs (Last 3 readings):  Orthostatic BP Patient Position BP Location Cuff Size Orthostatic Pulse  06/08/21 1048 154/79 Standing Left Arm Large 74  06/08/21 1047 144/69 Sitting Left Arm Large 71  06/08/21 1038 149/73 Supine Left Arm Large 69     Physical Exam Vitals reviewed.  Cardiovascular:     Rate and Rhythm: Normal rate and regular rhythm.     Pulses: Intact distal pulses.           Carotid pulses are 2+ on the right side and 2+ on the left side.      Radial pulses are 2+ on the right side and 2+ on the left side.       Popliteal pulses are 2+ on the right side and 2+ on the left side.       Dorsalis pedis pulses are 0 on the right side and 0 on the left side.       Posterior tibial pulses are 0 on the right side and 0 on the left side.     Heart sounds: S1 normal and S2 normal. No murmur heard.   No gallop.  Pulmonary:     Effort: Pulmonary effort is normal. No respiratory distress.     Breath sounds: No wheezing, rhonchi or rales.  Musculoskeletal:     Right lower leg: No edema.     Left lower leg: No edema.  Skin:    Capillary Refill: Capillary refill takes less than 2 seconds.  Neurological:     Mental Status: She is alert.    Laboratory examination:   Recent Labs    07/13/20 1550 07/13/20 1550 10/07/20 1658 10/13/20 1243 01/02/21 1243 02/23/21 1040 05/30/21 1809  NA 143   < > 137   < > 139 142 137  K 4.7  --  4.0   < > 3.9 4.4 3.9  CL 104  --  105   < > 105 108* 105  CO2 26  --  27   < > $R'26 23 23  'YZ$ GLUCOSE 103*   < > 114*   < > 126* 116* 104*  BUN 15   < > 17   < > $R'14 19 17  'jy$ CREATININE 0.71  --  0.58   < > 0.67 0.73 0.68  CALCIUM 11.1*  --  10.3   < > 10.2 10.7* 10.4*  GFRNONAA 80  --  >60  --  >60  --  >60  GFRAA 92  --   --   --   --   --   --    < > = values in this interval not displayed.   estimated creatinine clearance is 58.5 mL/min (by C-G formula based on SCr of 0.68 mg/dL).  CMP Latest Ref Rng & Units 05/30/2021 02/23/2021 01/02/2021  Glucose 70 - 99 mg/dL 104(H) 116(H) 126(H)  BUN 8 - 23 mg/dL $Remove'17 19 14  'UymtwRd$ Creatinine 0.44 - 1.00 mg/dL 0.68 0.73 0.67  Sodium 135 - 145 mmol/L 137 142 139  Potassium 3.5 - 5.1 mmol/L 3.9 4.4 3.9  Chloride 98 - 111 mmol/L 105 108(H) 105  CO2 22 - 32 mmol/L $RemoveB'23 23 26  'NFyDbidL$ Calcium 8.9 - 10.3 mg/dL 10.4(H) 10.7(H) 10.2  Total  Protein 6.5 - 8.1 g/dL 7.0 6.6 6.7  Total Bilirubin 0.3 - 1.2 mg/dL 0.7 0.6 0.7   Alkaline Phos 38 - 126 U/L 109 117 95  AST 15 - 41 U/L $Remo'25 22 24  'PXsyq$ ALT 0 - 44 U/L 32 33(H) 30   CBC Latest Ref Rng & Units 05/30/2021 01/02/2021 10/07/2020  WBC 4.0 - 10.5 K/uL 5.3 4.5 5.7  Hemoglobin 12.0 - 15.0 g/dL 16.3(H) 15.6(H) 14.6  Hematocrit 36.0 - 46.0 % 48.2(H) 45.5 43.3  Platelets 150 - 400 K/uL 186 178 162    Lipid Panel No results for input(s): CHOL, TRIG, LDLCALC, VLDL, HDL, CHOLHDL, LDLDIRECT in the last 8760 hours.  HEMOGLOBIN A1C Lab Results  Component Value Date   HGBA1C 6.7 (H) 02/23/2021   TSH Recent Labs    07/13/20 1550 10/13/20 1243 05/30/21 1824  TSH 0.865 1.030 0.993    External labs:   None  Allergies   Allergies  Allergen Reactions   Codeine Nausea And Vomiting   Tramadol Nausea And Vomiting   Macrobid [Nitrofurantoin Macrocrystal] Rash    Medications Prior to Visit:   Outpatient Medications Prior to Visit  Medication Sig Dispense Refill   Aspirin (ECOTRIN PO) Take 81 mg by mouth daily.      BENICAR 20 MG tablet TAKE 1 TABLET BY MOUTH  DAILY 90 tablet 3   CRESTOR 10 MG tablet TAKE 1 TABLET BY MOUTH  DAILY 90 tablet 3   famotidine (PEPCID) 20 MG tablet Take by mouth.     furosemide (LASIX) 20 MG tablet TAKE 1 TABLET BY MOUTH IN  THE MORNING 90 tablet 3   glucose blood (ONETOUCH VERIO) test strip Use as instructed to check blood sugars 3 times daily E11.69 100 each 12   Lancets (ONETOUCH DELICA PLUS [id number]) MISC USE ONE LANCET 3 TIMES  DAILY BEFORE MEALS 300 each 3   Lifitegrast (XIIDRA) 5 % SOLN Apply to eye. xiidra     MAGNESIUM-OXIDE 400 (241.3 Mg) MG tablet TAKE 1 TABLET BY MOUTH EVERY DAY AFTER SUPPER 90 tablet 2   Multiple Vitamins-Minerals (DIABETES SUPPORT PO) Take 1 tablet by mouth daily.     polyethylene glycol (MIRALAX / GLYCOLAX) packet Take 17 g by mouth daily.     SYNTHROID 75 MCG tablet TAKE 1 TABLET BY MOUTH  DAILY BEFORE BREAKFAST (Patient taking differently: Monday - Friday) 90 tablet 3   vitamin B-6 (PYRIDOXINE) 25  MG tablet Take 25 mg by mouth daily.     Apoaequorin (PREVAGEN PO) Take 1 tablet by mouth daily.     bimatoprost (LUMIGAN) 0.01 % SOLN Place 1 drop into both eyes at bedtime.      Blood Glucose Monitoring Suppl (BLOOD GLUCOSE MONITOR SYSTEM) w/Device KIT Use to check blood sugar 3 times a day. Dx code e11.65 3 kit 3   tiZANidine (ZANAFLEX) 4 MG tablet      cyclobenzaprine (FLEXERIL) 5 MG tablet Take 1 tablet (5 mg total) by mouth 3 (three) times daily as needed for muscle spasms. 30 tablet 0   No facility-administered medications prior to visit.   Final Medications at End of Visit    Current Meds  Medication Sig   Aspirin (ECOTRIN PO) Take 81 mg by mouth daily.    BENICAR 20 MG tablet TAKE 1 TABLET BY MOUTH  DAILY   CRESTOR 10 MG tablet TAKE 1 TABLET BY MOUTH  DAILY   famotidine (PEPCID) 20 MG tablet Take by mouth.   furosemide (LASIX) 20 MG tablet  TAKE 1 TABLET BY MOUTH IN  THE MORNING   glucose blood (ONETOUCH VERIO) test strip Use as instructed to check blood sugars 3 times daily E11.69   Lancets (ONETOUCH DELICA PLUS ALPFXT02I) MISC USE ONE LANCET 3 TIMES  DAILY BEFORE MEALS   Lifitegrast (XIIDRA) 5 % SOLN Apply to eye. xiidra   MAGNESIUM-OXIDE 400 (241.3 Mg) MG tablet TAKE 1 TABLET BY MOUTH EVERY DAY AFTER SUPPER   Multiple Vitamins-Minerals (DIABETES SUPPORT PO) Take 1 tablet by mouth daily.   polyethylene glycol (MIRALAX / GLYCOLAX) packet Take 17 g by mouth daily.   SYNTHROID 75 MCG tablet TAKE 1 TABLET BY MOUTH  DAILY BEFORE BREAKFAST (Patient taking differently: Monday - Friday)   vitamin B-6 (PYRIDOXINE) 25 MG tablet Take 25 mg by mouth daily.   Radiology:   No results found.  Cardiac Studies:   None   EKG:   05/28/2021: Sinus rhythm at 62 bpm.  Left axis.  LVH per voltage criteria with secondary ST-T changes.   Assessment     ICD-10-CM   1. Dyspnea on exertion  R06.09 PCV MYOCARDIAL PERFUSION WITH LEXISCAN    PCV ECHOCARDIOGRAM COMPLETE    PCV ANKLE BRACHIAL  INDEX (ABI)    2. Syncope and collapse  R55 PCV MYOCARDIAL PERFUSION WITH LEXISCAN    PCV ECHOCARDIOGRAM COMPLETE    PCV ANKLE BRACHIAL INDEX (ABI)    LONG TERM MONITOR (3-14 DAYS)    3. Essential hypertension  I10     4. Hypercholesterolemia  E78.00     5. Type 2 diabetes mellitus with complication, without long-term current use of insulin (HCC)  E11.8        Medications Discontinued During This Encounter  Medication Reason   cyclobenzaprine (FLEXERIL) 5 MG tablet     No orders of the defined types were placed in this encounter.   Recommendations:   [name] is a 5 y.o. AA female with history of hypertension, hyperlipidemia, diabetes mellitus, hypothyroidism, vertigo.  She presented to emergency department 05/30/2021 following an episode of syncope.  Denies history of MI, TIA/CVA, sleep apnea, family history of premature CAD.  Presents to our office for evaluation of syncope.  Patient's syncopal episode is concerning for cardiac etiology given lack of prodrome of symptoms.  Given multiple cardiovascular risk factors and recent symptoms recommend further evaluation with echocardiogram, stress test, and cardiac monitor.  We will proceed with nuclear stress test, however patient is not a candidate for treadmill given Lightheadedness and history of syncope. Will obtain 2-week cardiac monitor to evaluate for underlying arrhythmias, however if it is negative would consider loop recorder implantation.   On exam patient's pedal pulses are reduced, will therefore obtain ABI.  Patient's blood pressure is mildly elevated in the office today, she is not orthostatic.  Patient does report well-controlled home blood pressure readings, therefore will not make changes to medications at this time.  However advised her to bring written log of home blood pressure readings to next office visit. Could also consider lipid profile testing in the future as recent testing is not available for review.    Further recommendations pending results of cardiac testing.   Patient was seen in collaboration with Dr. Einar Gip. He also reviewed patient's chart and examined the patient. Dr. Einar Gip is in agreement of the plan. I personally reviewed external records from recent ED visit.   During this visit I reviewed and updated: Tobacco history  allergies medication reconciliation  medical history  surgical history  family  history  social history.    Alethia Berthold, PA-C 06/08/2021, 1:26 PM Office: [phone]

## 2021-06-13 ENCOUNTER — Encounter: Payer: Self-pay | Admitting: Student

## 2021-06-15 ENCOUNTER — Ambulatory Visit: Payer: Medicare Other | Admitting: Internal Medicine

## 2021-06-15 LAB — HM DIABETES EYE EXAM

## 2021-06-19 ENCOUNTER — Other Ambulatory Visit: Payer: Medicare Other

## 2021-06-20 ENCOUNTER — Other Ambulatory Visit: Payer: Self-pay

## 2021-06-20 ENCOUNTER — Ambulatory Visit: Payer: Medicare Other

## 2021-06-20 DIAGNOSIS — R55 Syncope and collapse: Secondary | ICD-10-CM

## 2021-06-20 DIAGNOSIS — R0609 Other forms of dyspnea: Secondary | ICD-10-CM

## 2021-06-20 DIAGNOSIS — R0989 Other specified symptoms and signs involving the circulatory and respiratory systems: Secondary | ICD-10-CM

## 2021-06-21 ENCOUNTER — Ambulatory Visit: Payer: Medicare Other

## 2021-06-21 DIAGNOSIS — R0609 Other forms of dyspnea: Secondary | ICD-10-CM

## 2021-06-21 DIAGNOSIS — R55 Syncope and collapse: Secondary | ICD-10-CM

## 2021-06-22 ENCOUNTER — Encounter: Payer: Self-pay | Admitting: Internal Medicine

## 2021-06-22 ENCOUNTER — Ambulatory Visit (INDEPENDENT_AMBULATORY_CARE_PROVIDER_SITE_OTHER): Payer: Medicare Other | Admitting: Internal Medicine

## 2021-06-22 ENCOUNTER — Other Ambulatory Visit: Payer: Self-pay

## 2021-06-22 VITALS — BP 134/70 | HR 81 | Ht 61.6 in | Wt 214.6 lb

## 2021-06-22 DIAGNOSIS — R55 Syncope and collapse: Secondary | ICD-10-CM

## 2021-06-22 DIAGNOSIS — E039 Hypothyroidism, unspecified: Secondary | ICD-10-CM | POA: Diagnosis not present

## 2021-06-22 DIAGNOSIS — I10 Essential (primary) hypertension: Secondary | ICD-10-CM

## 2021-06-22 DIAGNOSIS — E1165 Type 2 diabetes mellitus with hyperglycemia: Secondary | ICD-10-CM

## 2021-06-22 DIAGNOSIS — Z6839 Body mass index (BMI) 39.0-39.9, adult: Secondary | ICD-10-CM

## 2021-06-22 NOTE — Progress Notes (Signed)
I,[name],acting as a Education administrator for [name], MD.,have documented all relevant documentation on the behalf of [name], MD,as directed by  [name], MD while in the presence of [name], MD.  This visit occurred during the SARS-CoV-2 public health emergency.  Safety protocols were in place, including screening questions prior to the visit, additional usage of staff PPE, and extensive cleaning of exam room while observing appropriate contact time as indicated for disinfecting solutions.  Subjective:     Patient ID: [name] , female    DOB: Sep 09, 1940 , 80 y.o.   MRN: [id number]   Chief Complaint  Patient presents with   Diabetes   Hypertension    HPI  She presents today for BP and DM f/u. She reports compliance with meds. Denies headaches, chest pain and palpitations. She has no new concerns at this time.   Diabetes She presents for her follow-up diabetic visit. She has type 2 diabetes mellitus. Her disease course has been stable. There are no hypoglycemic associated symptoms. There are no diabetic associated symptoms. Pertinent negatives for diabetes include no blurred vision and no chest pain. There are no hypoglycemic complications. Risk factors for coronary artery disease include diabetes mellitus, dyslipidemia, hypertension, obesity, post-menopausal and sedentary lifestyle. She participates in exercise intermittently. Eye exam is current.  Hypertension This is a chronic problem. The current episode started more than 1 year ago. The problem has been gradually improving since onset. The problem is controlled. Pertinent negatives include no blurred vision, chest pain, palpitations or shortness of breath. Agents associated with hypertension include thyroid hormones. Risk factors for coronary artery disease include dyslipidemia, obesity, post-menopausal state and sedentary lifestyle. Past treatments include angiotensin blockers. The current treatment  provides moderate improvement. Compliance problems include exercise.     Past Medical History:  Diagnosis Date   Arthritis    RIGHT KNEE PAIN AND OA   Chest pain 05/03/2015   Diabetes mellitus without complication (HCC)    Elevated cholesterol    GERD (gastroesophageal reflux disease)    Hypertension    Hypothyroidism    IBS (irritable bowel syndrome)    Rotator cuff disorder    BOTH SHOULDERS - NO SURGERY - AND STATES LIMITATIONS IN ARM MOVEMENT   Shortness of breath 05/03/2015   Swelling    CHRONIC SWELLING RIGHT HAND AND BOTH FEET AND BOTH LEGS   Vertigo      Family History  Problem Relation Age of Onset   Diabetes Mother    Dementia Mother    Diabetes Father    Diabetes Sister    Breast cancer Sister    Kidney cancer Sister    Lung cancer Sister    Lung cancer Maternal Aunt    Pancreatic cancer Maternal Aunt    Cancer Other      Current Outpatient Medications:    Aspirin (ECOTRIN PO), Take 81 mg by mouth daily. , Disp: , Rfl:    BENICAR 20 MG tablet, TAKE 1 TABLET BY MOUTH  DAILY, Disp: 90 tablet, Rfl: 3   bimatoprost (LUMIGAN) 0.01 % SOLN, Place 1 drop into both eyes at bedtime. , Disp: , Rfl:    Blood Glucose Monitoring Suppl (BLOOD GLUCOSE MONITOR SYSTEM) w/Device KIT, Use to check blood sugar 3 times a day. Dx code e11.65, Disp: 3 kit, Rfl: 3   CRESTOR 10 MG tablet, TAKE 1 TABLET BY MOUTH  DAILY, Disp: 90 tablet, Rfl: 3   famotidine (PEPCID) 20 MG tablet, Take  by mouth., Disp: , Rfl:    furosemide (LASIX) 20 MG tablet, TAKE 1 TABLET BY MOUTH IN  THE MORNING, Disp: 90 tablet, Rfl: 3   glucose blood (ONETOUCH VERIO) test strip, Use as instructed to check blood sugars 3 times daily E11.69, Disp: 100 each, Rfl: 12   Lancets (ONETOUCH DELICA PLUS [id number]) MISC, USE ONE LANCET 3 TIMES  DAILY BEFORE MEALS, Disp: 300 each, Rfl: 3   Lifitegrast (XIIDRA) 5 % SOLN, Apply to eye. xiidra, Disp: , Rfl:    MAGNESIUM-OXIDE 400 (241.3 Mg) MG tablet, TAKE 1 TABLET BY MOUTH  EVERY DAY AFTER SUPPER, Disp: 90 tablet, Rfl: 2   Multiple Vitamins-Minerals (DIABETES SUPPORT PO), Take 1 tablet by mouth daily., Disp: , Rfl:    polyethylene glycol (MIRALAX / GLYCOLAX) packet, Take 17 g by mouth daily., Disp: , Rfl:    SYNTHROID 75 MCG tablet, TAKE 1 TABLET BY MOUTH  DAILY BEFORE BREAKFAST (Patient taking differently: Monday - Friday), Disp: 90 tablet, Rfl: 3   vitamin B-6 (PYRIDOXINE) 25 MG tablet, Take 25 mg by mouth daily., Disp: , Rfl:    tiZANidine (ZANAFLEX) 4 MG tablet, , Disp: , Rfl:    Allergies  Allergen Reactions   Codeine Nausea And Vomiting   Tramadol Nausea And Vomiting   Macrobid [Nitrofurantoin Macrocrystal] Rash     Review of Systems  Constitutional: Negative.   Eyes:  Negative for blurred vision.  Respiratory: Negative.  Negative for shortness of breath.   Cardiovascular: Negative.  Negative for chest pain and palpitations.       She reports h/o two syncopal episodes. Both instances occurred in November. She is not sure what may have triggered her sx. She has since been evaluated by Cardiology - she has more cardiac testing scheduled. She denies having any recurrent sx at this time.   Gastrointestinal: Negative.   Psychiatric/Behavioral: Negative.    All other systems reviewed and are negative.   Today's Vitals   06/22/21 0905  BP: 134/70  Pulse: 81  Weight: 214 lb 9.6 oz (97.3 kg)  Height: 5' 1.6" (1.565 m)  PainSc: 0-No pain   Body mass index is 39.76 kg/m.  Wt Readings from Last 3 Encounters:  06/22/21 214 lb 9.6 oz (97.3 kg)  06/08/21 214 lb 3.2 oz (97.2 kg)  06/06/21 217 lb 9.6 oz (98.7 kg)    BP Readings from Last 3 Encounters:  06/22/21 134/70  06/06/21 130/70  05/30/21 129/61    Objective:  Physical Exam Vitals and nursing note reviewed.  Constitutional:      Appearance: Normal appearance. She is obese.  HENT:     Head: Normocephalic and atraumatic.     Nose:     Comments: Masked     Mouth/Throat:     Comments: Masked   Eyes:     Extraocular Movements: Extraocular movements intact.  Cardiovascular:     Rate and Rhythm: Normal rate and regular rhythm.     Heart sounds: Normal heart sounds.  Pulmonary:     Effort: Pulmonary effort is normal.     Breath sounds: Normal breath sounds.  Musculoskeletal:     Cervical back: Normal range of motion.  Skin:    General: Skin is warm.  Neurological:     General: No focal deficit present.     Mental Status: She is alert.  Psychiatric:        Mood and Affect: Mood normal.        Behavior: Behavior normal.  Assessment And Plan:     1. Uncontrolled type 2 diabetes mellitus with hyperglycemia (HCC) Comments: Chronic, I will check labs as below. I will adjust meds as needed.  She will f/u in 3-4 months for re-evaluation.  - BMP8+EGFR - Hemoglobin A1c  2. Essential hypertension Comments: Chronic, fair control. Goal BP <130/80. Encouraged to follow a low sodium diet. No med changes today. Reminded to avoid processed meats as well.   3. Syncope, unspecified syncope type Comments: Currently awaiting echo/cardiac stress test results. Cardiology input appreciated. She is encouraged to stay well hydrated.   4. Primary hypothyroidism Comments: I will check thyroid panel and adjust meds as needed. I will check lipid panel as well.  - TSH + free T4  5. Class 2 severe obesity due to excess calories with serious comorbidity and body mass index (BMI) of 39.0 to 39.9 in adult [facility]) Comments: She is encouraged to strive for BMI less than 30 to decrease cardiac risk. Advised to aim for at least 150 minutes of exercise per week.    Patient was given opportunity to ask questions. Patient verbalized understanding of the plan and was able to repeat key elements of the plan. All questions were answered to their satisfaction.   I, [name], MD, have reviewed all documentation for this visit. The documentation on 07/05/21 for the exam, diagnosis, procedures, and  orders are all accurate and complete.   IF YOU HAVE BEEN REFERRED TO A SPECIALIST, IT MAY TAKE 1-2 WEEKS TO SCHEDULE/PROCESS THE REFERRAL. IF YOU HAVE NOT HEARD FROM US/SPECIALIST IN TWO WEEKS, PLEASE GIVE [place] A CALL AT [phone].   THE PATIENT IS ENCOURAGED TO PRACTICE SOCIAL DISTANCING DUE TO THE COVID-19 PANDEMIC.

## 2021-06-22 NOTE — Patient Instructions (Signed)

## 2021-06-23 LAB — BMP8+EGFR
BUN/Creatinine Ratio: 19 (ref 12–28)
BUN: 14 mg/dL (ref 8–27)
CO2: 23 mmol/L (ref 20–29)
Calcium: 11 mg/dL — ABNORMAL HIGH (ref 8.7–10.3)
Chloride: 103 mmol/L (ref 96–106)
Creatinine, Ser: 0.73 mg/dL (ref 0.57–1.00)
Glucose: 114 mg/dL — ABNORMAL HIGH (ref 70–99)
Potassium: 4.7 mmol/L (ref 3.5–5.2)
Sodium: 141 mmol/L (ref 134–144)
eGFR: 82 mL/min/{1.73_m2} (ref >59)

## 2021-06-23 LAB — HEMOGLOBIN A1C
Est. average glucose Bld gHb Est-mCnc: 146 mg/dL
Hgb A1c MFr Bld: 6.7 % — ABNORMAL HIGH (ref 4.8–5.6)

## 2021-06-23 LAB — TSH+FREE T4
Free T4: 1.77 ng/dL (ref 0.82–1.77)
TSH: 0.913 u[IU]/mL (ref 0.450–4.500)

## 2021-06-26 ENCOUNTER — Encounter: Payer: Self-pay | Admitting: Internal Medicine

## 2021-06-27 LAB — PROTEIN ELECTROPHORESIS
A/G Ratio: 1.3 (ref 0.7–1.7)
Albumin ELP: 3.7 g/dL (ref 2.9–4.4)
Alpha 1: 0.2 g/dL (ref 0.0–0.4)
Alpha 2: 0.6 g/dL (ref 0.4–1.0)
Beta: 0.9 g/dL (ref 0.7–1.3)
Gamma Globulin: 1 g/dL (ref 0.4–1.8)
Globulin, Total: 2.8 g/dL (ref 2.2–3.9)
Total Protein: 6.5 g/dL (ref 6.0–8.5)

## 2021-06-27 LAB — SPECIMEN STATUS REPORT

## 2021-07-06 ENCOUNTER — Other Ambulatory Visit: Payer: Self-pay

## 2021-07-06 DIAGNOSIS — E785 Hyperlipidemia, unspecified: Secondary | ICD-10-CM

## 2021-07-11 ENCOUNTER — Other Ambulatory Visit: Payer: Medicare Other

## 2021-07-11 ENCOUNTER — Other Ambulatory Visit: Payer: Self-pay

## 2021-07-11 DIAGNOSIS — E785 Hyperlipidemia, unspecified: Secondary | ICD-10-CM

## 2021-07-12 LAB — LIPID PANEL
Chol/HDL Ratio: 3 ratio (ref 0.0–4.4)
Cholesterol, Total: 145 mg/dL (ref 100–199)
HDL: 48 mg/dL (ref >39)
LDL Chol Calc (NIH): 81 mg/dL (ref 0–99)
Triglycerides: 85 mg/dL (ref 0–149)
VLDL Cholesterol Cal: 16 mg/dL (ref 5–40)

## 2021-07-14 ENCOUNTER — Other Ambulatory Visit: Payer: Self-pay

## 2021-07-14 MED ORDER — ROSUVASTATIN CALCIUM 20 MG PO TABS: 20.0000 mg | ORAL_TABLET | Freq: Every day | ORAL | 1 refills | Status: DC

## 2021-07-15 ENCOUNTER — Other Ambulatory Visit: Payer: Self-pay | Admitting: Internal Medicine

## 2021-07-15 MED ORDER — BENZONATATE 100 MG PO CAPS: 100.0000 mg | ORAL_CAPSULE | Freq: Three times a day (TID) | ORAL | 1 refills | Status: DC | PRN

## 2021-07-17 ENCOUNTER — Ambulatory Visit: Payer: Medicare Other | Admitting: Student

## 2021-07-17 ENCOUNTER — Encounter: Payer: Self-pay | Admitting: Student

## 2021-07-17 NOTE — Progress Notes (Deleted)
Primary Physician/Referring:  [name], MD  Patient ID: [name], female    DOB: 03-03-1941, 81 y.o.   MRN: [id number]  No chief complaint on file.  HPI:    [name]  is a 81 y.o. AA female with history of hypertension, hyperlipidemia, diabetes mellitus, hypothyroidism, vertigo.  She presented to emergency department 05/30/2021 following an episode of syncope.  Denies history of MI, TIA/CVA, sleep apnea, family history of premature CAD.  Given episode of syncope, at last visit ordered stress test, echocardiogram, and cardiac monitor as well as ABI given abnormal vascular exam.  Cardiac monitor revealed rare PACs and PVCs as well as a single episode of SVT, no evidence of significant cardiac arrhythmias.  Stress test was overall low risk with normal myocardial perfusion.  ABI was essentially normal bilaterally.  Echocardiogram revealed LVEF of 60% with mild LVH and no other significant abnormality.   She now presents for follow up. ***  ***Consider loop implant. Low risk for carpal tunnel surgery. Needs lipid panel.   Patient reports she was in her home getting ready on the morning of 05/2021.  She states she was standing in her bathroom she suddenly lost consciousness.  Patient states she woke up on the floor without recollection of the event.  The syncopal episode was unwitnessed.  Patient denies prodromal symptoms.  Denies postictal state or loss of bowel or bladder control.  Patient is relatively active, particularly for her age, doing water aerobics 4 days/week without issue.  Patient does continue to have episodes of lightheadedness, particularly with positional changes.  Past Medical History:  Diagnosis Date   Arthritis    RIGHT KNEE PAIN AND OA   Chest pain 05/03/2015   Diabetes mellitus without complication (HCC)    Elevated cholesterol    GERD (gastroesophageal reflux disease)    Hypertension    Hypothyroidism    IBS (irritable bowel syndrome)    Rotator cuff  disorder    BOTH SHOULDERS - NO SURGERY - AND STATES LIMITATIONS IN ARM MOVEMENT   Shortness of breath 05/03/2015   Swelling    CHRONIC SWELLING RIGHT HAND AND BOTH FEET AND BOTH LEGS   Vertigo    Past Surgical History:  Procedure Laterality Date   ABDOMINAL HYSTERECTOMY  1985   BREAST SURGERY  1985   RIGHT BREAST BIOPSY - BENIGN   CARPAL TUNNEL RELEASE Left 02/05/2019   Procedure: LEFT CARPAL TUNNEL RELEASE;  Surgeon: Cindee Salt, MD;  Location: [facility];  Service: Orthopedics;  Laterality: Left;   CHOLECYSTECTOMY  1975   EYE SURGERY  1995   BILATERAL CATARACT EXTRACTIONS; SURGERY FOR MACULAR HOLE    HEMORRHOID SURGERY  1975   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   Procedure: RIGHT TOTAL KNEE ARTHROPLASTY;  Surgeon: Loanne Drilling, MD;  Location: [facility];  Service: Orthopedics;  Laterality: Right;   Family History  Problem Relation Age of Onset   Diabetes Mother    Dementia Mother    Diabetes Father    Diabetes Sister    Breast cancer Sister    Kidney cancer Sister    Lung cancer Sister    Lung cancer Maternal Aunt    Pancreatic cancer Maternal Aunt    Cancer Other     Social History   Tobacco Use   Smoking status: Never   Smokeless tobacco: Never  Substance Use Topics   Alcohol use: Not Currently    Comment: OCCAS ALCOHOL   Marital Status: Widowed  ROS  Review of Systems  Constitutional: Negative for malaise/fatigue and weight gain.  Cardiovascular:  Positive for syncope. Negative for chest pain, claudication, leg swelling, near-syncope, orthopnea, palpitations and paroxysmal nocturnal dyspnea.  Respiratory:  Negative for shortness of breath.   Neurological:  Positive for light-headedness.   Objective  There were no vitals taken for this visit.  Vitals with BMI 06/22/2021 06/08/2021 06/06/2021  Height 5' 1.6" 5' 1.6" 5' 1.6"  Weight 214 lbs 10 oz 214 lbs 3 oz 217 lbs 10 oz  BMI 39.74 95.28 41.3  Systolic 244 - 010  Diastolic 70 - 70   Pulse 81 - 80    No data found.    Physical Exam Vitals reviewed.  Cardiovascular:     Rate and Rhythm: Normal rate and regular rhythm.     Pulses: Intact distal pulses.          Carotid pulses are 2+ on the right side and 2+ on the left side.      Radial pulses are 2+ on the right side and 2+ on the left side.       Popliteal pulses are 2+ on the right side and 2+ on the left side.       Dorsalis pedis pulses are 0 on the right side and 0 on the left side.       Posterior tibial pulses are 0 on the right side and 0 on the left side.     Heart sounds: S1 normal and S2 normal. No murmur heard.   No gallop.  Pulmonary:     Effort: Pulmonary effort is normal. No respiratory distress.     Breath sounds: No wheezing, rhonchi or rales.  Musculoskeletal:     Right lower leg: No edema.     Left lower leg: No edema.  Skin:    Capillary Refill: Capillary refill takes less than 2 seconds.  Neurological:     Mental Status: She is alert.    Laboratory examination:   Recent Labs    10/07/20 1658 10/13/20 1243 01/02/21 1243 02/23/21 1040 05/30/21 1809 06/22/21 0933  NA 137   < > 139 142 137 141  K 4.0   < > 3.9 4.4 3.9 4.7  CL 105   < > 105 108* 105 103  CO2 27   < > $R'26 23 23 23  'Db$ GLUCOSE 114*   < > 126* 116* 104* 114*  BUN 17   < > $R'14 19 17 14  'TK$ CREATININE 0.58   < > 0.67 0.73 0.68 0.73  CALCIUM 10.3   < > 10.2 10.7* 10.4* 11.0*  GFRNONAA >60  --  >60  --  >60  --    < > = values in this interval not displayed.    CrCl cannot be calculated (Patient's most recent lab result is older than the maximum 21 days allowed.).  CMP Latest Ref Rng & Units 06/22/2021 05/30/2021 02/23/2021  Glucose 70 - 99 mg/dL 114(H) 104(H) 116(H)  BUN 8 - 27 mg/dL $Remove'14 17 19  'gozSMqe$ Creatinine 0.57 - 1.00 mg/dL 0.73 0.68 0.73  Sodium 134 - 144 mmol/L 141 137 142  Potassium 3.5 - 5.2 mmol/L 4.7 3.9 4.4  Chloride 96 - 106 mmol/L 103 105 108(H)  CO2 20 - 29 mmol/L $RemoveB'23 23 23  'PAuSxdmi$ Calcium 8.7 - 10.3 mg/dL 11.0(H)  10.4(H) 10.7(H)  Total Protein 6.0 - 8.5 g/dL 6.5 7.0 6.6  Total Bilirubin 0.3 - 1.2 mg/dL - 0.7 0.6  Alkaline Phos  38 - 126 U/L - 109 117  AST 15 - 41 U/L - 25 22  ALT 0 - 44 U/L - 32 33(H)   CBC Latest Ref Rng & Units 05/30/2021 01/02/2021 10/07/2020  WBC 4.0 - 10.5 K/uL 5.3 4.5 5.7  Hemoglobin 12.0 - 15.0 g/dL 16.3(H) 15.6(H) 14.6  Hematocrit 36.0 - 46.0 % 48.2(H) 45.5 43.3  Platelets 150 - 400 K/uL 186 178 162    Lipid Panel Recent Labs    07/11/21 1440  CHOL 145  TRIG 85  LDLCALC 81  HDL 48  CHOLHDL 3.0    HEMOGLOBIN A1C Lab Results  Component Value Date   HGBA1C 6.7 (H) 06/22/2021   TSH Recent Labs    10/13/20 1243 05/30/21 1824 06/22/21 0933  TSH 1.030 0.993 0.913     External labs:   None  Allergies   Allergies  Allergen Reactions   Codeine Nausea And Vomiting   Tramadol Nausea And Vomiting   Macrobid [Nitrofurantoin Macrocrystal] Rash    Medications Prior to Visit:   Outpatient Medications Prior to Visit  Medication Sig Dispense Refill   Aspirin (ECOTRIN PO) Take 81 mg by mouth daily.      BENICAR 20 MG tablet TAKE 1 TABLET BY MOUTH  DAILY 90 tablet 3   benzonatate (TESSALON PERLES) 100 MG capsule Take 1 capsule (100 mg total) by mouth 3 (three) times daily as needed for cough. 30 capsule 1   bimatoprost (LUMIGAN) 0.01 % SOLN Place 1 drop into both eyes at bedtime.      Blood Glucose Monitoring Suppl (BLOOD GLUCOSE MONITOR SYSTEM) w/Device KIT Use to check blood sugar 3 times a day. 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     furosemide (LASIX) 20 MG tablet TAKE 1 TABLET BY MOUTH IN  THE MORNING 90 tablet 3   glucose blood (ONETOUCH VERIO) test strip Use as instructed to check blood sugars 3 times daily E11.69 100 each 12   Lancets (ONETOUCH DELICA PLUS FSELTR32Y) MISC USE ONE LANCET 3 TIMES  DAILY BEFORE MEALS 300 each 3   Lifitegrast (XIIDRA) 5 % SOLN Apply to eye. xiidra     MAGNESIUM-OXIDE 400 (241.3 Mg) MG  tablet TAKE 1 TABLET BY MOUTH EVERY DAY AFTER SUPPER 90 tablet 2   Multiple Vitamins-Minerals (DIABETES SUPPORT PO) Take 1 tablet by mouth daily.     polyethylene glycol (MIRALAX / GLYCOLAX) packet Take 17 g by mouth daily.     rosuvastatin (CRESTOR) 20 MG tablet Take 1 tablet (20 mg total) by mouth daily. 90 tablet 1   SYNTHROID 75 MCG tablet TAKE 1 TABLET BY MOUTH  DAILY BEFORE BREAKFAST (Patient taking differently: Monday - Friday) 90 tablet 3   tiZANidine (ZANAFLEX) 4 MG tablet  (Patient not taking: Reported on 06/22/2021)     vitamin B-6 (PYRIDOXINE) 25 MG tablet Take 25 mg by mouth daily.     No facility-administered medications prior to visit.   Final Medications at End of Visit    No outpatient medications have been marked as taking for the 07/17/21 encounter (Appointment) with Rayetta Pigg, Sarahy Creedon C, PA-C.   Radiology:   No results found.  Cardiac Studies:  Ambulatory cardiac telemetry 14 days 06/08/2021 - 06/22/2021: Predominant underlying rhythm was sinus with single episode of SVT lasting 6 beats at a maximum rate of 167 bpm.  Rare PACs and PVCs.  There were no patient triggered events.  No evidence of significant cardiac arrhythmias.  Lexiscan Tetrofosmin stress test 06/21/2021: No previous exam available for comparison. Lexiscan nuclear stress test performed using 1-day protocol. Mild decrease in apical counts likely due to breast tissue attenuation, with imaging performed in sitting position. Otherwise, normal myocardial perfusion. Stress LVEF 55%. Low risk study.  ABI 06/20/2021:  This exam reveals normal perfusion of the right lower extremity (ABI 1.14)  and normal perfusion of the left lower extremity (ABI 1.17). Mildly  abnormal biphasic waveform pattern left DP, otherwise normal triphasic  waveform pattern.  Echocardiogram 06/20/2021:  Study Quality: Technically difficult study.  Normal LV systolic function with visual EF 60%. Left ventricle cavity is  normal in  size. Mild left ventricular hypertrophy. Normal global wall  motion. Normal diastolic filling pattern, normal LAP. Calculated EF 60%.  No significant valvular heart disease.  No prior study for comparison.  EKG:   05/28/2021: Sinus rhythm at 62 bpm.  Left axis.  LVH per voltage criteria with secondary ST-T changes.   Assessment   No diagnosis found.    There are no discontinued medications.   No orders of the defined types were placed in this encounter.   Recommendations:   [name] is a 46 y.o. AA female with history of hypertension, hyperlipidemia, diabetes mellitus, hypothyroidism, vertigo.  She presented to emergency department 05/30/2021 following an episode of syncope.  Denies history of MI, TIA/CVA, sleep apnea, family history of premature CAD.  Given episode of syncope, at last visit ordered stress test, echocardiogram, and cardiac monitor as well as ABI given abnormal vascular exam.  Cardiac monitor revealed rare PACs and PVCs as well as a single episode of SVT, no evidence of significant cardiac arrhythmias.  Stress test was overall low risk with normal myocardial perfusion.  ABI was essentially normal bilaterally.  Echocardiogram revealed LVEF of 60% with mild LVH and no other significant abnormality.   She now presents for follow up. ***  ***Consider loop implant. Low risk for carpal tunnel surgery. Needs lipid panel.   Patient's syncopal episode is concerning for cardiac etiology given lack of prodrome of symptoms.  Given multiple cardiovascular risk factors and recent symptoms recommend further evaluation with echocardiogram, stress test, and cardiac monitor.  We will proceed with nuclear stress test, however patient is not a candidate for treadmill given Lightheadedness and history of syncope. Will obtain 2-week cardiac monitor to evaluate for underlying arrhythmias, however if it is negative would consider loop recorder implantation.   On exam patient's pedal pulses  are reduced, will therefore obtain ABI.  Patient's blood pressure is mildly elevated in the office today, she is not orthostatic.  Patient does report well-controlled home blood pressure readings, therefore will not make changes to medications at this time.  However advised her to bring written log of home blood pressure readings to next office visit. Could also consider lipid profile testing in the future as recent testing is not available for review.   Further recommendations pending results of cardiac testing.   Patient was seen in collaboration with Dr. Einar Gip. He also reviewed patient's chart and examined the patient. Dr. Einar Gip is in agreement of the plan. I personally reviewed external records from recent ED visit.   During this visit I reviewed and updated: Tobacco history   allergies  medication reconciliation   medical history   surgical history   family history   social history.    Alethia Berthold, PA-C 07/17/2021, 9:48 AM Office: [phone]

## 2021-07-18 ENCOUNTER — Other Ambulatory Visit: Payer: Self-pay

## 2021-07-18 ENCOUNTER — Encounter (HOSPITAL_BASED_OUTPATIENT_CLINIC_OR_DEPARTMENT_OTHER): Payer: Self-pay | Admitting: Orthopedic Surgery

## 2021-07-18 ENCOUNTER — Telehealth: Payer: Self-pay

## 2021-07-18 NOTE — Progress Notes (Signed)
Chronic Care Management Pharmacy Assistant   Name: [name]  MRN: [id number] DOB: September 14, 1940  Reason for Encounter: Disease State/Hypertension Adherence Call    Recent office visits:   06/22/2021 Glendale Chard MD (PCP) stop Apoaequorin 1 tablet oral daily  Recent consult visits:   07/15/2021 Katheran James NP ( [facility]) start albuterol 108 (90 Base) MCG/ACT inhaler Inhale 2 puffs every 6 (six) hours if needed for wheezing.   06/08/2021 Lawerance Cruel PA-C (Cardiology) stop Cyclobenzaprine   06/06/2021 Bary Castilla NP (PCP)    06/02/2021 Sheran Luz MD (Orthopaedics)  05/30/2021 Bary Castilla NP (PCP)   Hospital visits:  Medication Reconciliation was completed by comparing discharge summary, patients EMR and Pharmacy list, and upon discussion with patient.  Emergency Department on 05/30/2021 due to Syncope, unspecified syncope type,  Urinary tract infection without hematuria, site unspecified . Discharge date was 05/30/2021. Discharged from [facility] Emergency Department.    New?Medications Started at [facility] Discharge:?? -started Keflex 500 mg 1 capsule by mouth 2 times a day for 7 days due to Urinary tract infection without hematuria, site unspecified   Medication Changes at Hospital Discharge: None  Medications Discontinued at Hospital Discharge:None  Medications that remain the same after Hospital Discharge:??  -All other medications will remain the same.    Medications: Outpatient Encounter Medications as of 07/18/2021  Medication Sig   Aspirin (ECOTRIN PO) Take 81 mg by mouth daily.    BENICAR 20 MG tablet TAKE 1 TABLET BY MOUTH  DAILY   benzonatate (TESSALON PERLES) 100 MG capsule Take 1 capsule (100 mg total) by mouth 3 (three) times daily as needed for cough.   bimatoprost (LUMIGAN) 0.01 % SOLN Place 1 drop into both eyes at bedtime.    Blood Glucose Monitoring Suppl (BLOOD GLUCOSE MONITOR SYSTEM) w/Device KIT Use to  check blood sugar 3 times a day. Dx code e11.65   famotidine (PEPCID) 20 MG tablet Take by mouth.   furosemide (LASIX) 20 MG tablet TAKE 1 TABLET BY MOUTH IN  THE MORNING   glucose blood (ONETOUCH VERIO) test strip Use as instructed to check blood sugars 3 times daily E11.69   Lancets (ONETOUCH DELICA PLUS LOVFIE33I) MISC USE ONE LANCET 3 TIMES  DAILY BEFORE MEALS   Lifitegrast (XIIDRA) 5 % SOLN Apply to eye. xiidra   MAGNESIUM-OXIDE 400 (241.3 Mg) MG tablet TAKE 1 TABLET BY MOUTH EVERY DAY AFTER SUPPER   Multiple Vitamins-Minerals (DIABETES SUPPORT PO) Take 1 tablet by mouth daily.   polyethylene glycol (MIRALAX / GLYCOLAX) packet Take 17 g by mouth daily.   rosuvastatin (CRESTOR) 20 MG tablet Take 1 tablet (20 mg total) by mouth daily.   SYNTHROID 75 MCG tablet TAKE 1 TABLET BY MOUTH  DAILY BEFORE BREAKFAST (Patient taking differently: 50 mcg. Monday - Friday)   vitamin B-6 (PYRIDOXINE) 25 MG tablet Take 25 mg by mouth daily.   No facility-administered encounter medications on file as of 07/18/2021.    Recent Office Vitals: BP Readings from Last 3 Encounters:  06/22/21 134/70  06/06/21 130/70  05/30/21 129/61   Pulse Readings from Last 3 Encounters:  06/22/21 81  06/06/21 80  05/30/21 73    Wt Readings from Last 3 Encounters:  06/22/21 214 lb 9.6 oz (97.3 kg)  06/08/21 214 lb 3.2 oz (97.2 kg)  06/06/21 217 lb 9.6 oz (98.7 kg)     Kidney Function Lab Results  Component Value Date/Time   CREATININE 0.73 06/22/2021 09:33 AM  CREATININE 0.68 05/30/2021 06:09 PM   GFRNONAA >60 05/30/2021 06:09 PM   GFRAA 92 07/13/2020 03:50 PM    BMP Latest Ref Rng & Units 06/22/2021 05/30/2021 02/23/2021  Glucose 70 - 99 mg/dL 114(H) 104(H) 116(H)  BUN 8 - 27 mg/dL _0 Creatinine 0.57 - 1.00 mg/dL 0.73 0.68 0.73  BUN/Creat Ratio 12 - 28 19 - 26  Sodium 134 - 144 mmol/L 141 137 142  Potassium 3.5 - 5.2 mmol/L 4.7 3.9 4.4  Chloride 96 - 106 mmol/L 103 105 108(H)  CO2 20 - 29 mmol/L  _1 Calcium 8.7 - 10.3 mg/dL 11.0(H) 10.4(H) 10.7(H)    Called patient 07/18/2021 Left message Called patient 07/19/2021 Left message Called patient 07/21/2021 Left message  Care Gaps: Last annual wellness visit- 03/29/2021  Star Rating Drugs:  Medication:  Last Fill: Day Supply   Benicar 20 mg tablet      7/28, 10/16          90 DS Crestor 10 mg tablet       10/17, 07/14/21     90 DS  Cherlyn Labella Clinical Pharmacist Assistant [phone]

## 2021-07-20 ENCOUNTER — Encounter: Payer: Medicare Other | Admitting: Internal Medicine

## 2021-07-20 ENCOUNTER — Encounter (HOSPITAL_BASED_OUTPATIENT_CLINIC_OR_DEPARTMENT_OTHER)
Admission: RE | Admit: 2021-07-20 | Discharge: 2021-07-20 | Disposition: A | Discharge status: Home / Self Care | Payer: Medicare Other | Source: Ambulatory Visit | Attending: Orthopedic Surgery | Admitting: Orthopedic Surgery

## 2021-07-20 DIAGNOSIS — Z01812 Encounter for preprocedural laboratory examination: Secondary | ICD-10-CM | POA: Diagnosis present

## 2021-07-20 LAB — BASIC METABOLIC PANEL
Anion gap: 9 (ref 5–15)
BUN: 15 mg/dL (ref 8–23)
CO2: 28 mmol/L (ref 22–32)
Calcium: 10.8 mg/dL — ABNORMAL HIGH (ref 8.9–10.3)
Chloride: 106 mmol/L (ref 98–111)
Creatinine, Ser: 0.71 mg/dL (ref 0.44–1.00)
GFR, Estimated: >60 mL/min (ref >60)
Glucose, Bld: 120 mg/dL — ABNORMAL HIGH (ref 70–99)
Potassium: 4.5 mmol/L (ref 3.5–5.1)
Sodium: 143 mmol/L (ref 135–145)

## 2021-07-20 NOTE — Progress Notes (Signed)

## 2021-07-24 ENCOUNTER — Encounter: Payer: Self-pay | Admitting: Student

## 2021-07-24 ENCOUNTER — Other Ambulatory Visit: Payer: Self-pay

## 2021-07-24 ENCOUNTER — Ambulatory Visit: Payer: Medicare Other | Admitting: Student

## 2021-07-24 VITALS — BP 127/58 | HR 95 | Temp 97.0°F | Resp 16 | Ht 62.0 in | Wt 217.2 lb

## 2021-07-24 DIAGNOSIS — R55 Syncope and collapse: Secondary | ICD-10-CM

## 2021-07-24 DIAGNOSIS — I10 Essential (primary) hypertension: Secondary | ICD-10-CM

## 2021-07-24 NOTE — Progress Notes (Signed)
Primary Physician/Referring:  [name], MD  Patient ID: [name], female    DOB: Oct 23, 1940, 81 y.o.   MRN: [id number]  Chief Complaint  Patient presents with   cardiac testing results    6 weeks   HPI:    [name]  is a 5 y.o. AA female with history of hypertension, hyperlipidemia, diabetes mellitus, hypothyroidism, vertigo.  She presented to emergency department 05/30/2021 following an episode of syncope.  Denies history of MI, TIA/CVA, sleep apnea, family history of premature CAD.  Given episode of syncope, at last visit ordered stress test, echocardiogram, and cardiac monitor as well as ABI given abnormal vascular exam.  Cardiac monitor revealed rare PACs and PVCs as well as a single episode of SVT, no evidence of significant cardiac arrhythmias.  Stress test was overall low risk with normal myocardial perfusion.  ABI was essentially normal bilaterally.  Echocardiogram revealed LVEF of 60% with mild LVH and no other significant abnormality.   She now presents for follow up.  Patient is feeling well overall without specific complaints today.  She does states she has occasional mild dyspnea on exertion which is stable and longstanding.  Denies recurrence of syncope or near syncope.  Denies orthopnea, PND, leg edema, chest pain, dizziness, lightheadedness.  Past Medical History:  Diagnosis Date   Arthritis    RIGHT KNEE PAIN AND OA   Chest pain 05/03/2015   Diabetes mellitus without complication (HCC)    Elevated cholesterol    GERD (gastroesophageal reflux disease)    Hypertension    Hypothyroidism    IBS (irritable bowel syndrome)    Rotator cuff disorder    BOTH SHOULDERS - NO SURGERY - AND STATES LIMITATIONS IN ARM MOVEMENT   Shortness of breath 05/03/2015   Swelling    CHRONIC SWELLING RIGHT HAND AND BOTH FEET AND BOTH LEGS   Vertigo    Past Surgical History:  Procedure Laterality Date   ABDOMINAL HYSTERECTOMY  1985   BREAST SURGERY  1985   RIGHT BREAST  BIOPSY - BENIGN   CARPAL TUNNEL RELEASE Left 02/05/2019   Procedure: LEFT CARPAL TUNNEL RELEASE;  Surgeon: Cindee Salt, MD;  Location: [facility];  Service: Orthopedics;  Laterality: Left;   CHOLECYSTECTOMY  1975   EYE SURGERY  1995   BILATERAL CATARACT EXTRACTIONS; SURGERY FOR MACULAR HOLE    HEMORRHOID SURGERY  1975   JOINT REPLACEMENT     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   Procedure: RIGHT TOTAL KNEE ARTHROPLASTY;  Surgeon: Loanne Drilling, MD;  Location: [facility];  Service: Orthopedics;  Laterality: Right;   Family History  Problem Relation Age of Onset   Diabetes Mother    Dementia Mother    Diabetes Father    Diabetes Sister    Breast cancer Sister    Kidney cancer Sister    Lung cancer Sister    Lung cancer Maternal Aunt    Pancreatic cancer Maternal Aunt    Cancer Other     Social History   Tobacco Use   Smoking status: Never   Smokeless tobacco: Never  Substance Use Topics   Alcohol use: Not Currently    Comment: OCCAS ALCOHOL   Marital Status: Widowed   ROS  Review of Systems  Constitutional: Negative for malaise/fatigue and weight gain.  Cardiovascular:  Positive for dyspnea on exertion (mild, stable). Negative for chest pain, claudication, leg swelling, near-syncope, orthopnea, palpitations, paroxysmal nocturnal dyspnea and syncope (no recurrence).  Neurological:  Negative  for light-headedness (resolved).   Objective  Blood pressure (!) 127/58, pulse 95, temperature (!) 97 F (36.1 C), temperature source Temporal, resp. rate 16, height $RemoveBe'5\' 2"'MmGTdjDef$  (1.575 m), weight 217 lb 3.2 oz (98.5 kg), SpO2 98 %.  Vitals with BMI 07/24/2021 07/18/2021 06/22/2021  Height $Remov'5\' 2"'OgihMd$  $Remove'5\' 2"'HpmRSAP$  5' 1.6"  Weight 217 lbs 3 oz 214 lbs 8 oz 214 lbs 10 oz  BMI 39.72 89.16 94.50  Systolic 388 - 828  Diastolic 58 - 70  Pulse 95 - 81    Orthostatic VS for the past 72 hrs (Last 3 readings):  Patient Position BP Location Cuff Size  07/24/21 1344 Sitting Left Arm Large      Physical Exam Vitals reviewed.  Cardiovascular:     Rate and Rhythm: Normal rate and regular rhythm.     Pulses: Intact distal pulses.          Carotid pulses are 2+ on the right side and 2+ on the left side.      Radial pulses are 2+ on the right side and 2+ on the left side.       Popliteal pulses are 2+ on the right side and 2+ on the left side.       Dorsalis pedis pulses are 0 on the right side and 0 on the left side.       Posterior tibial pulses are 0 on the right side and 0 on the left side.     Heart sounds: S1 normal and S2 normal. No murmur heard.   No gallop.  Pulmonary:     Effort: Pulmonary effort is normal. No respiratory distress.     Breath sounds: No wheezing, rhonchi or rales.  Musculoskeletal:     Right lower leg: No edema.     Left lower leg: No edema.  Skin:    Capillary Refill: Capillary refill takes less than 2 seconds.  Neurological:     Mental Status: She is alert.  Physical exam unchanged compared to previous office visit.  Laboratory examination:   Recent Labs    01/02/21 1243 02/23/21 1040 05/30/21 1809 06/22/21 0933 07/20/21 1427  NA 139   < > 137 141 143  K 3.9   < > 3.9 4.7 4.5  CL 105   < > 105 103 106  CO2 26   < > $R'23 23 28  'cI$ GLUCOSE 126*   < > 104* 114* 120*  BUN 14   < > $R'17 14 15  'pC$ CREATININE 0.67   < > 0.68 0.73 0.71  CALCIUM 10.2   < > 10.4* 11.0* 10.8*  GFRNONAA >60  --  >60  --  >60   < > = values in this interval not displayed.   estimated creatinine clearance is 59.5 mL/min (by C-G formula based on SCr of 0.71 mg/dL).  CMP Latest Ref Rng & Units 07/20/2021 06/22/2021 05/30/2021  Glucose 70 - 99 mg/dL 120(H) 114(H) 104(H)  BUN 8 - 23 mg/dL $Remove'15 14 17  'zngyhdS$ Creatinine 0.44 - 1.00 mg/dL 0.71 0.73 0.68  Sodium 135 - 145 mmol/L 143 141 137  Potassium 3.5 - 5.1 mmol/L 4.5 4.7 3.9  Chloride 98 - 111 mmol/L 106 103 105  CO2 22 - 32 mmol/L $RemoveB'28 23 23  'plpVLxJL$ Calcium 8.9 - 10.3 mg/dL 10.8(H) 11.0(H) 10.4(H)  Total Protein 6.0 - 8.5 g/dL - 6.5 7.0   Total Bilirubin 0.3 - 1.2 mg/dL - - 0.7  Alkaline Phos 38 - 126 U/L - - 109  AST 15 - 41 U/L - - 25  ALT 0 - 44 U/L - - 32   CBC Latest Ref Rng & Units 05/30/2021 01/02/2021 10/07/2020  WBC 4.0 - 10.5 K/uL 5.3 4.5 5.7  Hemoglobin 12.0 - 15.0 g/dL 16.3(H) 15.6(H) 14.6  Hematocrit 36.0 - 46.0 % 48.2(H) 45.5 43.3  Platelets 150 - 400 K/uL 186 178 162    Lipid Panel Recent Labs    07/11/21 1440  CHOL 145  TRIG 85  LDLCALC 81  HDL 48  CHOLHDL 3.0    HEMOGLOBIN A1C Lab Results  Component Value Date   HGBA1C 6.7 (H) 06/22/2021   TSH Recent Labs    10/13/20 1243 05/30/21 1824 06/22/21 0933  TSH 1.030 0.993 0.913    External labs:   None  Allergies   Allergies  Allergen Reactions   Codeine Nausea And Vomiting   Tramadol Nausea And Vomiting   Macrobid [Nitrofurantoin Macrocrystal] Rash    Medications Prior to Visit:   Outpatient Medications Prior to Visit  Medication Sig Dispense Refill   albuterol (VENTOLIN HFA) 108 (90 Base) MCG/ACT inhaler Inhale 1 puff into the lungs as needed.     Aspirin (ECOTRIN PO) Take 81 mg by mouth daily.      BENICAR 20 MG tablet TAKE 1 TABLET BY MOUTH  DAILY 90 tablet 3   benzonatate (TESSALON PERLES) 100 MG capsule Take 1 capsule (100 mg total) by mouth 3 (three) times daily as needed for cough. 30 capsule 1   bimatoprost (LUMIGAN) 0.01 % SOLN Place 1 drop into both eyes at bedtime.      Blood Glucose Monitoring Suppl (BLOOD GLUCOSE MONITOR SYSTEM) w/Device KIT Use to check blood sugar 3 times a day. 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     furosemide (LASIX) 20 MG tablet TAKE 1 TABLET BY MOUTH IN  THE MORNING 90 tablet 3   glucose blood (ONETOUCH VERIO) test strip Use as instructed to check blood sugars 3 times daily E11.69 100 each 12   Lancets (ONETOUCH DELICA PLUS BMSXJD55M) MISC USE ONE LANCET 3 TIMES  DAILY BEFORE MEALS 300 each 3   Lifitegrast (XIIDRA) 5 % SOLN Apply to eye. xiidra      MAGNESIUM-OXIDE 400 (241.3 Mg) MG tablet TAKE 1 TABLET BY MOUTH EVERY DAY AFTER SUPPER 90 tablet 2   polyethylene glycol (MIRALAX / GLYCOLAX) packet Take 17 g by mouth daily.     rosuvastatin (CRESTOR) 20 MG tablet Take 1 tablet (20 mg total) by mouth daily. 90 tablet 1   SYNTHROID 75 MCG tablet TAKE 1 TABLET BY MOUTH  DAILY BEFORE BREAKFAST (Patient taking differently: 50 mcg. Monday - Friday) 90 tablet 3   vitamin B-6 (PYRIDOXINE) 25 MG tablet Take 25 mg by mouth daily.     Multiple Vitamins-Minerals (DIABETES SUPPORT PO) Take 1 tablet by mouth daily.     No facility-administered medications prior to visit.   Final Medications at End of Visit    Current Meds  Medication Sig   albuterol (VENTOLIN HFA) 108 (90 Base) MCG/ACT inhaler Inhale 1 puff into the lungs as needed.   Aspirin (ECOTRIN PO) Take 81 mg by mouth daily.    BENICAR 20 MG tablet TAKE 1 TABLET BY MOUTH  DAILY   benzonatate (TESSALON PERLES) 100 MG capsule Take 1 capsule (100 mg total) by mouth 3 (three) times daily as needed for cough.   bimatoprost (LUMIGAN) 0.01 % SOLN Place  1 drop into both eyes at bedtime.    Blood Glucose Monitoring Suppl (BLOOD GLUCOSE MONITOR SYSTEM) w/Device KIT Use to check blood sugar 3 times a day. Dx code e11.65   famotidine (PEPCID) 20 MG tablet Take by mouth.   furosemide (LASIX) 20 MG tablet TAKE 1 TABLET BY MOUTH IN  THE MORNING   glucose blood (ONETOUCH VERIO) test strip Use as instructed to check blood sugars 3 times daily E11.69   Lancets (ONETOUCH DELICA PLUS [id number]) MISC USE ONE LANCET 3 TIMES  DAILY BEFORE MEALS   Lifitegrast (XIIDRA) 5 % SOLN Apply to eye. xiidra   MAGNESIUM-OXIDE 400 (241.3 Mg) MG tablet TAKE 1 TABLET BY MOUTH EVERY DAY AFTER SUPPER   polyethylene glycol (MIRALAX / GLYCOLAX) packet Take 17 g by mouth daily.   rosuvastatin (CRESTOR) 20 MG tablet Take 1 tablet (20 mg total) by mouth daily.   SYNTHROID 75 MCG tablet TAKE 1 TABLET BY MOUTH  DAILY BEFORE BREAKFAST  (Patient taking differently: 50 mcg. Monday - Friday)   vitamin B-6 (PYRIDOXINE) 25 MG tablet Take 25 mg by mouth daily.   Radiology:   No results found.  Cardiac Studies:  Ambulatory cardiac telemetry 14 days 06/08/2021 - 06/22/2021: Predominant underlying rhythm was sinus with single episode of SVT lasting 6 beats at a maximum rate of 167 bpm.  Rare PACs and PVCs.  There were no patient triggered events.  No evidence of significant cardiac arrhythmias.  Lexiscan Tetrofosmin stress test 06/21/2021: No previous exam available for comparison. Lexiscan nuclear stress test performed using 1-day protocol. Mild decrease in apical counts likely due to breast tissue attenuation, with imaging performed in sitting position. Otherwise, normal myocardial perfusion. Stress LVEF 55%. Low risk study.  ABI 06/20/2021:  This exam reveals normal perfusion of the right lower extremity (ABI 1.14)  and normal perfusion of the left lower extremity (ABI 1.17). Mildly  abnormal biphasic waveform pattern left DP, otherwise normal triphasic  waveform pattern.  Echocardiogram 06/20/2021:  Study Quality: Technically difficult study.  Normal LV systolic function with visual EF 60%. Left ventricle cavity is  normal in size. Mild left ventricular hypertrophy. Normal global wall  motion. Normal diastolic filling pattern, normal LAP. Calculated EF 60%.  No significant valvular heart disease.  No prior study for comparison.  EKG:  07/24/2021: Sinus rhythm at a rate of 74 bpm.  Left axis.  LVH with secondary ST-T wave changes.  Incomplete right bundle branch block.  Compared to previous EKG 05/28/2021, IRBBB new.   05/28/2021: Sinus rhythm at 62 bpm.  Left axis.  LVH per voltage criteria with secondary ST-T changes.   Assessment     ICD-10-CM   1. Syncope and collapse  R55     2. Essential hypertension  I10 EKG 12-Lead       Medications Discontinued During This Encounter  Medication Reason   Multiple  Vitamins-Minerals (DIABETES SUPPORT PO)     No orders of the defined types were placed in this encounter.    Recommendations:   [name] is a 51 y.o. AA female with history of hypertension, hyperlipidemia, diabetes mellitus, hypothyroidism, vertigo.  She presented to emergency department 05/30/2021 following an episode of syncope.  Denies history of MI, TIA/CVA, sleep apnea, family history of premature CAD.  Given episode of syncope, at last visit ordered stress test, echocardiogram, and cardiac monitor as well as ABI given abnormal vascular exam.  Cardiac monitor revealed rare PACs and PVCs as well as a single episode of  SVT, no evidence of significant cardiac arrhythmias.  Stress test was overall low risk with normal myocardial perfusion.  ABI was essentially normal bilaterally.  Echocardiogram revealed LVEF of 60% with mild LVH and no other significant abnormality.   She now presents for follow up.  Reviewed and discussed at length results of cardiac testing, details above.  No identifiable cardiovascular cause of patient's previous syncopal episode.  She has had no recurrence of syncope or near syncope and is relatively asymptomatic at this time.  She did recently have COVID-19 infection in December 2022 which is likely underlying etiology of her mild dyspnea on exertion which is improving.  Encourage patient to continue to increase her physical activity and monitor dyspnea.  Based on recent cardiac testing patient is relatively low risk for upcoming carpal tunnel release surgery.  Blood pressure is well controlled.  Given history of syncopal episode discussed loop recorder implantation.  Reviewed indications, risk, and benefits of loop implantation.  Patient verbalized understanding, however she wishes to hold off at this time.  Patient will notify our office immediately if she has recurrence of syncope or near syncope.  Patient has history of hyperlipidemia which is presently managed by  PCP, will defer to primary care.  Follow-up in 6 months, sooner if needed.   Alethia Berthold, PA-C 07/24/2021, 2:58 PM Office: [phone]

## 2021-07-25 ENCOUNTER — Encounter (HOSPITAL_BASED_OUTPATIENT_CLINIC_OR_DEPARTMENT_OTHER): Payer: Self-pay | Admitting: Orthopedic Surgery

## 2021-07-25 ENCOUNTER — Ambulatory Visit (HOSPITAL_BASED_OUTPATIENT_CLINIC_OR_DEPARTMENT_OTHER): Payer: Medicare Other | Admitting: Anesthesiology

## 2021-07-25 ENCOUNTER — Encounter (HOSPITAL_BASED_OUTPATIENT_CLINIC_OR_DEPARTMENT_OTHER)
Admission: RE | Disposition: A | Discharge status: Home / Self Care | Payer: Self-pay | Source: Home / Self Care | Attending: Orthopedic Surgery

## 2021-07-25 ENCOUNTER — Other Ambulatory Visit: Payer: Self-pay

## 2021-07-25 ENCOUNTER — Ambulatory Visit (HOSPITAL_BASED_OUTPATIENT_CLINIC_OR_DEPARTMENT_OTHER)
Admission: RE | Admit: 2021-07-25 | Discharge: 2021-07-25 | Disposition: A | Discharge status: Home / Self Care | Payer: Medicare Other | Attending: Orthopedic Surgery | Admitting: Orthopedic Surgery

## 2021-07-25 DIAGNOSIS — Z833 Family history of diabetes mellitus: Secondary | ICD-10-CM | POA: Diagnosis not present

## 2021-07-25 DIAGNOSIS — Z6839 Body mass index (BMI) 39.0-39.9, adult: Secondary | ICD-10-CM | POA: Diagnosis not present

## 2021-07-25 DIAGNOSIS — E119 Type 2 diabetes mellitus without complications: Secondary | ICD-10-CM | POA: Insufficient documentation

## 2021-07-25 DIAGNOSIS — M1990 Unspecified osteoarthritis, unspecified site: Secondary | ICD-10-CM | POA: Insufficient documentation

## 2021-07-25 DIAGNOSIS — I119 Hypertensive heart disease without heart failure: Secondary | ICD-10-CM | POA: Diagnosis not present

## 2021-07-25 DIAGNOSIS — E039 Hypothyroidism, unspecified: Secondary | ICD-10-CM | POA: Diagnosis not present

## 2021-07-25 DIAGNOSIS — D649 Anemia, unspecified: Secondary | ICD-10-CM | POA: Diagnosis not present

## 2021-07-25 DIAGNOSIS — I10 Essential (primary) hypertension: Secondary | ICD-10-CM

## 2021-07-25 DIAGNOSIS — K219 Gastro-esophageal reflux disease without esophagitis: Secondary | ICD-10-CM | POA: Diagnosis not present

## 2021-07-25 DIAGNOSIS — G5601 Carpal tunnel syndrome, right upper limb: Secondary | ICD-10-CM | POA: Diagnosis not present

## 2021-07-25 DIAGNOSIS — R0602 Shortness of breath: Secondary | ICD-10-CM | POA: Diagnosis not present

## 2021-07-25 HISTORY — PX: CARPAL TUNNEL RELEASE: SHX101

## 2021-07-25 LAB — GLUCOSE, CAPILLARY
Glucose-Capillary: 124 mg/dL — ABNORMAL HIGH (ref 70–99)
Glucose-Capillary: 127 mg/dL — ABNORMAL HIGH (ref 70–99)

## 2021-07-25 SURGERY — CARPAL TUNNEL RELEASE
Anesthesia: Regional | Site: Hand | Laterality: Right

## 2021-07-25 MED ORDER — TAPENTADOL HCL 50 MG PO TABS
50.0000 mg | ORAL_TABLET | Freq: Four times a day (QID) | ORAL | Status: DC
Start: 2021-07-25 — End: 2021-07-25

## 2021-07-25 MED ORDER — OXYCODONE HCL 5 MG/5ML PO SOLN: 5.0000 mg | Freq: Once | ORAL | Status: DC | PRN

## 2021-07-25 MED ORDER — PROPOFOL 10 MG/ML IV BOLUS
INTRAVENOUS | Status: DC | PRN
  Administered 2021-07-25 (×2): 20 mg via INTRAVENOUS

## 2021-07-25 MED ORDER — LIDOCAINE HCL (CARDIAC) PF 100 MG/5ML IV SOSY
PREFILLED_SYRINGE | INTRAVENOUS | Status: DC | PRN
  Administered 2021-07-25: 20 mg via INTRAVENOUS

## 2021-07-25 MED ORDER — ATROPINE SULFATE 0.4 MG/ML IV SOLN
INTRAVENOUS | Status: AC
  Filled 2021-07-25: qty 1

## 2021-07-25 MED ORDER — CEFAZOLIN SODIUM-DEXTROSE 2-4 GM/100ML-% IV SOLN
2.0000 g | INTRAVENOUS | Status: AC
  Administered 2021-07-25: 2 g via INTRAVENOUS

## 2021-07-25 MED ORDER — DROPERIDOL 2.5 MG/ML IJ SOLN: 0.6250 mg | Freq: Once | INTRAMUSCULAR | Status: DC | PRN

## 2021-07-25 MED ORDER — FENTANYL CITRATE (PF) 100 MCG/2ML IJ SOLN
INTRAMUSCULAR | Status: AC
  Filled 2021-07-25: qty 2

## 2021-07-25 MED ORDER — LIDOCAINE HCL (PF) 0.5 % IJ SOLN
INTRAMUSCULAR | Status: DC | PRN
  Administered 2021-07-25: 30 mL via INTRAVENOUS

## 2021-07-25 MED ORDER — CEFAZOLIN SODIUM-DEXTROSE 2-4 GM/100ML-% IV SOLN
INTRAVENOUS | Status: AC
  Filled 2021-07-25: qty 100

## 2021-07-25 MED ORDER — FENTANYL CITRATE (PF) 100 MCG/2ML IJ SOLN: 25.0000 ug | INTRAMUSCULAR | Status: DC | PRN

## 2021-07-25 MED ORDER — LACTATED RINGERS IV SOLN: INTRAVENOUS | Status: DC

## 2021-07-25 MED ORDER — LIDOCAINE 2% (20 MG/ML) 5 ML SYRINGE
INTRAMUSCULAR | Status: AC
  Filled 2021-07-25: qty 5

## 2021-07-25 MED ORDER — PROPOFOL 10 MG/ML IV BOLUS
INTRAVENOUS | Status: AC
  Filled 2021-07-25: qty 20

## 2021-07-25 MED ORDER — 0.9 % SODIUM CHLORIDE (POUR BTL) OPTIME
TOPICAL | Status: DC | PRN
  Administered 2021-07-25: 50 mL

## 2021-07-25 MED ORDER — ONDANSETRON HCL 4 MG/2ML IJ SOLN
INTRAMUSCULAR | Status: DC | PRN
  Administered 2021-07-25: 4 mg via INTRAVENOUS

## 2021-07-25 MED ORDER — OXYCODONE HCL 5 MG PO TABS: 5.0000 mg | ORAL_TABLET | Freq: Once | ORAL | Status: DC | PRN

## 2021-07-25 MED ORDER — EPHEDRINE 5 MG/ML INJ
INTRAVENOUS | Status: AC
  Filled 2021-07-25: qty 5

## 2021-07-25 MED ORDER — ONDANSETRON HCL 4 MG/2ML IJ SOLN
INTRAMUSCULAR | Status: AC
  Filled 2021-07-25: qty 2

## 2021-07-25 MED ORDER — PHENYLEPHRINE 40 MCG/ML (10ML) SYRINGE FOR IV PUSH (FOR BLOOD PRESSURE SUPPORT)
PREFILLED_SYRINGE | INTRAVENOUS | Status: AC
  Filled 2021-07-25: qty 10

## 2021-07-25 MED ORDER — SUCCINYLCHOLINE CHLORIDE 200 MG/10ML IV SOSY
PREFILLED_SYRINGE | INTRAVENOUS | Status: AC
  Filled 2021-07-25: qty 10

## 2021-07-25 MED ORDER — BUPIVACAINE HCL (PF) 0.25 % IJ SOLN
INTRAMUSCULAR | Status: DC | PRN
  Administered 2021-07-25: 9 mL

## 2021-07-25 MED ORDER — MIDAZOLAM HCL 2 MG/2ML IJ SOLN
INTRAMUSCULAR | Status: AC
  Filled 2021-07-25: qty 2

## 2021-07-25 MED ORDER — OXYCODONE-ACETAMINOPHEN 5-325 MG PO TABS
1.0000 | ORAL_TABLET | ORAL | 0 refills | Status: DC | PRN
Start: 2021-07-25 — End: 2021-10-10

## 2021-07-25 MED ORDER — FENTANYL CITRATE (PF) 100 MCG/2ML IJ SOLN
INTRAMUSCULAR | Status: DC | PRN
Start: 2021-07-25 — End: 2021-07-25
  Administered 2021-07-25: 50 ug via INTRAVENOUS

## 2021-07-25 SURGICAL SUPPLY — 40 items
APL PRP STRL LF DISP 70% ISPRP (MISCELLANEOUS) ×1
BLADE SURG 15 STRL LF DISP TIS (BLADE) ×1 IMPLANT
BLADE SURG 15 STRL SS (BLADE) ×4
BNDG CMPR 5X3 CHSV STRCH STRL (GAUZE/BANDAGES/DRESSINGS) ×1
BNDG CMPR 9X4 STRL LF SNTH (GAUZE/BANDAGES/DRESSINGS) ×1
BNDG COHESIVE 3X5 TAN ST LF (GAUZE/BANDAGES/DRESSINGS) ×2 IMPLANT
BNDG ESMARK 4X9 LF (GAUZE/BANDAGES/DRESSINGS) ×1 IMPLANT
BNDG GAUZE ELAST 4 BULKY (GAUZE/BANDAGES/DRESSINGS) ×2 IMPLANT
CHLORAPREP W/TINT 26 (MISCELLANEOUS) ×2 IMPLANT
CORD BIPOLAR FORCEPS 12FT (ELECTRODE) ×2 IMPLANT
COVER BACK TABLE 60X90IN (DRAPES) ×2 IMPLANT
COVER MAYO STAND STRL (DRAPES) ×2 IMPLANT
CUFF TOURN SGL QUICK 18 NS (TOURNIQUET CUFF) ×1 IMPLANT
CUFF TOURN SGL QUICK 18X4 (TOURNIQUET CUFF) ×1 IMPLANT
DRAPE EXTREMITY T 121X128X90 (DISPOSABLE) ×2 IMPLANT
DRAPE SURG 17X23 STRL (DRAPES) ×1 IMPLANT
DRSG PAD ABDOMINAL 8X10 ST (GAUZE/BANDAGES/DRESSINGS) ×2 IMPLANT
GAUZE SPONGE 4X4 12PLY STRL (GAUZE/BANDAGES/DRESSINGS) ×2 IMPLANT
GAUZE XEROFORM 1X8 LF (GAUZE/BANDAGES/DRESSINGS) ×2 IMPLANT
GLOVE SURG ORTHO LTX SZ8 (GLOVE) ×2 IMPLANT
GLOVE SURG POLYISO LF SZ6.5 (GLOVE) ×1 IMPLANT
GLOVE SURG POLYISO LF SZ7 (GLOVE) ×1 IMPLANT
GLOVE SURG UNDER POLY LF SZ6.5 (GLOVE) ×1 IMPLANT
GLOVE SURG UNDER POLY LF SZ7 (GLOVE) ×2 IMPLANT
GLOVE SURG UNDER POLY LF SZ8.5 (GLOVE) ×2 IMPLANT
GOWN STRL REUS W/ TWL LRG LVL3 (GOWN DISPOSABLE) ×1 IMPLANT
GOWN STRL REUS W/ TWL XL LVL3 (GOWN DISPOSABLE) IMPLANT
GOWN STRL REUS W/TWL LRG LVL3 (GOWN DISPOSABLE) ×2
GOWN STRL REUS W/TWL XL LVL3 (GOWN DISPOSABLE) ×4 IMPLANT
NDL HYPO 27GX1-1/4 (NEEDLE) IMPLANT
NEEDLE HYPO 27GX1-1/4 (NEEDLE) ×2 IMPLANT
NS IRRIG 1000ML POUR BTL (IV SOLUTION) ×2 IMPLANT
PACK BASIN DAY SURGERY FS (CUSTOM PROCEDURE TRAY) ×2 IMPLANT
STOCKINETTE 4X48 STRL (DRAPES) ×2 IMPLANT
SUT ETHILON 4 0 PS 2 18 (SUTURE) ×2 IMPLANT
SUT VICRYL 4-0 PS2 18IN ABS (SUTURE) IMPLANT
SYR BULB EAR ULCER 3OZ GRN STR (SYRINGE) ×2 IMPLANT
SYR CONTROL 10ML LL (SYRINGE) ×1 IMPLANT
TOWEL GREEN STERILE FF (TOWEL DISPOSABLE) ×2 IMPLANT
UNDERPAD 30X36 HEAVY ABSORB (UNDERPADS AND DIAPERS) ×2 IMPLANT

## 2021-07-25 NOTE — Op Note (Signed)
NAME: [name] MEDICAL RECORD NO: [id number] DATE OF BIRTH: 1940-09-28 FACILITY: [facility] LOCATION: [facility] PHYSICIAN: Wynonia Sours, MD   OPERATIVE REPORT   DATE OF PROCEDURE: 07/25/21    PREOPERATIVE DIAGNOSIS: Carpal tunnel syndrome right   POSTOPERATIVE DIAGNOSIS: Same   PROCEDURE: Decompression median nerve right hand   SURGEON: Daryll Brod, M.D.   ASSISTANT: none   ANESTHESIA:  Bier block with sedation and Local   INTRAVENOUS FLUIDS:  Per anesthesia flow sheet.   ESTIMATED BLOOD LOSS:  Minimal.   COMPLICATIONS:  None.   SPECIMENS:  none   TOURNIQUET TIME:    Total Tourniquet Time Documented: Forearm (Right) - 23 minutes Total: Forearm (Right) - 23 minutes    DISPOSITION:  Stable to PACU.   INDICATIONS: Patient is a 81 year old female with a history of carpal tunnel syndrome bilaterally she has undergone release on her left side is admitted now for release to the right.  Pre-.  Postoperative course been discussed along with risks and complications.  She is aware that there is no guarantee to the surgery the possibility of infection recurrence injury to arteries nerves tendons incomplete relief symptoms and dystrophy.  In preoperative area the patient seen the extremity marked by both patient and surgeon antibiotic given  OPERATIVE COURSE: Patient is brought the operating room placed in a supine position with the right arm free.  Forearm IV regional anesthetic was carried out without difficulty under the direction the anesthesia department.  She was prepped with ChloraPrep.  A 3-minute dry time was allowed and a timeout taken confirm patient procedure.  A longitudinal incision was made in the right palm carried down through subcutaneous tissue.  Bleeders electrocauterized with bipolar.  Palmar fascia was split.  The superficial palmar arch was identified along with the flexor tendon to the ring little finger.  Retractors were placed retracting median  nerve radially ulnar nerve ulnarly.  The flexor retinaculum was then released on its ulnar border.  A sect see and sewell retractor were then placed between skin and forearm fascia and the deep structures dissected free the proximal aspect of the flexor retinaculum distal forearm fascia was then released approximately 2 to 3 cm proximal to the wrist crease under direct vision.  Canal was explored.  Motor branch entered into muscle distally.  Significant deformity and erythema of the the median nerve was noted.  The wound was copiously irrigated with saline.  The skin was closed erupted 4-0 nylon sutures.  A local infiltration quarter percent bupivacaine without epinephrine was given approximately 9 cc was used.  A sterile compressive dressing with fingers free was applied.  And deflation of the tourniquet all fingers immediately pink.  She was taken to the recovery room for observation in satisfactory condition.  She will be discharged home to return to the hand center [facility] in 1 week on Tylenol and Nucynta for breakthrough and Percocet   Daryll Brod, MD Electronically signed, 07/25/21

## 2021-07-25 NOTE — Discharge Instructions (Addendum)
Hand Center Instructions Hand Surgery  Wound Care: Keep your hand elevated above the level of your heart.  Do not allow it to dangle by your side.  Keep the dressing dry and do not remove it unless your doctor advises you to do so.  He will usually change it at the time of your post-op visit.  Moving your fingers is advised to stimulate circulation but will depend on the site of your surgery.  If you have a splint applied, your doctor will advise you regarding movement.  Activity: Do not drive or operate machinery today.  Rest today and then you may return to your normal activity and work as indicated by your physician.  Diet:  Drink liquids today or eat a light diet.  You may resume a regular diet tomorrow.     Post Anesthesia Home Care Instructions  Activity: Get plenty of rest for the remainder of the day. A responsible individual must stay with you for 24 hours following the procedure.  For the next 24 hours, DO NOT: -Drive a car -Paediatric nurse -Drink alcoholic beverages -Take any medication unless instructed by your physician -Make any legal decisions or sign important papers.  Meals: Start with liquid foods such as gelatin or soup. Progress to regular foods as tolerated. Avoid greasy, spicy, heavy foods. If nausea and/or vomiting occur, drink only clear liquids until the nausea and/or vomiting subsides. Call your physician if vomiting continues.  Special Instructions/Symptoms: Your throat may feel dry or sore from the anesthesia or the breathing tube placed in your throat during surgery. If this causes discomfort, gargle with warm salt water. The discomfort should disappear within 24 hours.  If you had a scopolamine patch placed behind your ear for the management of post- operative nausea and/or vomiting:  1. The medication in the patch is effective for 72 hours, after which it should be removed.  Wrap patch in a tissue and discard in the trash. Wash hands thoroughly with  soap and water. 2. You may remove the patch earlier than 72 hours if you experience unpleasant side effects which may include dry mouth, dizziness or visual disturbances. 3. Avoid touching the patch. Wash your hands with soap and water after contact with the patch.       General expectations: Pain for two to three days. Fingers may become slightly swollen.  Call your doctor if any of the following occur: Severe pain not relieved by pain medication. Elevated temperature. Dressing soaked with blood. Inability to move fingers. White or bluish color to fingers.

## 2021-07-25 NOTE — Brief Op Note (Signed)
07/25/2021  10:22 AM  PATIENT:  [name]  81 y.o. female  PRE-OPERATIVE DIAGNOSIS:  RIGHT CARPAL TUNNEL SYNDROME  POST-OPERATIVE DIAGNOSIS:  RIGHT CARPAL TUNNEL SYNDROME  PROCEDURE:  Procedure(s) with comments: RIGHT CARPAL TUNNEL RELEASE (Right) - Bier block  SURGEON:  Surgeon(s) and Role:    * Daryll Brod, MD - Primary  PHYSICIAN ASSISTANT:   ASSISTANTS: none   ANESTHESIA:   local, regional, and IV sedation  EBL:  5 mL   BLOOD ADMINISTERED:none  DRAINS: none   LOCAL MEDICATIONS USED:  BUPIVICAINE   SPECIMEN:  No Specimen  DISPOSITION OF SPECIMEN:  N/A  COUNTS:  YES  TOURNIQUET:   Total Tourniquet Time Documented: Forearm (Right) - 23 minutes Total: Forearm (Right) - 23 minutes   DICTATION: .Viviann Spare Dictation  PLAN OF CARE: Discharge to home after PACU  PATIENT DISPOSITION:  PACU - hemodynamically stable.

## 2021-07-25 NOTE — Anesthesia Preprocedure Evaluation (Addendum)
Anesthesia Evaluation  Patient identified by MRN, date of birth, ID band Patient awake    Reviewed: Allergy & Precautions, H&P , NPO status , Patient's Chart, lab work & pertinent test results  Airway Mallampati: II  TM Distance: >3 FB Neck ROM: Full    Dental no notable dental hx. (+) Dental Advisory Given, Teeth Intact   Pulmonary shortness of breath,    Pulmonary exam normal breath sounds clear to auscultation       Cardiovascular hypertension, Pt. on medications Normal cardiovascular exam Rhythm:Regular Rate:Normal  Echocardiogram 06/20/2021:  Study Quality: Technically difficult study.  Normal LV systolic function with visual EF 60%. Left ventricle cavity is normal in size. Mild left ventricular hypertrophy. Normal global wall motion. Normal diastolic filling pattern, normal LAP. Calculated EF 60%.  No significant valvular heart disease.  No prior study for comparison.   Lexiscan Tetrofosmin stress test 06/21/2021: No previous exam available for comparison. Lexiscan nuclear stress test performed using 1-day protocol. Mild decrease in apical counts likely due to breast tissue attenuation, with imaging performed in sitting position. Otherwise, normal myocardial perfusion. Stress LVEF 55%. Low risk study.    Neuro/Psych negative neurological ROS  negative psych ROS   GI/Hepatic Neg liver ROS, GERD  Medicated,  Endo/Other  negative endocrine ROSdiabetesHypothyroidism Morbid obesity  Renal/GU negative Renal ROS     Musculoskeletal  (+) Arthritis , Osteoarthritis,    Abdominal (+) + obese,   Peds  Hematology  (+) Blood dyscrasia, anemia ,   Anesthesia Other Findings   Reproductive/Obstetrics negative OB ROS                           Anesthesia Physical  Anesthesia Plan  ASA: 3  Anesthesia Plan: Bier Block and Bier Block-LIDOCAINE ONLY   Post-op Pain Management: Minimal or no pain  anticipated, Tylenol PO (pre-op) and Celebrex PO (pre-op)   Induction:   PONV Risk Score and Plan: 2 and Ondansetron, Treatment may vary due to age or medical condition, Propofol infusion and TIVA  Airway Management Planned: Simple Face Mask and Nasal Cannula  Additional Equipment:   Intra-op Plan:   Post-operative Plan:   Informed Consent: I have reviewed the patients History and Physical, chart, labs and discussed the procedure including the risks, benefits and alternatives for the proposed anesthesia with the patient or authorized representative who has indicated his/her understanding and acceptance.     Dental advisory given  Plan Discussed with: CRNA  Anesthesia Plan Comments:        Anesthesia Quick Evaluation

## 2021-07-25 NOTE — Transfer of Care (Signed)
Immediate Anesthesia Transfer of Care Note  Patient: [name]  Procedure(s) Performed: RIGHT CARPAL TUNNEL RELEASE (Right: Hand)  Patient Location: PACU  Anesthesia Type:MAC and Bier block  Level of Consciousness: awake, alert , oriented and patient cooperative  Airway & Oxygen Therapy: Patient Spontanous Breathing and Patient connected to face mask oxygen  Post-op Assessment: Report given to RN and Post -op Vital signs reviewed and stable  Post vital signs: Reviewed and stable  Last Vitals:  Vitals Value Taken Time  BP    Temp    Pulse    Resp    SpO2      Last Pain:  Vitals:   07/25/21 0900  TempSrc: Oral  PainSc: 0-No pain      Patients Stated Pain Goal: 8 (16/07/37 1062)  Complications: No notable events documented.

## 2021-07-25 NOTE — H&P (Signed)
[name] is an 81 y.o. female.   Chief Complaint: numbness right hand HPI: [name] is an 81 yo female with right carpal tunnel syndrome. She has had a left carpal tunnel release performed. She has seen Dr. Brigitte Pulse in the past. She had nerve conductions done in 2020 revealing severe carpal tunnel syndrome right greater than left. She continues to complain of constant numbness and tingling awaken her almost every night. She has a history of thyroid problems does have arthritis family history is positive diabetes thyroid problems and arthritis negative for gout. She has been treated conservatively for this  Past Medical History:  Diagnosis Date   Arthritis    RIGHT KNEE PAIN AND OA   Chest pain 05/03/2015   Diabetes mellitus without complication (HCC)    Elevated cholesterol    GERD (gastroesophageal reflux disease)    Hypertension    Hypothyroidism    IBS (irritable bowel syndrome)    Rotator cuff disorder    BOTH SHOULDERS - NO SURGERY - AND STATES LIMITATIONS IN ARM MOVEMENT   Shortness of breath 05/03/2015   Swelling    CHRONIC SWELLING RIGHT HAND AND BOTH FEET AND BOTH LEGS   Vertigo     Past Surgical History:  Procedure Laterality Date   ABDOMINAL HYSTERECTOMY  1985   BREAST SURGERY  1985   RIGHT BREAST BIOPSY - BENIGN   CARPAL TUNNEL RELEASE Left 02/05/2019   Procedure: LEFT CARPAL TUNNEL RELEASE;  Surgeon: Daryll Brod, MD;  Location: [facility];  Service: Orthopedics;  Laterality: Left;   [place]   BILATERAL CATARACT EXTRACTIONS; SURGERY FOR MACULAR HOLE    HEMORRHOID SURGERY  1975   JOINT REPLACEMENT     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   Procedure: RIGHT TOTAL KNEE ARTHROPLASTY;  Surgeon: Gearlean Alf, MD;  Location: [facility];  Service: Orthopedics;  Laterality: Right;    Family History  Problem Relation Age of Onset   Diabetes Mother    Dementia Mother    Diabetes Father    Diabetes Sister    Breast  cancer Sister    Kidney cancer Sister    Lung cancer Sister    Lung cancer Maternal Aunt    Pancreatic cancer Maternal Aunt    Cancer Other    Social History:  reports that she has never smoked. She has never used smokeless tobacco. She reports that she does not currently use alcohol. She reports that she does not use drugs.  Allergies:  Allergies  Allergen Reactions   Codeine Nausea And Vomiting   Tramadol Nausea And Vomiting   Macrobid [Nitrofurantoin Macrocrystal] Rash    No medications prior to admission.    No results found for this or any previous visit (from the past 48 hour(s)).  No results found.   Pertinent items are noted in HPI.  Height 5\' 2"  (1.575 m), weight 97.3 kg.  General appearance: alert, cooperative, and appears stated age Head: Normocephalic, without obvious abnormality Neck: no JVD Resp: clear to auscultation bilaterally Cardio: regular rate and rhythm, S1, S2 normal, no murmur, click, rub or gallop GI: soft, non-tender; bowel sounds normal; no masses,  no organomegaly Extremities: numbness right hand Pulses: 2+ and symmetric Skin: Skin color, texture, turgor normal. No rashes or lesions Neurologic: Grossly normal Incision/Wound: na  Assessment/Plan Assessment:  1. Carpal tunnel syndrome of right wrist    Plan: She is desiring of having this surgically released. Preperi-and postoperative course  been discussed along with risk and complications. She is aware there is no guarantee to the surgery the possibility of infection recurrence injury to arteries nerves tendons complete relief symptoms and dystrophy. She is scheduled for carpal tunnel release in outpatient under regional anesthesia.  Daryll Brod 07/25/2021, 5:23 AM

## 2021-07-25 NOTE — Anesthesia Postprocedure Evaluation (Signed)
Anesthesia Post Note  Patient: [name]  Procedure(s) Performed: RIGHT CARPAL TUNNEL RELEASE (Right: Hand)     Patient location during evaluation: PACU Anesthesia Type: Bier Block Level of consciousness: awake and alert Pain management: pain level controlled Vital Signs Assessment: post-procedure vital signs reviewed and stable Respiratory status: spontaneous breathing Cardiovascular status: stable Anesthetic complications: no   No notable events documented.  Last Vitals:  Vitals:   07/25/21 1030 07/25/21 1108  BP: 125/68 (!) 120/55  Pulse: 63 66  Resp: 17 18  Temp:  (!) 36.4 C  SpO2: 100% 98%    Last Pain:  Vitals:   07/25/21 1108  TempSrc: Oral  PainSc: 0-No pain                 Nolon Nations

## 2021-07-26 ENCOUNTER — Encounter (HOSPITAL_BASED_OUTPATIENT_CLINIC_OR_DEPARTMENT_OTHER): Payer: Self-pay | Admitting: Orthopedic Surgery

## 2021-07-26 ENCOUNTER — Ambulatory Visit: Payer: Medicare Other | Admitting: Student

## 2021-08-07 ENCOUNTER — Other Ambulatory Visit: Payer: Self-pay

## 2021-08-07 MED ORDER — CRESTOR 20 MG PO TABS: ORAL_TABLET | ORAL | 2 refills | Status: DC

## 2021-08-07 NOTE — Telephone Encounter (Signed)
The patient was notified her samples of synthroid is available for pickup and brand name crestor has been sent to [facility] at the pt's request.

## 2021-08-14 ENCOUNTER — Other Ambulatory Visit: Payer: Medicare Other

## 2021-08-14 ENCOUNTER — Other Ambulatory Visit: Payer: Self-pay

## 2021-08-14 DIAGNOSIS — E039 Hypothyroidism, unspecified: Secondary | ICD-10-CM

## 2021-08-16 LAB — TSH: TSH: 1.09 u[IU]/mL (ref 0.450–4.500)

## 2021-08-28 ENCOUNTER — Other Ambulatory Visit: Payer: Self-pay | Admitting: Internal Medicine

## 2021-09-15 ENCOUNTER — Other Ambulatory Visit: Payer: Self-pay

## 2021-09-15 MED ORDER — LEVOTHYROXINE SODIUM 50 MCG PO TABS: 50.0000 ug | ORAL_TABLET | Freq: Every day | ORAL | 11 refills | Status: DC

## 2021-10-10 ENCOUNTER — Encounter: Payer: Self-pay | Admitting: Internal Medicine

## 2021-10-10 ENCOUNTER — Ambulatory Visit (INDEPENDENT_AMBULATORY_CARE_PROVIDER_SITE_OTHER): Payer: Medicare Other | Admitting: Internal Medicine

## 2021-10-10 VITALS — BP 116/80 | HR 78 | Temp 98.3°F | Ht 62.0 in | Wt 215.4 lb

## 2021-10-10 DIAGNOSIS — Z6839 Body mass index (BMI) 39.0-39.9, adult: Secondary | ICD-10-CM

## 2021-10-10 DIAGNOSIS — E1169 Type 2 diabetes mellitus with other specified complication: Secondary | ICD-10-CM | POA: Diagnosis not present

## 2021-10-10 DIAGNOSIS — E1165 Type 2 diabetes mellitus with hyperglycemia: Secondary | ICD-10-CM

## 2021-10-10 DIAGNOSIS — E785 Hyperlipidemia, unspecified: Secondary | ICD-10-CM | POA: Diagnosis not present

## 2021-10-10 DIAGNOSIS — I10 Essential (primary) hypertension: Secondary | ICD-10-CM

## 2021-10-10 DIAGNOSIS — R911 Solitary pulmonary nodule: Secondary | ICD-10-CM

## 2021-10-10 DIAGNOSIS — E7800 Pure hypercholesterolemia, unspecified: Secondary | ICD-10-CM

## 2021-10-10 DIAGNOSIS — Z0000 Encounter for general adult medical examination without abnormal findings: Secondary | ICD-10-CM | POA: Diagnosis not present

## 2021-10-10 LAB — POCT URINALYSIS DIPSTICK
Bilirubin, UA: NEGATIVE
Glucose, UA: NEGATIVE
Ketones, UA: NEGATIVE
Nitrite, UA: NEGATIVE
Protein, UA: NEGATIVE
Spec Grav, UA: 1.015 (ref 1.010–1.025)
Urobilinogen, UA: 0.2 E.U./dL
pH, UA: 6 (ref 5.0–8.0)

## 2021-10-10 NOTE — Progress Notes (Signed)
?[name],acting as a Education administrator for [name], MD.,have documented all relevant documentation on the behalf of [name], MD,as directed by  [name], MD while in the presence of [name], MD.  ?This visit occurred during the SARS-CoV-2 public health emergency.  Safety protocols were in place, including screening questions prior to the visit, additional usage of staff PPE, and extensive cleaning of exam room while observing appropriate contact time as indicated for disinfecting solutions. ? ?Subjective:  ?  ? Patient ID: [name] , female    DOB: [birth year] , 81 y.o.   MRN: [id number] ? ? ?Chief Complaint  ?Patient presents with  ? Annual Exam  ? ? ?HPI ? ?She is here today for a full physical examination.  She is no longer followed by GYN. She denies headaches, chest pain and shortness of breath. Patient reports she saw her Cardiologist in January 2023.  ? ?Hypertension ?This is a chronic problem. The current episode started more than 1 year ago. The problem has been gradually improving since onset. The problem is controlled. Pertinent negatives include no blurred vision, chest pain, headaches, palpitations or shortness of breath. Agents associated with hypertension include thyroid hormones. Risk factors for coronary artery disease include dyslipidemia, obesity, post-menopausal state and sedentary lifestyle. Past treatments include angiotensin blockers. The current treatment provides moderate improvement. Compliance problems include exercise.   ?Diabetes ?She has type 2 diabetes mellitus. Pertinent negatives for hypoglycemia include no dizziness or headaches. Pertinent negatives for diabetes include no blurred vision, no chest pain, no fatigue, no polydipsia, no polyphagia and no polyuria. She participates in exercise intermittently. An ACE inhibitor/angiotensin II receptor blocker is being taken. Eye exam is current.   ? ?Past Medical History:  ?Diagnosis Date  ? Arthritis    ? RIGHT KNEE PAIN AND OA  ? Chest pain 05/03/2015  ? Diabetes mellitus without complication ([place])   ? Elevated cholesterol   ? GERD (gastroesophageal reflux disease)   ? Hypertension   ? Hypothyroidism   ? IBS (irritable bowel syndrome)   ? Rotator cuff disorder   ? BOTH SHOULDERS - NO SURGERY - AND STATES LIMITATIONS IN ARM MOVEMENT  ? Shortness of breath 05/03/2015  ? Swelling   ? CHRONIC SWELLING RIGHT HAND AND BOTH FEET AND BOTH LEGS  ? Vertigo   ?  ? ?Family History  ?Problem Relation Age of Onset  ? Diabetes Mother   ? Dementia Mother   ? Diabetes Father   ? Diabetes Sister   ? Breast cancer Sister   ? Kidney cancer Sister   ? Lung cancer Sister   ? Lung cancer Maternal Aunt   ? Pancreatic cancer Maternal Aunt   ? Cancer Other   ? ? ? ?Current Outpatient Medications:  ?  albuterol (VENTOLIN HFA) 108 (90 Base) MCG/ACT inhaler, Inhale 1 puff into the lungs as needed., Disp: , Rfl:  ?  Aspirin (ECOTRIN PO), Take 81 mg by mouth daily. , Disp: , Rfl:  ?  BENICAR 20 MG tablet, TAKE 1 TABLET BY MOUTH  DAILY, Disp: 90 tablet, Rfl: 3 ?  bimatoprost (LUMIGAN) 0.01 % SOLN, Place 1 drop into both eyes at bedtime. , Disp: , Rfl:  ?  Blood Glucose Monitoring Suppl (BLOOD GLUCOSE MONITOR SYSTEM) w/Device KIT, Use to check blood sugar 3 times a day. Dx code e11.65, Disp: 3 kit, Rfl: 3 ?  CRESTOR 20 MG tablet, Take 1 tablet by mouth at bedtime, Disp: 90 tablet,  Rfl: 2 ?  famotidine (PEPCID) 20 MG tablet, Take by mouth., Disp: , Rfl:  ?  furosemide (LASIX) 20 MG tablet, TAKE 1 TABLET BY MOUTH IN  THE MORNING, Disp: 90 tablet, Rfl: 3 ?  glucose blood (ONETOUCH VERIO) test strip, Use as instructed to check blood sugars 3 times daily E11.69, Disp: 100 each, Rfl: 12 ?  Lancets (ONETOUCH DELICA PLUS EQASTM19Q) MISC, USE ONE LANCET 3 TIMES  DAILY BEFORE MEALS, Disp: 300 each, Rfl: 3 ?  levothyroxine (SYNTHROID) 50 MCG tablet, Take 1 tablet (50 mcg total) by mouth daily., Disp: 30 tablet, Rfl: 11 ?  Lifitegrast (XIIDRA) 5 % SOLN,  Apply to eye. xiidra, Disp: , Rfl:  ?  MAGNESIUM-OXIDE 400 (240 Mg) MG tablet, TAKE 1 TABLET BY MOUTH EVERY DAY AFTER SUPPER, Disp: 90 tablet, Rfl: 1 ?  polyethylene glycol (MIRALAX / GLYCOLAX) packet, Take 17 g by mouth daily., Disp: , Rfl:  ?  vitamin B-6 (PYRIDOXINE) 25 MG tablet, Take 25 mg by mouth daily., Disp: , Rfl:   ? ?Allergies  ?Allergen Reactions  ? Codeine Nausea And Vomiting  ? Tramadol Nausea And Vomiting  ? Macrobid [Nitrofurantoin Macrocrystal] Rash  ?  ? ? ?The patient states she uses post menopausal status for birth control. Last LMP was No LMP recorded. Patient has had a hysterectomy.. Negative for Dysmenorrhea. Negative for: breast discharge, breast lump(s), breast pain and breast self exam. Associated symptoms include abnormal vaginal bleeding. Pertinent negatives include abnormal bleeding (hematology), anxiety, decreased libido, depression, difficulty falling sleep, dyspareunia, history of infertility, nocturia, sexual dysfunction, sleep disturbances, urinary incontinence, urinary urgency, vaginal discharge and vaginal itching. Diet regular.The patient states her exercise level is  intermittent. ? . The patient's tobacco use is:  ?Social History  ? ?Tobacco Use  ?Smoking Status Never  ?Smokeless Tobacco Never  ?Marland Kitchen She has been exposed to passive smoke. The patient's alcohol use is:  ?Social History  ? ?Substance and Sexual Activity  ?Alcohol Use Not Currently  ? Comment: OCCAS ALCOHOL  ? ?Review of Systems  ?Constitutional: Negative.  Negative for fatigue.  ?HENT: Negative.    ?Eyes: Negative.  Negative for blurred vision.  ?Respiratory: Negative.  Negative for shortness of breath.   ?Cardiovascular: Negative.  Negative for chest pain and palpitations.  ?Gastrointestinal: Negative.   ?Endocrine: Negative.  Negative for polydipsia, polyphagia and polyuria.  ?Genitourinary: Negative.   ?Musculoskeletal: Negative.   ?Skin: Negative.   ?Allergic/Immunologic: Negative.   ?Neurological: Negative.   Negative for dizziness and headaches.  ?Hematological: Negative.   ?Psychiatric/Behavioral: Negative.     ? ?Today's Vitals  ? 10/10/21 1112  ?BP: 116/80  ?Pulse: 78  ?Temp: 98.3 ?F (36.8 ?C)  ?Weight: 215 lb 6.4 oz (97.7 kg)  ?Height: $RemoveB'5\' 2"'czbCFsRh$  (1.575 m)  ?PainSc: 0-No pain  ? ?Body mass index is 39.4 kg/m?.  ?Wt Readings from Last 3 Encounters:  ?10/10/21 215 lb 6.4 oz (97.7 kg)  ?07/25/21 215 lb 2.7 oz (97.6 kg)  ?07/24/21 217 lb 3.2 oz (98.5 kg)  ?  ? ?Objective:  ?Physical Exam ?Vitals and nursing note reviewed.  ?Constitutional:   ?   Appearance: Normal appearance.  ?HENT:  ?   Head: Normocephalic and atraumatic.  ?   Right Ear: Tympanic membrane, ear canal and external ear normal.  ?   Left Ear: Tympanic membrane, ear canal and external ear normal.  ?   Nose:  ?   Comments: Masked  ?   Mouth/Throat:  ?   Comments:  Masked  ?Eyes:  ?   Extraocular Movements: Extraocular movements intact.  ?   Conjunctiva/sclera: Conjunctivae normal.  ?   Pupils: Pupils are equal, round, and reactive to light.  ?Cardiovascular:  ?   Rate and Rhythm: Normal rate and regular rhythm.  ?   Pulses: Normal pulses.     ?     Dorsalis pedis pulses are 2+ on the right side and 2+ on the left side.  ?   Heart sounds: Normal heart sounds.  ?Pulmonary:  ?   Effort: Pulmonary effort is normal.  ?   Breath sounds: Normal breath sounds.  ?Chest:  ?Breasts: ?   Tanner Score is 5.  ?   Right: Normal.  ?   Left: Normal.  ?Abdominal:  ?   General: Bowel sounds are normal.  ?   Palpations: Abdomen is soft.  ?   Comments: ROUNDED, SOFT.   ?Genitourinary: ?   Comments: deferred ?Musculoskeletal:     ?   General: Normal range of motion.  ?   Cervical back: Normal range of motion and neck supple.  ?Feet:  ?   Right foot:  ?   Protective Sensation: 5 sites tested.  5 sites sensed.  ?   Skin integrity: Skin integrity normal.  ?   Toenail Condition: Right toenails are normal.  ?   Left foot:  ?   Protective Sensation: 5 sites tested.  5 sites sensed.  ?    Skin integrity: Skin integrity normal.  ?   Toenail Condition: Left toenails are normal.  ?Skin: ?   General: Skin is warm and dry.  ?Neurological:  ?   General: No focal deficit present.  ?   Mental Status:

## 2021-10-10 NOTE — Patient Instructions (Addendum)
Coconut water ?Pedialyte for electrolytes ?Vionic shoes ? ?Health Maintenance, Female ?Adopting a healthy lifestyle and getting preventive care are important in promoting health and wellness. Ask your health care provider about: ?The right schedule for you to have regular tests and exams. ?Things you can do on your own to prevent diseases and keep yourself healthy. ?What should I know about diet, weight, and exercise? ?Eat a healthy diet ? ?Eat a diet that includes plenty of vegetables, fruits, low-fat dairy products, and lean protein. ?Do not eat a lot of foods that are high in solid fats, added sugars, or sodium. ?Maintain a healthy weight ?Body mass index (BMI) is used to identify weight problems. It estimates body fat based on height and weight. Your health care provider can help determine your BMI and help you achieve or maintain a healthy weight. ?Get regular exercise ?Get regular exercise. This is one of the most important things you can do for your health. Most adults should: ?Exercise for at least 150 minutes each week. The exercise should increase your heart rate and make you sweat (moderate-intensity exercise). ?Do strengthening exercises at least twice a week. This is in addition to the moderate-intensity exercise. ?Spend less time sitting. Even light physical activity can be beneficial. ?Watch cholesterol and blood lipids ?Have your blood tested for lipids and cholesterol at 83 years of age, then have this test every 5 years. ?Have your cholesterol levels checked more often if: ?Your lipid or cholesterol levels are high. ?You are older than 83 years of age. ?You are at high risk for heart disease. ?What should I know about cancer screening? ?Depending on your health history and family history, you may need to have cancer screening at various ages. This may include screening for: ?Breast cancer. ?Cervical cancer. ?Colorectal cancer. ?Skin cancer. ?Lung cancer. ?What should I know about heart disease,  diabetes, and high blood pressure? ?Blood pressure and heart disease ?High blood pressure causes heart disease and increases the risk of stroke. This is more likely to develop in people who have high blood pressure readings or are overweight. ?Have your blood pressure checked: ?Every 3-5 years if you are 38-83 years of age. ?Every year if you are 16 years old or older. ?Diabetes ?Have regular diabetes screenings. This checks your fasting blood sugar level. Have the screening done: ?Once every three years after age 49 if you are at a normal weight and have a low risk for diabetes. ?More often and at a younger age if you are overweight or have a high risk for diabetes. ?What should I know about preventing infection? ?Hepatitis B ?If you have a higher risk for hepatitis B, you should be screened for this virus. Talk with your health care provider to find out if you are at risk for hepatitis B infection. ?Hepatitis C ?Testing is recommended for: ?Everyone born from [birth year] through 1967. ?Anyone with known risk factors for hepatitis C. ?Sexually transmitted infections (STIs) ?Get screened for STIs, including gonorrhea and chlamydia, if: ?You are sexually active and are younger than 83 years of age. ?You are older than 83 years of age and your health care provider tells you that you are at risk for this type of infection. ?Your sexual activity has changed since you were last screened, and you are at increased risk for chlamydia or gonorrhea. Ask your health care provider if you are at risk. ?Ask your health care provider about whether you are at high risk for HIV. Your health care provider may  recommend a prescription medicine to help prevent HIV infection. If you choose to take medicine to prevent HIV, you should first get tested for HIV. You should then be tested every 3 months for as long as you are taking the medicine. ?Pregnancy ?If you are about to stop having your period (premenopausal) and you may become pregnant,  seek counseling before you get pregnant. ?Take 400 to 800 micrograms (mcg) of folic acid every day if you become pregnant. ?Ask for birth control (contraception) if you want to prevent pregnancy. ?Osteoporosis and menopause ?Osteoporosis is a disease in which the bones lose minerals and strength with aging. This can result in bone fractures. If you are 55 years old or older, or if you are at risk for osteoporosis and fractures, ask your health care provider if you should: ?Be screened for bone loss. ?Take a calcium or vitamin D supplement to lower your risk of fractures. ?Be given hormone replacement therapy (HRT) to treat symptoms of menopause. ?Follow these instructions at home: ?Alcohol use ?Do not drink alcohol if: ?Your health care provider tells you not to drink. ?You are pregnant, may be pregnant, or are planning to become pregnant. ?If you drink alcohol: ?Limit how much you have to: ?0-1 drink a day. ?Know how much alcohol is in your drink. In the [place] one drink equals one 12 oz bottle of beer (355 mL), one 5 oz glass of wine (148 mL), or one 1? oz glass of hard liquor (44 mL). ?Lifestyle ?Do not use any products that contain nicotine or tobacco. These products include cigarettes, chewing tobacco, and vaping devices, such as e-cigarettes. If you need help quitting, ask your health care provider. ?Do not use street drugs. ?Do not share needles. ?Ask your health care provider for help if you need support or information about quitting drugs. ?General instructions ?Schedule regular health, dental, and eye exams. ?Stay current with your vaccines. ?Tell your health care provider if: ?You often feel depressed. ?You have ever been abused or do not feel safe at home. ?Summary ?Adopting a healthy lifestyle and getting preventive care are important in promoting health and wellness. ?Follow your health care provider's instructions about healthy diet, exercising, and getting tested or screened for diseases. ?Follow your  health care provider's instructions on monitoring your cholesterol and blood pressure. ?This information is not intended to replace advice given to you by your health care provider. Make sure you discuss any questions you have with your health care provider. ?Document Revised: 11/14/2020 Document Reviewed: 11/14/2020 ?Elsevier Patient Education ? [place]. ? ?

## 2021-10-12 ENCOUNTER — Telehealth: Payer: Self-pay

## 2021-10-12 NOTE — Chronic Care Management (AMB) (Signed)
? ? ?  Called Meredith Pel, No answer, left message of appointment on 10-17-2021 at 2:00 via telephone visit with [name], Pharm D. Notified to have all medications, supplements, blood pressure and/or blood sugar logs available during appointment and to return call if need to reschedule. ? ? ?Malecca Hicks CMA ?Clinical Pharmacist Assistant ?[phone] ? ? ?

## 2021-10-13 LAB — CMP14+EGFR
ALT: 39 IU/L — ABNORMAL HIGH (ref 0–32)
AST: 25 IU/L (ref 0–40)
Albumin/Globulin Ratio: 2 (ref 1.2–2.2)
Albumin: 4.2 g/dL (ref 3.6–4.6)
Alkaline Phosphatase: 120 IU/L (ref 44–121)
BUN/Creatinine Ratio: 19 (ref 12–28)
BUN: 13 mg/dL (ref 8–27)
Bilirubin Total: 0.6 mg/dL (ref 0.0–1.2)
CO2: 23 mmol/L (ref 20–29)
Calcium: 10.7 mg/dL — ABNORMAL HIGH (ref 8.7–10.3)
Chloride: 107 mmol/L — ABNORMAL HIGH (ref 96–106)
Creatinine, Ser: 0.7 mg/dL (ref 0.57–1.00)
Globulin, Total: 2.1 g/dL (ref 1.5–4.5)
Glucose: 98 mg/dL (ref 70–99)
Potassium: 4.3 mmol/L (ref 3.5–5.2)
Sodium: 145 mmol/L — ABNORMAL HIGH (ref 134–144)
Total Protein: 6.3 g/dL (ref 6.0–8.5)
eGFR: 86 mL/min/{1.73_m2} (ref >59)

## 2021-10-13 LAB — CBC
Hematocrit: 47.6 % — ABNORMAL HIGH (ref 34.0–46.6)
Hemoglobin: 16.1 g/dL — ABNORMAL HIGH (ref 11.1–15.9)
MCH: 30.7 pg (ref 26.6–33.0)
MCHC: 33.8 g/dL (ref 31.5–35.7)
MCV: 91 fL (ref 79–97)
Platelets: 159 10*3/uL (ref 150–450)
RBC: 5.25 x10E6/uL (ref 3.77–5.28)
RDW: 13.5 % (ref 11.7–15.4)
WBC: 4.2 10*3/uL (ref 3.4–10.8)

## 2021-10-13 LAB — PTH, INTACT AND CALCIUM: PTH: 81 pg/mL — ABNORMAL HIGH (ref 15–65)

## 2021-10-13 LAB — MICROALBUMIN / CREATININE URINE RATIO
Creatinine, Urine: 47.1 mg/dL
Microalb/Creat Ratio: 20 mg/g creat (ref 0–29)
Microalbumin, Urine: 9.2 ug/mL

## 2021-10-13 LAB — HEMOGLOBIN A1C
Est. average glucose Bld gHb Est-mCnc: 151 mg/dL
Hgb A1c MFr Bld: 6.9 % — ABNORMAL HIGH (ref 4.8–5.6)

## 2021-10-13 LAB — CALCIUM, IONIZED: Calcium, Ion: 5.8 mg/dL — ABNORMAL HIGH (ref 4.5–5.6)

## 2021-10-17 ENCOUNTER — Telehealth: Payer: Medicare Other

## 2021-10-23 ENCOUNTER — Telehealth: Payer: Self-pay

## 2021-10-23 NOTE — Chronic Care Management (AMB) (Addendum)
? ? ?  Chronic Care Management ?Pharmacy Assistant  ? ?Name: [name]  MRN: [id number] DOB: 1940/09/07 ? ?Reason for Encounter: Reschedule missed appointment ?  ? ?Medications: ?Outpatient Encounter Medications as of 10/23/2021  ?Medication Sig  ? albuterol (VENTOLIN HFA) 108 (90 Base) MCG/ACT inhaler Inhale 1 puff into the lungs as needed.  ? Aspirin (ECOTRIN PO) Take 81 mg by mouth daily.   ? BENICAR 20 MG tablet TAKE 1 TABLET BY MOUTH  DAILY  ? bimatoprost (LUMIGAN) 0.01 % SOLN Place 1 drop into both eyes at bedtime.   ? Blood Glucose Monitoring Suppl (BLOOD GLUCOSE MONITOR SYSTEM) w/Device KIT Use to check blood sugar 3 times a day. Dx code e11.65  ? CRESTOR 20 MG tablet Take 1 tablet by mouth at bedtime  ? famotidine (PEPCID) 20 MG tablet Take by mouth.  ? furosemide (LASIX) 20 MG tablet TAKE 1 TABLET BY MOUTH IN  THE MORNING  ? glucose blood (ONETOUCH VERIO) test strip Use as instructed to check blood sugars 3 times daily E11.69  ? Lancets (ONETOUCH DELICA PLUS [id number]) MISC USE ONE LANCET 3 TIMES  DAILY BEFORE MEALS  ? levothyroxine (SYNTHROID) 50 MCG tablet Take 1 tablet (50 mcg total) by mouth daily.  ? Lifitegrast (XIIDRA) 5 % SOLN Apply to eye. xiidra  ? MAGNESIUM-OXIDE 400 (240 Mg) MG tablet TAKE 1 TABLET BY MOUTH EVERY DAY AFTER SUPPER  ? polyethylene glycol (MIRALAX / GLYCOLAX) packet Take 17 g by mouth daily.  ? vitamin B-6 (PYRIDOXINE) 25 MG tablet Take 25 mg by mouth daily.  ? ?No facility-administered encounter medications on file as of 10/23/2021.  ? ?10-23-2021: Left patient a voicemail on both lines to reschedule missed appointment with [name]. ? ?10-25-2021:  Left patient a voicemail on both lines to reschedule missed appointment with [name]. ? ?Jeannette How CMA ?Clinical Pharmacist Assistant ?[phone] ? ?

## 2021-10-28 DIAGNOSIS — E1169 Type 2 diabetes mellitus with other specified complication: Secondary | ICD-10-CM | POA: Insufficient documentation

## 2021-10-28 DIAGNOSIS — R911 Solitary pulmonary nodule: Secondary | ICD-10-CM | POA: Insufficient documentation

## 2021-10-28 DIAGNOSIS — R918 Other nonspecific abnormal finding of lung field: Secondary | ICD-10-CM | POA: Insufficient documentation

## 2021-10-29 ENCOUNTER — Other Ambulatory Visit: Payer: Self-pay | Admitting: Internal Medicine

## 2021-11-02 ENCOUNTER — Other Ambulatory Visit: Payer: Self-pay | Admitting: Internal Medicine

## 2021-11-06 ENCOUNTER — Other Ambulatory Visit: Payer: Self-pay | Admitting: Internal Medicine

## 2021-11-29 ENCOUNTER — Ambulatory Visit
Admission: RE | Admit: 2021-11-29 | Discharge: 2021-11-29 | Disposition: A | Discharge status: Home / Self Care | Payer: Medicare Other | Source: Ambulatory Visit | Attending: Internal Medicine | Admitting: Internal Medicine

## 2021-11-29 DIAGNOSIS — R911 Solitary pulmonary nodule: Secondary | ICD-10-CM

## 2021-12-14 ENCOUNTER — Ambulatory Visit (INDEPENDENT_AMBULATORY_CARE_PROVIDER_SITE_OTHER): Payer: Medicare Other | Admitting: Internal Medicine

## 2021-12-14 ENCOUNTER — Encounter: Payer: Self-pay | Admitting: Internal Medicine

## 2021-12-14 VITALS — BP 122/76 | HR 72 | Temp 98.2°F | Ht 62.0 in | Wt 214.0 lb

## 2021-12-14 DIAGNOSIS — E213 Hyperparathyroidism, unspecified: Secondary | ICD-10-CM

## 2021-12-14 DIAGNOSIS — Z79899 Other long term (current) drug therapy: Secondary | ICD-10-CM

## 2021-12-14 DIAGNOSIS — R5383 Other fatigue: Secondary | ICD-10-CM

## 2021-12-14 DIAGNOSIS — E785 Hyperlipidemia, unspecified: Secondary | ICD-10-CM | POA: Diagnosis not present

## 2021-12-14 DIAGNOSIS — Z6839 Body mass index (BMI) 39.0-39.9, adult: Secondary | ICD-10-CM

## 2021-12-14 DIAGNOSIS — E1169 Type 2 diabetes mellitus with other specified complication: Secondary | ICD-10-CM

## 2021-12-14 NOTE — Progress Notes (Unsigned)
Jeri Cos [place]leton,acting as a Neurosurgeon for Stephanie Aliment, MD.,have documented all relevant documentation on the behalf of Stephanie Aliment, MD,as directed by  Stephanie Aliment, MD while in the presence of Stephanie Aliment, MD.  This visit occurred during the SARS-CoV-2 public health emergency.  Safety protocols were in place, including screening questions prior to the visit, additional usage of staff PPE, and extensive cleaning of exam room while observing appropriate contact time as indicated for disinfecting solutions.  Subjective:     Patient ID: [name] , female    DOB: [birth year] , 81 y.o.   MRN: [id number]   Chief Complaint  Patient presents with   Diabetes    HPI  She presents today for BP and DM f/u. She reports compliance with meds. Denies headaches, chest pain and palpitations. Patient stated the ozempic is causing her to have fatigue.  Diabetes She presents for her follow-up diabetic visit. She has type 2 diabetes mellitus. Her disease course has been stable. There are no hypoglycemic associated symptoms. There are no diabetic associated symptoms. Pertinent negatives for diabetes include no blurred vision, no chest pain, no polydipsia, no polyphagia and no polyuria. There are no hypoglycemic complications. Risk factors for coronary artery disease include diabetes mellitus, dyslipidemia, hypertension, obesity, post-menopausal and sedentary lifestyle. She participates in exercise intermittently. Eye exam is current.  Hypertension This is a chronic problem. The current episode started more than 1 year ago. The problem has been gradually improving since onset. The problem is controlled. Pertinent negatives include no blurred vision, chest pain, palpitations or shortness of breath. Agents associated with hypertension include thyroid hormones. Risk factors for coronary artery disease include dyslipidemia, obesity, post-menopausal state and sedentary lifestyle. Past treatments include  angiotensin blockers. The current treatment provides moderate improvement. Compliance problems include exercise.      Past Medical History:  Diagnosis Date   Arthritis    RIGHT KNEE PAIN AND OA   Chest pain 05/03/2015   Diabetes mellitus without complication (HCC)    Elevated cholesterol    GERD (gastroesophageal reflux disease)    Hypertension    Hypothyroidism    IBS (irritable bowel syndrome)    Rotator cuff disorder    BOTH SHOULDERS - NO SURGERY - AND STATES LIMITATIONS IN ARM MOVEMENT   Shortness of breath 05/03/2015   Swelling    CHRONIC SWELLING RIGHT HAND AND BOTH FEET AND BOTH LEGS   Vertigo      Family History  Problem Relation Age of Onset   Diabetes Mother    Dementia Mother    Diabetes Father    Diabetes Sister    Breast cancer Sister    Kidney cancer Sister    Lung cancer Sister    Lung cancer Maternal Aunt    Pancreatic cancer Maternal Aunt    Cancer Other      Current Outpatient Medications:    albuterol (VENTOLIN HFA) 108 (90 Base) MCG/ACT inhaler, Inhale 1 puff into the lungs as needed., Disp: , Rfl:    Aspirin (ECOTRIN PO), Take 81 mg by mouth daily. , Disp: , Rfl:    BENICAR 20 MG tablet, TAKE 1 TABLET BY MOUTH  DAILY, Disp: 90 tablet, Rfl: 3   bimatoprost (LUMIGAN) 0.01 % SOLN, Place 1 drop into both eyes at bedtime. , Disp: , Rfl:    Blood Glucose Monitoring Suppl (ONETOUCH VERIO FLEX SYSTEM) w/Device KIT, USE TO CHECK BLOOD SUGAR 3  TIMES DAILY, Disp: 1 kit, Rfl: 1  CRESTOR 20 MG tablet, Take 1 tablet by mouth at bedtime, Disp: 90 tablet, Rfl: 2   famotidine (PEPCID) 20 MG tablet, Take by mouth., Disp: , Rfl:    furosemide (LASIX) 20 MG tablet, TAKE 1 TABLET BY MOUTH IN  THE MORNING, Disp: 90 tablet, Rfl: 3   glucose blood (ONETOUCH VERIO) test strip, Use as instructed to check blood sugars 3 times daily E11.69, Disp: 100 each, Rfl: 12   Lancets (ONETOUCH DELICA PLUS [id number]) MISC, USE ONE LANCET 3 TIMES  DAILY BEFORE MEALS, Disp: 300 each,  Rfl: 3   levothyroxine (SYNTHROID) 50 MCG tablet, Take 1 tablet (50 mcg total) by mouth daily., Disp: 30 tablet, Rfl: 11   Lifitegrast (XIIDRA) 5 % SOLN, Apply to eye. xiidra, Disp: , Rfl:    MAGNESIUM-OXIDE 400 (240 Mg) MG tablet, TAKE 1 TABLET BY MOUTH EVERY DAY AFTER SUPPER, Disp: 90 tablet, Rfl: 1   polyethylene glycol (MIRALAX / GLYCOLAX) packet, Take 17 g by mouth daily., Disp: , Rfl:    vitamin B-6 (PYRIDOXINE) 25 MG tablet, Take 25 mg by mouth daily., Disp: , Rfl:    Allergies  Allergen Reactions   Codeine Nausea And Vomiting   Tramadol Nausea And Vomiting   Macrobid [Nitrofurantoin Macrocrystal] Rash     Review of Systems  Constitutional: Negative.   Eyes: Negative.  Negative for blurred vision.  Respiratory: Negative.  Negative for shortness of breath.   Cardiovascular: Negative.  Negative for chest pain and palpitations.  Gastrointestinal: Negative.   Endocrine: Negative for polydipsia, polyphagia and polyuria.  Musculoskeletal: Negative.   Skin: Negative.   Neurological: Negative.   Psychiatric/Behavioral: Negative.       Today's Vitals   12/14/21 1135  BP: 122/76  Pulse: 72  Temp: 98.2 F (36.8 C)  Weight: 214 lb (97.1 kg)  Height: $Remove'5\' 2"'jTsaaWW$  (1.575 m)  PainSc: 0-No pain   Body mass index is 39.14 kg/m.  Wt Readings from Last 3 Encounters:  12/14/21 214 lb (97.1 kg)  10/10/21 215 lb 6.4 oz (97.7 kg)  07/25/21 215 lb 2.7 oz (97.6 kg)     Objective:  Physical Exam Vitals and nursing note reviewed.  Constitutional:      Appearance: Normal appearance.  HENT:     Head: Normocephalic and atraumatic.  Eyes:     Extraocular Movements: Extraocular movements intact.  Cardiovascular:     Rate and Rhythm: Normal rate and regular rhythm.     Heart sounds: Normal heart sounds.  Pulmonary:     Effort: Pulmonary effort is normal.     Breath sounds: Normal breath sounds.  Musculoskeletal:     Cervical back: Normal range of motion.  Skin:    General: Skin is warm.   Neurological:     General: No focal deficit present.     Mental Status: She is alert.  Psychiatric:        Mood and Affect: Mood normal.        Behavior: Behavior normal.      Assessment And Plan:     1. Dyslipidemia associated with type 2 diabetes mellitus (HCC)  2. Class 2 severe obesity due to excess calories with serious comorbidity and body mass index (BMI) of 39.0 to 39.9 in adult [facility])   Patient was given opportunity to ask questions. Patient verbalized understanding of the plan and was able to repeat key elements of the plan. All questions were answered to their satisfaction.   I, [name], MD, have reviewed all  documentation for this visit. The documentation on 12/14/21 for the exam, diagnosis, procedures, and orders are all accurate and complete.   IF YOU HAVE BEEN REFERRED TO A SPECIALIST, IT MAY TAKE 1-2 WEEKS TO SCHEDULE/PROCESS THE REFERRAL. IF YOU HAVE NOT HEARD FROM US/SPECIALIST IN TWO WEEKS, PLEASE GIVE [place] A CALL AT [phone].   THE PATIENT IS ENCOURAGED TO PRACTICE SOCIAL DISTANCING DUE TO THE COVID-19 PANDEMIC.

## 2021-12-14 NOTE — Patient Instructions (Addendum)
Hyperparathyroidism  Hyperparathyroidism is a condition that is caused by one or more overactive parathyroid glands. There are four parathyroid glands in the front of your neck. They are the size of a pea. The parathyroid glands are on your thyroid gland, or near it. These glands produce a chemical called parathyroid hormone, or PTH. Parathyroid glands release PTH when your blood calcium level is low to ensure a constant supply of calcium. Calcium is needed to make your bones, heart, and muscles healthy. There are two types of hyperparathyroidism: Primary hyperparathyroidism. Secondary hyperparathyroidism. What are the causes? The cause of this condition depends on the type of hyperparathyroidism that you have. Primary hyperparathyroidism is caused by: A benign tumor in one or more of the parathyroid glands (parathyroid adenoma). The adenoma releases too much PTH. This causes high levels of calcium in the blood and may lead to symptoms. PTH raises calcium levels by: Causing your bones to release calcium. Increasing the amount of calcium absorbed from the foods you eat. Making your kidneys retain too much calcium. Genes. In rare cases, primary hyperparathyroidism can be passed down through families. Secondary hyperparathyroidism is usually caused by conditions that cause low calcium. Low calcium triggers the parathyroid glands to make and release more PTH. The glands may become overactive (hyperplasia) and release too much PTH. These conditions are: Kidney failure. When kidneys fail, they are unable to make vitamin D or remove phosphorus, which leads to low calcium levels. Vitamin D deficiency. What increases the risk? You are more likely to develop this condition if: You are a woman. This condition is more common in women than in men. You are 50 years or older. You have chronic kidney disease. What are the signs or symptoms? Mild hyperparathyroidism may not cause any symptoms. If you start to  have symptoms, they may include: Muscle weakness. Aches and pains. Fatigue. Depression. Difficulty thinking. Bone pain or bone fracture from thinning bones. This is from too much calcium in the body. Back or groin pain from kidney stones. This happens if your kidneys retain too much calcium. Symptoms of more severe disease may include: Nausea and vomiting. Increased thirst. Constipation. Urinating more often than normal. Confusion. How is this diagnosed? Your health care provider may suspect hyperparathyroidism if the results of a routine blood test show a high level of calcium. In this case, your health care provider may do another blood test to check your PTH level. You may also have other tests to find out the cause of your condition and check for evidence of bone loss or kidney stones. These tests may include: A 24-hour urine collection to find out if you have primary or secondary hyperparathyroidism. A bone scan test to check bone density (DXA scan). Imaging tests of your kidney, such as X-ray, ultrasound, or CT scan. Blood tests for vitamin D and phosphorus. An ultrasound of your parathyroid glands. How is this treated? Mild cases of hyperparathyroidism may not need to be treated. If you are not treated, you will need to have regular blood tests to make sure your calcium and PTH levels are not getting too high. Surgery is the usual treatment if you have primary hyperparathyroidism. Surgery will usually cure the condition. Surgery may be recommended if: You have high calcium. You have low bone density. You have kidney stones. Weak bones cause a fracture. You are younger than 15. Secondary hyperparathyroidism is managed by treating the underlying disorder. For chronic kidney disease: Reducing phosphorus in the diet or taking phosphorus binders for  high levels of phosphorus. A medicine that acts on the parathyroid glands to lower PTH (cinacalcet). Dialysis or kidney  transplant. For vitamin D deficiency, you may be given vitamin D supplements. Follow these instructions at home: Eating and drinking  Follow instructions from your health care provider about eating or drinking. Take supplements only as told by your health care provider. Drink enough fluid to keep your urine pale yellow. General instructions Take over-the-counter and prescription medicines only as told by your health care provider. Exercise as told by your health care provider. Weight-bearing exercise is good for bone health. Ask your health care provider what exercises are safe for you. If you had parathyroid surgery, follow your health care provider's instructions for taking care of yourself at home after surgery. Resume your usual activities as told by your health care provider. Ask your health care provider what activities are safe for you. Keep all follow-up visits. This is important. Where to find more information [employer] of Diabetes and Digestive and Kidney Diseases: [employer] National Kidney Foundation: www.kidney.org Contact a health care provider if: You develop any symptoms of this condition. Your symptoms get worse. Summary Hyperparathyroidism is a condition caused by one or more overactive parathyroid glands. These four pea-size glands secrete a certain chemical messenger (parathyroid hormone) when your blood calcium level is low to ensure a constant supply of calcium in your body. Too much parathyroid hormone causes high levels of blood calcium and may lead to symptoms of hyperparathyroidism. There are two types of hyperparathyroidism: primary and secondary. If this condition is mild, you may not have any symptoms. Hyperparathyroidism is often diagnosed when a routine blood test finds high levels of calcium. Surgery is the best treatment for primary disease with symptoms. If surgery cannot be done, medicines may be of help for people who have mild secondary  disease or primary disease. This information is not intended to replace advice given to you by your health care provider. Make sure you discuss any questions you have with your health care provider. Document Revised: 07/07/2021 Document Reviewed: 06/27/2020 Elsevier Patient Education  [place].   Diabetes Mellitus and Nutrition, Adult When you have diabetes, or diabetes mellitus, it is very important to have healthy eating habits because your blood sugar (glucose) levels are greatly affected by what you eat and drink. Eating healthy foods in the right amounts, at about the same times every day, can help you: Manage your blood glucose. Lower your risk of heart disease. Improve your blood pressure. Reach or maintain a healthy weight. What can affect my meal plan? Every person with diabetes is different, and each person has different needs for a meal plan. Your health care provider may recommend that you work with a dietitian to make a meal plan that is best for you. Your meal plan may vary depending on factors such as: The calories you need. The medicines you take. Your weight. Your blood glucose, blood pressure, and cholesterol levels. Your activity level. Other health conditions you have, such as heart or kidney disease. How do carbohydrates affect me? Carbohydrates, also called carbs, affect your blood glucose level more than any other type of food. Eating carbs raises the amount of glucose in your blood. It is important to know how many carbs you can safely have in each meal. This is different for every person. Your dietitian can help you calculate how many carbs you should have at each meal and for each snack. How does alcohol affect me? Alcohol  can cause a decrease in blood glucose (hypoglycemia), especially if you use insulin or take certain diabetes medicines by mouth. Hypoglycemia can be a life-threatening condition. Symptoms of hypoglycemia, such as sleepiness, dizziness,  and confusion, are similar to symptoms of having too much alcohol. Do not drink alcohol if: Your health care provider tells you not to drink. You are pregnant, may be pregnant, or are planning to become pregnant. If you drink alcohol: Limit how much you have to: 0-1 drink a day for women. 0-2 drinks a day for men. Know how much alcohol is in your drink. In the [place] one drink equals one 12 oz bottle of beer (355 mL), one 5 oz glass of wine (148 mL), or one 1 oz glass of hard liquor (44 mL). Keep yourself hydrated with water, diet soda, or unsweetened iced tea. Keep in mind that regular soda, juice, and other mixers may contain a lot of sugar and must be counted as carbs. What are tips for following this plan?  Reading food labels Start by checking the serving size on the Nutrition Facts label of packaged foods and drinks. The number of calories and the amount of carbs, fats, and other nutrients listed on the label are based on one serving of the item. Many items contain more than one serving per package. Check the total grams (g) of carbs in one serving. Check the number of grams of saturated fats and trans fats in one serving. Choose foods that have a low amount or none of these fats. Check the number of milligrams (mg) of salt (sodium) in one serving. Most people should limit total sodium intake to less than 2,300 mg per day. Always check the nutrition information of foods labeled as "low-fat" or "nonfat." These foods may be higher in added sugar or refined carbs and should be avoided. Talk to your dietitian to identify your daily goals for nutrients listed on the label. Shopping Avoid buying canned, pre-made, or processed foods. These foods tend to be high in fat, sodium, and added sugar. Shop around the outside edge of the grocery store. This is where you will most often find fresh fruits and vegetables, bulk grains, fresh meats, and fresh dairy products. Cooking Use low-heat cooking  methods, such as baking, instead of high-heat cooking methods, such as deep frying. Cook using healthy oils, such as olive, canola, or sunflower oil. Avoid cooking with butter, cream, or high-fat meats. Meal planning Eat meals and snacks regularly, preferably at the same times every day. Avoid going long periods of time without eating. Eat foods that are high in fiber, such as fresh fruits, vegetables, beans, and whole grains. Eat 4-6 oz (112-168 g) of lean protein each day, such as lean meat, chicken, fish, eggs, or tofu. One ounce (oz) (28 g) of lean protein is equal to: 1 oz (28 g) of meat, chicken, or fish. 1 egg.  cup (62 g) of tofu. Eat some foods each day that contain healthy fats, such as avocado, nuts, seeds, and fish. What foods should I eat? Fruits Berries. Apples. Oranges. Peaches. Apricots. Plums. Grapes. Mangoes. Papayas. Pomegranates. Kiwi. Cherries. Vegetables Leafy greens, including lettuce, spinach, kale, chard, collard greens, mustard greens, and cabbage. Beets. Cauliflower. Broccoli. Carrots. Green beans. Tomatoes. Peppers. Onions. Cucumbers. Brussels sprouts. Grains Whole grains, such as whole-wheat or whole-grain bread, crackers, tortillas, cereal, and pasta. Unsweetened oatmeal. Quinoa. Brown or wild rice. Meats and other proteins Seafood. Poultry without skin. Lean cuts of poultry and beef. Tofu. Nuts. Seeds.  Dairy Low-fat or fat-free dairy products such as milk, yogurt, and cheese. The items listed above may not be a complete list of foods and beverages you can eat and drink. Contact a dietitian for more information. What foods should I avoid? Fruits Fruits canned with syrup. Vegetables Canned vegetables. Frozen vegetables with butter or cream sauce. Grains Refined white flour and flour products such as bread, pasta, snack foods, and cereals. Avoid all processed foods. Meats and other proteins Fatty cuts of meat. Poultry with skin. Breaded or fried meats.  Processed meat. Avoid saturated fats. Dairy Full-fat yogurt, cheese, or milk. Beverages Sweetened drinks, such as soda or iced tea. The items listed above may not be a complete list of foods and beverages you should avoid. Contact a dietitian for more information. Questions to ask a health care provider Do I need to meet with a certified diabetes care and education specialist? Do I need to meet with a dietitian? What number can I call if I have questions? When are the best times to check my blood glucose? Where to find more information: American Diabetes Association: diabetes.org Academy of Nutrition and Dietetics: eatright.[employer] of Diabetes and Digestive and Kidney Diseases: [employer] Association of Diabetes Care & Education Specialists: diabeteseducator.org Summary It is important to have healthy eating habits because your blood sugar (glucose) levels are greatly affected by what you eat and drink. It is important to use alcohol carefully. A healthy meal plan will help you manage your blood glucose and lower your risk of heart disease. Your health care provider may recommend that you work with a dietitian to make a meal plan that is best for you. This information is not intended to replace advice given to you by your health care provider. Make sure you discuss any questions you have with your health care provider. Document Revised: 01/27/2020 Document Reviewed: 01/27/2020 Elsevier Patient Education  [place].

## 2021-12-15 LAB — VITAMIN B12: Vitamin B-12: 841 pg/mL (ref 232–1245)

## 2021-12-25 LAB — HM MAMMOGRAPHY: HM Mammogram: NORMAL (ref 0–4)

## 2022-01-01 ENCOUNTER — Telehealth: Payer: Self-pay

## 2022-01-01 NOTE — Chronic Care Management (AMB) (Signed)
Chronic Care Management Pharmacy Assistant   Name: [name]  MRN: [id number] DOB: [birth year]  Reason for Encounter: Disease State/ Hypertension  Recent office visits:  12-14-2021 Glendale Chard, MD. Follow up visit no changes.  10-10-2021 Glendale Chard, MD. Calcium, Ion= 5.8. Hemo= 16.1, Hema= 47.6. Sodium= 145, Chloride= 107, Calcium= 10.7, ALT= 39. A1C= 6.9. PTH= 81. Abnormal UA. CT chest W/O contrast ordered. CHANGE Synthroid 75 mcg daily before breakfast TO 50 mcg daily. COMPLETED tessalon and oxycodone.  Recent consult visits:  12-25-2021 Mammo gram screening completed.  12-19-2021 Loretha Stapler, PA (Urgent care). Visit for Cerumen impaction. Removal of impacted serum completed.  12-19-2021 Sheran Luz, MD (Occupational Therapy). Visit for osteoarthritis of first carpometacarpal joint of right hand.   12-06-2021 Sheran Luz, MD (Occupational Therapy). Visit for osteoarthritis of first carpometacarpal joint of right hand.  10-11-2021 Sheran Luz, MD (Occupational Therapy). Post op exam.  09-06-2021 Sheran Luz, MD. (Occupational Therapy). Post op exam.  08-09-2021 Sheran Luz, MD. (Occupational Therapy). Post op exam.  08-02-2021 Sheran Luz, MD. (Occupational Therapy). Post op exam.  07-25-2021 Sheran Luz, MD. Right carpal tunnel surgery. START oxycodone 5-325 mg every 4 hours as needed.  07-24-2021 Alethia Berthold, PA-C (Cardiology). EKG completed. STOP multivitamin.  07-20-2021 Sheran Luz, MD. (Occupational Therapy). Pre-admissions testing.  07-15-2021 Bullard, Hurshel Keys, NP ([facility]). Visit for Nocturnal cough with wheeze. START albuterol inhaler 2 puffs every 6 hours as needed.  Hospital visits:  None in previous 6 months  Medications: Outpatient Encounter Medications as of 01/01/2022  Medication Sig   albuterol (VENTOLIN HFA) 108 (90 Base) MCG/ACT inhaler Inhale 1 puff into the lungs as needed.    Aspirin (ECOTRIN PO) Take 81 mg by mouth daily.    BENICAR 20 MG tablet TAKE 1 TABLET BY MOUTH  DAILY   bimatoprost (LUMIGAN) 0.01 % SOLN Place 1 drop into both eyes at bedtime.    Blood Glucose Monitoring Suppl (ONETOUCH VERIO FLEX SYSTEM) w/Device KIT USE TO CHECK BLOOD SUGAR 3  TIMES DAILY   CRESTOR 20 MG tablet Take 1 tablet by mouth at bedtime   famotidine (PEPCID) 20 MG tablet Take by mouth.   furosemide (LASIX) 20 MG tablet TAKE 1 TABLET BY MOUTH IN  THE MORNING   glucose blood (ONETOUCH VERIO) test strip Use as instructed to check blood sugars 3 times daily E11.69   Lancets (ONETOUCH DELICA PLUS [id number]) MISC USE ONE LANCET 3 TIMES  DAILY BEFORE MEALS   levothyroxine (SYNTHROID) 50 MCG tablet Take 1 tablet (50 mcg total) by mouth daily.   Lifitegrast (XIIDRA) 5 % SOLN Apply to eye. xiidra   MAGNESIUM-OXIDE 400 (240 Mg) MG tablet TAKE 1 TABLET BY MOUTH EVERY DAY AFTER SUPPER   polyethylene glycol (MIRALAX / GLYCOLAX) packet Take 17 g by mouth daily.   vitamin B-6 (PYRIDOXINE) 25 MG tablet Take 25 mg by mouth daily.   No facility-administered encounter medications on file as of 01/01/2022.   Reviewed chart prior to disease state call. Spoke with patient regarding BP  Recent Office Vitals: BP Readings from Last 3 Encounters:  12/14/21 122/76  10/10/21 116/80  07/25/21 (!) 120/55   Pulse Readings from Last 3 Encounters:  12/14/21 72  10/10/21 78  07/25/21 66    Wt Readings from Last 3 Encounters:  12/14/21 214 lb (97.1 kg)  10/10/21 215 lb 6.4 oz (97.7 kg)  07/25/21 215 lb 2.7 oz (97.6 kg)  Kidney Function Lab Results  Component Value Date/Time   CREATININE 0.70 10/10/2021 12:27 PM   CREATININE 0.71 07/20/2021 02:27 PM   GFRNONAA >60 07/20/2021 02:27 PM   GFRAA 92 07/13/2020 03:50 PM       Latest Ref Rng & Units 10/10/2021   12:27 PM 07/20/2021    2:27 PM 06/22/2021    9:33 AM  BMP  Glucose 70 - 99 mg/dL 98  120  114   BUN 8 - 27 mg/dL $Remove'13  15  14    'IArsvpQ$ Creatinine 0.57 - 1.00 mg/dL 0.70  0.71  0.73   BUN/Creat Ratio 12 - $Re'28 19   19   'zeM$ Sodium 134 - 144 mmol/L 145  143  141   Potassium 3.5 - 5.2 mmol/L 4.3  4.5  4.7   Chloride 96 - 106 mmol/L 107  106  103   CO2 20 - 29 mmol/L $RemoveB'23  28  23   'asralDmg$ Calcium 8.7 - 10.3 mg/dL 10.7  10.8  11.0     Current antihypertensive regimen:  Benicar 20 mg daily  How often are you checking your Blood Pressure? weekly  Current home BP readings: 130/78 70, 126/72 72  What recent interventions/DTPs have been made by any provider to improve Blood Pressure control since last CPP Visit:  Educated on Daily salt intake goal < 2300 mg; Exercise goal of 150 minutes per week; -Counseled to monitor BP at home at least once per week, document, and provide log at future appointments -Counseled on diet and exercise extensively Recommended to continue current medication  Any recent hospitalizations or ED visits since last visit with CPP? Yes  What diet changes have been made to improve Blood Pressure Control?  Patient stated she doesn't add salt to food and drinks 4 bottles of water daily.  What exercise is being done to improve your Blood Pressure Control?  Patient does water aerobics 4 times weekly.  Adherence Review: Is the patient currently on ACE/ARB medication? No Does the patient have >5 day gap between last estimated fill dates? No   Care Gaps: Yearly foot exam overdue Yearly ophthalmology exam overdue AWV 05-02-2022  Star Rating Drugs: Rosuvastatin 20 mg- Last filled 10-14-2021 90 DS [place] Clinical Pharmacist Assistant [phone]

## 2022-01-11 LAB — HM DIABETES EYE EXAM

## 2022-01-15 ENCOUNTER — Encounter (HOSPITAL_COMMUNITY)
Admission: RE | Admit: 2022-01-15 | Discharge: 2022-01-15 | Disposition: A | Discharge status: Home / Self Care | Payer: Medicare Other | Source: Ambulatory Visit | Attending: Internal Medicine | Admitting: Internal Medicine

## 2022-01-15 ENCOUNTER — Other Ambulatory Visit: Payer: Self-pay | Admitting: Internal Medicine

## 2022-01-15 DIAGNOSIS — E213 Hyperparathyroidism, unspecified: Secondary | ICD-10-CM

## 2022-01-15 DIAGNOSIS — D351 Benign neoplasm of parathyroid gland: Secondary | ICD-10-CM

## 2022-01-15 MED ORDER — TECHNETIUM TC 99M SESTAMIBI GENERIC - CARDIOLITE
26.4000 | Freq: Once | INTRAVENOUS | Status: AC | PRN
  Administered 2022-01-15: 26.4 via INTRAVENOUS

## 2022-01-16 ENCOUNTER — Encounter: Payer: Self-pay | Admitting: Internal Medicine

## 2022-01-16 ENCOUNTER — Ambulatory Visit (INDEPENDENT_AMBULATORY_CARE_PROVIDER_SITE_OTHER): Payer: Medicare Other | Admitting: Internal Medicine

## 2022-01-16 ENCOUNTER — Other Ambulatory Visit (HOSPITAL_BASED_OUTPATIENT_CLINIC_OR_DEPARTMENT_OTHER): Payer: Self-pay

## 2022-01-16 VITALS — BP 130/82 | HR 80 | Temp 97.9°F | Ht 61.8 in | Wt 216.4 lb

## 2022-01-16 DIAGNOSIS — E2839 Other primary ovarian failure: Secondary | ICD-10-CM | POA: Diagnosis not present

## 2022-01-16 DIAGNOSIS — D351 Benign neoplasm of parathyroid gland: Secondary | ICD-10-CM

## 2022-01-16 DIAGNOSIS — E1169 Type 2 diabetes mellitus with other specified complication: Secondary | ICD-10-CM | POA: Diagnosis not present

## 2022-01-16 DIAGNOSIS — E66812 Obesity, class 2: Secondary | ICD-10-CM

## 2022-01-16 DIAGNOSIS — I10 Essential (primary) hypertension: Secondary | ICD-10-CM

## 2022-01-16 DIAGNOSIS — E785 Hyperlipidemia, unspecified: Secondary | ICD-10-CM

## 2022-01-16 DIAGNOSIS — Z6839 Body mass index (BMI) 39.0-39.9, adult: Secondary | ICD-10-CM

## 2022-01-16 MED ORDER — CRESTOR 20 MG PO TABS
ORAL_TABLET | ORAL | 2 refills | Status: DC
  Filled 2022-01-16: qty 90, 90d supply, fill #0

## 2022-01-16 NOTE — Progress Notes (Signed)
[name],acting as a Education administrator for [name], MD.,have documented all relevant documentation on the behalf of [name], MD,as directed by  [name], MD while in the presence of [name], MD.    Subjective:     Patient ID: [name] , female    DOB: 1941-04-13 , 81 y.o.   MRN: [id number]   Chief Complaint  Patient presents with   Hypertension   Diabetes    HPI  She presents today for BP and DM f/u. She reports compliance with meds. Denies headaches, chest pain and palpitations. Patient stated her right ear has been hurting for the past few days. She denies fever/chills. She wants to make sure she does not have ear infection. Denies change in her hearing.   Hypertension This is a chronic problem. The current episode started more than 1 year ago. The problem has been gradually improving since onset. The problem is controlled. Pertinent negatives include no blurred vision, chest pain, palpitations or shortness of breath. Agents associated with hypertension include thyroid hormones. Risk factors for coronary artery disease include dyslipidemia, obesity, post-menopausal state and sedentary lifestyle. Past treatments include angiotensin blockers. The current treatment provides moderate improvement. Compliance problems include exercise.   Diabetes She presents for her follow-up diabetic visit. She has type 2 diabetes mellitus. Her disease course has been stable. There are no hypoglycemic associated symptoms. There are no diabetic associated symptoms. Pertinent negatives for diabetes include no blurred vision and no chest pain. There are no hypoglycemic complications. Risk factors for coronary artery disease include diabetes mellitus, dyslipidemia, hypertension, obesity, post-menopausal and sedentary lifestyle. She participates in exercise intermittently. Eye exam is current.     Past Medical History:  Diagnosis Date   Arthritis    RIGHT KNEE PAIN AND OA    Chest pain 05/03/2015   Diabetes mellitus without complication (HCC)    Elevated cholesterol    GERD (gastroesophageal reflux disease)    Hypertension    Hypothyroidism    IBS (irritable bowel syndrome)    Rotator cuff disorder    BOTH SHOULDERS - NO SURGERY - AND STATES LIMITATIONS IN ARM MOVEMENT   Shortness of breath 05/03/2015   Swelling    CHRONIC SWELLING RIGHT HAND AND BOTH FEET AND BOTH LEGS   Vertigo      Family History  Problem Relation Age of Onset   Diabetes Mother    Dementia Mother    Diabetes Father    Diabetes Sister    Breast cancer Sister    Kidney cancer Sister    Lung cancer Sister    Lung cancer Maternal Aunt    Pancreatic cancer Maternal Aunt    Cancer Other      Current Outpatient Medications:    albuterol (VENTOLIN HFA) 108 (90 Base) MCG/ACT inhaler, Inhale 1 puff into the lungs as needed., Disp: , Rfl:    Aspirin (ECOTRIN PO), Take 81 mg by mouth daily. , Disp: , Rfl:    BENICAR 20 MG tablet, TAKE 1 TABLET BY MOUTH  DAILY, Disp: 90 tablet, Rfl: 3   bimatoprost (LUMIGAN) 0.01 % SOLN, Place 1 drop into both eyes at bedtime. , Disp: , Rfl:    Blood Glucose Monitoring Suppl (ONETOUCH VERIO FLEX SYSTEM) w/Device KIT, USE TO CHECK BLOOD SUGAR 3  TIMES DAILY, Disp: 1 kit, Rfl: 1   famotidine (PEPCID) 20 MG tablet, Take by mouth., Disp: , Rfl:    furosemide (LASIX) 20 MG tablet, TAKE 1  TABLET BY MOUTH IN  THE MORNING, Disp: 90 tablet, Rfl: 3   glucose blood (ONETOUCH VERIO) test strip, Use as instructed to check blood sugars 3 times daily E11.69, Disp: 100 each, Rfl: 12   Lancets (ONETOUCH DELICA PLUS [id number]) MISC, USE ONE LANCET 3 TIMES  DAILY BEFORE MEALS, Disp: 300 each, Rfl: 3   levothyroxine (SYNTHROID) 50 MCG tablet, Take 1 tablet (50 mcg total) by mouth daily., Disp: 30 tablet, Rfl: 11   Lifitegrast (XIIDRA) 5 % SOLN, Apply to eye. xiidra, Disp: , Rfl:    MAGNESIUM-OXIDE 400 (240 Mg) MG tablet, TAKE 1 TABLET BY MOUTH EVERY DAY AFTER SUPPER, Disp:  90 tablet, Rfl: 1   polyethylene glycol (MIRALAX / GLYCOLAX) packet, Take 17 g by mouth daily., Disp: , Rfl:    vitamin B-6 (PYRIDOXINE) 25 MG tablet, Take 25 mg by mouth daily., Disp: , Rfl:    CRESTOR 20 MG tablet, Take 1 tablet by mouth at bedtime, Disp: 90 tablet, Rfl: 2   Allergies  Allergen Reactions   Codeine Nausea And Vomiting   Tramadol Nausea And Vomiting   Macrobid [Nitrofurantoin Macrocrystal] Rash     Review of Systems  Constitutional: Negative.   HENT:  Positive for ear pain.   Eyes:  Negative for blurred vision.  Respiratory: Negative.  Negative for shortness of breath.   Cardiovascular: Negative.  Negative for chest pain and palpitations.  Gastrointestinal: Negative.   Neurological: Negative.   Psychiatric/Behavioral: Negative.       Today's Vitals   01/16/22 1156  BP: 130/82  Pulse: 80  Temp: 97.9 F (36.6 C)  Weight: 216 lb 6.4 oz (98.2 kg)  Height: 5' 1.8" (1.57 m)  PainSc: 0-No pain   Body mass index is 39.84 kg/m.  Wt Readings from Last 3 Encounters:  01/16/22 216 lb 6.4 oz (98.2 kg)  12/14/21 214 lb (97.1 kg)  10/10/21 215 lb 6.4 oz (97.7 kg)    Objective:  Physical Exam Vitals and nursing note reviewed.  Constitutional:      Appearance: Normal appearance. She is obese.  HENT:     Head: Normocephalic and atraumatic.     Right Ear: Tympanic membrane, ear canal and external ear normal. There is no impacted cerumen.     Left Ear: Tympanic membrane, ear canal and external ear normal. There is no impacted cerumen.  Eyes:     Extraocular Movements: Extraocular movements intact.  Cardiovascular:     Rate and Rhythm: Normal rate and regular rhythm.     Heart sounds: Normal heart sounds.  Pulmonary:     Effort: Pulmonary effort is normal.     Breath sounds: Normal breath sounds.  Musculoskeletal:     Cervical back: Normal range of motion.  Skin:    General: Skin is warm.  Neurological:     General: No focal deficit present.     Mental  Status: She is alert.  Psychiatric:        Mood and Affect: Mood normal.        Behavior: Behavior normal.      Assessment And Plan:     1. Essential hypertension Comments: Chronic, controlled. She will c/w olmesartan and furosemide daily.   2. Dyslipidemia associated with type 2 diabetes mellitus ([place]) Comments: Goal LDL<70. Currently on Crestor $RemoveBe'20mg'uHYYaIfhm$  daily. Has tried Ozempic, this made her tired. She is hesitant to try any other medications.  - Hemoglobin A1c - BMP8+EGFR - Lipid panel  3. Parathyroid adenoma Comments: I will refer  her to General Surgery for surgical consultation for parathyroidectomy. Parathyroid scan results reviewed with patient in detail.   4. Estrogen deficiency Comments: She agrees to bone density referral. She is reminded to take calcium/vitamin D supplements and engage in regular weight-bearing exercises.  - DG Bone Density; Future  5. Class 2 severe obesity due to excess calories with serious comorbidity and body mass index (BMI) of 39.0 to 39.9 in adult [facility]) Comments: She is encouraged to aim for at least 150 minutes of exercise/week, while initially striving for BMI<35 to decrease cardiac risk.    Patient was given opportunity to ask questions. Patient verbalized understanding of the plan and was able to repeat key elements of the plan. All questions were answered to their satisfaction.   I, [name], MD, have reviewed all documentation for this visit. The documentation on 01/16/22 for the exam, diagnosis, procedures, and orders are all accurate and complete.   IF YOU HAVE BEEN REFERRED TO A SPECIALIST, IT MAY TAKE 1-2 WEEKS TO SCHEDULE/PROCESS THE REFERRAL. IF YOU HAVE NOT HEARD FROM US/SPECIALIST IN TWO WEEKS, PLEASE GIVE [place] A CALL AT [phone].   THE PATIENT IS ENCOURAGED TO PRACTICE SOCIAL DISTANCING DUE TO THE COVID-19 PANDEMIC.

## 2022-01-16 NOTE — Patient Instructions (Signed)

## 2022-01-17 ENCOUNTER — Encounter: Payer: Self-pay | Admitting: Internal Medicine

## 2022-01-17 LAB — BMP8+EGFR
BUN/Creatinine Ratio: 24 (ref 12–28)
BUN: 14 mg/dL (ref 8–27)
CO2: 23 mmol/L (ref 20–29)
Calcium: 10.8 mg/dL — ABNORMAL HIGH (ref 8.7–10.3)
Chloride: 106 mmol/L (ref 96–106)
Creatinine, Ser: 0.58 mg/dL (ref 0.57–1.00)
Glucose: 112 mg/dL — ABNORMAL HIGH (ref 70–99)
Potassium: 4.3 mmol/L (ref 3.5–5.2)
Sodium: 141 mmol/L (ref 134–144)
eGFR: 90 mL/min/{1.73_m2} (ref >59)

## 2022-01-17 LAB — LIPID PANEL
Chol/HDL Ratio: 2.5 ratio (ref 0.0–4.4)
Cholesterol, Total: 149 mg/dL (ref 100–199)
HDL: 60 mg/dL (ref >39)
LDL Chol Calc (NIH): 74 mg/dL (ref 0–99)
Triglycerides: 78 mg/dL (ref 0–149)
VLDL Cholesterol Cal: 15 mg/dL (ref 5–40)

## 2022-01-17 LAB — HEMOGLOBIN A1C
Est. average glucose Bld gHb Est-mCnc: 140 mg/dL
Hgb A1c MFr Bld: 6.5 % — ABNORMAL HIGH (ref 4.8–5.6)

## 2022-01-22 ENCOUNTER — Ambulatory Visit: Payer: Medicare Other | Admitting: Cardiology

## 2022-01-23 ENCOUNTER — Ambulatory Visit: Payer: Medicare Other | Admitting: Cardiology

## 2022-02-07 ENCOUNTER — Telehealth: Payer: Self-pay

## 2022-02-07 NOTE — Chronic Care Management (AMB) (Signed)
Chronic Care Management Pharmacy Assistant   Name: JENEVA SCHWEIZER  MRN: [id number] DOB: [birth year]  Reason for Encounter: Disease State/ Hypertension  Recent office visits:  01-16-2022 Glendale Chard, MD. Glucose= 112, Calcium= 10.8. A1C= 6.5. DG bone density ordered.  Recent consult visits:  01-01-2022 Sheran Luz, MD (Ortho surgery). Unable to view encounter.  01-01-2022 Hilliard, Jennye Boroughs, OTR/L (OT). Follow up.  Hospital visits:  None in previous 6 months  Medications: Outpatient Encounter Medications as of 02/07/2022  Medication Sig   albuterol (VENTOLIN HFA) 108 (90 Base) MCG/ACT inhaler Inhale 1 puff into the lungs as needed.   Aspirin (ECOTRIN PO) Take 81 mg by mouth daily.    BENICAR 20 MG tablet TAKE 1 TABLET BY MOUTH  DAILY   bimatoprost (LUMIGAN) 0.01 % SOLN Place 1 drop into both eyes at bedtime.    Blood Glucose Monitoring Suppl (ONETOUCH VERIO FLEX SYSTEM) w/Device KIT USE TO CHECK BLOOD SUGAR 3  TIMES DAILY   CRESTOR 20 MG tablet Take 1 tablet by mouth at bedtime   famotidine (PEPCID) 20 MG tablet Take by mouth.   furosemide (LASIX) 20 MG tablet TAKE 1 TABLET BY MOUTH IN  THE MORNING   glucose blood (ONETOUCH VERIO) test strip Use as instructed to check blood sugars 3 times daily E11.69   Lancets (ONETOUCH DELICA PLUS [id number]) MISC USE ONE LANCET 3 TIMES  DAILY BEFORE MEALS   levothyroxine (SYNTHROID) 50 MCG tablet Take 1 tablet (50 mcg total) by mouth daily.   Lifitegrast (XIIDRA) 5 % SOLN Apply to eye. xiidra   MAGNESIUM-OXIDE 400 (240 Mg) MG tablet TAKE 1 TABLET BY MOUTH EVERY DAY AFTER SUPPER   polyethylene glycol (MIRALAX / GLYCOLAX) packet Take 17 g by mouth daily.   vitamin B-6 (PYRIDOXINE) 25 MG tablet Take 25 mg by mouth daily.   No facility-administered encounter medications on file as of 02/07/2022.  Reviewed chart prior to disease state call.   Recent Office Vitals: BP Readings from Last 3 Encounters:  01/16/22 130/82  12/14/21  122/76  10/10/21 116/80   Pulse Readings from Last 3 Encounters:  01/16/22 80  12/14/21 72  10/10/21 78    Wt Readings from Last 3 Encounters:  01/16/22 216 lb 6.4 oz (98.2 kg)  12/14/21 214 lb (97.1 kg)  10/10/21 215 lb 6.4 oz (97.7 kg)     Kidney Function Lab Results  Component Value Date/Time   CREATININE 0.58 01/16/2022 12:43 PM   CREATININE 0.70 10/10/2021 12:27 PM   GFRNONAA >60 07/20/2021 02:27 PM   GFRAA 92 07/13/2020 03:50 PM       Latest Ref Rng & Units 01/16/2022   12:43 PM 10/10/2021   12:27 PM 07/20/2021    2:27 PM  BMP  Glucose 70 - 99 mg/dL 112  98  120   BUN 8 - 27 mg/dL $Remove'14  13  15   'fETBprz$ Creatinine 0.57 - 1.00 mg/dL 0.58  0.70  0.71   BUN/Creat Ratio 12 - $Re'28 24  19    'ZZU$ Sodium 134 - 144 mmol/L 141  145  143   Potassium 3.5 - 5.2 mmol/L 4.3  4.3  4.5   Chloride 96 - 106 mmol/L 106  107  106   CO2 20 - 29 mmol/L $RemoveB'23  23  28   'naDfMBSl$ Calcium 8.7 - 10.3 mg/dL 10.8  10.7  10.8     02-07-2022: 1st attempt left VM 02-13-2022: 2nd attempt left VM 02-15-2022: 3rd attempt left VM  Care  Gaps: Yearly foot exam overdue AWV 05-02-2022 Flu vaccine overdue  Star Rating Drugs: Rosuvastatin 20 mg- Last filled 01-16-2022 90 DS [facility] Olmesartan 20 mg- Last filled 01-08-2022 90 DS [place] Clinical Pharmacist Assistant [phone]

## 2022-02-08 LAB — HM DEXA SCAN

## 2022-02-09 ENCOUNTER — Encounter: Payer: Self-pay | Admitting: Cardiology

## 2022-02-09 ENCOUNTER — Ambulatory Visit: Payer: Medicare Other | Admitting: Cardiology

## 2022-02-09 VITALS — BP 123/69 | HR 81 | Temp 98.0°F | Resp 16 | Ht 61.0 in | Wt 216.8 lb

## 2022-02-09 DIAGNOSIS — R0609 Other forms of dyspnea: Secondary | ICD-10-CM

## 2022-02-09 DIAGNOSIS — I10 Essential (primary) hypertension: Secondary | ICD-10-CM

## 2022-02-09 DIAGNOSIS — E7800 Pure hypercholesterolemia, unspecified: Secondary | ICD-10-CM

## 2022-02-09 DIAGNOSIS — R739 Hyperglycemia, unspecified: Secondary | ICD-10-CM

## 2022-02-09 NOTE — Progress Notes (Signed)
Primary Physician/Referring:  Glendale Chard, MD  Patient ID: [name], female    DOB: 03/09/1941, 81 y.o.   MRN: [id number]  Chief Complaint  Patient presents with  . Hypertension  . Hyperlipidemia  . Loss of Consciousness  . Follow-up    6 months   HPI:    [name]  is a 81 y.o. AA female with history of hypertension, hyperlipidemia, diabetes mellitus, hypothyroidism, vertigo.  She presented to emergency department 05/30/2021 following an episode of syncope.  Denies history of MI, TIA/CVA, sleep apnea, family history of premature CAD.  She was seen about a year ago for syncope, event monitor revealed occasional PACs and PVCs with a single episode of SVT without evidence of any high degree heart block.  She did not have any other symptoms.  She has not had any recurrence.  Now referred back to me for consideration for further work-up for dyspnea on exertion and evaluation for possible CAD in view of diet-controlled diabetes and hypertension hyperlipidemia as risk factors.  Due to chronic dyspnea on exertion, underlying risk factors, patient also worried about CAD.  No new symptoms, dyspnea has been chronic and stable.  No PND or orthopnea.  She has not had any palpitations, she has not had any further episodes of syncope.  Past Medical History:  Diagnosis Date  . Arthritis    RIGHT KNEE PAIN AND OA  . Chest pain 05/03/2015  . Diabetes mellitus without complication ([place])   . Elevated cholesterol   . GERD (gastroesophageal reflux disease)   . Hypertension   . Hypothyroidism   . IBS (irritable bowel syndrome)   . Rotator cuff disorder    BOTH SHOULDERS - NO SURGERY - AND STATES LIMITATIONS IN ARM MOVEMENT  . Shortness of breath 05/03/2015  . Swelling    CHRONIC SWELLING RIGHT HAND AND BOTH FEET AND BOTH LEGS  . Vertigo    Past Surgical History:  Procedure Laterality Date  . ABDOMINAL HYSTERECTOMY  1985  . BREAST SURGERY  1985   RIGHT BREAST BIOPSY - BENIGN  . CARPAL  TUNNEL RELEASE Left 02/05/2019   Procedure: LEFT CARPAL TUNNEL RELEASE;  Surgeon: Daryll Brod, MD;  Location: [facility];  Service: Orthopedics;  Laterality: Left;  . CARPAL TUNNEL RELEASE Right 07/25/2021   Procedure: RIGHT CARPAL TUNNEL RELEASE;  Surgeon: Daryll Brod, MD;  Location: [facility];  Service: Orthopedics;  Laterality: Right;  Bier block  . CHOLECYSTECTOMY  1975  . EYE SURGERY  1995   BILATERAL CATARACT EXTRACTIONS; SURGERY FOR MACULAR HOLE   . [place]  . JOINT REPLACEMENT    . 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 Smoking status: Never  . Smokeless tobacco: Never  Substance Use Topics  . Alcohol use: Not Currently    Comment: OCCAS ALCOHOL   Marital Status: Widowed   ROS  Review of Systems  Cardiovascular:  Positive for dyspnea on exertion. Negative for chest pain and leg swelling.    Objective  Blood pressure 123/69, pulse 81, temperature 98 F (36.7 C), temperature source Temporal, resp. rate 16, height _0  (1.549 m), weight 216 lb 12.8 oz (98.3 kg), SpO2 96 %.     02/09/2022   11:32 AM 01/16/2022   11:56 AM 12/14/2021  11:35 AM  Vitals with BMI  Height _0  5' 1.8" _1   Weight 216 lbs 13 oz 216 lbs 6 oz 214 lbs  BMI 40.99 97.41 63.84  Systolic 536 468 032  Diastolic 69 82 76  Pulse 81 80 72    Orthostatic VS for the past 72 hrs (Last 3 readings):  Orthostatic BP Patient Position BP Location Cuff Size Orthostatic Pulse  02/09/22 1146 152/72 Standing Left Arm Large 81  02/09/22 1145 129/59 Sitting Left Arm Large 72  02/09/22 1143 114/60 Supine Left Arm Large 63    Physical Exam Vitals reviewed.  Neck:     Vascular: No carotid bruit or JVD.  Cardiovascular:     Rate and Rhythm: Normal rate and regular rhythm.     Pulses: Intact  distal pulses.          Radial pulses are 2+ on the right side and 2+ on the left side.       Popliteal pulses are 2+ on the right side and 2+ on the left side.       Dorsalis pedis pulses are 2+ on the right side and 2+ on the left side.       Posterior tibial pulses are 1+ on the right side and 1+ on the left side.     Heart sounds: S1 normal and S2 normal. No murmur heard.    No gallop.  Pulmonary:     Effort: Pulmonary effort is normal. No respiratory distress.     Breath sounds: No wheezing, rhonchi or rales.  Abdominal:     General: Bowel sounds are normal.     Palpations: Abdomen is soft.  Musculoskeletal:     Right lower leg: No edema.     Left lower leg: No edema.   Laboratory examination:   Recent Labs    05/30/21 1809 06/22/21 0933 07/20/21 1427 10/10/21 1227 01/16/22 1243  NA 137   < > 143 145* 141  K 3.9   < > 4.5 4.3 4.3  CL 105   < > 106 107* 106  CO2 23   < > _2 GLUCOSE 104*   < > 120* 98 112*  BUN 17   < > _3 CREATININE 0.68   < > 0.71 0.70 0.58  CALCIUM 10.4*   < > 10.8* 10.7* 10.8*  GFRNONAA >60  --  >60  --   --    < > = values in this interval not displayed.   CrCl cannot be calculated (Patient's most recent lab result is older than the maximum 21 days allowed.).     Latest Ref Rng & Units 01/16/2022   12:43 PM 10/10/2021   12:27 PM 07/20/2021    2:27 PM  CMP  Glucose 70 - 99 mg/dL 112  98  120   BUN 8 - 27 mg/dL _4 Creatinine 0.57 - 1.00 mg/dL 0.58  0.70  0.71   Sodium 134 - 144 mmol/L 141  145  143   Potassium 3.5 - 5.2 mmol/L 4.3  4.3  4.5   Chloride 96 - 106 mmol/L 106  107  106   CO2 20 - 29 mmol/L _5 Calcium 8.7 - 10.3 mg/dL 10.8  10.7  10.8   Total Protein 6.0 - 8.5 g/dL  6.3    Total Bilirubin 0.0 - 1.2 mg/dL  0.6    Alkaline Phos 44 -  121 IU/L  120    AST 0 - 40 IU/L  25    ALT 0 - 32 IU/L  39        Latest Ref Rng & Units 10/10/2021   12:27 PM 05/30/2021    6:09 PM 01/02/2021   12:43 PM  CBC  WBC  3.4 - 10.8 x10E3/uL 4.2  5.3  4.5   Hemoglobin 11.1 - 15.9 g/dL 16.1  16.3  15.6   Hematocrit 34.0 - 46.6 % 47.6  48.2  45.5   Platelets 150 - 450 x10E3/uL 159  186  178     Lipid Panel Recent Labs    07/11/21 1440 01/16/22 1243  CHOL 145 149  TRIG 85 78  LDLCALC 81 74  HDL 48 60  CHOLHDL 3.0 2.5    HEMOGLOBIN A1C Lab Results  Component Value Date   HGBA1C 6.5 (H) 01/16/2022   TSH Recent Labs    05/30/21 1824 06/22/21 0933 08/15/21 1010  TSH 0.993 0.913 1.090   Allergies   Allergies  Allergen Reactions  . Codeine Nausea And Vomiting  . Tramadol Nausea And Vomiting  . Macrobid [Nitrofurantoin Macrocrystal] Rash    Final Medications at End of Visit    Current Outpatient Medications:  .  albuterol (VENTOLIN HFA) 108 (90 Base) MCG/ACT inhaler, Inhale 1 puff into the lungs as needed., Disp: , Rfl:  .  Aspirin (ECOTRIN PO), Take 81 mg by mouth daily. , Disp: , Rfl:  .  BENICAR 20 MG tablet, TAKE 1 TABLET BY MOUTH  DAILY, Disp: 90 tablet, Rfl: 3 .  bimatoprost (LUMIGAN) 0.01 % SOLN, Place 1 drop into both eyes at bedtime. , Disp: , Rfl:  .  Blood Glucose Monitoring Suppl (ONETOUCH VERIO FLEX SYSTEM) w/Device KIT, USE TO CHECK BLOOD SUGAR 3  TIMES DAILY, Disp: 1 kit, Rfl: 1 .  CRESTOR 20 MG tablet, Take 1 tablet by mouth at bedtime, Disp: 90 tablet, Rfl: 2 .  famotidine (PEPCID) 20 MG tablet, Take by mouth., Disp: , Rfl:  .  furosemide (LASIX) 20 MG tablet, TAKE 1 TABLET BY MOUTH IN  THE MORNING, Disp: 90 tablet, Rfl: 3 .  glucose blood (ONETOUCH VERIO) test strip, Use as instructed to check blood sugars 3 times daily E11.69, Disp: 100 each, Rfl: 12 .  Lancets (ONETOUCH DELICA PLUS [id number]) MISC, USE ONE LANCET 3 TIMES  DAILY BEFORE MEALS, Disp: 300 each, Rfl: 3 .  levothyroxine (SYNTHROID) 50 MCG tablet, Take 1 tablet (50 mcg total) by mouth daily., Disp: 30 tablet, Rfl: 11 .  Lifitegrast (XIIDRA) 5 % SOLN, Apply to eye. xiidra, Disp: , Rfl:  .  MAGNESIUM-OXIDE 400  (240 Mg) MG tablet, TAKE 1 TABLET BY MOUTH EVERY DAY AFTER SUPPER, Disp: 90 tablet, Rfl: 1 .  polyethylene glycol (MIRALAX / GLYCOLAX) packet, Take 17 g by mouth daily., Disp: , Rfl:  .  vitamin B-6 (PYRIDOXINE) 25 MG tablet, Take 25 mg by mouth daily., Disp: , Rfl:    Radiology:   No results found.  Cardiac Studies:   Ambulatory cardiac telemetry 14 days 06/08/2021 - 06/22/2021: Predominant underlying rhythm was sinus with single episode of SVT lasting 6 beats at a maximum rate of 167 bpm.  Rare PACs and PVCs.  There were no patient triggered events.  No evidence of significant cardiac arrhythmias.  Lexiscan Tetrofosmin stress test 06/21/2021: No previous exam available for comparison. Lexiscan nuclear stress test performed using 1-day protocol. Mild decrease in apical counts likely due to  breast tissue attenuation, with imaging performed in sitting position. Otherwise, normal myocardial perfusion. Stress LVEF 55%. Low risk study.  ABI 06/20/2021:  This exam reveals normal perfusion of the right lower extremity (ABI 1.14)  and normal perfusion of the left lower extremity (ABI 1.17). Mildly  abnormal biphasic waveform pattern left DP, otherwise normal triphasic waveform pattern.  Echocardiogram 06/20/2021:  Study Quality: Technically difficult study.  Normal LV systolic function with visual EF 60%. Left ventricle cavity is  normal in size. Mild left ventricular hypertrophy. Normal global wall  motion. Normal diastolic filling pattern, normal LAP. Calculated EF 60%.  No significant valvular heart disease.  No prior study for comparison.  EKG:   EKG 02/09/2022: Normal sinus rhythm at rate of 73 bpm, left axis deviation, left anterior fascicular block.  Incomplete right bundle branch block.  LVH with repolarization abnormality.  No significant change from 07/24/2021.   Assessment     ICD-10-CM   1. Dyspnea on exertion  R06.09     2. Hypercholesterolemia  E78.00     3. Essential  hypertension  I10 EKG 12-Lead    4. Hyperglycemia  R73.9        There are no discontinued medications.   No orders of the defined types were placed in this encounter.  Recommendations:   [name] is a 25 y.o. AA female with history of hypertension, hyperlipidemia, diabetes mellitus, hypothyroidism, vertigo.  She presented to emergency department 05/30/2021 following an episode of syncope.  Denies history of MI, TIA/CVA, sleep apnea, family history of premature CAD.  She was seen about a year ago for syncope, event monitor revealed occasional PACs and PVCs with a single episode of SVT without evidence of any high degree heart block.  She did not have any other symptoms.  She has not had any recurrence.  Now referred back to me for consideration for further work-up for dyspnea on exertion and evaluation for possible CAD in view of diet-controlled diabetes and hypertension hyperlipidemia as risk factors.  She has had a nuclear stress test just a year and a half ago, it was negative and low risk and also echocardiogram revealed normal LV systolic function with normal diastolic function.  Her activity limitation is related to obesity hypoventilation and arthritis limiting her physical activity.  She has completely stopped walking and also doing any form of exercise due to arthritis and has only been doing water aerobics.  I have discussed with her regarding elliptical and also stationary bicycle to improve her capacity to carry her own weight which will improve overall exercise capacity.  She is on best medical therapy including excellent control of diabetes, hypertension, hyperlipidemia.  Hence getting a coronary calcium score would not be helpful as she is on appropriate medical therapy, unless she is having anginal symptoms or significant coronary artery disease she will then need probably cardiac catheterization or coronary CTA.  At this point my suspicion for significant CAD is very low,  continue present medical therapy and I will see her back on a as needed basis.    Adrian Prows, PA-C 02/09/2022, 12:06 PM Office: [phone]

## 2022-02-21 ENCOUNTER — Other Ambulatory Visit: Payer: Self-pay | Admitting: Internal Medicine

## 2022-03-01 ENCOUNTER — Other Ambulatory Visit: Payer: Self-pay | Admitting: Surgery

## 2022-03-01 DIAGNOSIS — E210 Primary hyperparathyroidism: Secondary | ICD-10-CM

## 2022-03-02 ENCOUNTER — Ambulatory Visit
Admission: RE | Admit: 2022-03-02 | Discharge: 2022-03-02 | Disposition: A | Discharge status: Home / Self Care | Payer: Medicare Other | Source: Ambulatory Visit | Attending: Surgery | Admitting: Surgery

## 2022-03-02 DIAGNOSIS — E210 Primary hyperparathyroidism: Secondary | ICD-10-CM

## 2022-03-05 NOTE — Progress Notes (Signed)
USN shows a right inferior nodule that may be a lymph node.  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  Will proceed with scheduling surgery for parathyroidectomy.  [place], MD [facility] [place] [facility] Office: [phone]

## 2022-03-11 ENCOUNTER — Other Ambulatory Visit: Payer: Self-pay | Admitting: Internal Medicine

## 2022-03-23 NOTE — Progress Notes (Signed)
Sent message, via epic in basket, requesting orders in epic from surgeon.  

## 2022-03-25 ENCOUNTER — Other Ambulatory Visit: Payer: Self-pay | Admitting: Internal Medicine

## 2022-03-26 ENCOUNTER — Ambulatory Visit: Payer: Self-pay | Admitting: Surgery

## 2022-03-29 NOTE — Patient Instructions (Addendum)
SURGICAL WAITING ROOM VISITATION Patients having surgery or a procedure may have no more than 2 support people in the waiting area - these visitors may rotate.   Children under the age of 2 must have an adult with them who is not the patient. If the patient needs to stay at the hospital during part of their recovery, the visitor guidelines for inpatient rooms apply. Pre-op nurse will coordinate an appropriate time for 1 support person to accompany patient in pre-op.  This support person may not rotate.    Please refer to the [facility] website for the visitor guidelines for Inpatients (after your surgery is over and you are in a regular room).      Your procedure is scheduled on: 04-12-22   Report to [facility] Main Entrance    Report to admitting at 7:45 AM   Call this number if you have problems the morning of surgery [phone]   Do not eat food :After Midnight.   After Midnight you may have the following liquids until 7:00 AM DAY OF SURGERY  Water Non-Citrus Juices (without pulp, NO RED) Carbonated Beverages Black Coffee (NO MILK/CREAM OR CREAMERS, sugar ok)  Clear Tea (NO MILK/CREAM OR CREAMERS, sugar ok) regular and decaf                             Plain Jell-O (NO RED)                                           Fruit ices (not with fruit pulp, NO RED)                                     Popsicles (NO RED)                                                               Sports drinks like Gatorade (NO RED)                     If you have questions, please contact your surgeon's office.   FOLLOW ANY ADDITIONAL PRE OP INSTRUCTIONS YOU RECEIVED FROM YOUR SURGEON'S OFFICE!!!     Oral Hygiene is also important to reduce your risk of infection.                                    Remember - BRUSH YOUR TEETH THE MORNING OF SURGERY WITH YOUR REGULAR TOOTHPASTE   Do NOT smoke after Midnight   Take these medicines the morning of surgery with A SIP OF WATER:    Benicar  Famotidine  Levothyroxine  Inhalers and eyedrops  How to Manage Your Diabetes Before and After Surgery  Why is it important to control my blood sugar before and after surgery? Improving blood sugar levels before and after surgery helps healing and can limit problems. A way of improving blood sugar control is eating a healthy diet by:  Eating less sugar and carbohydrates  Increasing activity/exercise  Talking with your doctor about reaching your blood sugar goals High blood sugars (greater than 180 mg/dL) can raise your risk of infections and slow your recovery, so you will need to focus on controlling your diabetes during the weeks before surgery. Make sure that the doctor who takes care of your diabetes knows about your planned surgery including the date and location.  How do I manage my blood sugar before surgery? Check your blood sugar at least 4 times a day, starting 2 days before surgery, to make sure that the level is not too high or low. Check your blood sugar the morning of your surgery when you wake up and every 2 hours until you get to the Short Stay unit. If your blood sugar is less than 70 mg/dL, you will need to treat for low blood sugar: Do not take insulin. Treat a low blood sugar (less than 70 mg/dL) with  cup of clear juice (cranberry or apple), 4 glucose tablets, OR glucose gel. Recheck blood sugar in 15 minutes after treatment (to make sure it is greater than 70 mg/dL). If your blood sugar is not greater than 70 mg/dL on recheck, call [phone] for further instructions. Report your blood sugar to the short stay nurse when you get to Short Stay.  If you are admitted to the hospital after surgery: Your blood sugar will be checked by the staff and you will probably be given insulin after surgery (instead of oral diabetes medicines) to make sure you have good blood sugar levels. The goal for blood sugar control after surgery is 80-180 mg/dL.   WHAT DO I  DO ABOUT MY DIABETES MEDICATION?  Do not take oral diabetes medicines (pills) the morning of surgery.  Reviewed and Endorsed by [facility] Patient Education Committee, August 2015                               You may not have any metal on your body including hair pins, jewelry, and body piercing             Do not wear make-up, lotions, powders, perfumes or deodorant  Do not wear nail polish including gel and S&S, artificial/acrylic nails, or any other type of covering on natural nails including finger and toenails. If you have artificial nails, gel coating, etc. that needs to be removed by a nail salon please have this removed prior to surgery or surgery may need to be canceled/ delayed if the surgeon/ anesthesia feels like they are unable to be safely monitored.   Do not shave  48 hours prior to surgery.           Do not bring valuables to the hospital. [facility].   Contacts, dentures or bridgework may not be worn into surgery.  DO NOT [place]. PHARMACY WILL DISPENSE MEDICATIONS LISTED ON YOUR MEDICATION LIST TO YOU DURING YOUR ADMISSION [place]!   Patients discharged on the day of surgery will not be allowed to drive home.  Someone NEEDS to stay with you for the first 24 hours after anesthesia.  Special Instructions: Bring a copy of your healthcare power of attorney and living will documents the day of surgery if you haven't scanned them before.  Please read over the following fact sheets you were given: IF YOU HAVE QUESTIONS ABOUT YOUR PRE-OP INSTRUCTIONS PLEASE  CALL [phone] [place]   If you received a COVID test during your pre-op visit  it is requested that you wear a mask when out in public, stay away from anyone that may not be feeling well and notify your surgeon if you develop symptoms. If you test positive for Covid or have been in contact with anyone that has tested positive in the last 10 days  please notify you surgeon.   - Preparing for Surgery Before surgery, you can play an important role.  Because skin is not sterile, your skin needs to be as free of germs as possible.  You can reduce the number of germs on your skin by washing with CHG (chlorahexidine gluconate) soap before surgery.  CHG is an antiseptic cleaner which kills germs and bonds with the skin to continue killing germs even after washing. Please DO NOT use if you have an allergy to CHG or antibacterial soaps.  If your skin becomes reddened/irritated stop using the CHG and inform your nurse when you arrive at Short Stay. Do not shave (including legs and underarms) for at least 48 hours prior to the first CHG shower.  You may shave your face/neck.  Please follow these instructions carefully:  1.  Shower with CHG Soap the night before surgery and the  morning of surgery.  2.  If you choose to wash your hair, wash your hair first as usual with your normal  shampoo.  3.  After you shampoo, rinse your hair and body thoroughly to remove the shampoo.                             4.  Use CHG as you would any other liquid soap.  You can apply chg directly to the skin and wash.  Gently with a scrungie or clean washcloth.  5.  Apply the CHG Soap to your body ONLY FROM THE NECK DOWN.   Do   not use on face/ open                           Wound or open sores. Avoid contact with eyes, ears mouth and   genitals (private parts).                       Wash face,  Genitals (private parts) with your normal soap.             6.  Wash thoroughly, paying special attention to the area where your    surgery  will be performed.  7.  Thoroughly rinse your body with warm water from the neck down.  8.  DO NOT shower/wash with your normal soap after using and rinsing off the CHG Soap.                9.  Pat yourself dry with a clean towel.            10.  Wear clean pajamas.            11.  Place clean sheets on your bed the night of your  first shower and do not  sleep with pets. Day of Surgery : Do not apply any lotions/deodorants the morning of surgery.  Please wear clean clothes to the hospital/surgery center.  FAILURE TO FOLLOW THESE INSTRUCTIONS MAY RESULT IN THE CANCELLATION OF YOUR SURGERY  PATIENT SIGNATURE_________________________________  NURSE SIGNATURE__________________________________  ________________________________________________________________________    

## 2022-03-29 NOTE — Progress Notes (Addendum)
COVID Vaccine Completed:  Yes  Date of COVID positive in last 90 days:  No  PCP - Glendale Chard, MD Cardiologist - Adrian Prows, PA-C  Chest x-ray - 05-30-21 Epic EKG - 02-09-22 Epic Stress Test - 06-21-21 Epic ECHO - 06-20-21 Epic Cardiac Cath - N/A Pacemaker/ICD device last checked: Spinal Cord Stimulator:N/A Long Term Monitor - 2022 Epic  Bowel Prep - N/A  Sleep Study - Yes, neg sleep apnea CPAP - No  Fasting Blood Sugar - 130 to 150 Checks Blood Sugar - 1 time a day  Blood Thinner Instructions: Aspirin Instructions:  ASA 81 mg.  Pt will check with surgeon to see if he wants her to discontinue Last Dose:  Activity level:   Can go up a flight of stairs and perform activities of daily living without symptoms of chest pain.  Patient states that she does have shortness of breath with activity such as climbing stairs and doing housework. She does have to stop and rest between activities.  This has been an ongoing issue but she feels that it has improved some.  Anesthesia review:  Cardiac eval for dyspnea on exertion.  HTN, DM.  Patient also stated that she has been having some swelling of her lower extremities.   Patient denies shortness of breath, fever, cough and chest pain at PAT appointment  Patient verbalized understanding of instructions that were given to them at the PAT appointment. Patient was also instructed that they will need to review over the PAT instructions again at home before surgery.

## 2022-03-30 ENCOUNTER — Encounter (HOSPITAL_COMMUNITY): Payer: Self-pay

## 2022-03-30 ENCOUNTER — Other Ambulatory Visit: Payer: Self-pay

## 2022-03-30 ENCOUNTER — Encounter (HOSPITAL_COMMUNITY)
Admission: RE | Admit: 2022-03-30 | Discharge: 2022-03-30 | Disposition: A | Discharge status: Home / Self Care | Payer: Medicare Other | Source: Ambulatory Visit | Attending: Surgery | Admitting: Surgery

## 2022-03-30 ENCOUNTER — Telehealth: Payer: Self-pay

## 2022-03-30 VITALS — BP 142/69 | HR 68 | Temp 98.2°F | Resp 20 | Ht 63.0 in | Wt 210.3 lb

## 2022-03-30 DIAGNOSIS — Z01818 Encounter for other preprocedural examination: Secondary | ICD-10-CM | POA: Insufficient documentation

## 2022-03-30 DIAGNOSIS — E119 Type 2 diabetes mellitus without complications: Secondary | ICD-10-CM | POA: Diagnosis not present

## 2022-03-30 DIAGNOSIS — I2510 Atherosclerotic heart disease of native coronary artery without angina pectoris: Secondary | ICD-10-CM | POA: Insufficient documentation

## 2022-03-30 HISTORY — DX: Personal history of urinary calculi: Z87.442

## 2022-03-30 HISTORY — DX: Localized swelling, mass and lump, lower limb, bilateral: R22.43

## 2022-03-30 LAB — HEMOGLOBIN A1C
Hgb A1c MFr Bld: 6.6 % — ABNORMAL HIGH (ref 4.8–5.6)
Mean Plasma Glucose: 142.72 mg/dL

## 2022-03-30 LAB — BASIC METABOLIC PANEL
Anion gap: 3 — ABNORMAL LOW (ref 5–15)
BUN: 15 mg/dL (ref 8–23)
CO2: 27 mmol/L (ref 22–32)
Calcium: 10.6 mg/dL — ABNORMAL HIGH (ref 8.9–10.3)
Chloride: 111 mmol/L (ref 98–111)
Creatinine, Ser: 0.61 mg/dL (ref 0.44–1.00)
GFR, Estimated: >60 mL/min (ref >60)
Glucose, Bld: 105 mg/dL — ABNORMAL HIGH (ref 70–99)
Potassium: 4.4 mmol/L (ref 3.5–5.1)
Sodium: 141 mmol/L (ref 135–145)

## 2022-03-30 LAB — GLUCOSE, CAPILLARY: Glucose-Capillary: 115 mg/dL — ABNORMAL HIGH (ref 70–99)

## 2022-03-30 NOTE — Chronic Care Management (AMB) (Signed)
03-30-2022: Left patient a VM that appointment on 04-03-2022 with [name] will have to be rescheduled. Appointment canceled and rescheduled to November.  [place] Pharmacist Assistant [phone]

## 2022-04-03 ENCOUNTER — Telehealth: Payer: Medicare Other

## 2022-04-05 IMAGING — MR MR LUMBAR SPINE W/O CM
4 of 5 series · 27 of 48 positions shown · non-contrast
Comparison: None.

CLINICAL DATA: Chronic left low back pain

EXAM:
MRI LUMBAR SPINE WITHOUT CONTRAST
TECHNIQUE: Multiplanar, multisequence MR imaging of the lumbar spine was
performed. No intravenous contrast was administered.

[Series 2: T2 · sagittal · 4.0mm · 1.02mm/px · 6 of 17 slices shown (1 of 2)]
[im 1/17]
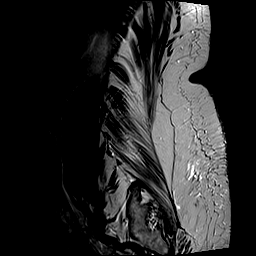
[im 4/17]
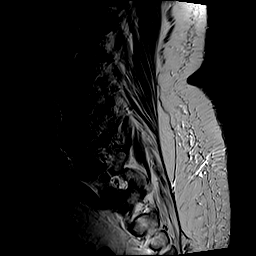
[im 7/17]
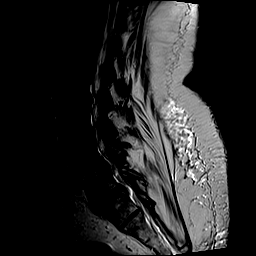
[im 10/17]
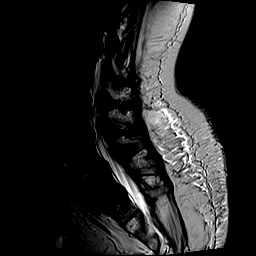
[im 13/17]
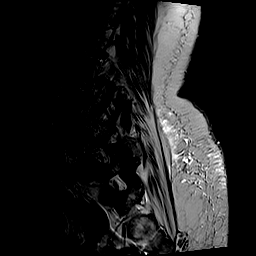
[im 17/17]
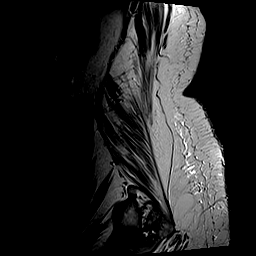

[Series 3: T1 · sagittal · 4.0mm · 1.02mm/px · 6 of 17 slices shown (1 of 2)]
[im 1/17]
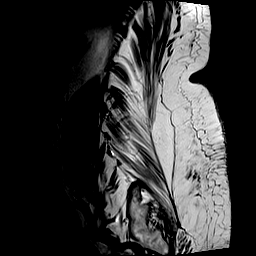
[im 4/17]
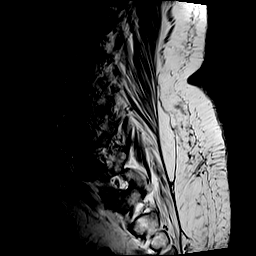
[im 7/17]
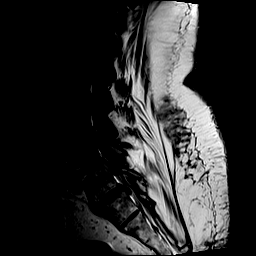
[im 10/17]
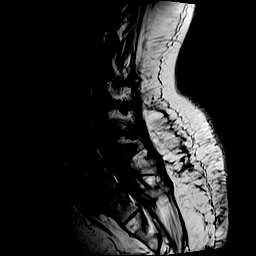
[im 13/17]
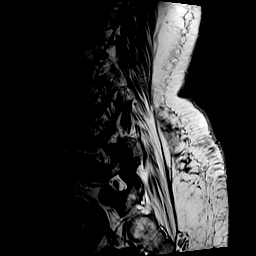
[im 17/17]
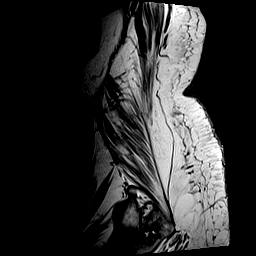

[Series 7: T2 · axial · 4.0mm · 0.39mm/px · z∈[-112,+103]mm · 9 of 44 slices shown (2 of 2)]
[im 1/44]
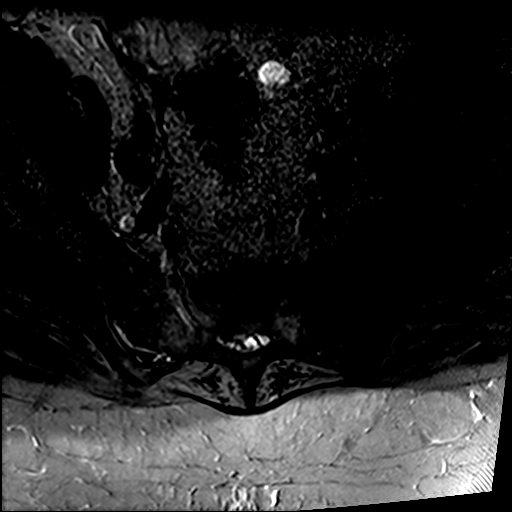
[im 7/44]
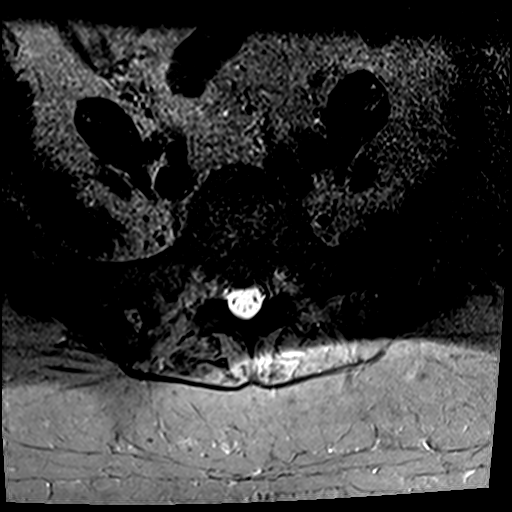
[im 13/44]
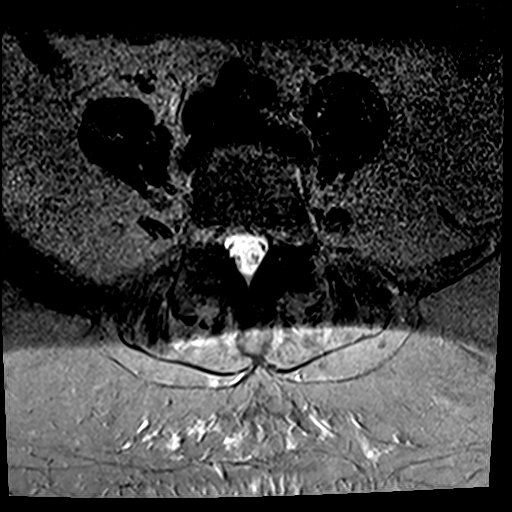
[im 19/44]
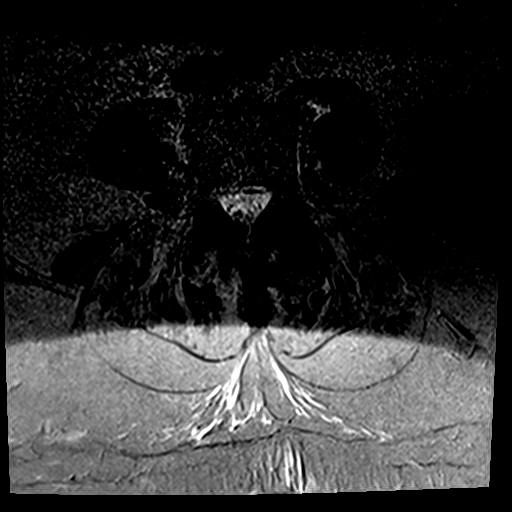
[im 22/44]
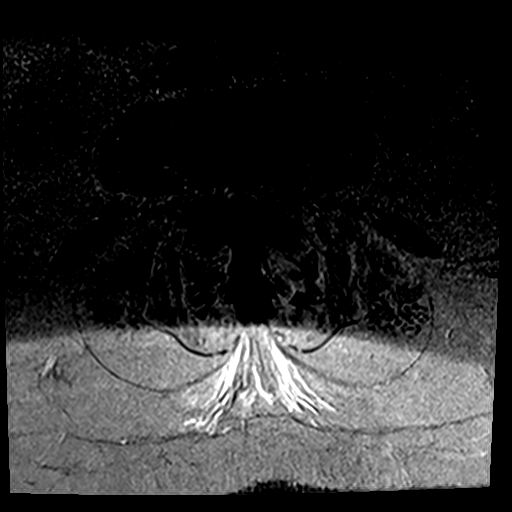
[im 25/44]
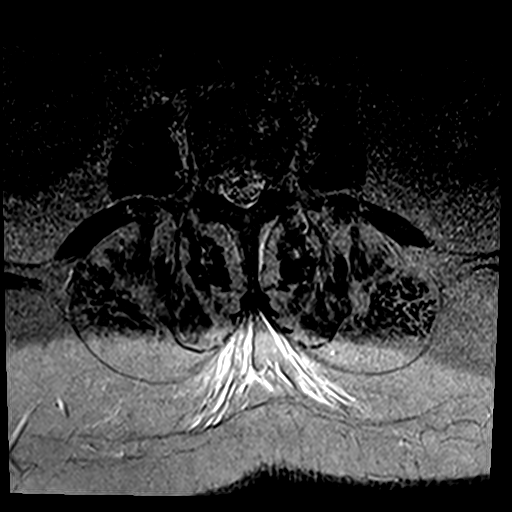
[im 31/44]
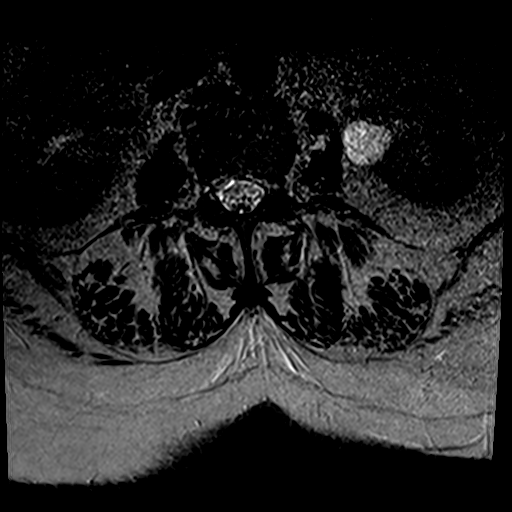
[im 37/44]
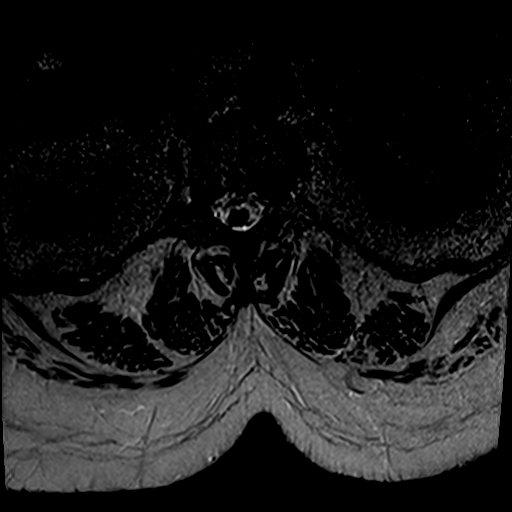
[im 44/44]
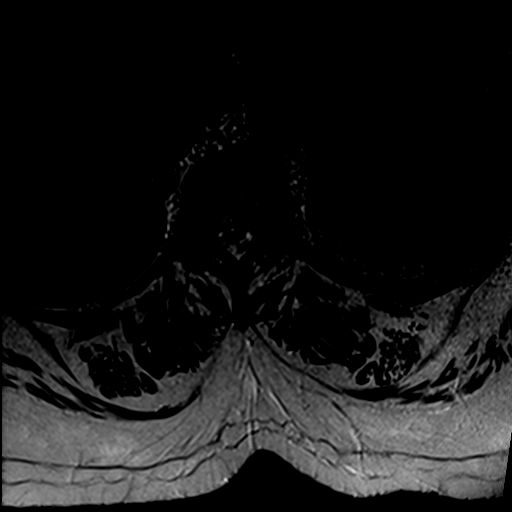

[Series 8: T1 · axial · 4.0mm · 0.39mm/px · z∈[-112,+68]mm · 6 of 44 slices shown (2 of 2)]
[im 1/44]
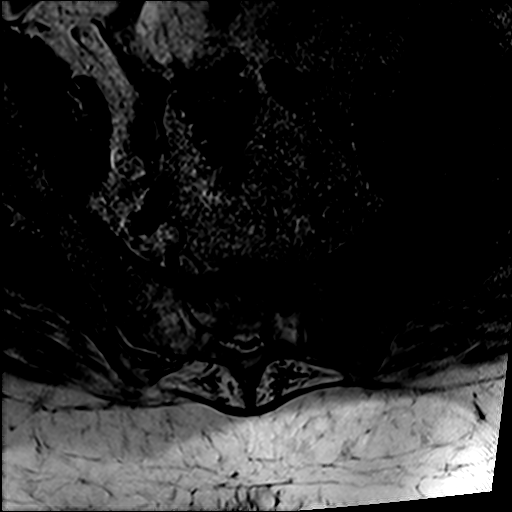
[im 7/44]
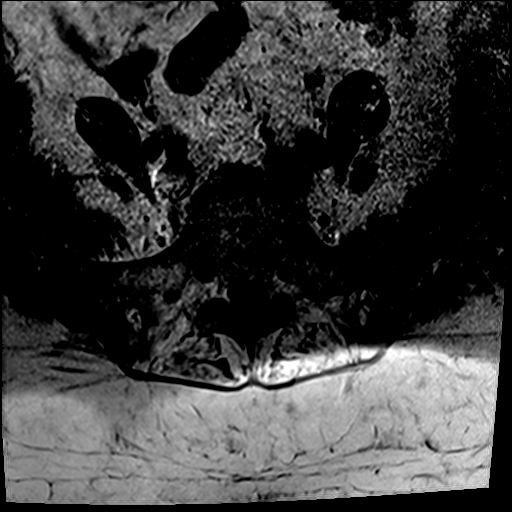
[im 13/44]
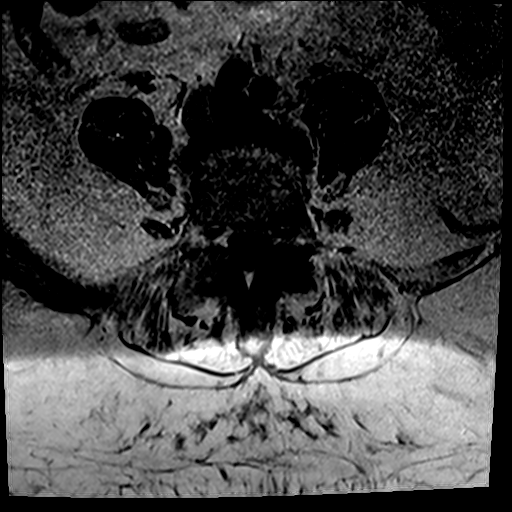
[im 19/44]
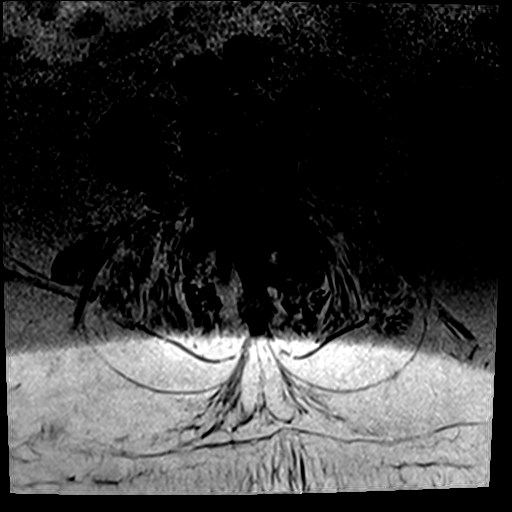
[im 22/44]
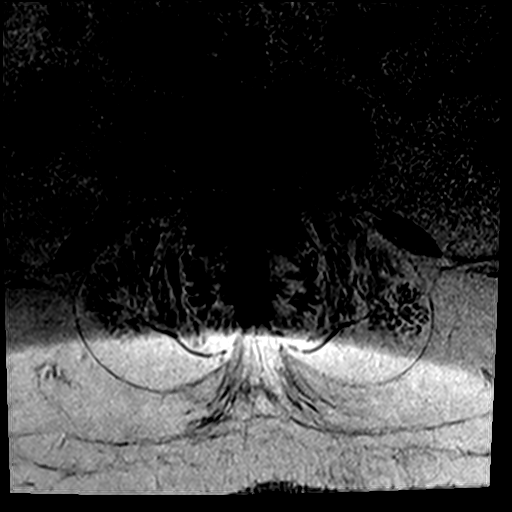
[im 37/44]
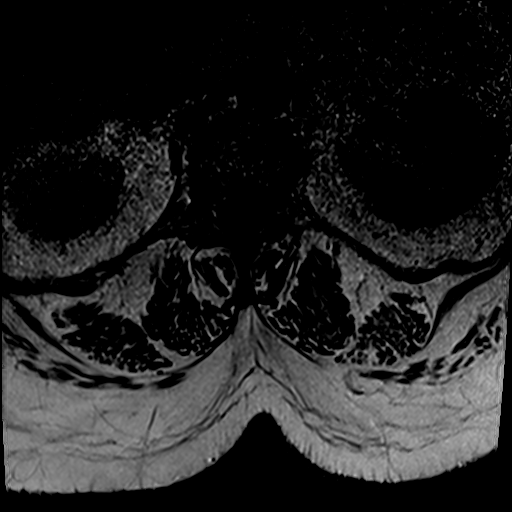

[27 of 48 positions shown; findings below may reference images not displayed]

FINDINGS: Segmentation: There may be transitional anatomy at the lumbosacral
junction. For the purposes of this dictation, L5-S1 is the most
caudal fully formed intervertebral disc.

Alignment:  Grade 1 anterolisthesis at L4-L5.

Vertebrae: Vertebral body heights are maintained. Probable small
vertebral body hemangioma at L3. No substantial marrow edema. No
suspicious osseous lesion.

Conus medullaris and cauda equina: Conus extends to the L2 level.
Conus and cauda equina appear normal.

Paraspinal and other soft tissues: Unremarkable.

Disc levels:

T12-L1: Broad-based central disc protrusion. No significant canal or
foraminal stenosis.

L1-L2: Disc bulge.  No significant canal or foraminal stenosis.

L2-L3: Disc bulge. Mild facet arthropathy with ligamentum flavum
infolding. No significant canal or foraminal stenosis.

L3-L4: Disc bulge. Moderate facet arthropathy with ligamentum flavum
infolding. Probable mild to moderate canal stenosis. No significant
foraminal stenosis.

L4-L5: Mild right and marked left facet arthropathy with ligamentum
flavum infolding. No significant canal or foraminal stenosis.

L5-S1: Disc bulge. Moderate facet arthropathy with facet fusion on
the right. No significant canal or foraminal stenosis.
IMPRESSION: Multilevel degenerative changes as detailed above. Suboptimal
stenosis evaluation due to motion artifact. Canal stenosis is
greatest at L3-L4, probably mild to moderate. There is no
significant foraminal narrowing. Lower lumbar facet arthropathy,
greatest on the left at L4-L5.

## 2022-04-11 ENCOUNTER — Encounter (HOSPITAL_COMMUNITY): Payer: Self-pay | Admitting: Surgery

## 2022-04-11 DIAGNOSIS — E210 Primary hyperparathyroidism: Secondary | ICD-10-CM | POA: Diagnosis present

## 2022-04-11 NOTE — H&P (Signed)
REFERRING PHYSICIAN: Leilani Able*  PROVIDER: Kaylan Yates Charlotta Newton, MD   Chief Complaint: New Consultation (Primary hyperparathyroidism)  History of Present Illness:  Patient is referred by Dr. Glendale Chard for surgical evaluation and management of suspected primary hyperparathyroidism. Patient has a longstanding history of hypercalcemia dating back many years. Patient does have a distant history of nephrolithiasis. She denies any history of osteoporosis. She does note moderate fatigue. She complains of shoulder pain and urinary frequency. Patient has had no prior head or neck surgery. Recent laboratory studies show a serum calcium level of 10.8 with a level in December 2022 as high as 11.0. Recent intact PTH level was elevated at 81. Patient underwent nuclear medicine parathyroid scan on January 15, 2022. 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 Patient has not had additional imaging studies. She presents today for evaluation accompanied by her daughter.  Review of Systems: A complete review of systems was obtained from the patient. I have reviewed this information and discussed as appropriate with the patient. See HPI as well for other ROS.  Review of Systems  Constitutional: Positive for malaise/fatigue.  HENT: Negative.  Eyes: Negative.  Respiratory: Negative.  Cardiovascular: Positive for leg swelling.  Gastrointestinal: Negative.  Genitourinary: Positive for frequency.  Musculoskeletal: Negative.  Skin: Negative.  Neurological: Negative.  Endo/Heme/Allergies: Negative.  Psychiatric/Behavioral: Negative.   Medical History: Past Medical History:  Diagnosis Date  Arthritis  Diabetes mellitus without complication (CMS-HCC)  GERD (gastroesophageal reflux disease)  Hyperlipidemia  Hypertension  Thyroid disease   Patient Active Problem List  Diagnosis  Primary  hyperparathyroidism (CMS-HCC)   Past Surgical History:  Procedure Laterality Date  STRABISMUS EYE SURGERY 1995  ARTHROPLASTY TOTAL KNEE 12/29/2012  Breast Surgery  Carpal Tunnel Surgery Bilateral  Right 07/25/2021 Left 02/05/2019  CHOLECYSTECTOMY  HEMORRHOID SURGERY  HYSTERECTOMY  JOINT REPLACEMENT    Allergies  Allergen Reactions  Codeine Nausea And Vomiting  Nitrofurantoin Rash  Tramadol Nausea And Vomiting  Nitrofurantoin Monohyd/M-Cryst Other (See Comments)  Nitrofurantoin Macrocrystal Rash   Current Outpatient Medications on File Prior to Visit  Medication Sig Dispense Refill  albuterol 90 mcg/actuation inhaler Take 2 inhalations by mouth every 6 (six) hours as needed  BENICAR 20 mg tablet  magnesium oxide (MAG-OX) 400 mg (241.3 mg magnesium) tablet TAKE 1 TABLET BY MOUTH EVERY DAY AFTER SUPPER  SYNTHROID 50 mcg tablet  XIIDRA 5 % ophthalmic solution  aspirin (ECOTRIN LOW STRENGTH) 81 MG EC tablet Take 1 tablet every day by oral route.  CRESTOR 10 mg tablet  famotidine (PEPCID) 10 MG tablet Take by mouth  FUROsemide (LASIX) 20 MG tablet  LUMIGAN 0.01 % ophthalmic solution  polyethylene glycol (MIRALAX) packet Take by mouth  pyridoxine, vitamin B6, (VITAMIN B-6) 25 MG tablet Take 25 mg by mouth once daily   No current facility-administered medications on file prior to visit.   History reviewed. No pertinent family history.   Social History   Tobacco Use  Smoking Status Never  Smokeless Tobacco Never    Social History   Socioeconomic History  Marital status: Widowed  Tobacco Use  Smoking status: Never  Smokeless tobacco: Never  Substance and Sexual Activity  Alcohol use: Not Currently  Drug use: Never   Objective:   Vitals:  BP: (!) 160/60  Pulse: 85  Temp: 36.2 C (97.1 F)  SpO2: 98%  Weight: 98.9 kg (218 lb)  Height: 160 cm (5'  3")   Body mass index is 38.62 kg/m.  Physical Exam   GENERAL APPEARANCE Comfortable, no acute  issues Development: normal Gross deformities: none  SKIN Rash, lesions, ulcers: none Induration, erythema: none Nodules: none palpable  EYES Conjunctiva and lids: normal Pupils: equal and reactive  EARS, NOSE, MOUTH, THROAT External ears: no lesion or deformity External nose: no lesion or deformity Hearing: grossly normal  NECK Symmetric: yes Trachea: midline Thyroid: no palpable nodules in the thyroid bed  CHEST Respiratory effort: normal Retraction or accessory muscle use: no Breath sounds: normal bilaterally Rales, rhonchi, wheeze: none  CARDIOVASCULAR Auscultation: regular rhythm, normal rate Murmurs: none Pulses: radial pulse 2+ palpable Lower extremity edema: Moderate bilateral  ABDOMEN Not assessed  GENITOURINARY/RECTAL Not assessed  MUSCULOSKELETAL Station and gait: normal Digits and nails: no clubbing or cyanosis Muscle strength: grossly normal all extremities Range of motion: grossly normal all extremities Deformity: none  LYMPHATIC Cervical: none palpable Supraclavicular: none palpable Lymphedema right upper extremity  PSYCHIATRIC Oriented to person, place, and time: yes Mood and affect: normal for situation Judgment and insight: appropriate for situation   Assessment and Plan:   Primary hyperparathyroidism (CMS-HCC)  Patient is referred by her primary care physician for surgical evaluation and management of suspected primary hyperparathyroidism.  Patient provided with a copy of "Parathyroid Surgery: Treatment for Your Parathyroid Gland Problem", published by [employer], 12 pages. Book reviewed and explained to patient during visit today.  Patient has biochemical evidence of primary hyperparathyroidism. Imaging studies include a nuclear medicine parathyroid scan with sestamibi. 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 I would like to obtain an ultrasound examination of the neck in hopes of confirming the location of the  parathyroid adenoma and ruling out any concurrent thyroid disease. We will make arrangements for that study in the next week or so.  Today we discussed minimally invasive parathyroidectomy. We discussed the size and location of the surgical incision. We discussed doing this as an outpatient surgical procedure. We discussed the risk and benefits of surgery including the risk of recurrent laryngeal nerve injury. Discussed her postoperative recovery and return to normal activities. The patient understands and wishes to proceed in the near future.  We will obtain her ultrasound examination and review the results. We will then refer her to our schedulers to contact the patient regarding scheduling her surgery.  Armandina Gemma, MD [facility] A [place] practice Office: [phone]

## 2022-04-12 ENCOUNTER — Other Ambulatory Visit: Payer: Self-pay

## 2022-04-12 ENCOUNTER — Encounter (HOSPITAL_COMMUNITY): Payer: Self-pay | Admitting: Surgery

## 2022-04-12 ENCOUNTER — Encounter (HOSPITAL_COMMUNITY)
Admission: RE | Disposition: A | Discharge status: Home / Self Care | Payer: Self-pay | Source: Ambulatory Visit | Attending: Surgery

## 2022-04-12 ENCOUNTER — Ambulatory Visit (HOSPITAL_COMMUNITY)
Admission: RE | Admit: 2022-04-12 | Discharge: 2022-04-12 | Disposition: A | Discharge status: Home / Self Care | Payer: Medicare Other | Source: Ambulatory Visit | Attending: Surgery | Admitting: Surgery

## 2022-04-12 ENCOUNTER — Ambulatory Visit (HOSPITAL_BASED_OUTPATIENT_CLINIC_OR_DEPARTMENT_OTHER): Payer: Medicare Other | Admitting: Anesthesiology

## 2022-04-12 ENCOUNTER — Ambulatory Visit (HOSPITAL_COMMUNITY): Payer: Medicare Other | Admitting: Physician Assistant

## 2022-04-12 DIAGNOSIS — E210 Primary hyperparathyroidism: Secondary | ICD-10-CM | POA: Diagnosis present

## 2022-04-12 DIAGNOSIS — I10 Essential (primary) hypertension: Secondary | ICD-10-CM | POA: Insufficient documentation

## 2022-04-12 DIAGNOSIS — M25519 Pain in unspecified shoulder: Secondary | ICD-10-CM | POA: Insufficient documentation

## 2022-04-12 DIAGNOSIS — E119 Type 2 diabetes mellitus without complications: Secondary | ICD-10-CM | POA: Diagnosis not present

## 2022-04-12 DIAGNOSIS — R350 Frequency of micturition: Secondary | ICD-10-CM | POA: Insufficient documentation

## 2022-04-12 DIAGNOSIS — E039 Hypothyroidism, unspecified: Secondary | ICD-10-CM | POA: Diagnosis not present

## 2022-04-12 DIAGNOSIS — D351 Benign neoplasm of parathyroid gland: Secondary | ICD-10-CM | POA: Diagnosis not present

## 2022-04-12 DIAGNOSIS — Z79899 Other long term (current) drug therapy: Secondary | ICD-10-CM | POA: Diagnosis not present

## 2022-04-12 DIAGNOSIS — Z87442 Personal history of urinary calculi: Secondary | ICD-10-CM | POA: Insufficient documentation

## 2022-04-12 DIAGNOSIS — K219 Gastro-esophageal reflux disease without esophagitis: Secondary | ICD-10-CM | POA: Diagnosis not present

## 2022-04-12 HISTORY — PX: PARATHYROIDECTOMY: SHX19

## 2022-04-12 LAB — GLUCOSE, CAPILLARY
Glucose-Capillary: 127 mg/dL — ABNORMAL HIGH (ref 70–99)
Glucose-Capillary: 123 mg/dL — ABNORMAL HIGH (ref 70–99)

## 2022-04-12 SURGERY — PARATHYROIDECTOMY
Anesthesia: General | Site: Neck | Laterality: Right

## 2022-04-12 MED ORDER — PHENYLEPHRINE 80 MCG/ML (10ML) SYRINGE FOR IV PUSH (FOR BLOOD PRESSURE SUPPORT)
PREFILLED_SYRINGE | INTRAVENOUS | Status: DC | PRN
  Administered 2022-04-12 (×2): 80 ug via INTRAVENOUS

## 2022-04-12 MED ORDER — SUGAMMADEX SODIUM 200 MG/2ML IV SOLN
INTRAVENOUS | Status: DC | PRN
  Administered 2022-04-12: 190 mg via INTRAVENOUS

## 2022-04-12 MED ORDER — ROCURONIUM BROMIDE 10 MG/ML (PF) SYRINGE
PREFILLED_SYRINGE | INTRAVENOUS | Status: AC
  Filled 2022-04-12: qty 10

## 2022-04-12 MED ORDER — LACTATED RINGERS IV SOLN: INTRAVENOUS | Status: DC

## 2022-04-12 MED ORDER — MIDAZOLAM HCL 2 MG/2ML IJ SOLN: 0.5000 mg | Freq: Once | INTRAMUSCULAR | Status: DC | PRN

## 2022-04-12 MED ORDER — BUPIVACAINE HCL (PF) 0.5 % IJ SOLN
INTRAMUSCULAR | Status: AC
  Filled 2022-04-12: qty 30

## 2022-04-12 MED ORDER — ONDANSETRON HCL 4 MG/2ML IJ SOLN
INTRAMUSCULAR | Status: DC | PRN
  Administered 2022-04-12: 4 mg via INTRAVENOUS

## 2022-04-12 MED ORDER — PROPOFOL 10 MG/ML IV BOLUS
INTRAVENOUS | Status: DC | PRN
  Administered 2022-04-12: 50 mg via INTRAVENOUS
  Administered 2022-04-12: 40 mg via INTRAVENOUS
  Administered 2022-04-12: 25 mg via INTRAVENOUS
  Administered 2022-04-12: 100 mg via INTRAVENOUS

## 2022-04-12 MED ORDER — PROMETHAZINE HCL 25 MG/ML IJ SOLN: 6.2500 mg | INTRAMUSCULAR | Status: DC | PRN

## 2022-04-12 MED ORDER — ORAL CARE MOUTH RINSE: 15.0000 mL | Freq: Once | OROMUCOSAL | Status: AC

## 2022-04-12 MED ORDER — ONDANSETRON HCL 4 MG/2ML IJ SOLN
INTRAMUSCULAR | Status: AC
  Filled 2022-04-12: qty 2

## 2022-04-12 MED ORDER — BUPIVACAINE HCL 0.5 % IJ SOLN
INTRAMUSCULAR | Status: DC | PRN
  Administered 2022-04-12: 10 mL

## 2022-04-12 MED ORDER — LIDOCAINE HCL (PF) 2 % IJ SOLN
INTRAMUSCULAR | Status: AC
  Filled 2022-04-12: qty 5

## 2022-04-12 MED ORDER — DEXAMETHASONE SODIUM PHOSPHATE 10 MG/ML IJ SOLN
INTRAMUSCULAR | Status: AC
  Filled 2022-04-12: qty 1

## 2022-04-12 MED ORDER — FENTANYL CITRATE (PF) 100 MCG/2ML IJ SOLN
INTRAMUSCULAR | Status: AC
  Filled 2022-04-12: qty 2

## 2022-04-12 MED ORDER — ROCURONIUM BROMIDE 10 MG/ML (PF) SYRINGE
PREFILLED_SYRINGE | INTRAVENOUS | Status: DC | PRN
  Administered 2022-04-12: 50 mg via INTRAVENOUS

## 2022-04-12 MED ORDER — FENTANYL CITRATE PF 50 MCG/ML IJ SOSY
25.0000 ug | PREFILLED_SYRINGE | INTRAMUSCULAR | Status: DC | PRN
  Administered 2022-04-12 (×2): 25 ug via INTRAVENOUS

## 2022-04-12 MED ORDER — CHLORHEXIDINE GLUCONATE CLOTH 2 % EX PADS: 6.0000 | MEDICATED_PAD | Freq: Once | CUTANEOUS | Status: DC

## 2022-04-12 MED ORDER — CHLORHEXIDINE GLUCONATE 0.12 % MT SOLN
15.0000 mL | Freq: Once | OROMUCOSAL | Status: AC
  Administered 2022-04-12: 15 mL via OROMUCOSAL

## 2022-04-12 MED ORDER — DEXAMETHASONE SODIUM PHOSPHATE 10 MG/ML IJ SOLN
INTRAMUSCULAR | Status: DC | PRN
  Administered 2022-04-12: 10 mg via INTRAVENOUS

## 2022-04-12 MED ORDER — FENTANYL CITRATE (PF) 100 MCG/2ML IJ SOLN
INTRAMUSCULAR | Status: DC | PRN
  Administered 2022-04-12 (×2): 50 ug via INTRAVENOUS

## 2022-04-12 MED ORDER — MEPERIDINE HCL 50 MG/ML IJ SOLN: 6.2500 mg | INTRAMUSCULAR | Status: DC | PRN

## 2022-04-12 MED ORDER — HEMOSTATIC AGENTS (NO CHARGE) OPTIME
TOPICAL | Status: DC | PRN
  Administered 2022-04-12: 1

## 2022-04-12 MED ORDER — OXYCODONE HCL 5 MG/5ML PO SOLN: 5.0000 mg | Freq: Once | ORAL | Status: DC | PRN

## 2022-04-12 MED ORDER — HYDROCODONE-ACETAMINOPHEN 5-325 MG PO TABS: 1.0000 | ORAL_TABLET | Freq: Four times a day (QID) | ORAL | 0 refills | Status: DC | PRN

## 2022-04-12 MED ORDER — 0.9 % SODIUM CHLORIDE (POUR BTL) OPTIME
TOPICAL | Status: DC | PRN
  Administered 2022-04-12: 1000 mL

## 2022-04-12 MED ORDER — OXYCODONE HCL 5 MG PO TABS: 5.0000 mg | ORAL_TABLET | Freq: Once | ORAL | Status: DC | PRN

## 2022-04-12 MED ORDER — FENTANYL CITRATE PF 50 MCG/ML IJ SOSY
PREFILLED_SYRINGE | INTRAMUSCULAR | Status: AC
  Filled 2022-04-12: qty 1

## 2022-04-12 MED ORDER — LIDOCAINE 2% (20 MG/ML) 5 ML SYRINGE
INTRAMUSCULAR | Status: DC | PRN
  Administered 2022-04-12: 40 mg via INTRAVENOUS

## 2022-04-12 MED ORDER — CEFAZOLIN SODIUM-DEXTROSE 2-4 GM/100ML-% IV SOLN
2.0000 g | INTRAVENOUS | Status: AC
  Administered 2022-04-12: 2 g via INTRAVENOUS
  Filled 2022-04-12: qty 100

## 2022-04-12 SURGICAL SUPPLY — 34 items
APL PRP STRL LF DISP 70% ISPRP (MISCELLANEOUS) ×1
ATTRACTOMAT 16X20 MAGNETIC DRP (DRAPES) ×1 IMPLANT
BAG COUNTER SPONGE SURGICOUNT (BAG) ×1 IMPLANT
BAG SPNG CNTER NS LX DISP (BAG) ×1
BLADE SURG 15 STRL LF DISP TIS (BLADE) ×1 IMPLANT
BLADE SURG 15 STRL SS (BLADE) ×1
CHLORAPREP W/TINT 26 (MISCELLANEOUS) ×1 IMPLANT
CLIP TI MEDIUM 6 (CLIP) ×2 IMPLANT
CLIP TI WIDE RED SMALL 6 (CLIP) ×2 IMPLANT
COVER SURGICAL LIGHT HANDLE (MISCELLANEOUS) ×1 IMPLANT
DERMABOND ADVANCED .7 DNX12 (GAUZE/BANDAGES/DRESSINGS) ×1 IMPLANT
DRAPE LAPAROTOMY T 98X78 PEDS (DRAPES) ×1 IMPLANT
DRAPE UTILITY XL STRL (DRAPES) ×1 IMPLANT
ELECT REM PT RETURN 15FT ADLT (MISCELLANEOUS) ×1 IMPLANT
GAUZE 4X4 16PLY ~~LOC~~+RFID DBL (SPONGE) ×1 IMPLANT
GLOVE SURG ORTHO 8.0 STRL STRW (GLOVE) ×1 IMPLANT
GOWN STRL REUS W/ TWL XL LVL3 (GOWN DISPOSABLE) ×3 IMPLANT
GOWN STRL REUS W/TWL XL LVL3 (GOWN DISPOSABLE) ×3
HEMOSTAT SURGICEL 2X4 FIBR (HEMOSTASIS) ×1 IMPLANT
ILLUMINATOR WAVEGUIDE N/F (MISCELLANEOUS) IMPLANT
KIT BASIN OR (CUSTOM PROCEDURE TRAY) ×1 IMPLANT
KIT TURNOVER KIT A (KITS) IMPLANT
NDL HYPO 25X1 1.5 SAFETY (NEEDLE) ×1 IMPLANT
NEEDLE HYPO 25X1 1.5 SAFETY (NEEDLE) ×1 IMPLANT
PACK BASIC VI WITH GOWN DISP (CUSTOM PROCEDURE TRAY) ×1 IMPLANT
PENCIL SMOKE EVACUATOR (MISCELLANEOUS) ×1 IMPLANT
SHEARS HARMONIC 9CM CVD (BLADE) IMPLANT
SUT MNCRL AB 4-0 PS2 18 (SUTURE) ×1 IMPLANT
SUT VIC AB 3-0 SH 18 (SUTURE) ×1 IMPLANT
SYR BULB IRRIG 60ML STRL (SYRINGE) ×1 IMPLANT
SYR CONTROL 10ML LL (SYRINGE) ×1 IMPLANT
TOWEL OR 17X26 10 PK STRL BLUE (TOWEL DISPOSABLE) ×1 IMPLANT
TOWEL OR NON WOVEN STRL DISP B (DISPOSABLE) ×1 IMPLANT
TUBING CONNECTING 10 (TUBING) ×1 IMPLANT

## 2022-04-12 NOTE — Anesthesia Preprocedure Evaluation (Addendum)
Anesthesia Evaluation  Patient identified by MRN, date of birth, ID band Patient awake    Reviewed: Allergy & Precautions, NPO status , Patient's Chart, lab work & pertinent test results  History of Anesthesia Complications Negative for: history of anesthetic complications  Airway Mallampati: II  TM Distance: >3 FB Neck ROM: Full    Dental  (+) Dental Advisory Given   Pulmonary neg pulmonary ROS,    breath sounds clear to auscultation       Cardiovascular hypertension, Pt. on medications (-) angina Rhythm:Regular Rate:Normal  06/2021 Myoview: breast attenuation, otherwise normal myocardial perfusion. Stress LVEF 55%. Low risk study.     Neuro/Psych negative neurological ROS     GI/Hepatic GERD  Medicated and Controlled,  Endo/Other  diabetes (glu 127, diet controlled)Hypothyroidism BMI 37  Renal/GU negative Renal ROS     Musculoskeletal   Abdominal (+) + obese,   Peds  Hematology negative hematology ROS (+)   Anesthesia Other Findings   Reproductive/Obstetrics                            Anesthesia Physical Anesthesia Plan  ASA: 3  Anesthesia Plan: General   Post-op Pain Management: Tylenol PO (pre-op)*   Induction: Intravenous  PONV Risk Score and Plan: 3 and Ondansetron, Dexamethasone and Treatment may vary due to age or medical condition  Airway Management Planned: Oral ETT  Additional Equipment: None  Intra-op Plan:   Post-operative Plan: Extubation in OR  Informed Consent: I have reviewed the patients History and Physical, chart, labs and discussed the procedure including the risks, benefits and alternatives for the proposed anesthesia with the patient or authorized representative who has indicated his/her understanding and acceptance.     Dental advisory given  Plan Discussed with: CRNA and Surgeon  Anesthesia Plan Comments:        Anesthesia Quick  Evaluation

## 2022-04-12 NOTE — Anesthesia Procedure Notes (Signed)
Procedure Name: Intubation Date/Time: 04/12/2022 10:24 AM  Performed by: Bonney Aid, CRNAPre-anesthesia Checklist: Patient identified, Emergency Drugs available, Suction available and Patient being monitored Patient Re-evaluated:Patient Re-evaluated prior to induction Oxygen Delivery Method: Circle system utilized Preoxygenation: Pre-oxygenation with 100% oxygen Induction Type: IV induction Ventilation: Mask ventilation without difficulty Laryngoscope Size: Mac Grade View: Grade III Tube type: Oral Tube size: 7.0 mm Number of attempts: 1 Airway Equipment and Method: Stylet Placement Confirmation: ETT inserted through vocal cords under direct vision, positive ETCO2 and breath sounds checked- equal and bilateral Secured at: 21 cm Tube secured with: Tape Dental Injury: Teeth and Oropharynx as per pre-operative assessment

## 2022-04-12 NOTE — Transfer of Care (Signed)
Immediate Anesthesia Transfer of Care Note  Patient: [name]  Procedure(s) Performed: RIGHT INFERIOR PARATHYROIDECTOMY (Right: Neck)  Patient Location: PACU  Anesthesia Type:General  Level of Consciousness: drowsy  Airway & Oxygen Therapy: Patient Spontanous Breathing and Patient connected to face mask oxygen  Post-op Assessment: Report given to RN  Post vital signs: Reviewed and stable  Last Vitals:  Vitals Value Taken Time  BP 145/64 04/12/22 1130  Temp    Pulse 70 04/12/22 1130  Resp 13 04/12/22 1130  SpO2 100 % 04/12/22 1130  Vitals shown include unvalidated device data.  Last Pain:  Vitals:   04/12/22 0831  TempSrc:   PainSc: 0-No pain         Complications: No notable events documented.

## 2022-04-12 NOTE — Discharge Instructions (Addendum)
[facility] - Dr. Todd Gerkin  THYROID & PARATHYROID SURGERY:  POST-OP INSTRUCTIONS  Always review the instruction sheet provided by the hospital nurse at discharge.  A prescription for pain medication may be sent to your pharmacy at the time of discharge.  Take your pain medication as prescribed.  If narcotic pain medicine is not needed, then you may take acetaminophen (Tylenol) or ibuprofen (Advil) as needed for pain or soreness.  Take your normal home medications as prescribed unless otherwise directed.  If you need a refill on your pain medication, please contact the office during regular business hours.  Prescriptions will not be processed by the office after 5:00PM or on weekends.  Start with a light diet upon arrival home, such as soup and crackers or toast.  Be sure to drink plenty of fluids.  Resume your normal diet the day after surgery.  Most patients will experience some swelling and bruising on the chest and neck area.  Ice packs will help for the first 48 hours after arriving home.  Swelling and bruising will take several days to resolve.   It is common to experience some constipation after surgery.  Increasing fluid intake and taking a stool softener (Colace) will usually help to prevent this problem.  A mild laxative (Milk of Magnesia or Miralax) should be taken according to package directions if there has been no bowel movement after 48 hours.  Dermabond glue covers your incision. This seals the wound and you may shower at any time. The Dermabond will remain in place for about a week.  You may gradually remove the glue when it loosens around the edges.  If you need to loosen the Dermabond for removal, apply a layer of Vaseline to the wound for 15 minutes and then remove with a Kleenex. Your sutures are under the skin and will not show - they will dissolve on their own.  You may resume light daily activities beginning the day after discharge (such as self-care,  walking, climbing stairs), gradually increasing activities as tolerated. You may have sexual intercourse when it is comfortable. Refrain from any heavy lifting or straining until approved by your doctor. You may drive when you no longer are taking prescription pain medication, you can comfortably wear a seatbelt, and you can safely maneuver your car and apply the brakes.  You will see your doctor in the office for a follow-up appointment approximately three weeks after your surgery.  Make sure that you call for this appointment within a day or two after you arrive home to insure a convenient appointment time. Please have any requested laboratory tests performed a few days prior to your office visit so that the results will be available at your follow up appointment.  WHEN TO CALL THE [facility] OFFICE: -- Fever greater than 101.5 -- Inability to urinate -- Nausea and/or vomiting - persistent -- Extreme swelling or bruising -- Continued bleeding from incision -- Increased pain, redness, or drainage from the incision -- Difficulty swallowing or breathing -- Muscle cramping or spasms -- Numbness or tingling in hands or around lips  The clinic staff is available to answer your questions during regular business hours.  Please don't hesitate to call and ask to speak to one of the nurses if you have concerns.  [facility] OFFICE: [phone] (24 hours)  Please sign up for MyChart accounts. This will allow you to communicate directly with my nurse or myself without having to call the office. It will also allow you   to view your test results. You will need to enroll in MyChart for my office ([facility]) and for the hospital ().  Todd Gerkin, MD [facility] practice 

## 2022-04-12 NOTE — Interval H&P Note (Signed)
History and Physical Interval Note:  04/12/2022 9:53 AM  [name]  has presented today for surgery, with the diagnosis of PRIMARY HYERPARATHYROIDISM.  The various methods of treatment have been discussed with the patient and family. After consideration of risks, benefits and other options for treatment, the patient has consented to    Procedure(s): RIGHT INFERIOR PARATHYROIDECTOMY (Right) as a surgical intervention.    The patient's history has been reviewed, patient examined, no change in status, stable for surgery.  I have reviewed the patient's chart and labs.  Questions were answered to the patient's satisfaction.    Armandina Gemma, [facility] A [place] practice Office: [place]

## 2022-04-12 NOTE — Op Note (Signed)
OPERATIVE REPORT - PARATHYROIDECTOMY  Preoperative diagnosis: Primary hyperparathyroidism  Postop diagnosis: Same  Procedure: Right inferior minimally invasive parathyroidectomy  Surgeon:  Armandina Gemma, MD  Anesthesia: General endotracheal  Estimated blood loss: Minimal  Preparation: ChloraPrep  Indications: Patient is referred by Dr. Glendale Chard for surgical evaluation and management of suspected primary hyperparathyroidism. Patient has a longstanding history of hypercalcemia dating back many years. Patient does have a distant history of nephrolithiasis. She denies any history of osteoporosis. She does note moderate fatigue. She complains of shoulder pain and urinary frequency. Patient has had no prior head or neck surgery. Recent laboratory studies show a serum calcium level of 10.8 with a level in December 2022 as high as 11.0. Recent intact PTH level was elevated at 81. Patient underwent nuclear medicine parathyroid scan on January 15, 2022. 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 Patient now comes to surgery for parathyroidectomy.  Procedure: The patient was prepared in the pre-operative holding area. The patient was brought to the operating room and placed in a supine position on the operating room table. Following administration of general anesthesia, the patient was positioned and then prepped and draped in the usual strict aseptic fashion. After ascertaining that an adequate level of anesthesia been achieved, a neck incision was made with a #15 blade. Dissection was carried through subcutaneous tissues and platysma. Hemostasis was obtained with the electrocautery. Skin flaps were developed circumferentially and a Weitlander retractor was placed for exposure.  Strap muscles were incised in the midline. Strap muscles were reflected laterally exposing the thyroid lobe. With gentle blunt  dissection the thyroid lobe was mobilized.  Dissection was carried posteriorly and an enlarged parathyroid gland was identified. It was gently mobilized. Vascular structures were divided between small ligaclips. Care was taken to avoid the recurrent laryngeal nerve which was immediately adjacent. The parathyroid gland was completely excised. It was submitted to pathology where frozen section confirmed hypercellular parathyroid tissue consistent with adenoma.  Neck was irrigated with warm saline and good hemostasis was noted. Fibrillar was placed in the operative field. Strap muscles were approximated in the midline with interrupted 3-0 Vicryl sutures. Platysma was closed with interrupted 3-0 Vicryl sutures. Marcaine was infiltrated circumferentially. Skin was closed with a running 4-0 Monocryl subcuticular suture. Wound was washed and dried and Dermabond was applied. Patient was awakened from anesthesia and brought to the recovery room. The patient tolerated the procedure well.   Armandina Gemma, [place] [facility] Office: [phone]

## 2022-04-12 NOTE — Anesthesia Postprocedure Evaluation (Signed)
Anesthesia Post Note  Patient: [name]  Procedure(s) Performed: RIGHT INFERIOR PARATHYROIDECTOMY (Right: Neck)     Patient location during evaluation: Phase II Anesthesia Type: General Level of consciousness: awake and alert, patient cooperative and oriented Pain management: pain level controlled Vital Signs Assessment: post-procedure vital signs reviewed and stable Respiratory status: spontaneous breathing, nonlabored ventilation and respiratory function stable Cardiovascular status: blood pressure returned to baseline and stable Postop Assessment: no apparent nausea or vomiting, able to ambulate and adequate PO intake Anesthetic complications: no   No notable events documented.  Last Vitals:  Vitals:   04/12/22 1315 04/12/22 1330  BP: (!) 168/85 (!) 181/92  Pulse: 69 71  Resp: (!) 23 19  Temp:    SpO2: 96% 97%    Last Pain:  Vitals:   04/12/22 1330  TempSrc:   PainSc: Asleep                 Carol Theys,E. Juri Dinning

## 2022-04-13 ENCOUNTER — Encounter (HOSPITAL_COMMUNITY): Payer: Self-pay | Admitting: Surgery

## 2022-04-13 LAB — SURGICAL PATHOLOGY

## 2022-04-13 NOTE — Progress Notes (Signed)
Benign parathyroid adenoma as expected.  tmg  [name], MD [facility] practice Office: [phone] 

## 2022-04-17 ENCOUNTER — Ambulatory Visit: Payer: Medicare Other

## 2022-04-17 VITALS — BP 130/78 | HR 71 | Temp 97.5°F

## 2022-04-17 DIAGNOSIS — I10 Essential (primary) hypertension: Secondary | ICD-10-CM

## 2022-04-17 MED ORDER — OLMESARTAN MEDOXOMIL 40 MG PO TABS: 40.0000 mg | ORAL_TABLET | Freq: Every day | ORAL | 1 refills | Status: DC

## 2022-04-17 NOTE — Progress Notes (Signed)
Patient presents today for BPC. She is currently taking benicar '20mg'$  and lasix '20mg'$ . She did bring her person cuff today and received a reading of 144/76.  BP Readings from Last 3 Encounters:  04/17/22 130/78  04/12/22 (!) 181/92  03/30/22 (!) 142/69   Provider would like for patient to increase Benicar to '40mg'$ . Refill sent to [facility].

## 2022-04-19 NOTE — Progress Notes (Signed)
      __Agree with the path finding of adenoma  Armandina Gemma, MD [facility] A [place] practice Office: [phone]

## 2022-05-02 ENCOUNTER — Ambulatory Visit (INDEPENDENT_AMBULATORY_CARE_PROVIDER_SITE_OTHER): Payer: Medicare Other | Admitting: Internal Medicine

## 2022-05-02 ENCOUNTER — Ambulatory Visit (INDEPENDENT_AMBULATORY_CARE_PROVIDER_SITE_OTHER): Payer: Medicare Other

## 2022-05-02 ENCOUNTER — Encounter: Payer: Self-pay | Admitting: Internal Medicine

## 2022-05-02 VITALS — BP 122/60 | HR 90 | Temp 97.5°F | Ht 63.0 in | Wt 211.8 lb

## 2022-05-02 VITALS — BP 122/60 | HR 90 | Temp 97.5°F | Ht 63.0 in | Wt 211.9 lb

## 2022-05-02 DIAGNOSIS — I10 Essential (primary) hypertension: Secondary | ICD-10-CM | POA: Diagnosis not present

## 2022-05-02 DIAGNOSIS — M7989 Other specified soft tissue disorders: Secondary | ICD-10-CM

## 2022-05-02 DIAGNOSIS — L309 Dermatitis, unspecified: Secondary | ICD-10-CM

## 2022-05-02 DIAGNOSIS — Z0000 Encounter for general adult medical examination without abnormal findings: Secondary | ICD-10-CM | POA: Diagnosis not present

## 2022-05-02 DIAGNOSIS — D751 Secondary polycythemia: Secondary | ICD-10-CM

## 2022-05-02 DIAGNOSIS — Z6837 Body mass index (BMI) 37.0-37.9, adult: Secondary | ICD-10-CM

## 2022-05-02 DIAGNOSIS — E1169 Type 2 diabetes mellitus with other specified complication: Secondary | ICD-10-CM

## 2022-05-02 DIAGNOSIS — R918 Other nonspecific abnormal finding of lung field: Secondary | ICD-10-CM

## 2022-05-02 DIAGNOSIS — E785 Hyperlipidemia, unspecified: Secondary | ICD-10-CM

## 2022-05-02 LAB — CBC
Hematocrit: 46.1 % (ref 34.0–46.6)
Hemoglobin: 15.1 g/dL (ref 11.1–15.9)
MCH: 30 pg (ref 26.6–33.0)
MCHC: 32.8 g/dL (ref 31.5–35.7)
MCV: 92 fL (ref 79–97)
Platelets: 177 10*3/uL (ref 150–450)
RBC: 5.03 x10E6/uL (ref 3.77–5.28)
RDW: 13.3 % (ref 11.7–15.4)
WBC: 4.7 10*3/uL (ref 3.4–10.8)

## 2022-05-02 MED ORDER — TRIAMCINOLONE ACETONIDE 0.1 % EX CREA: TOPICAL_CREAM | CUTANEOUS | 0 refills | Status: AC

## 2022-05-02 NOTE — Progress Notes (Signed)
[name],acting as a Education administrator for Stephanie Greenland, [name].,have documented all relevant documentation on the behalf of Stephanie Greenland, [name],as directed by  Stephanie Greenland, [name] while in the presence of Stephanie Greenland, [name].    Subjective:     Patient ID: [name] , female    DOB: [birth year] , 81 y.o.   MRN: [id number]   Chief Complaint  Patient presents with   Diabetes   Hypertension    HPI  She presents today for BP and DM f/u. She reports compliance with meds. Denies headaches, chest pain and palpitations. 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 She did not have any post-operative complications. She feels much better since having the surgery. She has noticed less aches/pains. She is happy she agreed to the procedure, she was initially hesitant.   She was seen earlier by [facility] for AW[facility].  Diabetes She presents for her follow-up diabetic visit. She has type 2 diabetes mellitus. Her disease course has been stable. There are no hypoglycemic associated symptoms. There are no diabetic associated symptoms. Pertinent negatives for diabetes include no blurred vision, no polydipsia, no polyphagia and no polyuria. There are no hypoglycemic complications. Risk factors for coronary artery disease include diabetes mellitus, dyslipidemia, hypertension, obesity, post-menopausal and sedentary lifestyle. She participates in exercise intermittently. Eye exam is current.  Hypertension This is a chronic problem. The current episode started more than 1 year ago. The problem has been gradually improving since onset. The problem is controlled. Pertinent negatives include no blurred vision. Agents associated with hypertension include thyroid hormones. Risk factors for coronary artery disease include dyslipidemia, obesity, post-menopausal state and sedentary lifestyle. Past treatments include angiotensin blockers. The  current treatment provides moderate improvement. Compliance problems include exercise.      Past Medical History:  Diagnosis Date   Arthritis    RIGHT KNEE PAIN AND OA   Chest pain 05/03/2015   Diabetes mellitus without complication (HCC)    Elevated cholesterol    GERD (gastroesophageal reflux disease)    History of kidney stones    Hypertension    Hypothyroidism    IBS (irritable bowel syndrome)    Localized swelling of both lower legs    Rotator cuff disorder    BOTH SHOULDERS - NO SURGERY - AND STATES LIMITATIONS IN ARM MOVEMENT   Shortness of breath 05/03/2015   Swelling    CHRONIC SWELLING RIGHT HAND AND BOTH FEET AND BOTH LEGS   Vertigo      Family History  Problem Relation Age of Onset   Diabetes Mother    Dementia Mother    Diabetes Father    Diabetes Sister    Breast cancer Sister    Kidney cancer Sister    Lung cancer Sister    Lung cancer Maternal Aunt    Pancreatic cancer Maternal Aunt    Cancer Other      Current Outpatient Medications:    triamcinolone cream (KENALOG) 0.1 %, APPLY TO AFFECTED AREA TWICE DAILY AS NEEDED, Disp: 45 g, Rfl: 0   albuterol (VENTOLIN HFA) 108 (90 Base) MCG/ACT inhaler, Inhale 1 puff into the lungs as needed., Disp: , Rfl:    aspirin EC 81 MG tablet, Take 81 mg by mouth daily. Swallow whole., Disp: , Rfl:    bimatoprost (LUMIGAN) 0.01 % SOLN, Place 1 drop into both eyes at bedtime. , Disp: , Rfl:    Blood Glucose Monitoring  Suppl ([place]) w/Device KIT, USE TO CHECK BLOOD SUGAR 3  TIMES DAILY, Disp: 1 kit, Rfl: 1   Carboxymethylcellulose Sodium (REFRESH LIQUIGEL) 1 % GEL, Place 1 drop into both eyes at bedtime., Disp: , Rfl:    CRESTOR 20 MG tablet, TAKE 1 TABLET BY MOUTH AT  BEDTIME, Disp: 90 tablet, Rfl: 2   famotidine (PEPCID) 20 MG tablet, Take 20 mg by mouth daily with breakfast., Disp: , Rfl:    furosemide (LASIX) 20 MG tablet, TAKE 1 TABLET BY MOUTH IN  THE MORNING, Disp: 90 tablet, Rfl: 3   glucose  blood (ONETOUCH VERIO) test strip, USE AS INSTRUCTED TO CHECK  BLOOD SUGAR 3 TIMES DAILY, Disp: 300 strip, Rfl: 3   HYDROcodone-acetaminophen (NORCO/VICODIN) 5-325 MG tablet, Take 1 tablet by mouth every 6 (six) hours as needed for moderate pain. (Patient not taking: Reported on 05/02/2022), Disp: 15 tablet, Rfl: 0   Lancets (ONETOUCH DELICA PLUS [id number]) MISC, USE ONE LANCET 3 TIMES  DAILY BEFORE MEALS, Disp: 300 each, Rfl: 3   levothyroxine (SYNTHROID) 50 MCG tablet, Take 1 tablet (50 mcg total) by mouth daily., Disp: 30 tablet, Rfl: 11   Lifitegrast (XIIDRA) 5 % SOLN, Place 1 drop into both eyes daily. xiidra, Disp: , Rfl:    magnesium oxide (MAG-OX) 400 (240 Mg) MG tablet, TAKE 1 TABLET BY MOUTH EVERY DAY AFTER SUPPER, Disp: 90 tablet, Rfl: 1   Multiple Vitamins-Minerals (MULTIVITAMIN WITH MINERALS) tablet, Take 1 tablet by mouth daily. Centrum Silver, Disp: , Rfl:    olmesartan (BENICAR) 40 MG tablet, Take 1 tablet (40 mg total) by mouth daily., Disp: 90 tablet, Rfl: 1   OVER THE COUNTER MEDICATION, Apply 1 Application topically daily. Glucosamine  Use on Hand, Disp: , Rfl:    polyethylene glycol (MIRALAX / GLYCOLAX) packet, Take 17 g by mouth daily., Disp: , Rfl:    vitamin B-6 (PYRIDOXINE) 25 MG tablet, Take 25 mg by mouth daily., Disp: , Rfl:    Allergies  Allergen Reactions   Codeine Nausea And Vomiting   Tramadol Nausea And Vomiting   Macrobid [Nitrofurantoin Macrocrystal] Rash     Review of Systems  Constitutional: Negative.   Eyes: Negative.  Negative for blurred vision.  Respiratory: Negative.    Cardiovascular: Negative.   Endocrine: Negative for polydipsia, polyphagia and polyuria.  Musculoskeletal: Negative.   Skin: Negative.   Neurological: Negative.   Psychiatric/Behavioral: Negative.       Today's Vitals   05/02/22 0900  BP: 122/60  Pulse: 90  Temp: (!) 97.5 F (36.4 C)  Weight: 211 lb 13.8 oz (96.1 kg)  Height: $Remove'5\' 3"'dwMpPVs$  (1.6 m)  PainSc: 0-No pain   Body  mass index is 37.53 kg/m.  Wt Readings from Last 3 Encounters:  05/02/22 211 lb 13.8 oz (96.1 kg)  05/02/22 211 lb 12.8 oz (96.1 kg)  04/12/22 210 lb 5.1 oz (95.4 kg)     Objective:  Physical Exam Vitals and nursing note reviewed.  Constitutional:      Appearance: Normal appearance.  HENT:     Head: Normocephalic and atraumatic.     Nose:     Comments: Masked     Mouth/Throat:     Comments: Masked  Eyes:     Extraocular Movements: Extraocular movements intact.  Neck:     Comments: Healing anterior neck surgical scar Cardiovascular:     Rate and Rhythm: Normal rate and regular rhythm.     Heart sounds: Normal heart sounds.  Pulmonary:     Effort: Pulmonary effort is normal.     Breath sounds: Normal breath sounds.  Musculoskeletal:     Cervical back: Normal range of motion.  Skin:    General: Skin is warm.     Findings: Rash present.     Comments: right wrist, hyperpigmented and scaly, sl erythematous  Neurological:     General: No focal deficit present.     Mental Status: She is alert.  Psychiatric:        Mood and Affect: Mood normal.        Behavior: Behavior normal.      Assessment And Plan:     1. Dyslipidemia associated with type 2 diabetes mellitus ([place]) Comments: Chornic, Recent labs reviewed, last a1c 6.6 on 03/30/22.Marland Kitchen She is encouraged to gradually increase her activity level. She agrees to [employer] referral. - Amb Referral To Provider Referral Exercise Program (P.R.E.P)  2. Essential hypertension Comments: Chronic, well controlled. She is encouraged to follow low sodium diet.  - Amb Referral To Provider Referral Exercise Program (P.R.E.P)  3. Dermatitis Comments: I will send rx triamcinolone cream to apply to affected area BID prn. She will let me know if she does not have improvement in her sx.   4. Erythrocytosis Comments: I will check repeat CBC today. She is encouraged to stay well hydrated.  - CBC no Diff  5. Multiple lung nodules Comments: Needs  f/u Chest Ct in Nov 2023. She agrees to repeat study.  - CT Chest Wo Contrast; Future  6. Class 2 severe obesity due to excess calories with serious comorbidity and body mass index (BMI) of 37.0 to 37.9 in adult [facility]) Comments: She is encouraged to gradually increase her daily activity, aiming for at least 150 minutes of exercise per week.  - Amb Referral To Provider Referral Exercise Program (P.R.E.P)   Patient was given opportunity to ask questions. Patient verbalized understanding of the plan and was able to repeat key elements of the plan. All questions were answered to their satisfaction.   I, Stephanie Greenland, [name], have reviewed all documentation for this visit. The documentation on 05/02/22 for the exam, diagnosis, procedures, and orders are all accurate and complete.   IF YOU HAVE BEEN REFERRED TO A SPECIALIST, IT MAY TAKE 1-2 WEEKS TO SCHEDULE/PROCESS THE REFERRAL. IF YOU HAVE NOT HEARD FROM US/SPECIALIST IN TWO WEEKS, PLEASE GIVE [place] A CALL AT [phone] X [phone].   THE PATIENT IS ENCOURAGED TO PRACTICE SOCIAL DISTANCING DUE TO THE COVID-19 PANDEMIC.

## 2022-05-02 NOTE — Patient Instructions (Signed)
[name] , Thank you for taking time to come for your Medicare Wellness Visit. I appreciate your ongoing commitment to your health goals. Please review the following plan we discussed and let me know if I can assist you in the future.   Screening recommendations/referrals: Colonoscopy: not required Mammogram: completed 12/25/2021, due 12/27/2022 Bone Density: completed 12/20/2000 Recommended yearly ophthalmology/optometry visit for glaucoma screening and checkup Recommended yearly dental visit for hygiene and checkup  Vaccinations: Influenza vaccine: completed 04/23/2022 Pneumococcal vaccine: completed 04/28/2018 Tdap vaccine: completed 08/15/2016, due 08/15/2026 Shingles vaccine: completed   Covid-19: 06/06/2021, 10/20/2020, 05/14/2020, 08/17/2019, 07/20/2019  Advanced directives: copy in chart  Conditions/risks identified: none  Next appointment: Follow up in one year for your annual wellness visit    Preventive Care 20 Years and Older, Female Preventive care refers to lifestyle choices and visits with your health care provider that can promote health and wellness. What does preventive care include? A yearly physical exam. This is also called an annual well check. Dental exams once or twice a year. Routine eye exams. Ask your health care provider how often you should have your eyes checked. Personal lifestyle choices, including: Daily care of your teeth and gums. Regular physical activity. Eating a healthy diet. Avoiding tobacco and drug use. Limiting alcohol use. Practicing safe sex. Taking low-dose aspirin every day. Taking vitamin and mineral supplements as recommended by your health care provider. What happens during an annual well check? The services and screenings done by your health care provider during your annual well check will depend on your age, overall health, lifestyle risk factors, and family history of disease. Counseling  Your health care provider may ask you  questions about your: Alcohol use. Tobacco use. Drug use. Emotional well-being. Home and relationship well-being. Sexual activity. Eating habits. History of falls. Memory and ability to understand (cognition). Work and work Statistician. Reproductive health. Screening  You may have the following tests or measurements: Height, weight, and BMI. Blood pressure. Lipid and cholesterol levels. These may be checked every 5 years, or more frequently if you are over 6 years old. Skin check. Lung cancer screening. You may have this screening every year starting at age 59 if you have a 30-pack-year history of smoking and currently smoke or have quit within the past 15 years. Fecal occult blood test (FOBT) of the stool. You may have this test every year starting at age 33. Flexible sigmoidoscopy or colonoscopy. You may have a sigmoidoscopy every 5 years or a colonoscopy every 10 years starting at age 41. Hepatitis C blood test. Hepatitis B blood test. Sexually transmitted disease (STD) testing. Diabetes screening. This is done by checking your blood sugar (glucose) after you have not eaten for a while (fasting). You may have this done every 1-3 years. Bone density scan. This is done to screen for osteoporosis. You may have this done starting at age 10. Mammogram. This may be done every 1-2 years. Talk to your health care provider about how often you should have regular mammograms. Talk with your health care provider about your test results, treatment options, and if necessary, the need for more tests. Vaccines  Your health care provider may recommend certain vaccines, such as: Influenza vaccine. This is recommended every year. Tetanus, diphtheria, and acellular pertussis (Tdap, Td) vaccine. You may need a Td booster every 10 years. Zoster vaccine. You may need this after age 20. Pneumococcal 13-valent conjugate (PCV13) vaccine. One dose is recommended after age 86. Pneumococcal polysaccharide  (PPSV23) vaccine. One  dose is recommended after age 27. Talk to your health care provider about which screenings and vaccines you need and how often you need them. This information is not intended to replace advice given to you by your health care provider. Make sure you discuss any questions you have with your health care provider. Document Released: 07/22/2015 Document Revised: 03/14/2016 Document Reviewed: 04/26/2015 Elsevier Interactive Patient Education  2017 [place] Prevention in the Home Falls can cause injuries. They can happen to people of all ages. There are many things you can do to make your home safe and to help prevent falls. What can I do on the outside of my home? Regularly fix the edges of walkways and driveways and fix any cracks. Remove anything that might make you trip as you walk through a door, such as a raised step or threshold. Trim any bushes or trees on the path to your home. Use bright outdoor lighting. Clear any walking paths of anything that might make someone trip, such as rocks or tools. Regularly check to see if handrails are loose or broken. Make sure that both sides of any steps have handrails. Any raised decks and porches should have guardrails on the edges. Have any leaves, snow, or ice cleared regularly. Use sand or salt on walking paths during winter. Clean up any spills in your garage right away. This includes oil or grease spills. What can I do in the bathroom? Use night lights. Install grab bars by the toilet and in the tub and shower. Do not use towel bars as grab bars. Use non-skid mats or decals in the tub or shower. If you need to sit down in the shower, use a plastic, non-slip stool. Keep the floor dry. Clean up any water that spills on the floor as soon as it happens. Remove soap buildup in the tub or shower regularly. Attach bath mats securely with double-sided non-slip rug tape. Do not have throw rugs and other things on the  floor that can make you trip. What can I do in the bedroom? Use night lights. Make sure that you have a light by your bed that is easy to reach. Do not use any sheets or blankets that are too big for your bed. They should not hang down onto the floor. Have a firm chair that has side arms. You can use this for support while you get dressed. Do not have throw rugs and other things on the floor that can make you trip. What can I do in the kitchen? Clean up any spills right away. Avoid walking on wet floors. Keep items that you use a lot in easy-to-reach places. If you need to reach something above you, use a strong step stool that has a grab bar. Keep electrical cords out of the way. Do not use floor polish or wax that makes floors slippery. If you must use wax, use non-skid floor wax. Do not have throw rugs and other things on the floor that can make you trip. What can I do with my stairs? Do not leave any items on the stairs. Make sure that there are handrails on both sides of the stairs and use them. Fix handrails that are broken or loose. Make sure that handrails are as long as the stairways. Check any carpeting to make sure that it is firmly attached to the stairs. Fix any carpet that is loose or worn. Avoid having throw rugs at the top or bottom of the stairs.  If you do have throw rugs, attach them to the floor with carpet tape. Make sure that you have a light switch at the top of the stairs and the bottom of the stairs. If you do not have them, ask someone to add them for you. What else can I do to help prevent falls? Wear shoes that: Do not have high heels. Have rubber bottoms. Are comfortable and fit you well. Are closed at the toe. Do not wear sandals. If you use a stepladder: Make sure that it is fully opened. Do not climb a closed stepladder. Make sure that both sides of the stepladder are locked into place. Ask someone to hold it for you, if possible. Clearly mark and make  sure that you can see: Any grab bars or handrails. First and last steps. Where the edge of each step is. Use tools that help you move around (mobility aids) if they are needed. These include: Canes. Walkers. Scooters. Crutches. Turn on the lights when you go into a dark area. Replace any light bulbs as soon as they burn out. Set up your furniture so you have a clear path. Avoid moving your furniture around. If any of your floors are uneven, fix them. If there are any pets around you, be aware of where they are. Review your medicines with your doctor. Some medicines can make you feel dizzy. This can increase your chance of falling. Ask your doctor what other things that you can do to help prevent falls. This information is not intended to replace advice given to you by your health care provider. Make sure you discuss any questions you have with your health care provider. Document Released: 04/21/2009 Document Revised: 12/01/2015 Document Reviewed: 07/30/2014 Elsevier Interactive Patient Education  2017 [employer].

## 2022-05-02 NOTE — Patient Instructions (Signed)

## 2022-05-02 NOTE — Progress Notes (Signed)
Subjective:   [name] is a 81 y.o. female who presents for Medicare Annual (Subsequent) preventive examination.  Review of Systems     Cardiac Risk Factors include: advanced age (>66men, >5 women);diabetes mellitus;dyslipidemia;hypertension;obesity (BMI >30kg/m2)     Objective:    Today's Vitals   05/02/22 0849  BP: 122/60  Pulse: 90  Temp: (!) 97.5 F (36.4 C)  TempSrc: Oral  SpO2: 99%  Weight: 211 lb 12.8 oz (96.1 kg)  Height: $Remove'5\' 3"'VuzXTut$  (1.6 m)   Body mass index is 37.52 kg/m.     05/02/2022    9:01 AM 03/30/2022   11:09 AM 07/25/2021    8:58 AM 07/18/2021   10:49 AM 03/29/2021    2:57 PM 01/02/2021   10:24 AM 10/07/2020    3:24 PM  Advanced Directives  Does Patient Have a Medical Advance Directive? Yes Yes Yes Yes Yes No Yes  Type of Paramedic of [place];Living will [place];Living will Healthcare Power of [place] of [place];Living will  Living will;Healthcare Power of Attorney  Does patient want to make changes to medical advance directive?   No - Patient declined No - Patient declined     Copy of [place] in Chart? Yes - validated most recent copy scanned in chart (See row information) No - copy requested No - copy requested  Yes - validated most recent copy scanned in chart (See row information)      Current Medications (verified) Outpatient Encounter Medications as of 05/02/2022  Medication Sig   aspirin EC 81 MG tablet Take 81 mg by mouth daily. Swallow whole.   bimatoprost (LUMIGAN) 0.01 % SOLN Place 1 drop into both eyes at bedtime.    Blood Glucose Monitoring Suppl (ONETOUCH VERIO FLEX SYSTEM) w/Device KIT USE TO CHECK BLOOD SUGAR 3  TIMES DAILY   Carboxymethylcellulose Sodium (REFRESH LIQUIGEL) 1 % GEL Place 1 drop into both eyes at bedtime.   CRESTOR 20 MG tablet TAKE 1 TABLET BY MOUTH AT  BEDTIME   famotidine (PEPCID) 20 MG tablet Take 20 mg by  mouth daily with breakfast.   furosemide (LASIX) 20 MG tablet TAKE 1 TABLET BY MOUTH IN  THE MORNING   glucose blood (ONETOUCH VERIO) test strip USE AS INSTRUCTED TO CHECK  BLOOD SUGAR 3 TIMES DAILY   Lancets (ONETOUCH DELICA PLUS GURKYH06C) MISC USE ONE LANCET 3 TIMES  DAILY BEFORE MEALS   levothyroxine (SYNTHROID) 50 MCG tablet Take 1 tablet (50 mcg total) by mouth daily.   Lifitegrast (XIIDRA) 5 % SOLN Place 1 drop into both eyes daily. xiidra   magnesium oxide (MAG-OX) 400 (240 Mg) MG tablet TAKE 1 TABLET BY MOUTH EVERY DAY AFTER SUPPER   Multiple Vitamins-Minerals (MULTIVITAMIN WITH MINERALS) tablet Take 1 tablet by mouth daily. Centrum Silver   olmesartan (BENICAR) 40 MG tablet Take 1 tablet (40 mg total) by mouth daily.   OVER THE COUNTER MEDICATION Apply 1 Application topically daily. Glucosamine  Use on Hand   polyethylene glycol (MIRALAX / GLYCOLAX) packet Take 17 g by mouth daily.   vitamin B-6 (PYRIDOXINE) 25 MG tablet Take 25 mg by mouth daily.   albuterol (VENTOLIN HFA) 108 (90 Base) MCG/ACT inhaler Inhale 1 puff into the lungs as needed.   HYDROcodone-acetaminophen (NORCO/VICODIN) 5-325 MG tablet Take 1 tablet by mouth every 6 (six) hours as needed for moderate pain. (Patient not taking: Reported on 05/02/2022)   No facility-administered encounter  medications on file as of 05/02/2022.    Allergies (verified) Codeine, Tramadol, and Macrobid [nitrofurantoin macrocrystal]   History: Past Medical History:  Diagnosis Date   Arthritis    RIGHT KNEE PAIN AND OA   Chest pain 05/03/2015   Diabetes mellitus without complication (HCC)    Elevated cholesterol    GERD (gastroesophageal reflux disease)    History of kidney stones    Hypertension    Hypothyroidism    IBS (irritable bowel syndrome)    Localized swelling of both lower legs    Rotator cuff disorder    BOTH SHOULDERS - NO SURGERY - AND STATES LIMITATIONS IN ARM MOVEMENT   Shortness of breath 05/03/2015   Swelling     CHRONIC SWELLING RIGHT HAND AND BOTH FEET AND BOTH LEGS   Vertigo    Past Surgical History:  Procedure Laterality Date   ABDOMINAL HYSTERECTOMY  1985   BREAST SURGERY  1985   RIGHT BREAST BIOPSY - BENIGN   CARPAL TUNNEL RELEASE Left 02/05/2019   Procedure: LEFT CARPAL TUNNEL RELEASE;  Surgeon: Daryll Brod, MD;  Location: [place];  Service: Orthopedics;  Laterality: Left;   CARPAL TUNNEL RELEASE Right 07/25/2021   Procedure: RIGHT CARPAL TUNNEL RELEASE;  Surgeon: Daryll Brod, MD;  Location: [place];  Service: Orthopedics;  Laterality: Right;  Bier block   [place]; SURGERY FOR MACULAR HOLE    HEMORRHOID SURGERY  1975   JOINT REPLACEMENT     PARATHYROIDECTOMY Right 04/12/2022   Procedure: RIGHT INFERIOR PARATHYROIDECTOMY;  Surgeon: Armandina Gemma, MD;  Location: [facility];  Service: General;  Laterality: Right;   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   Procedure: RIGHT TOTAL KNEE ARTHROPLASTY;  Surgeon: Gearlean Alf, MD;  Location: [facility];  Service: Orthopedics;  Laterality: Right;   Family History  Problem Relation Age of Onset   Diabetes Mother    Dementia Mother    Diabetes Father    Diabetes Sister    Breast cancer Sister    Kidney cancer Sister    Lung cancer Sister    Lung cancer Maternal Aunt    Pancreatic cancer Maternal Aunt    Cancer Other    Social History   Socioeconomic History   Marital status: Widowed    Spouse name: nathaniel   Number of children: 4   Years of education: 12   Highest education level: Not on file  Occupational History   Occupation: Retired  Tobacco Use   Smoking status: Never   Smokeless tobacco: Never  Vaping Use   Vaping Use: Never used  Substance and Sexual Activity   Alcohol use: Yes    Comment: Rare   Drug use: No   Sexual activity: Not Currently    Birth control/protection: Surgical  Other Topics Concern   Not on file   Social History Narrative   [name]   Patient is retired.    Patient has 4 children.    Patient has a high school education.    Patient is widowed   Right handed      Epworth Sleepiness Scale = 1 (as of 05/03/2015)   Social Determinants of Health   Financial Resource Strain: Low Risk  (05/02/2022)   Overall Financial Resource Strain (CARDIA)    Difficulty of Paying Living Expenses: Not hard at all  Food Insecurity: No Food Insecurity (05/02/2022)   Hunger Vital Sign  Worried About Charity fundraiser in the Last Year: Never true    [place] in the Last Year: Never true  Transportation Needs: No Transportation Needs (05/02/2022)   PRAPARE - Hydrologist (Medical): No    Lack of Transportation (Non-Medical): No  Physical Activity: Sufficiently Active (05/02/2022)   Exercise Vital Sign    Days of Exercise per Week: 4 days    Minutes of Exercise per Session: 60 min  Stress: No Stress Concern Present (05/02/2022)   [place]    Feeling of Stress : Not at all  Social Connections: Not on file    Tobacco Counseling Counseling given: Not Answered   Clinical Intake:  Pre-visit preparation completed: Yes  Pain : No/denies pain     Nutritional Status: BMI > 30  Obese Nutritional Risks: None Diabetes: Yes  How often do you need to have someone help you when you read instructions, pamphlets, or other written materials from your doctor or pharmacy?: 1 - Never What is the last grade level you completed in school?: 12th grade  Diabetic? Yes Nutrition Risk Assessment:  Has the patient had any N/V/D within the last 2 months?  No  Does the patient have any non-healing wounds?  No  Has the patient had any unintentional weight loss or weight gain?  No   Diabetes:  Is the patient diabetic?  Yes  If diabetic, was a CBG obtained today?  No  Did the  patient bring in their glucometer from home?  No  How often do you monitor your CBG's? daily.   Financial Strains and Diabetes Management:  Are you having any financial strains with the device, your supplies or your medication? No .  Does the patient want to be seen by Chronic Care Management for management of their diabetes?  No  Would the patient like to be referred to a Nutritionist or for Diabetic Management?  No   Diabetic Exams:  Diabetic Eye Exam: Completed 01/11/2022 Diabetic Foot Exam: Overdue, Pt has been advised about the importance in completing this exam. Pt is scheduled for diabetic foot exam on next appointment.   Interpreter Needed?: No  Information entered by :: NAllen LPN   Activities of Daily Living    05/02/2022    9:04 AM 03/30/2022   11:08 AM  In your present state of health, do you have any difficulty performing the following activities:  Hearing? 0 0  Vision? 0 0  Difficulty concentrating or making decisions? 0 0  Walking or climbing stairs? 1 1  Comment  Due to shortness of breath  Dressing or bathing? 0 0  Doing errands, shopping? 0   Preparing Food and eating ? N   Using the Toilet? N   In the past six months, have you accidently leaked urine? Y   Do you have problems with loss of bowel control? N   Managing your Medications? N   Managing your Finances? N   Housekeeping or managing your Housekeeping? N     Patient Care Team: Glendale Chard, MD as PCP - General (Internal Medicine) Mayford Knife, [facility] (Pharmacist) Marylynn Pearson, MD as Consulting Physician (Ophthalmology)  Indicate any recent Medical Services you may have received from other than [facility] providers in the past year (date may be approximate).     Assessment:   This is a routine wellness examination for [place].  Hearing/Vision screen Vision Screening - Comments::  Regular eye exams, Dr. Venetia Maxon  Dietary issues and exercise activities discussed: Current Exercise Habits:  Structured exercise class, Type of exercise: Other - see comments;walking (water aerobics), Time (Minutes): 60, Frequency (Times/Week): 4, Weekly Exercise (Minutes/Week): 240   Goals Addressed             This Visit's Progress    Patient Stated       05/02/2022, wants to lose weight and watch diet       Depression Screen    05/02/2022    9:04 AM 03/29/2021    2:58 PM 04/28/2020    2:07 PM 12/23/2019   11:22 AM 07/07/2019   10:06 PM 01/27/2019    2:21 PM 11/04/2018   10:07 AM  PHQ 2/9 Scores  PHQ - 2 Score 0 0 0 0 2 0 0  PHQ- 9 Score    3 6 0     Fall Risk    05/02/2022    9:04 AM 03/29/2021    2:58 PM 02/23/2021   10:10 AM 04/28/2020    2:07 PM 12/23/2019   11:22 AM  Fall Risk   Falls in the past year? 0 1 1 0 0  Comment  not sure what happened     Number falls in past yr: 0 0 0 0   Injury with Fall? 0 0 0    Risk for fall due to : Medication side effect Medication side effect   Medication side effect  Follow up Falls prevention discussed;Education provided;Falls evaluation completed Falls evaluation completed;Education provided;Falls prevention discussed   Falls evaluation completed;Education provided;Falls prevention discussed    FALL RISK PREVENTION PERTAINING TO THE HOME:  Any stairs in or around the home? No  If so, are there any without handrails? N/a Home free of loose throw rugs in walkways, pet beds, electrical cords, etc? Yes  Adequate lighting in your home to reduce risk of falls? Yes   ASSISTIVE DEVICES UTILIZED TO PREVENT FALLS:  Life alert? No  Use of a cane, walker or w/c? No  Grab bars in the bathroom? Yes  Shower chair or bench in shower? No  Elevated toilet seat or a handicapped toilet? Yes   TIMED UP AND GO:  Was the test performed? Yes .  Length of time to ambulate 10 feet: 5 sec.   Gait slow and steady without use of assistive device  Cognitive Function:        05/02/2022    9:05 AM 03/29/2021    2:59 PM 12/23/2019   11:25 AM  01/27/2019    2:24 PM 05/01/2018   10:29 AM  6CIT Screen  What Year? 0 points 0 points 0 points 0 points 0 points  What month? 0 points 0 points 0 points 0 points 0 points  What time? 0 points 0 points 0 points 0 points 0 points  Count back from 20 2 points 0 points 2 points 0 points 0 points  Months in reverse 0 points 0 points 0 points 0 points 0 points  Repeat phrase 2 points 8 points 0 points 0 points 0 points  Total Score 4 points 8 points 2 points 0 points 0 points    Immunizations Immunization History  Administered Date(s) Administered   Fluad Quad(high Dose 65+) 04/29/2019   Influenza,inj,quad, With Preservative 04/08/2017   Influenza-Unspecified 04/08/2013, 04/28/2018, 04/27/2020, 05/03/2021   Moderna Covid-19 Vaccine Bivalent Booster 63yrs & up 06/06/2021   Moderna Sars-Covid-2 Vaccination 07/20/2019, 08/17/2019, 05/14/2020   PFIZER Comirnaty(Gray Top)Covid-19 Tri-Sucrose Vaccine  10/20/2020   Pneumococcal Conjugate-13 04/28/2018   Pneumococcal Polysaccharide-23 08/15/2016   Tdap 08/15/2016   Zoster Recombinat (Shingrix) 10/30/2017, 12/25/2017    TDAP status: Up to date  Flu Vaccine status: Up to date  Pneumococcal vaccine status: Up to date  Covid-19 vaccine status: Completed vaccines  Qualifies for Shingles Vaccine? Yes   Zostavax completed Yes   Shingrix Completed?: Yes  Screening Tests Health Maintenance  Topic Date Due   FOOT EXAM  Never done   COVID-19 Vaccine (6 - Moderna risk series) 08/01/2021   INFLUENZA VACCINE  02/06/2022   Medicare Annual Wellness (AWV)  04/28/2022   HEMOGLOBIN A1C  09/28/2022   Diabetic kidney evaluation - Urine ACR  10/11/2022   MAMMOGRAM  12/26/2022   OPHTHALMOLOGY EXAM  01/12/2023   Diabetic kidney evaluation - GFR measurement  03/31/2023   TETANUS/TDAP  08/15/2026   Pneumonia Vaccine 39+ Years old  Completed   DEXA SCAN  Completed   Zoster Vaccines- Shingrix  Completed   HPV VACCINES  Aged Out    Health  Maintenance  Health Maintenance Due  Topic Date Due   FOOT EXAM  Never done   COVID-19 Vaccine (6 - Moderna risk series) 08/01/2021   INFLUENZA VACCINE  02/06/2022   Medicare Annual Wellness (AWV)  04/28/2022    Colorectal cancer screening: No longer required.   Mammogram status: Completed 12/25/2021. Repeat every year  Bone Density status: Completed 12/20/2000.   Lung Cancer Screening: (Low Dose CT Chest recommended if Age 66-80 years, 30 pack-year currently smoking OR have quit w/in 15years.) does not qualify.   Lung Cancer Screening Referral: no  Additional Screening:  Hepatitis C Screening: does not qualify;   Vision Screening: Recommended annual ophthalmology exams for early detection of glaucoma and other disorders of the eye. Is the patient up to date with their annual eye exam?  Yes  Who is the provider or what is the name of the office in which the patient attends annual eye exams? Dr. Venetia Maxon If pt is not established with a provider, would they like to be referred to a provider to establish care? No .   Dental Screening: Recommended annual dental exams for proper oral hygiene  Community Resource Referral / Chronic Care Management: CRR required this visit?  No   CCM required this visit?  No      Plan:     I have personally reviewed and noted the following in the patient's chart:   Medical and social history Use of alcohol, tobacco or illicit drugs  Current medications and supplements including opioid prescriptions. Patient is not currently taking opioid prescriptions. Functional ability and status Nutritional status Physical activity Advanced directives List of other physicians Hospitalizations, surgeries, and ER visits in previous 12 months Vitals Screenings to include cognitive, depression, and falls Referrals and appointments  In addition, I have reviewed and discussed with patient certain preventive protocols, quality metrics, and best practice  recommendations. A written personalized care plan for preventive services as well as general preventive health recommendations were provided to patient.     Kellie Simmering, LPN   16/07/930   Nurse Notes: none

## 2022-05-04 ENCOUNTER — Telehealth: Payer: Self-pay | Admitting: *Deleted

## 2022-05-04 NOTE — Telephone Encounter (Signed)
Contacted regarding PREP class referral. Interested in participating at [facility]. Will contact when scheduling in December 2023.

## 2022-05-06 DIAGNOSIS — L309 Dermatitis, unspecified: Secondary | ICD-10-CM | POA: Insufficient documentation

## 2022-05-06 DIAGNOSIS — D751 Secondary polycythemia: Secondary | ICD-10-CM | POA: Insufficient documentation

## 2022-05-06 DIAGNOSIS — M7989 Other specified soft tissue disorders: Secondary | ICD-10-CM | POA: Insufficient documentation

## 2022-05-16 ENCOUNTER — Other Ambulatory Visit: Payer: Medicare Other

## 2022-05-17 ENCOUNTER — Ambulatory Visit
Admission: RE | Admit: 2022-05-17 | Discharge: 2022-05-17 | Disposition: A | Discharge status: Home / Self Care | Payer: Medicare Other | Source: Ambulatory Visit | Attending: Internal Medicine | Admitting: Internal Medicine

## 2022-05-17 DIAGNOSIS — R918 Other nonspecific abnormal finding of lung field: Secondary | ICD-10-CM

## 2022-06-05 ENCOUNTER — Telehealth: Payer: Medicare Other

## 2022-06-06 ENCOUNTER — Telehealth: Payer: Self-pay

## 2022-06-06 NOTE — Chronic Care Management (AMB) (Signed)
    Called [facility], No answer, left message of appointment on 06-08-2022 at 2:00 via telephone visit with [name], Pharm D. Notified to have all medications, supplements, blood pressure and/or blood sugar logs available during appointment and to return call if need to reschedule.   Care Gaps: Yearly foot exam overdue Covid booster overdue  Star Rating Drug: Olmesartan 40 mg- Last filled 04-17-2022 90 DS Rosuvastatin 20 mg- Last filled 03-26-2022 90 DS  Any gaps in medications fill history? No   [place] Pharmacist Assistant [phone]

## 2022-06-08 ENCOUNTER — Telehealth: Payer: Medicare Other

## 2022-06-08 ENCOUNTER — Telehealth: Payer: Self-pay

## 2022-06-08 NOTE — Telephone Encounter (Signed)
  Care Management   Follow Up Note   06/08/2022 Name: [name] MRN: [id number] DOB: March 20, 1941   Referred by: Glendale Chard, MD Reason for referral : No chief complaint on file.   An unsuccessful telephone outreach was attempted today. The patient was referred to the case management team for assistance with care management and care coordination.  I was unable to reach the patient and unable to leave a message due to her VM being full.   Follow Up Plan: The patient has been provided with contact information for the care management team and has been advised to call with any health related questions or concerns.   [name], CPP, PharmD Clinical Pharmacist Practitioner [facility] [phone]

## 2022-06-13 ENCOUNTER — Telehealth: Payer: Self-pay

## 2022-06-13 NOTE — Telephone Encounter (Signed)
Called to discuss December PREP class schedule, left voicemail

## 2022-06-18 ENCOUNTER — Telehealth: Payer: Self-pay

## 2022-06-18 NOTE — Telephone Encounter (Signed)
She returned my call, explained PREP, wants to attend 07/10/22 class at [facility], every T/Th 12-1;15; will contact toward end of December to set up assessment visit.

## 2022-06-26 ENCOUNTER — Encounter: Payer: Self-pay | Admitting: Internal Medicine

## 2022-06-26 ENCOUNTER — Ambulatory Visit (INDEPENDENT_AMBULATORY_CARE_PROVIDER_SITE_OTHER): Payer: Medicare Other | Admitting: Internal Medicine

## 2022-06-26 VITALS — BP 130/78 | HR 74 | Temp 97.7°F | Ht 63.0 in | Wt 214.8 lb

## 2022-06-26 DIAGNOSIS — E785 Hyperlipidemia, unspecified: Secondary | ICD-10-CM

## 2022-06-26 DIAGNOSIS — H9201 Otalgia, right ear: Secondary | ICD-10-CM

## 2022-06-26 DIAGNOSIS — E1169 Type 2 diabetes mellitus with other specified complication: Secondary | ICD-10-CM | POA: Diagnosis not present

## 2022-06-26 DIAGNOSIS — I739 Peripheral vascular disease, unspecified: Secondary | ICD-10-CM

## 2022-06-26 DIAGNOSIS — L819 Disorder of pigmentation, unspecified: Secondary | ICD-10-CM

## 2022-06-26 DIAGNOSIS — M79671 Pain in right foot: Secondary | ICD-10-CM | POA: Diagnosis not present

## 2022-06-26 DIAGNOSIS — I10 Essential (primary) hypertension: Secondary | ICD-10-CM | POA: Diagnosis not present

## 2022-06-26 DIAGNOSIS — L659 Nonscarring hair loss, unspecified: Secondary | ICD-10-CM

## 2022-06-26 DIAGNOSIS — Z6838 Body mass index (BMI) 38.0-38.9, adult: Secondary | ICD-10-CM

## 2022-06-26 MED ORDER — TIZANIDINE HCL 4 MG PO TABS: 4.0000 mg | ORAL_TABLET | Freq: Every day | ORAL | 1 refills | Status: DC

## 2022-06-26 NOTE — Progress Notes (Signed)
[name],acting as a Education administrator for [name], MD.,have documented all relevant documentation on the behalf of [name], MD,as directed by  [name], MD while in the presence of [name], MD.    Subjective:     Patient ID: [name] , female    DOB: 09-18-1940 , 81 y.o.   MRN: [id number]   Chief Complaint  Patient presents with   Hypertension    HPI  Patient presents today for a 8 week f/u. She reports you increased her Benicar  from 20 to 25m, she reports she is concerned about her hair falling out and is not sure if it is the bp medicine.   Patient also reports having some pain on the side of her face and her right ear hurts as well.  Hypertension This is a chronic problem. The current episode started more than 1 year ago. The problem has been gradually improving since onset. The problem is controlled. Pertinent negatives include no blurred vision. Agents associated with hypertension include thyroid hormones. Risk factors for coronary artery disease include dyslipidemia, obesity, post-menopausal state and sedentary lifestyle. Past treatments include angiotensin blockers. The current treatment provides moderate improvement. Compliance problems include exercise.   Diabetes She presents for her follow-up diabetic visit. She has type 2 diabetes mellitus. Her disease course has been stable. There are no hypoglycemic associated symptoms. There are no diabetic associated symptoms. Pertinent negatives for diabetes include no blurred vision. There are no hypoglycemic complications. Risk factors for coronary artery disease include diabetes mellitus, dyslipidemia, hypertension, obesity, post-menopausal and sedentary lifestyle. She participates in exercise intermittently. Eye exam is current.     Past Medical History:  Diagnosis Date   Arthritis    RIGHT KNEE PAIN AND OA   Chest pain 05/03/2015   Diabetes mellitus without complication (HCC)    Elevated  cholesterol    GERD (gastroesophageal reflux disease)    History of kidney stones    Hypertension    Hypothyroidism    IBS (irritable bowel syndrome)    Localized swelling of both lower legs    Rotator cuff disorder    BOTH SHOULDERS - NO SURGERY - AND STATES LIMITATIONS IN ARM MOVEMENT   Shortness of breath 05/03/2015   Swelling    CHRONIC SWELLING RIGHT HAND AND BOTH FEET AND BOTH LEGS   Vertigo      Family History  Problem Relation Age of Onset   Diabetes Mother    Dementia Mother    Diabetes Father    Diabetes Sister    Breast cancer Sister    Kidney cancer Sister    Lung cancer Sister    Lung cancer Maternal Aunt    Pancreatic cancer Maternal Aunt    Cancer Other      Current Outpatient Medications:    albuterol (VENTOLIN HFA) 108 (90 Base) MCG/ACT inhaler, Inhale 1 puff into the lungs as needed., Disp: , Rfl:    aspirin EC 81 MG tablet, Take 81 mg by mouth daily. Swallow whole., Disp: , Rfl:    bimatoprost (LUMIGAN) 0.01 % SOLN, Place 1 drop into both eyes at bedtime. , Disp: , Rfl:    Blood Glucose Monitoring Suppl (ONETOUCH VERIO FLEX SYSTEM) w/Device KIT, USE TO CHECK BLOOD SUGAR 3  TIMES DAILY, Disp: 1 kit, Rfl: 1   Carboxymethylcellulose Sodium (REFRESH LIQUIGEL) 1 % GEL, Place 1 drop into both eyes at bedtime., Disp: , Rfl:    CRESTOR 20 MG tablet, TAKE  1 TABLET BY MOUTH AT  BEDTIME, Disp: 90 tablet, Rfl: 2   famotidine (PEPCID) 20 MG tablet, Take 20 mg by mouth daily with breakfast., Disp: , Rfl:    furosemide (LASIX) 20 MG tablet, TAKE 1 TABLET BY MOUTH IN  THE MORNING, Disp: 90 tablet, Rfl: 3   glucose blood (ONETOUCH VERIO) test strip, USE AS INSTRUCTED TO CHECK  BLOOD SUGAR 3 TIMES DAILY, Disp: 300 strip, Rfl: 3   HYDROcodone-acetaminophen (NORCO/VICODIN) 5-325 MG tablet, Take 1 tablet by mouth every 6 (six) hours as needed for moderate pain., Disp: 15 tablet, Rfl: 0   Lancets (ONETOUCH DELICA PLUS ZDGLOV56E) MISC, USE ONE LANCET 3 TIMES  DAILY BEFORE MEALS,  Disp: 300 each, Rfl: 3   levothyroxine (SYNTHROID) 50 MCG tablet, Take 1 tablet (50 mcg total) by mouth daily., Disp: 30 tablet, Rfl: 11   Lifitegrast (XIIDRA) 5 % SOLN, Place 1 drop into both eyes daily. xiidra, Disp: , Rfl:    magnesium oxide (MAG-OX) 400 (240 Mg) MG tablet, TAKE 1 TABLET BY MOUTH EVERY DAY AFTER SUPPER, Disp: 90 tablet, Rfl: 1   Multiple Vitamins-Minerals (MULTIVITAMIN WITH MINERALS) tablet, Take 1 tablet by mouth daily. Centrum Silver, Disp: , Rfl:    olmesartan (BENICAR) 40 MG tablet, Take 1 tablet (40 mg total) by mouth daily., Disp: 90 tablet, Rfl: 1   OVER THE COUNTER MEDICATION, Apply 1 Application topically daily. Glucosamine  Use on Hand, Disp: , Rfl:    polyethylene glycol (MIRALAX / GLYCOLAX) packet, Take 17 g by mouth daily., Disp: , Rfl:    tiZANidine (ZANAFLEX) 4 MG tablet, Take 1 tablet (4 mg total) by mouth daily., Disp: 30 tablet, Rfl: 1   triamcinolone cream (KENALOG) 0.1 %, APPLY TO AFFECTED AREA TWICE DAILY AS NEEDED, Disp: 45 g, Rfl: 0   vitamin B-6 (PYRIDOXINE) 25 MG tablet, Take 25 mg by mouth daily., Disp: , Rfl:    Allergies  Allergen Reactions   Codeine Nausea And Vomiting   Tramadol Nausea And Vomiting   Macrobid [Nitrofurantoin Macrocrystal] Rash     Review of Systems  Constitutional: Negative.   HENT:  Positive for ear pain.   Eyes: Negative.  Negative for blurred vision.  Respiratory: Negative.    Cardiovascular: Negative.   Gastrointestinal: Negative.   Musculoskeletal:  Positive for arthralgias.  Skin:  Positive for color change.  Neurological: Negative.   Psychiatric/Behavioral: Negative.       Today's Vitals   06/26/22 1130  BP: 130/78  Pulse: 74  Temp: 97.7 F (36.5 C)  Weight: 214 lb 12.8 oz (97.4 kg)  Height: _0  (1.6 m)  PainSc: 0-No pain   Body mass index is 38.05 kg/m.  Wt Readings from Last 3 Encounters:  06/26/22 214 lb 12.8 oz (97.4 kg)  05/02/22 211 lb 13.8 oz (96.1 kg)  05/02/22 211 lb 12.8 oz (96.1 kg)     BP Readings from Last 3 Encounters:  06/26/22 130/78  05/02/22 122/60  05/02/22 122/60     Objective:  Physical Exam Vitals and nursing note reviewed.  Constitutional:      Appearance: Normal appearance. She is obese.  HENT:     Head: Normocephalic and atraumatic.     Jaw: There is normal jaw occlusion. Tenderness present.     Right Ear: Tympanic membrane, ear canal and external ear normal. There is no impacted cerumen.     Left Ear: Tympanic membrane, ear canal and external ear normal. There is no impacted cerumen.  Nose:     Comments: Masked     Mouth/Throat:     Comments: Masked  Eyes:     Extraocular Movements: Extraocular movements intact.  Cardiovascular:     Rate and Rhythm: Normal rate and regular rhythm.     Heart sounds: Normal heart sounds.  Pulmonary:     Effort: Pulmonary effort is normal.     Breath sounds: Normal breath sounds.  Musculoskeletal:     Cervical back: Normal range of motion.  Skin:    General: Skin is warm.  Neurological:     General: No focal deficit present.     Mental Status: She is alert.  Psychiatric:        Mood and Affect: Mood normal.        Behavior: Behavior normal.         Assessment And Plan:     1. Essential hypertension Comments: Chronic, improved control. I will check renal function today. She will c/w olmesartan 12m daily.  2. Dyslipidemia associated with type 2 diabetes mellitus ([facility] Comments: She admits to elevated BS, we discussed the use of SGLT2 inh for DM control and to decrease risk of renal/cardiac complications. She will "think about it". - CMP14+EGFR - Hemoglobin A1c  3. Right ear pain Comments: No TM abnormalities noted on exam. 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 I will send muscle relaxer for possible TMJ. - Ambulatory referral to ENT  4. Pain of right heel Comments: Heel spur vs. Plantar fasciitis. She declines Podiatry evaluation. She prefers to contact her Orthopedist for  further evaluation.  5. Hair loss Comments: I will check iron level. I am not sure if olmesartan is contributing to hair loss, she had no issues with 233mdose. She is s/p parathyroidectomy, perhaps e - Iron, TIBC and Ferritin Panel - TSH - ANA, IFA (with reflex)  6. Hypopigmentation Comments: This is located on her right wrist. She may benefit from Derm evaluation if this spreads.  7. Claud[facility] ([facility], importance of regular exercise was d/w patient. She will c/w statin therapy.  8. Class 2 severe obesity due to excess calories with serious comorbidity and body mass index (BMI) of 38.0 to 38.9 in adult ([facility]: [facility], she is encouraged to aim for at least 150 minutes of exercise/wk. She has signed up for PREP, but has decided [place] is too far to drive 2x/wk.   Patient was given opportunity to ask questions. Patient verbalized understanding of the plan and was able to repeat key elements of the plan. All questions were answered to their satisfaction.   I, RoMaximino GreenlandMD, have reviewed all documentation for this visit. The documentation on 06/26/22 for the exam, diagnosis, procedures, and orders are all accurate and complete.   IF YOU HAVE BEEN REFERRED TO A SPECIALIST, IT MAY TAKE 1-2 WEEKS TO SCHEDULE/PROCESS THE REFERRAL. IF YOU HAVE NOT HEARD FROM US/SPECIALIST IN TWO WEEKS, PLEASE GIVE [place] CALL AT [phone] X [phone].   THE PATIENT IS ENCOURAGED TO PRACTICE SOCIAL DISTANCING DUE TO THE COVID-19 PANDEMIC.

## 2022-06-26 NOTE — Patient Instructions (Addendum)
Empagliflozin; Metformin Tablets What is this medication? EMPAGLIFLOZIN; METFORMIN (EM pa gli FLOE zin; met FOR min) treats type 2 diabetes. It works by decreasing your blood sugar (glucose). Changes to diet and exercise are often combined with this medication. This medicine may be used for other purposes; ask your health care provider or pharmacist if you have questions. COMMON BRAND NAME(S): Synjardy What should I tell my care team before I take this medication? They need to know if you have any of these conditions: Anemia Dehydration Diabetic ketoacidosis Diet low in salt Eating less due to illness, surgery, dieting, or any other reason Frequently drink alcohol Having surgery Heart disease High cholesterol History of pancreatitis or pancreas problems History of yeast infection of the penis or vagina Infections in the bladder, kidneys, or urinary tract Kidney disease Liver disease Low blood pressure Polycystic ovary syndrome Problems urinating Serious infection or injury Type 1 diabetes Uncircumcised female Vomiting An unusual or allergic reaction to empagliflozin, metformin, other medications, foods, dyes, or preservatives Pregnant or trying to get pregnant Breastfeeding How should I use this medication? Take this medication by mouth with water. Take it as directed on the prescription label. Take it with food. Keep taking it unless your care team tells you to stop. A special MedGuide will be given to you by the pharmacist with each prescription and refill. Be sure to read this information carefully each time. Talk to your care team about the use of this medication in children. While it may be prescribed for children as young as 10 years for selected conditions, precautions do apply. Overdosage: If you think you have taken too much of this medicine contact a poison control center or emergency room at once. NOTE: This medicine is only for you. Do not share this medicine with  others. What if I miss a dose? If you miss a dose, take it as soon as you can. If it is almost time for your next dose, take only that dose. Do not take double or extra doses. What may interact with this medication? Do not take this medication with any of the following: Certain contrast medications given before X-rays, CT scans, MRI, or other procedures Dofetilide This medication may also interact with the following: Acetazolamide Alcohol Certain antivirals for HIV or hepatitis Certain medications for blood pressure, heart disease, irregular heart beat Cimetidine Dichlorphenamide Digoxin Diuretics Estrogen and progestin hormones Glycopyrrolate Isoniazid Lamotrigine Lithium Memantine Methazolamide Metoclopramide Midodrine Niacin Phenothiazines, such as chlorpromazine, prochlorperazine, thioridazine Phenytoin Ranolazine Steroid medications, such as prednisone or cortisone Stimulant medications for attention disorders, weight loss, or staying awake Thyroid medications Topiramate Trospium Vandetanib Zonisamide This list may not describe all possible interactions. Give your health care provider a list of all the medicines, herbs, non-prescription drugs, or dietary supplements you use. Also tell them if you smoke, drink alcohol, or use illegal drugs. Some items may interact with your medicine. What should I watch for while using this medication? Visit your care team for regular checks on your progress. This medication can cause a serious condition in which there is too much acid in the blood. If you develop nausea, vomiting, stomach pain, unusual tiredness, or breathing problems, stop taking this medication and call your care team right away. If possible, use a ketone dipstick to check for ketones in your urine. A test called the HbA1C (A1C) will be monitored. This is a simple blood test. It measures your blood sugar control over the last 2 to 3 months. You will receive this  test  every 3 to 6 months. Learn how to check your blood sugar. Learn the symptoms of low and high blood sugar and how to manage them. Always carry a quick-source of sugar with you in case you have symptoms of low blood sugar. Examples include hard sugar candy or glucose tablets. Make sure others know that you can choke if you eat or drink when you develop serious symptoms of low blood sugar, such as seizures or unconsciousness. They must get medical help at once. Tell your care team if you have high blood sugar. You might need to change the dose of your medication. If you are sick or exercising more than usual, you might need to change the dose of your medication. Do not skip meals. Ask your care team if you should avoid alcohol. Many nonprescription cough and cold products contain sugar or alcohol. These can affect blood sugar. This medication may cause you to ovulate, which may increase your chances of becoming pregnant. Talk with your care team about contraception while you are taking this medication. Contact your care team if you think you might be pregnant. If you are going to need surgery, an MRI, CT scan, or other procedure, tell your care team that you are taking this medication. You may need to stop taking this medication before the procedure. Wear a medical ID bracelet or chain, and carry a card that describes your disease and details of your medication and dosage times. This medication may cause a decrease in folic acid and vitamin B12. You should make sure that you get enough vitamins while you are taking this medication. Discuss the foods you eat and the vitamins you take with your care team. What side effects may I notice from receiving this medication? Side effects that you should report to your care team as soon as possible: Allergic reactions--skin rash, itching, hives, swelling of the face, lips, tongue, or throat Dehydration--increased thirst, dry mouth, feeling faint or lightheaded,  headache, dark yellow or brown urine Diabetic ketoacidosis (DKA)--increased thirst or amount of urine, dry mouth, fatigue, fruity odor to breath, trouble breathing, stomach pain, nausea, vomiting Genital yeast infection--redness, swelling, pain, or itchiness, odor, thick or lumpy discharge High lactic acid level--muscle pain or cramps, stomach pain, trouble breathing, general discomfort and fatigue Infection or redness, swelling, tenderness, or pain in the genitals, or area from the genitals to the back of the rectum Low vitamin B12 level--pain, tingling, or numbness in the hands or feet, muscle weakness, dizziness, confusion, trouble concentrating Urinary tract infection (UTI)--burning when passing urine, passing frequent small amounts of urine, bloody or cloudy urine, pain in the lower back or sides Side effects that usually do not require medical attention (report to your care team if they continue or are bothersome): Diarrhea Gas Headache Metallic taste in mouth Nausea This list may not describe all possible side effects. Call your doctor for medical advice about side effects. You may report side effects to FDA at 1-800-FDA-1088. Where should I keep my medication? Keep out of the reach of children and pets. Store at room temperature between 15 and 30 degrees C (59 and 86 degrees F). Throw away any unused medication after the expiration date. NOTE: This sheet is a summary. It may not cover all possible information. If you have questions about this medicine, talk to your doctor, pharmacist, or health care provider.  2023 Elsevier/Gold Standard (2021-01-27 00:00:00)  Hypertension, Adult Hypertension is another name for high blood pressure. High blood pressure forces your  heart to work harder to pump blood. This can cause problems over time. There are two numbers in a blood pressure reading. There is a top number (systolic) over a bottom number (diastolic). It is best to have a blood pressure  that is below 120/80. What are the causes? The cause of this condition is not known. Some other conditions can lead to high blood pressure. What increases the risk? Some lifestyle factors can make you more likely to develop high blood pressure: Smoking. Not getting enough exercise or physical activity. Being overweight. Having too much fat, sugar, calories, or salt (sodium) in your diet. Drinking too much alcohol. Other risk factors include: Having any of these conditions: Heart disease. Diabetes. High cholesterol. Kidney disease. Obstructive sleep apnea. Having a family history of high blood pressure and high cholesterol. Age. The risk increases with age. Stress. What are the signs or symptoms? High blood pressure may not cause symptoms. Very high blood pressure (hypertensive crisis) may cause: Headache. Fast or uneven heartbeats (palpitations). Shortness of breath. Nosebleed. Vomiting or feeling like you may vomit (nauseous). Changes in how you see. Very bad chest pain. Feeling dizzy. Seizures. How is this treated? This condition is treated by making healthy lifestyle changes, such as: Eating healthy foods. Exercising more. Drinking less alcohol. Your doctor may prescribe medicine if lifestyle changes do not help enough and if: Your top number is above 130. Your bottom number is above 80. Your personal target blood pressure may vary. Follow these instructions at home: Eating and drinking  If told, follow the DASH eating plan. To follow this plan: Fill one half of your plate at each meal with fruits and vegetables. Fill one fourth of your plate at each meal with whole grains. Whole grains include whole-wheat pasta, brown rice, and whole-grain bread. Eat or drink low-fat dairy products, such as skim milk or low-fat yogurt. Fill one fourth of your plate at each meal with low-fat (lean) proteins. Low-fat proteins include fish, chicken without skin, eggs, beans, and  tofu. Avoid fatty meat, cured and processed meat, or chicken with skin. Avoid pre-made or processed food. Limit the amount of salt in your diet to less than 1,500 mg each day. Do not drink alcohol if: Your doctor tells you not to drink. You are pregnant, may be pregnant, or are planning to become pregnant. If you drink alcohol: Limit how much you have to: 0-1 drink a day for women. 0-2 drinks a day for men. Know how much alcohol is in your drink. In the [place] one drink equals one 12 oz bottle of beer (355 mL), one 5 oz glass of wine (148 mL), or one 1 oz glass of hard liquor (44 mL). Lifestyle  Work with your doctor to stay at a healthy weight or to lose weight. Ask your doctor what the best weight is for you. Get at least 30 minutes of exercise that causes your heart to beat faster (aerobic exercise) most days of the week. This may include walking, swimming, or biking. Get at least 30 minutes of exercise that strengthens your muscles (resistance exercise) at least 3 days a week. This may include lifting weights or doing Pilates. Do not smoke or use any products that contain nicotine or tobacco. If you need help quitting, ask your doctor. Check your blood pressure at home as told by your doctor. Keep all follow-up visits. Medicines Take over-the-counter and prescription medicines only as told by your doctor. Follow directions carefully. Do not skip doses of  blood pressure medicine. The medicine does not work as well if you skip doses. Skipping doses also puts you at risk for problems. Ask your doctor about side effects or reactions to medicines that you should watch for. Contact a doctor if: You think you are having a reaction to the medicine you are taking. You have headaches that keep coming back. You feel dizzy. You have swelling in your ankles. You have trouble with your vision. Get help right away if: You get a very bad headache. You start to feel mixed up (confused). You feel  weak or numb. You feel faint. You have very bad pain in your: Chest. Belly (abdomen). You vomit more than once. You have trouble breathing. These symptoms may be an emergency. Get help right away. Call 911. Do not wait to see if the symptoms will go away. Do not drive yourself to the hospital. Summary Hypertension is another name for high blood pressure. High blood pressure forces your heart to work harder to pump blood. For most people, a normal blood pressure is less than 120/80. Making healthy choices can help lower blood pressure. If your blood pressure does not get lower with healthy choices, you may need to take medicine. This information is not intended to replace advice given to you by your health care provider. Make sure you discuss any questions you have with your health care provider. Document Revised: 04/13/2021 Document Reviewed: 04/13/2021 Elsevier Patient Education  Welcome.

## 2022-06-27 ENCOUNTER — Telehealth: Payer: Self-pay

## 2022-06-27 LAB — IRON,TIBC AND FERRITIN PANEL
Ferritin: 349 ng/mL — ABNORMAL HIGH (ref 15–150)
Iron Saturation: 33 % (ref 15–55)
Iron: 96 ug/dL (ref 27–139)
Total Iron Binding Capacity: 291 ug/dL (ref 250–450)
UIBC: 195 ug/dL (ref 118–369)

## 2022-06-27 LAB — ANTINUCLEAR ANTIBODIES, IFA: ANA Titer 1: NEGATIVE

## 2022-06-27 LAB — CMP14+EGFR
ALT: 35 IU/L — ABNORMAL HIGH (ref 0–32)
AST: 26 IU/L (ref 0–40)
Albumin/Globulin Ratio: 1.8 (ref 1.2–2.2)
Albumin: 4 g/dL (ref 3.7–4.7)
Alkaline Phosphatase: 106 IU/L (ref 44–121)
BUN/Creatinine Ratio: 20 (ref 12–28)
BUN: 13 mg/dL (ref 8–27)
Bilirubin Total: 0.7 mg/dL (ref 0.0–1.2)
CO2: 23 mmol/L (ref 20–29)
Calcium: 9.3 mg/dL (ref 8.7–10.3)
Chloride: 107 mmol/L — ABNORMAL HIGH (ref 96–106)
Creatinine, Ser: 0.65 mg/dL (ref 0.57–1.00)
Globulin, Total: 2.2 g/dL (ref 1.5–4.5)
Glucose: 119 mg/dL — ABNORMAL HIGH (ref 70–99)
Potassium: 4.4 mmol/L (ref 3.5–5.2)
Sodium: 145 mmol/L — ABNORMAL HIGH (ref 134–144)
Total Protein: 6.2 g/dL (ref 6.0–8.5)
eGFR: 87 mL/min/{1.73_m2} (ref >59)

## 2022-06-27 LAB — HEMOGLOBIN A1C
Est. average glucose Bld gHb Est-mCnc: 157 mg/dL
Hgb A1c MFr Bld: 7.1 % — ABNORMAL HIGH (ref 4.8–5.6)

## 2022-06-27 LAB — TSH: TSH: 1.1 u[IU]/mL (ref 0.450–4.500)

## 2022-06-27 NOTE — Telephone Encounter (Signed)
Called to confirm participation in [place] program at [employer]; left voicemail

## 2022-06-28 ENCOUNTER — Encounter: Payer: Self-pay | Admitting: Internal Medicine

## 2022-06-28 ENCOUNTER — Telehealth: Payer: Self-pay

## 2022-06-28 NOTE — Telephone Encounter (Signed)
Returned my call, plans to attend next PREP class at [facility] on 07/10/22, every T/Th 12-1:15; assessment visit set up for 12/27 at 1pm.

## 2022-07-04 ENCOUNTER — Telehealth: Payer: Self-pay

## 2022-07-04 NOTE — Telephone Encounter (Signed)
She called me after arriving home from PREP assessment visit at [name]; the drive is very stressful for her since she lives in [facility]; would consider going to [place], will ask Pam RN [facility] to contact her with 2024 schedule.

## 2022-07-04 NOTE — Progress Notes (Signed)
[facility] PRE[facility] Evaluation  Patient Details  Name: [name] MRN: [id number] Date of Birth: 1941-01-03 Age: 81 y.o. PCP: Glendale Chard, MD  Vitals:   07/04/22 1338  BP: 120/64  Pulse: 74  SpO2: 98%  Weight: 215 lb 6.4 oz (97.7 kg)     [facility] Eval - 07/04/22 1300       [facility] "PREP" Location   [facility] "[facility]P" Location [facility]      Referral    Referring Provider Sanders    Reason for referral Diabetes;Hypertension;Inactivity;Obesitity/Overweight    Program Start Date 07/10/22      Measurement   Waist Circumference 45.5 inches    Hip Circumference 55.5 inches    Body fat 48.5 percent      Information for Trainer   Goals --   Learn exercises for home exercise routine strenth training   Current Exercise --   swimming at [facility] 4 times/wk   Orthopedic Concerns --   s/p R TKR; shoulder ROM limitations   Pertinent Medical History --   HTN, diabetes   Current Barriers --   driving distance, lives in [place]     Mobility and Daily Activities   I find it easy to walk up or down two or more flights of stairs. 1    I have no trouble taking out the trash. 4    I do housework such as vacuuming and dusting on my own without difficulty. 1    I can easily lift a gallon of milk (8lbs). 1    I can easily walk a mile. 2    I have no trouble reaching into high cupboards or reaching down to pick up something from the floor. 3    I do not have trouble doing out-door work such as Armed forces logistics/support/administrative officer, raking leaves, or gardening. 1      Mobility and Daily Activities   I feel younger than my age. 4    I feel independent. 4    I feel energetic. 2    I live an active life.  4    I feel strong. 3    I feel healthy. 2    I feel active as other people my age. 4      How fit and strong are you.   Fit and Strong Total Score 36            Past Medical History:  Diagnosis Date   Arthritis    RIGHT KNEE PAIN AND OA   Chest pain 05/03/2015   Diabetes mellitus without complication (HCC)     Elevated cholesterol    GERD (gastroesophageal reflux disease)    History of kidney stones    Hypertension    Hypothyroidism    IBS (irritable bowel syndrome)    Localized swelling of both lower legs    Rotator cuff disorder    BOTH SHOULDERS - NO SURGERY - AND STATES LIMITATIONS IN ARM MOVEMENT   Shortness of breath 05/03/2015   Swelling    CHRONIC SWELLING RIGHT HAND AND BOTH FEET AND BOTH LEGS   Vertigo    Past Surgical History:  Procedure Laterality Date   ABDOMINAL HYSTERECTOMY  1985   BREAST SURGERY  1985   RIGHT BREAST BIOPSY - BENIGN   CARPAL TUNNEL RELEASE Left 02/05/2019   Procedure: LEFT CARPAL TUNNEL RELEASE;  Surgeon: Daryll Brod, MD;  Location: [place];  Service: Orthopedics;  Laterality: Left;   CARPAL TUNNEL RELEASE Right 07/25/2021  Procedure: RIGHT CARPAL TUNNEL RELEASE;  Surgeon: Daryll Brod, MD;  Location: [place];  Service: Orthopedics;  Laterality: Right;  Bier block   [place]; SURGERY FOR MACULAR HOLE    HEMORRHOID SURGERY  1975   JOINT REPLACEMENT     PARATHYROIDECTOMY Right 04/12/2022   Procedure: RIGHT INFERIOR PARATHYROIDECTOMY;  Surgeon: Armandina Gemma, MD;  Location: [facility];  Service: General;  Laterality: Right;   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   Procedure: RIGHT TOTAL KNEE ARTHROPLASTY;  Surgeon: Gearlean Alf, MD;  Location: [facility];  Service: Orthopedics;  Laterality: Right;   Social History   Tobacco Use  Smoking Status Never  Smokeless Tobacco Never  To begin PREP at [facility] 07/10/22, every T/Th 12-1:15  Pranavi Aure B Tekoa Hamor 07/04/2022, 1:42 PM

## 2022-07-18 ENCOUNTER — Telehealth: Payer: Self-pay

## 2022-07-18 NOTE — Progress Notes (Signed)
   Care Guide Note  07/18/2022 Name: [name] MRN: [id number] DOB: Oct 03, 1940  Referred by: Glendale Chard, MD Reason for referral : Care Coordination (Outreach to reschedule pharm d appt with [facility] pharm d )   [name] is a 82 y.o. year old female who is a primary care patient of Glendale Chard, MD. [name] was referred to the pharmacist for assistance related to DM.    An unsuccessful telephone outreach was attempted today to contact the patient who was referred to the pharmacy team for assistance with medication management. Additional attempts will be made to contact the patient.   Noreene Larsson, [place], [place] [address] Direct Dial: [phone] [email or web address]

## 2022-07-19 ENCOUNTER — Ambulatory Visit (INDEPENDENT_AMBULATORY_CARE_PROVIDER_SITE_OTHER): Payer: Medicare Other | Admitting: Internal Medicine

## 2022-07-19 ENCOUNTER — Other Ambulatory Visit: Payer: Self-pay

## 2022-07-19 ENCOUNTER — Encounter: Payer: Self-pay | Admitting: Internal Medicine

## 2022-07-19 VITALS — BP 124/72 | HR 75 | Temp 97.7°F | Ht 63.6 in | Wt 215.6 lb

## 2022-07-19 DIAGNOSIS — Z6837 Body mass index (BMI) 37.0-37.9, adult: Secondary | ICD-10-CM

## 2022-07-19 DIAGNOSIS — I700 Atherosclerosis of aorta: Secondary | ICD-10-CM | POA: Diagnosis not present

## 2022-07-19 DIAGNOSIS — R918 Other nonspecific abnormal finding of lung field: Secondary | ICD-10-CM | POA: Diagnosis not present

## 2022-07-19 DIAGNOSIS — E1169 Type 2 diabetes mellitus with other specified complication: Secondary | ICD-10-CM | POA: Diagnosis not present

## 2022-07-19 DIAGNOSIS — Z79899 Other long term (current) drug therapy: Secondary | ICD-10-CM

## 2022-07-19 DIAGNOSIS — E785 Hyperlipidemia, unspecified: Secondary | ICD-10-CM

## 2022-07-19 MED ORDER — LEVOTHYROXINE SODIUM 50 MCG PO TABS: 50.0000 ug | ORAL_TABLET | Freq: Every day | ORAL | 1 refills | Status: DC

## 2022-07-19 MED ORDER — SEMAGLUTIDE(0.25 OR 0.5MG/DOS) 2 MG/3ML ~~LOC~~ SOPN: PEN_INJECTOR | SUBCUTANEOUS | 0 refills | Status: DC

## 2022-07-19 NOTE — Patient Instructions (Signed)

## 2022-07-19 NOTE — Progress Notes (Signed)
[name],acting as a Education administrator for [name], MD.,have documented all relevant documentation on the behalf of [name], MD,as directed by  [name], MD while in the presence of [name], MD.    Subjective:     Patient ID: [name] , female    DOB: [birth year] , 82 y.o.   MRN: [id number]   Chief Complaint  Patient presents with   Diabetes   Hypertension    HPI  Patient presents today for a diabetes check. Patient stated she is unable to tolerate the Synjardy due to nausea.  She was then switched to Jardiance, nausea persisted.  She is now not on anything for her diabetes.  She is interested in restarting Ozempic. She thought it made her tired in the past, but she was then diagnosed with hyperparathyroidism. She thinks that may have caused her fatigue.    Diabetes She presents for her follow-up diabetic visit. She has type 2 diabetes mellitus. Her disease course has been stable. There are no hypoglycemic associated symptoms. There are no diabetic associated symptoms. Pertinent negatives for diabetes include no blurred vision, no polydipsia, no polyphagia and no polyuria. There are no hypoglycemic complications. Risk factors for coronary artery disease include diabetes mellitus, dyslipidemia, hypertension, obesity, post-menopausal and sedentary lifestyle. She participates in exercise intermittently. Eye exam is current.  Hypertension This is a chronic problem. The current episode started more than 1 year ago. The problem has been gradually improving since onset. The problem is controlled. Pertinent negatives include no blurred vision. Agents associated with hypertension include thyroid hormones. Risk factors for coronary artery disease include dyslipidemia, obesity, post-menopausal state and sedentary lifestyle. Past treatments include angiotensin blockers. The current treatment provides moderate improvement. Compliance problems include exercise.      Past  Medical History:  Diagnosis Date   Arthritis    RIGHT KNEE PAIN AND OA   Chest pain 05/03/2015   Diabetes mellitus without complication (HCC)    Elevated cholesterol    GERD (gastroesophageal reflux disease)    History of kidney stones    Hypertension    Hypothyroidism    IBS (irritable bowel syndrome)    Localized swelling of both lower legs    Rotator cuff disorder    BOTH SHOULDERS - NO SURGERY - AND STATES LIMITATIONS IN ARM MOVEMENT   Shortness of breath 05/03/2015   Swelling    CHRONIC SWELLING RIGHT HAND AND BOTH FEET AND BOTH LEGS   Vertigo      Family History  Problem Relation Age of Onset   Diabetes Mother    Dementia Mother    Diabetes Father    Diabetes Sister    Breast cancer Sister    Kidney cancer Sister    Lung cancer Sister    Lung cancer Maternal Aunt    Pancreatic cancer Maternal Aunt    Cancer Other      Current Outpatient Medications:    albuterol (VENTOLIN HFA) 108 (90 Base) MCG/ACT inhaler, Inhale 1 puff into the lungs as needed., Disp: , Rfl:    aspirin EC 81 MG tablet, Take 81 mg by mouth daily. Swallow whole., Disp: , Rfl:    bimatoprost (LUMIGAN) 0.01 % SOLN, Place 1 drop into both eyes at bedtime. , Disp: , Rfl:    Blood Glucose Monitoring Suppl (ONETOUCH VERIO FLEX SYSTEM) w/Device KIT, USE TO CHECK BLOOD SUGAR 3  TIMES DAILY, Disp: 1 kit, Rfl: 1   Carboxymethylcellulose Sodium (REFRESH LIQUIGEL) 1 %  GEL, Place 1 drop into both eyes at bedtime., Disp: , Rfl:    CRESTOR 20 MG tablet, TAKE 1 TABLET BY MOUTH AT  BEDTIME, Disp: 90 tablet, Rfl: 2   famotidine (PEPCID) 20 MG tablet, Take 20 mg by mouth daily with breakfast., Disp: , Rfl:    furosemide (LASIX) 20 MG tablet, TAKE 1 TABLET BY MOUTH IN  THE MORNING, Disp: 90 tablet, Rfl: 3   glucose blood (ONETOUCH VERIO) test strip, USE AS INSTRUCTED TO CHECK  BLOOD SUGAR 3 TIMES DAILY, Disp: 300 strip, Rfl: 3   Lancets (ONETOUCH DELICA PLUS [id number]) MISC, USE ONE LANCET 3 TIMES  DAILY BEFORE MEALS,  Disp: 300 each, Rfl: 3   levothyroxine (SYNTHROID) 50 MCG tablet, Take 1 tablet (50 mcg total) by mouth daily., Disp: 90 tablet, Rfl: 1   Lifitegrast (XIIDRA) 5 % SOLN, Place 1 drop into both eyes daily. xiidra, Disp: , Rfl:    magnesium oxide (MAG-OX) 400 (240 Mg) MG tablet, TAKE 1 TABLET BY MOUTH EVERY DAY AFTER SUPPER, Disp: 90 tablet, Rfl: 1   Multiple Vitamins-Minerals (MULTIVITAMIN WITH MINERALS) tablet, Take 1 tablet by mouth daily. Centrum Silver, Disp: , Rfl:    olmesartan (BENICAR) 40 MG tablet, Take 1 tablet (40 mg total) by mouth daily., Disp: 90 tablet, Rfl: 1   OVER THE COUNTER MEDICATION, Apply 1 Application topically daily. Glucosamine  Use on Hand, Disp: , Rfl:    polyethylene glycol (MIRALAX / GLYCOLAX) packet, Take 17 g by mouth daily., Disp: , Rfl:    Semaglutide,0.25 or 0.'5MG'$ /DOS, 2 MG/3ML SOPN, Inject 0.'5mg'$  sq once weekly, Disp: 3 mL, Rfl: 0   triamcinolone cream (KENALOG) 0.1 %, APPLY TO AFFECTED AREA TWICE DAILY AS NEEDED, Disp: 45 g, Rfl: 0   vitamin B-6 (PYRIDOXINE) 25 MG tablet, Take 25 mg by mouth daily., Disp: , Rfl:    Empagliflozin-metFORMIN HCl ER (SYNJARDY XR) 04-999 MG TB24, Take 1 tablet by mouth daily at 12 noon. (Patient not taking: Reported on 07/19/2022), Disp: , Rfl:    Allergies  Allergen Reactions   Codeine Nausea And Vomiting   Tramadol Nausea And Vomiting   Macrobid [Nitrofurantoin Macrocrystal] Rash     Review of Systems  Constitutional: Negative.   Eyes: Negative.  Negative for blurred vision.  Respiratory: Negative.    Cardiovascular: Negative.   Gastrointestinal: Negative.   Endocrine: Negative for polydipsia, polyphagia and polyuria.  Musculoskeletal: Negative.   Skin: Negative.   Neurological: Negative.   Psychiatric/Behavioral: Negative.       Today's Vitals   07/19/22 1111  BP: 124/72  Pulse: 75  Temp: 97.7 F (36.5 C)  Weight: 215 lb 9.6 oz (97.8 kg)  Height: 5' 3.6" (1.615 m)  PainSc: 0-No pain   Body mass index is 37.47  kg/m.  Wt Readings from Last 3 Encounters:  07/19/22 215 lb 9.6 oz (97.8 kg)  07/04/22 215 lb 6.4 oz (97.7 kg)  06/26/22 214 lb 12.8 oz (97.4 kg)     Objective:  Physical Exam Vitals and nursing note reviewed.  Constitutional:      Appearance: Normal appearance.  HENT:     Head: Normocephalic and atraumatic.     Nose:     Comments: Masked     Mouth/Throat:     Comments: Masked  Eyes:     Extraocular Movements: Extraocular movements intact.  Cardiovascular:     Rate and Rhythm: Normal rate and regular rhythm.     Heart sounds: Normal heart sounds.  Pulmonary:  Effort: Pulmonary effort is normal.     Breath sounds: Normal breath sounds.  Musculoskeletal:     Cervical back: Normal range of motion.  Skin:    General: Skin is warm.  Neurological:     General: No focal deficit present.     Mental Status: She is alert.  Psychiatric:        Mood and Affect: Mood normal.        Behavior: Behavior normal.         Assessment And Plan:     1. Dyslipidemia associated with type 2 diabetes mellitus ([place]) Comments: Chronic, LDL goal <70. I will resume Ozempic, 0.'25mg'$  weekly, f/u in 6 weeks. She was given refresh on self administration. No fhx thyroid cancer.  2. Aortic atherosclerosis (HCC) Comments: Chronic, LDL goal <70. She will c/w ASA '81mg'$  and rosuvastatin '20mg'$  daily.  3. Multiple lung nodules Comments: Last CT Nov 2023 reviewed, f/u due by May 2024. - CT Chest Wo Contrast; Future  4. Class 2 severe obesity due to excess calories with serious comorbidity and body mass index (BMI) of 37.0 to 37.9 in adult [facility] Comments: She is encouraged to aim for at least 150 minutes of exercise per week, while striving for BMI<30 to decrease cardiac risk.  5. Polypharmacy - BMP8+EGFR    Patient was given opportunity to ask questions. Patient verbalized understanding of the plan and was able to repeat key elements of the plan. All questions were answered to their satisfaction.    I, [name], MD, have reviewed all documentation for this visit. The documentation on 07/19/22 for the exam, diagnosis, procedures, and orders are all accurate and complete.   IF YOU HAVE BEEN REFERRED TO A SPECIALIST, IT MAY TAKE 1-2 WEEKS TO SCHEDULE/PROCESS THE REFERRAL. IF YOU HAVE NOT HEARD FROM US/SPECIALIST IN TWO WEEKS, PLEASE GIVE [place] A CALL AT [phone].   THE PATIENT IS ENCOURAGED TO PRACTICE SOCIAL DISTANCING DUE TO THE COVID-19 PANDEMIC.

## 2022-07-20 ENCOUNTER — Telehealth: Payer: Medicare Other

## 2022-07-20 LAB — BMP8+EGFR
BUN/Creatinine Ratio: 22 (ref 12–28)
BUN: 15 mg/dL (ref 8–27)
CO2: 25 mmol/L (ref 20–29)
Calcium: 9.2 mg/dL (ref 8.7–10.3)
Chloride: 105 mmol/L (ref 96–106)
Creatinine, Ser: 0.68 mg/dL (ref 0.57–1.00)
Glucose: 111 mg/dL — ABNORMAL HIGH (ref 70–99)
Potassium: 4.7 mmol/L (ref 3.5–5.2)
Sodium: 143 mmol/L (ref 134–144)
eGFR: 86 mL/min/{1.73_m2} (ref >59)

## 2022-07-20 NOTE — Progress Notes (Signed)
   Care Guide Note  07/20/2022 Name: [name] MRN: [id number] DOB: 01-Sep-1940  Referred by: Glendale Chard, MD Reason for referral : Care Coordination (Outreach to reschedule pharm d appt with [facility] pharm d )   [name] is a 82 y.o. year old female who is a primary care patient of Glendale Chard, MD. YASHIKA MASK was referred to the pharmacist for assistance related to HTN.    Successful contact was made with the patient to discuss pharmacy services including being ready for the pharmacist to call at least 5 minutes before the scheduled appointment time, to have medication bottles and any blood sugar or blood pressure readings ready for review. The patient agreed to meet with the pharmacist via with the pharmacist via telephone visit on (date/time).  08/31/2022  Noreene Larsson, [place], [place] [address] Direct Dial: [phone] [email or web address]

## 2022-07-21 DIAGNOSIS — I700 Atherosclerosis of aorta: Secondary | ICD-10-CM | POA: Insufficient documentation

## 2022-08-10 ENCOUNTER — Telehealth: Payer: Self-pay

## 2022-08-10 NOTE — Telephone Encounter (Signed)
Attempted to reach pt reference next PREP class without success.

## 2022-08-13 ENCOUNTER — Ambulatory Visit (HOSPITAL_BASED_OUTPATIENT_CLINIC_OR_DEPARTMENT_OTHER)
Admission: RE | Admit: 2022-08-13 | Discharge: 2022-08-13 | Disposition: A | Discharge status: Home / Self Care | Payer: Medicare Other | Source: Ambulatory Visit | Attending: Internal Medicine | Admitting: Internal Medicine

## 2022-08-13 DIAGNOSIS — R918 Other nonspecific abnormal finding of lung field: Secondary | ICD-10-CM | POA: Diagnosis present

## 2022-08-22 ENCOUNTER — Other Ambulatory Visit: Payer: Self-pay | Admitting: Internal Medicine

## 2022-08-23 ENCOUNTER — Ambulatory Visit (INDEPENDENT_AMBULATORY_CARE_PROVIDER_SITE_OTHER): Payer: Medicare Other | Admitting: Internal Medicine

## 2022-08-23 ENCOUNTER — Encounter: Payer: Self-pay | Admitting: Internal Medicine

## 2022-08-23 VITALS — BP 128/70 | HR 78 | Temp 97.9°F | Ht 63.0 in | Wt 211.8 lb

## 2022-08-23 DIAGNOSIS — E785 Hyperlipidemia, unspecified: Secondary | ICD-10-CM | POA: Diagnosis not present

## 2022-08-23 DIAGNOSIS — I700 Atherosclerosis of aorta: Secondary | ICD-10-CM | POA: Diagnosis not present

## 2022-08-23 DIAGNOSIS — L819 Disorder of pigmentation, unspecified: Secondary | ICD-10-CM

## 2022-08-23 DIAGNOSIS — I10 Essential (primary) hypertension: Secondary | ICD-10-CM

## 2022-08-23 DIAGNOSIS — E1169 Type 2 diabetes mellitus with other specified complication: Secondary | ICD-10-CM | POA: Diagnosis not present

## 2022-08-23 DIAGNOSIS — Z6837 Body mass index (BMI) 37.0-37.9, adult: Secondary | ICD-10-CM

## 2022-08-23 DIAGNOSIS — F4321 Adjustment disorder with depressed mood: Secondary | ICD-10-CM

## 2022-08-23 MED ORDER — SEMAGLUTIDE(0.25 OR 0.5MG/DOS) 2 MG/3ML ~~LOC~~ SOPN: PEN_INJECTOR | SUBCUTANEOUS | 1 refills | Status: DC

## 2022-08-23 NOTE — Patient Instructions (Signed)

## 2022-08-23 NOTE — Progress Notes (Signed)
Barnet Glasgow Martin,acting as a Education administrator for [name], MD.,have documented all relevant documentation on the behalf of [name], MD,as directed by  [name], MD while in the presence of [name], MD.    Subjective:     Patient ID: [name] , female    DOB: 1941-06-22 , 82 y.o.   MRN: [id number]   Chief Complaint  Patient presents with   Diabetes    HPI  Patient presents today for a diabetes check.  She is currently on Ozempic. She states she believes the Ozempic is doing well. She does feel fatigued. She is not sure if it is from the medication. She does admit she feels stressed. She is dealing with some family issues that are weighing heavy on her heart.   BP Readings from Last 3 Encounters: 08/23/22 : 128/70 07/19/22 : 124/72 07/04/22 : 120/64    Diabetes She presents for her follow-up diabetic visit. She has type 2 diabetes mellitus. Her disease course has been stable. There are no hypoglycemic associated symptoms. There are no diabetic associated symptoms. Pertinent negatives for diabetes include no blurred vision, no polydipsia, no polyphagia and no polyuria. There are no hypoglycemic complications. Risk factors for coronary artery disease include diabetes mellitus, dyslipidemia, hypertension, obesity, post-menopausal and sedentary lifestyle. She participates in exercise intermittently. Eye exam is current.  Hypertension This is a chronic problem. The current episode started more than 1 year ago. The problem has been gradually improving since onset. The problem is controlled. Pertinent negatives include no blurred vision. Agents associated with hypertension include thyroid hormones. Risk factors for coronary artery disease include dyslipidemia, obesity, post-menopausal state and sedentary lifestyle. Past treatments include angiotensin blockers. The current treatment provides moderate improvement. Compliance problems include exercise.      Past Medical  History:  Diagnosis Date   Arthritis    RIGHT KNEE PAIN AND OA   Chest pain 05/03/2015   Diabetes mellitus without complication (HCC)    Elevated cholesterol    GERD (gastroesophageal reflux disease)    History of kidney stones    Hypertension    Hypothyroidism    IBS (irritable bowel syndrome)    Localized swelling of both lower legs    Rotator cuff disorder    BOTH SHOULDERS - NO SURGERY - AND STATES LIMITATIONS IN ARM MOVEMENT   Shortness of breath 05/03/2015   Swelling    CHRONIC SWELLING RIGHT HAND AND BOTH FEET AND BOTH LEGS   Vertigo      Family History  Problem Relation Age of Onset   Diabetes Mother    Dementia Mother    Diabetes Father    Diabetes Sister    Breast cancer Sister    Kidney cancer Sister    Lung cancer Sister    Lung cancer Maternal Aunt    Pancreatic cancer Maternal Aunt    Cancer Other      Current Outpatient Medications:    albuterol (VENTOLIN HFA) 108 (90 Base) MCG/ACT inhaler, Inhale 1 puff into the lungs as needed., Disp: , Rfl:    aspirin EC 81 MG tablet, Take 81 mg by mouth daily. Swallow whole., Disp: , Rfl:    bimatoprost (LUMIGAN) 0.01 % SOLN, Place 1 drop into both eyes at bedtime. , Disp: , Rfl:    Blood Glucose Monitoring Suppl (ONETOUCH VERIO FLEX SYSTEM) w/Device KIT, USE TO CHECK BLOOD SUGAR 3  TIMES DAILY, Disp: 1 kit, Rfl: 1   Carboxymethylcellulose Sodium (REFRESH LIQUIGEL)  1 % GEL, Place 1 drop into both eyes at bedtime., Disp: , Rfl:    CRESTOR 20 MG tablet, TAKE 1 TABLET BY MOUTH AT  BEDTIME, Disp: 90 tablet, Rfl: 2   famotidine (PEPCID) 20 MG tablet, Take 20 mg by mouth daily with breakfast., Disp: , Rfl:    furosemide (LASIX) 20 MG tablet, TAKE 1 TABLET BY MOUTH IN THE  MORNING, Disp: 90 tablet, Rfl: 3   glucose blood (ONETOUCH VERIO) test strip, USE AS INSTRUCTED TO CHECK  BLOOD SUGAR 3 TIMES DAILY, Disp: 300 strip, Rfl: 3   Lancets (ONETOUCH DELICA PLUS [id number]) MISC, USE ONE LANCET 3 TIMES  DAILY BEFORE MEALS, Disp:  300 each, Rfl: 3   levothyroxine (SYNTHROID) 50 MCG tablet, Take 1 tablet (50 mcg total) by mouth daily., Disp: 90 tablet, Rfl: 1   Lifitegrast (XIIDRA) 5 % SOLN, Place 1 drop into both eyes daily. xiidra, Disp: , Rfl:    magnesium oxide (MAG-OX) 400 (240 Mg) MG tablet, TAKE 1 TABLET BY MOUTH EVERY DAY AFTER SUPPER, Disp: 90 tablet, Rfl: 1   Multiple Vitamins-Minerals (MULTIVITAMIN WITH MINERALS) tablet, Take 1 tablet by mouth daily. Centrum Silver, Disp: , Rfl:    olmesartan (BENICAR) 40 MG tablet, Take 1 tablet (40 mg total) by mouth daily., Disp: 90 tablet, Rfl: 1   OVER THE COUNTER MEDICATION, Apply 1 Application topically daily. Glucosamine  Use on Hand, Disp: , Rfl:    polyethylene glycol (MIRALAX / GLYCOLAX) packet, Take 17 g by mouth daily., Disp: , Rfl:    triamcinolone cream (KENALOG) 0.1 %, APPLY TO AFFECTED AREA TWICE DAILY AS NEEDED, Disp: 45 g, Rfl: 0   vitamin B-6 (PYRIDOXINE) 25 MG tablet, Take 25 mg by mouth daily., Disp: , Rfl:    Semaglutide,0.25 or 0.5MG/DOS, 2 MG/3ML SOPN, Inject 0.51m sq once weekly, Disp: 3 mL, Rfl: 1   Allergies  Allergen Reactions   Codeine Nausea And Vomiting   Tramadol Nausea And Vomiting   Macrobid [Nitrofurantoin Macrocrystal] Rash     Review of Systems  Constitutional: Negative.   Eyes:  Negative for blurred vision.  Respiratory: Negative.    Cardiovascular: Negative.   Endocrine: Negative for polydipsia, polyphagia and polyuria.  Neurological: Negative.   Psychiatric/Behavioral:  Positive for dysphoric mood.      Today's Vitals   08/23/22 1029  BP: 128/70  Pulse: 78  Temp: 97.9 F (36.6 C)  TempSrc: Oral  Weight: 211 lb 12.8 oz (96.1 kg)  Height: 5' 3"$  (1.6 m)  PainSc: 6   PainLoc: Arm   Body mass index is 37.52 kg/m.  Wt Readings from Last 3 Encounters:  08/23/22 211 lb 12.8 oz (96.1 kg)  07/19/22 215 lb 9.6 oz (97.8 kg)  07/04/22 215 lb 6.4 oz (97.7 kg)    Objective:  Physical Exam Vitals and nursing note reviewed.   Constitutional:      Appearance: Normal appearance.  HENT:     Head: Normocephalic and atraumatic.     Nose:     Comments: Masked     Mouth/Throat:     Comments: Masked  Eyes:     Extraocular Movements: Extraocular movements intact.  Cardiovascular:     Rate and Rhythm: Normal rate and regular rhythm.     Heart sounds: Normal heart sounds.  Pulmonary:     Effort: Pulmonary effort is normal.     Breath sounds: Normal breath sounds.  Musculoskeletal:     Cervical back: Normal range of motion.  Skin:  General: Skin is warm.  Neurological:     General: No focal deficit present.     Mental Status: She is alert.  Psychiatric:        Mood and Affect: Mood normal.        Behavior: Behavior normal.         Assessment And Plan:     1. Dyslipidemia associated with type 2 diabetes mellitus ([place]) Comments: Chronic, she will c/w Ozempic 2.58m weekly. She will rto in 2 months for her next a1c check.  2. Adjustment disorder with depressed mood Comments: She agrees to referral for therapy. She does not feel medications are needed at this time. PHQ-9 reviewed. - Ambulatory referral to Psychology  3. Essential hypertension Comments: Chronic, controlled. She will c/w olmesartan 415mdaily.  4. Aortic atherosclerosis (HCC) Comments: Chronic, LDL goal < 70. She will c/w rosuvastatin 2048maily.  5. Class 2 severe obesity due to excess calories with serious comorbidity and body mass index (BMI) of 37.0 to 37.9 in adult [facility]: She was congratulated on 4lb weight loss. Encouraged to aim for at least 150 minutes of exercise/week.   Patient was given opportunity to ask questions. Patient verbalized understanding of the plan and was able to repeat key elements of the plan. All questions were answered to their satisfaction.   I, RobMaximino GreenlandD, have reviewed all documentation for this visit. The documentation on 08/23/22 for the exam, diagnosis, procedures, and orders are all  accurate and complete.   IF YOU HAVE BEEN REFERRED TO A SPECIALIST, IT MAY TAKE 1-2 WEEKS TO SCHEDULE/PROCESS THE REFERRAL. IF YOU HAVE NOT HEARD FROM US/SPECIALIST IN TWO WEEKS, PLEASE GIVE [place] AT [phone] X [phone].   THE PATIENT IS ENCOURAGED TO PRACTICE SOCIAL DISTANCING DUE TO THE COVID-19 PANDEMIC.

## 2022-08-27 ENCOUNTER — Other Ambulatory Visit: Payer: Self-pay | Admitting: Internal Medicine

## 2022-08-31 ENCOUNTER — Other Ambulatory Visit: Payer: Medicare Other | Admitting: Pharmacist

## 2022-08-31 NOTE — Patient Instructions (Signed)
Ms. Morgese,   It was great talking to you today!  My pharmacy technician will mail the pages of the [facility] patient assistance application to you. Please complete and return in the return envelope provided.   Check your blood sugars twice daily:  1) Fasting, first thing in the morning before breakfast and  2) 2 hours after your largest meal.   For a goal A1c of less than 7%, goal fasting readings are less than 130 and goal 2 hour after meal readings are less than 180.    Take care!  Catie Hedwig Morton, PharmD, [place], [facility] [phone]

## 2022-08-31 NOTE — Progress Notes (Signed)
08/31/2022 Name: [name] MRN: [id number] DOB: 17-Sep-1940  Chief Complaint  Patient presents with   Medication Management   Diabetes   Hypertension   Hyperlipidemia    [name] is a 82 y.o. year old female who presented for a telephone visit.   They were referred to the pharmacist by their Case Management Team  for assistance in managing diabetes.    Subjective:  Care Team: Primary Care Provider: Glendale Chard, MD ; Next Scheduled Visit: 10/22/22  Medication Access/Adherence  Current Pharmacy:  [facility] - [place], [place] [facility] [place] [address] [place] [address] Phone: [phone] Fax: [phone]  [place], [place] [address] [place] [address] Phone: [phone] Fax: [id number] - [place], [place] [address] Phone: [phone] Fax: [phone]   Patient reports affordability concerns with their medications: Yes  Patient reports access/transportation concerns to their pharmacy: No  Patient reports adherence concerns with their medications:  No    Reports that her brand Synthroid, Benicar, and Crestor are expensive, but she declines to change to generics. Notes that she has heard Ozempic is expensive as well, but has been using samples provided to her by the clinic.    Diabetes:  Current medications: Ozempic 0.25 mg weekly  Current glucose readings: 120-130s; occasionally post prandials in 100  Patient denies hypoglycemic s/sx including dizziness, shakiness, sweating. Patient denies hyperglycemic symptoms including polyuria, polydipsia, polyphagia, nocturia, neuropathy, blurred vision.  Hypertension:  Current medications: olmesartan 40 mg daily, furosemide 20 mg daily  Patient has a validated, automated, upper arm  home BP cuff Current blood pressure readings readings: 124/60; pulse 82   Patient denies hypotensive s/sx including dizziness, lightheadedness.  Patient denies hypertensive symptoms including headache, chest pain, shortness of breath  Hyperlipidemia/ASCVD Risk Reduction  Current lipid lowering medications: rosuvastatin 40 mg daily  Antiplatelet regimen: aspirin 81 mg daily  Objective:  Lab Results  Component Value Date   HGBA1C 7.1 (H) 06/26/2022    Lab Results  Component Value Date   CREATININE 0.68 07/19/2022   BUN 15 07/19/2022   NA 143 07/19/2022   K 4.7 07/19/2022   CL 105 07/19/2022   CO2 25 07/19/2022    Lab Results  Component Value Date   CHOL 149 01/16/2022   HDL 60 01/16/2022   LDLCALC 74 01/16/2022   TRIG 78 01/16/2022   CHOLHDL 2.5 01/16/2022    Medications Reviewed Today     Reviewed by Glendale Chard, MD (Physician) on 08/23/22 at 1050  Med List Status: <None>   Medication Order Taking? Sig Documenting Provider Last Dose Status Informant  albuterol (VENTOLIN HFA) 108 (90 Base) MCG/ACT inhaler [id number] Yes Inhale 1 puff into the lungs as needed. [provider] Taking Active   aspirin EC 81 MG tablet [id number] Yes Take 81 mg by mouth daily. Swallow whole. [provider] Taking Active Self  bimatoprost (LUMIGAN) 0.01 % SOLN [id number] Yes Place 1 drop into both eyes at bedtime.  [provider] Taking Active Self  Blood Glucose Monitoring Suppl (ONETOUCH VERIO FLEX SYSTEM) w/Device KIT [id number] Yes USE TO CHECK BLOOD SUGAR 3  TIMES DAILY Glendale Chard, MD Taking Active Self  Carboxymethylcellulose Sodium (REFRESH LIQUIGEL) 1 % GEL [id number] Yes Place 1  drop into both eyes at bedtime. [provider] Taking Active Self  CRESTOR 20 MG tablet [id number] Yes TAKE 1 TABLET BY MOUTH AT  BEDTIME Glendale Chard, MD Taking Active Self  Empagliflozin-metFORMIN HCl ER (SYNJARDY XR) 04-999 MG TB24 [id number] No Take 1 tablet by  mouth daily at 12 noon.  Patient not taking: Reported on 07/19/2022   [provider] Not Taking Active   famotidine (PEPCID) 20 MG tablet [id number] Yes Take 20 mg by mouth daily with breakfast. [provider] Taking Active Self  furosemide (LASIX) 20 MG tablet [id number] Yes TAKE 1 TABLET BY MOUTH IN THE  Wende Neighbors, MD Taking Active   glucose blood [facility] VERIO) test strip [id number] Yes USE AS INSTRUCTED TO CHECK  BLOOD SUGAR 3 TIMES DAILY Glendale Chard, MD Taking Active Self  Lancets (ONETOUCH DELICA PLUS [id number]) [place] [id number] Yes USE ONE LANCET 3 TIMES  DAILY BEFORE MEALS Glendale Chard, MD Taking Active Self  levothyroxine (SYNTHROID) 50 MCG tablet [id number] Yes Take 1 tablet (50 mcg total) by mouth daily. Glendale Chard, MD Taking Active   Lifitegrast Shirley Friar) 5 % Bailey Mech [id number] Yes Place 1 drop into both eyes daily. xiidra [provider] Taking Active Self  magnesium oxide (MAG-OX) 400 (240 Mg) MG tablet [id number] Yes TAKE 1 TABLET BY MOUTH EVERY DAY AFTER SUPPER Glendale Chard, MD Taking Active Self  Multiple Vitamins-Minerals (MULTIVITAMIN WITH MINERALS) tablet [id number] Yes Take 1 tablet by mouth daily. Centrum Silver [provider] Taking Active Self  olmesartan (BENICAR) 40 MG tablet [id number] Yes Take 1 tablet (40 mg total) by mouth daily. Glendale Chard, MD Taking Active   OVER THE COUNTER MEDICATION [id number] Yes Apply 1 Application topically daily. Glucosamine  Use on Hand [provider] Taking Active Self  polyethylene glycol (MIRALAX / GLYCOLAX) packet [id number] Yes Take 17 g by mouth daily. [provider] Taking Active Self  Semaglutide,0.25 or 0.5MG/DOS, 2 MG/3ML SOPN [id number] Yes Inject 0.48m sq once weekly SGlendale Chard MD Taking Active   triamcinolone cream (KENALOG) 0.1 % [id number] APPLY TO AFFECTED AREA TWICE DAILY AS NEEDED SGlendale Chard MD Taking Active   vitamin B-6 (PYRIDOXINE) 25 MG  tablet [id number] Take 25 mg by mouth daily. [provider] Taking Active Self              Assessment/Plan:   Diabetes: - Currently controlled per last A1c, likely consider a more relaxed goal of <7.5 - Reviewed long term cardiovascular and renal outcomes of uncontrolled blood sugar - Reviewed goal A1c, goal fasting, and goal 2 hour post prandial glucose - Recommend to continue current regimen. Reviewed mechanism of action, side effects. Discussed very rare risk of pancreatitis as patient asked about this - Recommend to check glucose twice daily, fasting and 2 hour post prandial - Meets financial criteria for Ozempic patient assistance program through [employer] Will collaborate with provider, CPhT, and patient to pursue assistance.   Hypertension: - Currently controlled - Reviewed long term cardiovascular and renal outcomes of uncontrolled blood pressure - Reviewed appropriate blood pressure monitoring technique and reviewed goal blood pressure. Recommended to check home blood pressure and heart rate periodically - Recommend to continue current regimen at this time  Hyperlipidemia/ASCVD Risk Reduction: - Currently controlled.  - Reviewed long term complications of uncontrolled cholesterol - Recommend to continue current regimen at this time    Follow Up Plan: phone call in 8 weeks  Catie T.Jodi Mourning PharmD, BCACP, [place]  [phone]    

## 2022-09-18 ENCOUNTER — Telehealth: Payer: Self-pay

## 2022-09-18 NOTE — Telephone Encounter (Signed)
I Have received pt portion of PAP application for OZEMPIC([place]) I have FAXED PROVIDER [place], [place] [facility] [phone]

## 2022-09-20 NOTE — Telephone Encounter (Signed)
I have received both pt and provider portion just need pt income verification I tried calling pt but got no answer. I will still send to company to see if they will process with out. ..    [facility] [phone]

## 2022-10-04 LAB — HM DIABETES EYE EXAM

## 2022-10-22 ENCOUNTER — Ambulatory Visit (INDEPENDENT_AMBULATORY_CARE_PROVIDER_SITE_OTHER): Payer: Medicare Other | Admitting: Internal Medicine

## 2022-10-22 VITALS — BP 132/70 | HR 79 | Temp 98.2°F | Ht 63.0 in | Wt 212.0 lb

## 2022-10-22 DIAGNOSIS — E1169 Type 2 diabetes mellitus with other specified complication: Secondary | ICD-10-CM | POA: Diagnosis not present

## 2022-10-22 DIAGNOSIS — Z0000 Encounter for general adult medical examination without abnormal findings: Secondary | ICD-10-CM

## 2022-10-22 DIAGNOSIS — R197 Diarrhea, unspecified: Secondary | ICD-10-CM

## 2022-10-22 DIAGNOSIS — B353 Tinea pedis: Secondary | ICD-10-CM

## 2022-10-22 DIAGNOSIS — I700 Atherosclerosis of aorta: Secondary | ICD-10-CM

## 2022-10-22 DIAGNOSIS — E785 Hyperlipidemia, unspecified: Secondary | ICD-10-CM

## 2022-10-22 DIAGNOSIS — I10 Essential (primary) hypertension: Secondary | ICD-10-CM

## 2022-10-22 DIAGNOSIS — Z6837 Body mass index (BMI) 37.0-37.9, adult: Secondary | ICD-10-CM

## 2022-10-22 DIAGNOSIS — F5101 Primary insomnia: Secondary | ICD-10-CM

## 2022-10-22 LAB — POCT URINALYSIS DIPSTICK
Bilirubin, UA: NEGATIVE
Glucose, UA: NEGATIVE
Ketones, UA: NEGATIVE
Nitrite, UA: NEGATIVE
Protein, UA: NEGATIVE
Spec Grav, UA: 1.015 (ref 1.010–1.025)
Urobilinogen, UA: 0.2 E.U./dL
pH, UA: 5.5 (ref 5.0–8.0)

## 2022-10-22 MED ORDER — NYSTATIN 100000 UNIT/GM EX POWD: 1.0000 | Freq: Three times a day (TID) | CUTANEOUS | 0 refills | Status: AC

## 2022-10-22 NOTE — Patient Instructions (Addendum)
[facility] [address], [place], [place]  Health Maintenance, Female Adopting a healthy lifestyle and getting preventive care are important in promoting health and wellness. Ask your health care provider about: The right schedule for you to have regular tests and exams. Things you can do on your own to prevent diseases and keep yourself healthy. What should I know about diet, weight, and exercise? Eat a healthy diet  Eat a diet that includes plenty of vegetables, fruits, low-fat dairy products, and lean protein. Do not eat a lot of foods that are high in solid fats, added sugars, or sodium. Maintain a healthy weight Body mass index (BMI) is used to identify weight problems. It estimates body fat based on height and weight. Your health care provider can help determine your BMI and help you achieve or maintain a healthy weight. Get regular exercise Get regular exercise. This is one of the most important things you can do for your health. Most adults should: Exercise for at least 150 minutes each week. The exercise should increase your heart rate and make you sweat (moderate-intensity exercise). Do strengthening exercises at least twice a week. This is in addition to the moderate-intensity exercise. Spend less time sitting. Even light physical activity can be beneficial. Watch cholesterol and blood lipids Have your blood tested for lipids and cholesterol at 84 years of age, then have this test every 5 years. Have your cholesterol levels checked more often if: Your lipid or cholesterol levels are high. You are older than 84 years of age. You are at high risk for heart disease. What should I know about cancer screening? Depending on your health history and family history, you may need to have cancer screening at various ages. This may include screening for: Breast cancer. Cervical cancer. Colorectal cancer. Skin cancer. Lung cancer. What should I know about heart disease, diabetes, and high  blood pressure? Blood pressure and heart disease High blood pressure causes heart disease and increases the risk of stroke. This is more likely to develop in people who have high blood pressure readings or are overweight. Have your blood pressure checked: Every 3-5 years if you are 91-84 years of age. Every year if you are 26 years old or older. Diabetes Have regular diabetes screenings. This checks your fasting blood sugar level. Have the screening done: Once every three years after age 55 if you are at a normal weight and have a low risk for diabetes. More often and at a younger age if you are overweight or have a high risk for diabetes. What should I know about preventing infection? Hepatitis B If you have a higher risk for hepatitis B, you should be screened for this virus. Talk with your health care provider to find out if you are at risk for hepatitis B infection. Hepatitis C Testing is recommended for: Everyone born from [birth year] through 1967. Anyone with known risk factors for hepatitis C. Sexually transmitted infections (STIs) Get screened for STIs, including gonorrhea and chlamydia, if: You are sexually active and are younger than 84 years of age. You are older than 84 years of age and your health care provider tells you that you are at risk for this type of infection. Your sexual activity has changed since you were last screened, and you are at increased risk for chlamydia or gonorrhea. Ask your health care provider if you are at risk. Ask your health care provider about whether you are at high risk for HIV. Your health care provider may  recommend a prescription medicine to help prevent HIV infection. If you choose to take medicine to prevent HIV, you should first get tested for HIV. You should then be tested every 3 months for as long as you are taking the medicine. Pregnancy If you are about to stop having your period (premenopausal) and you may become pregnant, seek counseling  before you get pregnant. Take 400 to 800 micrograms (mcg) of folic acid every day if you become pregnant. Ask for birth control (contraception) if you want to prevent pregnancy. Osteoporosis and menopause Osteoporosis is a disease in which the bones lose minerals and strength with aging. This can result in bone fractures. If you are 34 years old or older, or if you are at risk for osteoporosis and fractures, ask your health care provider if you should: Be screened for bone loss. Take a calcium or vitamin D supplement to lower your risk of fractures. Be given hormone replacement therapy (HRT) to treat symptoms of menopause. Follow these instructions at home: Alcohol use Do not drink alcohol if: Your health care provider tells you not to drink. You are pregnant, may be pregnant, or are planning to become pregnant. If you drink alcohol: Limit how much you have to: 0-1 drink a day. Know how much alcohol is in your drink. In the [place] one drink equals one 12 oz bottle of beer (355 mL), one 5 oz glass of wine (148 mL), or one 1 oz glass of hard liquor (44 mL). Lifestyle Do not use any products that contain nicotine or tobacco. These products include cigarettes, chewing tobacco, and vaping devices, such as e-cigarettes. If you need help quitting, ask your health care provider. Do not use street drugs. Do not share needles. Ask your health care provider for help if you need support or information about quitting drugs. General instructions Schedule regular health, dental, and eye exams. Stay current with your vaccines. Tell your health care provider if: You often feel depressed. You have ever been abused or do not feel safe at home. Summary Adopting a healthy lifestyle and getting preventive care are important in promoting health and wellness. Follow your health care provider's instructions about healthy diet, exercising, and getting tested or screened for diseases. Follow your health care  provider's instructions on monitoring your cholesterol and blood pressure. This information is not intended to replace advice given to you by your health care provider. Make sure you discuss any questions you have with your health care provider. Document Revised: 11/14/2020 Document Reviewed: 11/14/2020 Elsevier Patient Education  [place].

## 2022-10-22 NOTE — Progress Notes (Signed)
[name],acting as a Neurosurgeon for Stephanie Aliment, MD.,have documented all relevant documentation on the behalf of Stephanie Aliment, MD,as directed by  Stephanie Aliment, MD while in the presence of Stephanie Aliment, MD.   Subjective:     [name]: [name] , female    DOB: 29-Jul-1940 , 82 y.o.   MRN: [id number]   Chief Complaint  Stephanie presents with   Annual Exam   Diabetes   Hypertension    HPI  She is here today for a full physical examination.  She is no longer followed by GYN. She denies headaches, chest pain and shortness of breath. Stephanie reports she saw her Cardiologist in January 2023. Stephanie reports she is having a lot of bowel movements, she thinks it may be related to the Ozempic.     BP Readings from Last 3 Encounters: 10/22/22 : 132/70 08/23/22 : 128/70 07/19/22 : 124/72    Diabetes She presents for her follow-up diabetic visit. She has type 2 diabetes mellitus. Her disease course has been stable. There are no hypoglycemic associated symptoms. Pertinent negatives for hypoglycemia include no dizziness or headaches. There are no diabetic associated symptoms. Pertinent negatives for diabetes include no blurred vision, no chest pain, no fatigue, no polydipsia, no polyphagia and no polyuria. There are no hypoglycemic complications. Risk factors for coronary artery disease include diabetes mellitus, dyslipidemia, hypertension, obesity, post-menopausal and sedentary lifestyle. She participates in exercise intermittently. An ACE inhibitor/angiotensin II receptor blocker is being taken. Eye exam is current.  Hypertension This is a chronic problem. The current episode started more than 1 year ago. The problem has been gradually improving since onset. The problem is controlled. Pertinent negatives include no blurred vision, chest pain, headaches, palpitations or shortness of breath. Agents associated with hypertension include thyroid hormones. Risk factors for coronary  artery disease include dyslipidemia, obesity, post-menopausal state and sedentary lifestyle. Past treatments include angiotensin blockers. The current treatment provides moderate improvement. Compliance problems include exercise.      Past Medical History:  Diagnosis Date   Arthritis    RIGHT KNEE PAIN AND OA   Chest pain 05/03/2015   Diabetes mellitus without complication    Elevated cholesterol    GERD (gastroesophageal reflux disease)    History of kidney stones    Hypertension    Hypothyroidism    IBS (irritable bowel syndrome)    Localized swelling of both lower legs    Rotator cuff disorder    BOTH SHOULDERS - NO SURGERY - AND STATES LIMITATIONS IN ARM MOVEMENT   Shortness of breath 05/03/2015   Swelling    CHRONIC SWELLING RIGHT HAND AND BOTH FEET AND BOTH LEGS   Vertigo      Family History  Problem Relation Age of Onset   Diabetes Mother    Dementia Mother    Diabetes Father    Diabetes Sister    Breast cancer Sister    Kidney cancer Sister    Lung cancer Sister    Lung cancer Maternal Aunt    Pancreatic cancer Maternal Aunt    Cancer Other      Current Outpatient Medications:    aspirin EC 81 MG tablet, Take 81 mg by mouth daily. Swallow whole., Disp: , Rfl:    bimatoprost (LUMIGAN) 0.01 % SOLN, Place 1 drop into both eyes at bedtime. , Disp: , Rfl:    Blood Glucose Monitoring Suppl (ONETOUCH VERIO FLEX SYSTEM) w/Device KIT, USE TO CHECK BLOOD SUGAR 3  TIMES DAILY, Disp: 1 kit, Rfl: 1   Carboxymethylcellulose Sodium (REFRESH LIQUIGEL) 1 % GEL, Place 1 drop into both eyes at bedtime., Disp: , Rfl:    CRESTOR 20 MG tablet, TAKE 1 TABLET BY MOUTH AT  BEDTIME, Disp: 90 tablet, Rfl: 2   famotidine (PEPCID) 20 MG tablet, Take 20 mg by mouth daily with breakfast., Disp: , Rfl:    furosemide (LASIX) 20 MG tablet, TAKE 1 TABLET BY MOUTH IN THE  MORNING, Disp: 90 tablet, Rfl: 3   glucose blood (ONETOUCH VERIO) test strip, USE AS INSTRUCTED TO CHECK  BLOOD SUGAR 3 TIMES  DAILY, Disp: 300 strip, Rfl: 3   Lancets (ONETOUCH DELICA PLUS LANCET33G) MISC, USE ONE LANCET 3 TIMES  DAILY BEFORE MEALS, Disp: 300 each, Rfl: 3   levothyroxine (SYNTHROID) 50 MCG tablet, Take 1 tablet (50 mcg total) by mouth daily., Disp: 90 tablet, Rfl: 1   Lifitegrast (XIIDRA) 5 % SOLN, Place 1 drop into both eyes daily. xiidra, Disp: , Rfl:    magnesium oxide (MAG-OX) 400 (240 Mg) MG tablet, TAKE 1 TABLET BY MOUTH EVERY DAY AFTER SUPPER, Disp: 90 tablet, Rfl: 1   Multiple Vitamins-Minerals (MULTIVITAMIN WITH MINERALS) tablet, Take 1 tablet by mouth daily. Centrum Silver, Disp: , Rfl:    nystatin powder, Apply 1 Application topically 3 (three) times daily., Disp: 45 g, Rfl: 0   olmesartan (BENICAR) 40 MG tablet, Take 1 tablet (40 mg total) by mouth daily., Disp: 90 tablet, Rfl: 1   polyethylene glycol (MIRALAX / GLYCOLAX) packet, Take 17 g by mouth daily., Disp: , Rfl:    triamcinolone cream (KENALOG) 0.1 %, APPLY TO AFFECTED AREA TWICE DAILY AS NEEDED (Stephanie not taking: Reported on 10/23/2022), Disp: 45 g, Rfl: 0   vitamin B-6 (PYRIDOXINE) 25 MG tablet, Take 25 mg by mouth daily., Disp: , Rfl:    albuterol (VENTOLIN HFA) 108 (90 Base) MCG/ACT inhaler, Inhale 1 puff into the lungs as needed., Disp: , Rfl:    Allergies  Allergen Reactions   Codeine Nausea And Vomiting   Tramadol Nausea And Vomiting   Macrobid [Nitrofurantoin Macrocrystal] Rash      The Stephanie states she uses status post hysterectomy for birth control. Last LMP was No LMP recorded. Stephanie has had a hysterectomy.. Negative for Dysmenorrhea. Negative for: breast discharge, breast lump(s), breast pain and breast self exam. Associated symptoms include abnormal vaginal bleeding. Pertinent negatives include abnormal bleeding (hematology), anxiety, decreased libido, depression, difficulty falling sleep, dyspareunia, history of infertility, nocturia, sexual dysfunction, sleep disturbances, urinary incontinence, urinary urgency,  vaginal discharge and vaginal itching. Diet regular.The Stephanie states her exercise level is  intermittent.  . The Stephanie's tobacco use is:  Social History   Tobacco Use  Smoking Status Never  Smokeless Tobacco Never  . She has been exposed to passive smoke. The Stephanie's alcohol use is:  Social History   Substance and Sexual Activity  Alcohol Use Yes   Comment: Rare    Review of Systems  Constitutional: Negative.  Negative for fatigue.  HENT: Negative.    Eyes: Negative.  Negative for blurred vision.  Respiratory: Negative.  Negative for shortness of breath.   Cardiovascular: Negative.  Negative for chest pain and palpitations.  Gastrointestinal:  Positive for diarrhea.       She c/o loose stools, sx started 2-3 weeks ago. She is not sure what may have triggered her sx. She has not seen any blood in her stools. Thinks sx could be due to Ozempic.  She has been on this since Feb 2024.  Endocrine: Negative.  Negative for polydipsia, polyphagia and polyuria.  Genitourinary: Negative.   Musculoskeletal: Negative.   Skin: Negative.   Neurological: Negative.  Negative for dizziness and headaches.  Psychiatric/Behavioral: Negative.       Today's Vitals   10/22/22 1050  BP: 132/70  Pulse: 79  Temp: 98.2 F (36.8 C)  TempSrc: Oral  Weight: 212 lb (96.2 kg)  Height:  (1.6 m)  PainSc: 0-No pain   Body mass index is 37.55 kg/m.  Wt Readings from Last 3 Encounters:  10/22/22 212 lb (96.2 kg)  08/23/22 211 lb 12.8 oz (96.1 kg)  07/19/22 215 lb 9.6 oz (97.8 kg)    Objective:  Physical Exam Vitals and nursing note reviewed.  Constitutional:      Appearance: Normal appearance.  HENT:     Head: Normocephalic and atraumatic.     Right Ear: Tympanic membrane, ear canal and external ear normal. There is no impacted cerumen.     Left Ear: Tympanic membrane, ear canal and external ear normal. There is no impacted cerumen.     Mouth/Throat:     Mouth: Mucous membranes are  dry.     Pharynx: Oropharynx is clear.  Eyes:     Extraocular Movements: Extraocular movements intact.  Cardiovascular:     Rate and Rhythm: Normal rate and regular rhythm.     Pulses:          Dorsalis pedis pulses are 2+ on the right side and 2+ on the left side.     Heart sounds: Normal heart sounds.  Pulmonary:     Effort: Pulmonary effort is normal.     Breath sounds: Normal breath sounds.  Abdominal:     General: Bowel sounds are normal.     Palpations: Abdomen is soft.     Comments: Obese, soft  Genitourinary:    Comments: Deferred  Musculoskeletal:     Cervical back: Normal range of motion.  Feet:     Right foot:     Protective Sensation: 5 sites tested.  5 sites sensed.     Skin integrity: Dry skin present.     Left foot:     Protective Sensation: 5 sites tested.  5 sites sensed.     Skin integrity: Dry skin present.     Comments: Scaly skin b/w toes Skin:    General: Skin is warm.  Neurological:     General: No focal deficit present.     Mental Status: She is alert.  Psychiatric:        Mood and Affect: Mood normal.        Behavior: Behavior normal.       Assessment And Plan:     1. Encounter for general adult medical examination w/o abnormal findings Comments: A full exam was performed. Importance of monthly self breast exams was discussed with the Stephanie.  Stephanie IS ADVISED TO GET 30-45 MINUTES REGULAR EXERCISE NO LESS THAN FOUR TO FIVE DAYS PER WEEK - BOTH WEIGHTBEARING EXERCISES AND AEROBIC ARE RECOMMENDED.  Stephanie IS ADVISED TO FOLLOW A HEALTHY DIET WITH AT LEAST SIX FRUITS/VEGGIES PER DAY, DECREASE INTAKE OF RED MEAT, AND TO INCREASE FISH INTAKE TO TWO DAYS PER WEEK.  MEATS/FISH SHOULD NOT BE FRIED, BAKED OR BROILED IS PREFERABLE.  IT IS ALSO IMPORTANT TO CUT BACK ON YOUR SUGAR INTAKE. PLEASE AVOID ANYTHING WITH ADDED SUGAR, CORN SYRUP OR OTHER SWEETENERS. IF YOU MUST USE A SWEETENER,  YOU CAN TRY STEVIA. IT IS ALSO IMPORTANT TO AVOID ARTIFICIALLY SWEETENERS  AND DIET BEVERAGES. LASTLY, I SUGGEST WEARING SPF 50 SUNSCREEN ON EXPOSED PARTS AND ESPECIALLY WHEN IN THE DIRECT SUNLIGHT FOR AN EXTENDED PERIOD OF TIME.  PLEASE AVOID FAST FOOD RESTAURANTS AND INCREASE YOUR WATER INTAKE.  2. Dyslipidemia associated with type 2 diabetes mellitus Comments: Chronic, diabetic foot exam was performed.  She will skip this weeks dose of Ozempic. Reminded her to follow dietary guidelines. She will rto in 3-4 months for f/u.  I DISCUSSED WITH THE Stephanie AT LENGTH REGARDING THE GOALS OF GLYCEMIC CONTROL AND POSSIBLE LONG-TERM COMPLICATIONS.  I  ALSO STRESSED THE IMPORTANCE OF COMPLIANCE WITH HOME GLUCOSE MONITORING, DIETARY RESTRICTIONS INCLUDING AVOIDANCE OF SUGARY DRINKS/PROCESSED FOODS,  ALONG WITH REGULAR EXERCISE.  I  ALSO STRESSED THE IMPORTANCE OF ANNUAL EYE EXAMS, SELF FOOT CARE AND COMPLIANCE WITH OFFICE VISITS.  - CBC - CMP14+EGFR - Lipid panel - Hemoglobin A1c  3. Essential hypertension Comments: Chronic, fair control. Goal BP<120/80.  She will c/w olmesartan 40mg  and furosemide 20mg  qd. EKG performed, SR w/ LAFB, voltage criteria for LVH and nonspecific T abnormality.  She is encouraged to follow low sodium diet.  - EKG 12-Lead - Microalbumin / creatinine urine ratio - POCT urinalysis dipstick - CMP14+EGFR - Lipid panel  4. Aortic atherosclerosis Comments: Chronic, LDL goal < 70.  She will c/w ASA 81mg  and rosuvastatin 20mg  daily. She is encouraged to follow heart healthy diet.  5. Diarrhea, unspecified type Comments: Possibly due to Ozempic. She is advised to skip dosing this Thursday to see if her sx improve.  6. Primary insomnia Comments: She is encouraged to try tart cherrry juice nightly. Bedtime snack of blueberries and pumpkin seeds may also be helpful. Will consider rx if sx persist.  7. Tinea pedis of both feet Comments: She was given rx nystatin powder to apply to affected area bid prn. - nystatin powder; Apply 1 Application topically 3  (three) times daily.  Dispense: 45 g; Refill: 0  8. Class 2 severe obesity due to excess calories with serious comorbidity and body mass index (BMI) of 37.0 to 37.9 in adult Comments: She is encouraged to aim for at least 150 minutes of exercise/week, while striving for BMI<30 to decrease cardiac risk.   Stephanie was given opportunity to ask questions. Stephanie verbalized understanding of the plan and was able to repeat key elements of the plan. All questions were answered to their satisfaction.   I, Stephanie Aliment, MD, have reviewed all documentation for this visit. The documentation on 10/22/22 for the exam, diagnosis, procedures, and orders are all accurate and complete.   THE Stephanie IS ENCOURAGED TO PRACTICE SOCIAL DISTANCING DUE TO THE COVID-19 PANDEMIC.

## 2022-10-23 ENCOUNTER — Other Ambulatory Visit: Payer: Medicare Other | Admitting: Pharmacist

## 2022-10-23 LAB — CBC
Hematocrit: 46.9 % — ABNORMAL HIGH (ref 34.0–46.6)
Hemoglobin: 15.9 g/dL (ref 11.1–15.9)
MCH: 30.5 pg (ref 26.6–33.0)
MCHC: 33.9 g/dL (ref 31.5–35.7)
MCV: 90 fL (ref 79–97)
Platelets: 160 10*3/uL (ref 150–450)
RBC: 5.21 x10E6/uL (ref 3.77–5.28)
RDW: 13.5 % (ref 11.7–15.4)
WBC: 4 10*3/uL (ref 3.4–10.8)

## 2022-10-23 LAB — MICROALBUMIN / CREATININE URINE RATIO
Creatinine, Urine: 75.4 mg/dL
Microalb/Creat Ratio: 17 mg/g{creat} (ref 0–29)
Microalbumin, Urine: 12.9 ug/mL

## 2022-10-23 LAB — CMP14+EGFR
ALT: 41 IU/L — ABNORMAL HIGH (ref 0–32)
AST: 27 IU/L (ref 0–40)
Albumin/Globulin Ratio: 1.7 (ref 1.2–2.2)
Albumin: 4 g/dL (ref 3.7–4.7)
Alkaline Phosphatase: 93 IU/L (ref 44–121)
BUN/Creatinine Ratio: 23 (ref 12–28)
BUN: 16 mg/dL (ref 8–27)
Bilirubin Total: 0.7 mg/dL (ref 0.0–1.2)
CO2: 23 mmol/L (ref 20–29)
Calcium: 9.3 mg/dL (ref 8.7–10.3)
Chloride: 104 mmol/L (ref 96–106)
Creatinine, Ser: 0.7 mg/dL (ref 0.57–1.00)
Globulin, Total: 2.3 g/dL (ref 1.5–4.5)
Glucose: 101 mg/dL — ABNORMAL HIGH (ref 70–99)
Potassium: 4 mmol/L (ref 3.5–5.2)
Sodium: 141 mmol/L (ref 134–144)
Total Protein: 6.3 g/dL (ref 6.0–8.5)
eGFR: 86 mL/min/{1.73_m2} (ref >59)

## 2022-10-23 LAB — HEMOGLOBIN A1C
Est. average glucose Bld gHb Est-mCnc: 146 mg/dL
Hgb A1c MFr Bld: 6.7 % — ABNORMAL HIGH (ref 4.8–5.6)

## 2022-10-23 LAB — LIPID PANEL
Chol/HDL Ratio: 2.4 ratio (ref 0.0–4.4)
Cholesterol, Total: 149 mg/dL (ref 100–199)
HDL: 62 mg/dL
LDL Chol Calc (NIH): 73 mg/dL (ref 0–99)
Triglycerides: 69 mg/dL (ref 0–149)
VLDL Cholesterol Cal: 14 mg/dL (ref 5–40)

## 2022-10-23 NOTE — Progress Notes (Unsigned)
10/23/2022 Name: [name] MRN: [id number] DOB: Jun 16, 1941  Chief Complaint  Patient presents with   Medication Management   Diabetes   Hypertension   Hyperlipidemia    [name] is a 82 y.o. year old female who presented for a telephone visit.   They were referred to the pharmacist by their PCP for assistance in managing medication access.   Subjective:  Care Team: Primary Care Provider: [name], MD ; Next Scheduled Visit: 02/28/23  Medication Access/Adherence  Current Pharmacy:  [facility]) - [place], [place] - [address] [facility] [address] [place] [address] [place] [address] Phone: [phone] Fax: [phone]  [facility] - [place], [place] - [address] [place] [address] Phone: [phone] Fax: [phone]  [id number] - [place], [place] - [address] [place] [address] Phone: [phone] Fax: [phone]   Patient reports affordability concerns with their medications: No  Patient reports access/transportation concerns to their pharmacy: No  Patient reports adherence concerns with their medications:  No     Diabetes:  Current medications: Ozempic 0.25 mg weekly - reports she and Dr. Allyne Gee discussed discontinuation of this at last appointment on Monday due to continued nausea.  Medications tried in the past: Jardiance - also reported nausea; prior history of metformin as well, but difficult to tell if IR or XR  Hypertension:   Current medications: olmesartan 40 mg daily, furosemide 20 mg daily    Hyperlipidemia/ASCVD Risk Reduction   Current lipid lowering medications: rosuvastatin 40 mg daily   Antiplatelet regimen: aspirin 81 mg daily   Objective:  Lab Results  Component Value Date   HGBA1C 7.1 (H) 06/26/2022    Lab Results  Component Value Date   CREATININE 0.68  07/19/2022   BUN 15 07/19/2022   NA 143 07/19/2022   K 4.7 07/19/2022   CL 105 07/19/2022   CO2 25 07/19/2022    Lab Results  Component Value Date   CHOL 149 01/16/2022   HDL 60 01/16/2022   LDLCALC 74 01/16/2022   TRIG 78 01/16/2022   CHOLHDL 2.5 01/16/2022    Medications Reviewed Today     Reviewed by Alden Hipp, RPH-CPP (Pharmacist) on 10/23/22 at 1415  Med List Status: <None>   Medication Order Taking? Sig Documenting Provider Last Dose Status Informant  albuterol (VENTOLIN HFA) 108 (90 Base) MCG/ACT inhaler [id number] Yes Inhale 1 puff into the lungs as needed. [provider] Taking Active   aspirin EC 81 MG tablet [id number] Yes Take 81 mg by mouth daily. Swallow whole. [provider] Taking Active Self  bimatoprost (LUMIGAN) 0.01 % SOLN [id number] Yes Place 1 drop into both eyes at bedtime.  [provider] Taking Active Self  Blood Glucose Monitoring Suppl (ONETOUCH VERIO FLEX SYSTEM) w/Device KIT [id number] Yes USE TO CHECK BLOOD SUGAR 3  TIMES DAILY [name], MD Taking Active Self  Carboxymethylcellulose Sodium (REFRESH LIQUIGEL) 1 % GEL [id number] Yes Place 1 drop into both eyes at bedtime. [provider] Taking Active Self  CRESTOR 20 MG tablet [id number] Yes TAKE 1 TABLET BY MOUTH AT  BEDTIME [name], MD Taking Active Self  famotidine (PEPCID) 20 MG tablet [id number] Yes Take 20 mg by mouth daily with breakfast. [provider] Taking Active Self  furosemide (LASIX) 20 MG tablet [id number] Yes  TAKE 1 TABLET BY MOUTH IN THE  Nelly Laurence, MD Taking Active   glucose blood [facility] VERIO) test strip [id number] Yes USE AS INSTRUCTED TO CHECK  BLOOD SUGAR 3 TIMES DAILY [name], MD Taking Active Self  Lancets [facility] K[facility] PLUS [place]) MISC [id number] Yes USE ONE LANCET 3 TIMES  DAILY BEFORE MEALS [name], MD Taking Active Self  levothyroxine (SYNTHROID) 50 MCG tablet [id number] Yes Take 1  tablet (50 mcg total) by mouth daily. [name], MD Taking Active   Lifitegrast Benay Spice) 5 % Criss Rosales [id number] Yes Place 1 drop into both eyes daily. xiidra [provider] Taking Active Self  magnesium oxide (MAG-OX) 400 (240 Mg) MG tablet [id number] Yes TAKE 1 TABLET BY MOUTH EVERY DAY AFTER SUPPER [name], MD Taking Active   Multiple Vitamins-Minerals (MULTIVITAMIN WITH MINERALS) tablet [id number] Yes Take 1 tablet by mouth daily. Centrum Silver [provider] Taking Active Self  nystatin powder [id number] Yes Apply 1 Application topically 3 (three) times daily. [name], MD Taking Active   olmesartan [facility]) 40 MG tablet [id number] Yes Take 1 tablet (40 mg total) by mouth daily. [name], MD Taking Active   polyethylene glycol [facility]) packet [id number] Yes Take 17 g by mouth daily. [provider] Taking Active Self  Semaglutide,0.25 or 0.5MG /DOS, 2 MG/3ML SOPN [id number] No Inject 0.5mg  sq once weekly  Patient not taking: Reported on 10/23/2022   [name], MD Not Taking Active   triamcinolone cream (KENALOG) 0.1 % [id number] No APPLY TO AFFECTED AREA TWICE DAILY AS NEEDED  Patient not taking: Reported on 10/23/2022   [name], MD Not Taking Active   vitamin B-6 (PYRIDOXINE) 25 MG tablet [id number] Yes Take 25 mg by mouth daily. [provider] Taking Active Self              Assessment/Plan:   Diabetes: - Currently controlled per yesterday's A1c; however, Ozempic was discontinued due to stomach upset.  - Reviewed goal A1c, goal fasting, and goal 2 hour post prandial glucose. Recommended to check periodically throughout the week, document, and we will review at next appointment.     Hypertension: - Currently controlled - Recommend to continue current regimen at this time   Hyperlipidemia/ASCVD Risk Reduction: - Currently controlled around goal of <70 - Reviewed long term complications of uncontrolled  cholesterol - Recommend to continue current regimen at this time   Follow Up Plan: phone call in ~ 6-8 weeks  [name], PharmD, BCACP, CPP [facility] [phone]

## 2022-10-24 NOTE — Patient Instructions (Signed)
Ms. Deisher,   It was great talking to you today!   Check your blood sugars several times a week, alternating between:  1) Fasting, first thing in the morning before breakfast and  2) 2 hours after your largest meal.   For a goal A1c of less than 7%, goal fasting readings are less than 130 and goal 2 hour after meal readings are less than 180.    Please reach out if you start to see higher readings after stopping Ozempic.   Thanks!  [name], PharmD, BCACP, CPP [facility] [phone]

## 2022-10-28 ENCOUNTER — Encounter: Payer: Self-pay | Admitting: Internal Medicine

## 2022-10-28 ENCOUNTER — Other Ambulatory Visit: Payer: Self-pay | Admitting: Internal Medicine

## 2022-11-12 ENCOUNTER — Emergency Department (HOSPITAL_BASED_OUTPATIENT_CLINIC_OR_DEPARTMENT_OTHER)
Admission: EM | Admit: 2022-11-12 | Discharge: 2022-11-12 | Disposition: A | Discharge status: Home / Self Care | Payer: Medicare Other | Attending: Emergency Medicine | Admitting: Emergency Medicine

## 2022-11-12 ENCOUNTER — Emergency Department (HOSPITAL_BASED_OUTPATIENT_CLINIC_OR_DEPARTMENT_OTHER): Payer: Medicare Other

## 2022-11-12 ENCOUNTER — Encounter (HOSPITAL_BASED_OUTPATIENT_CLINIC_OR_DEPARTMENT_OTHER): Payer: Self-pay | Admitting: Urology

## 2022-11-12 DIAGNOSIS — W010XXA Fall on same level from slipping, tripping and stumbling without subsequent striking against object, initial encounter: Secondary | ICD-10-CM | POA: Diagnosis not present

## 2022-11-12 DIAGNOSIS — M25561 Pain in right knee: Secondary | ICD-10-CM | POA: Diagnosis not present

## 2022-11-12 NOTE — ED Notes (Signed)
Reviewed discharge instructions and follow up with pt  and daughter. States understanding. Rolling walker provided Pt able to ambulate with knee immobilizer and walker at discharge

## 2022-11-12 NOTE — Discharge Instructions (Addendum)
You were seen in the ER today for evaluation of your right knee pain after a fall.  It is question that you may have a fracture to a bone called your fibula.  For this, placed you in a knee immobilizer.  I would like for you to follow-up with Dr. Lequita Halt, please call them today to schedule an appointment as soon as possible. Please make sure you are getting around with a walker to help with pain and prevent falls. You can take Tylenol 1000mg  every 6h hours as needed for pain. Please make sure to leave the knee brace on. If you have any concerns, new or worsening symptoms, please return to the nearest ER for re-evaluation.   Contact a doctor if: The knee pain does not stop. The knee pain changes or gets worse. You have a fever along with knee pain. Your knee is red or feels warm when you touch it. Your knee gives out or locks up. Get help right away if: Your knee swells, and the swelling gets worse. You cannot move your knee. You have very bad knee pain that does not get better with pain medicine.

## 2022-11-12 NOTE — ED Provider Notes (Signed)
[place] [facility] [facility] Provider Note   CSN: [id number] Arrival date & time: 11/12/22  1406     History Chief Complaint  Patient presents with   Knee Injury    [name] is a 82 y.o. female.  HPI     Home Medications Prior to Admission medications   Medication Sig Start Date End Date Taking? Authorizing Provider  albuterol (VENTOLIN HFA) 108 (90 Base) MCG/ACT inhaler Inhale 1 puff into the lungs as needed. 07/15/21   [provider]  aspirin EC 81 MG tablet Take 81 mg by mouth daily. Swallow whole.    [provider]  bimatoprost (LUMIGAN) 0.01 % SOLN Place 1 drop into both eyes at bedtime.     [provider]  Blood Glucose Monitoring Suppl (ONETOUCH VERIO FLEX SYSTEM) w/Device KIT USE TO CHECK BLOOD SUGAR 3  TIMES DAILY 11/07/21   [name], MD  Carboxymethylcellulose Sodium (REFRESH LIQUIGEL) 1 % GEL Place 1 drop into both eyes at bedtime.    [provider]  CRESTOR 20 MG tablet TAKE 1 TABLET BY MOUTH AT  BEDTIME 10/29/22   [name], MD  famotidine (PEPCID) 20 MG tablet Take 20 mg by mouth daily with breakfast.    [provider]  furosemide (LASIX) 20 MG tablet TAKE 1 TABLET BY MOUTH IN THE  MORNING 08/22/22   [name], MD  glucose blood [facility] VERIO) test strip USE AS INSTRUCTED TO CHECK  BLOOD SUGAR 3 TIMES DAILY 03/11/22   [name], MD  Lancets [facility] DELICA PLUS [place]) MISC USE ONE LANCET 3 TIMES  DAILY BEFORE MEALS 05/01/21   [name], MD  levothyroxine (SYNTHROID) 50 MCG tablet Take 1 tablet (50 mcg total) by mouth daily. 07/19/22 07/19/23  [name], MD  Lifitegrast Benay Spice) 5 % SOLN Place 1 drop into both eyes daily. xiidra    [provider]  magnesium oxide (MAG-OX) 400 (240 Mg) MG tablet TAKE 1 TABLET BY MOUTH EVERY DAY AFTER SUPPER 08/28/22   [name], MD  Multiple Vitamins-Minerals (MULTIVITAMIN WITH MINERALS) tablet Take 1 tablet by  mouth daily. Centrum Silver    [provider]  nystatin powder Apply 1 Application topically 3 (three) times daily. 10/22/22   [name], MD  olmesartan (BENICAR) 40 MG tablet Take 1 tablet (40 mg total) by mouth daily. 04/17/22 04/17/23  [name], MD  polyethylene glycol [facility] / Ethelene Hal) packet Take 17 g by mouth daily.    [provider]  triamcinolone cream (KENALOG) 0.1 % APPLY TO AFFECTED AREA TWICE DAILY AS NEEDED Patient not taking: Reported on 10/23/2022 05/02/22   [name], MD  vitamin B-6 (PYRIDOXINE) 25 MG tablet Take 25 mg by mouth daily.    [provider]      Allergies    Codeine, Tramadol, and Macrobid [nitrofurantoin macrocrystal]    Review of Systems   Review of Systems  Physical Exam Updated Vital Signs BP (!) 145/65 (BP Location: Right Arm)   Pulse 71   Temp 98.2 F (36.8 C)   Resp 18   Ht 5\' 3"  (1.6 m)   Wt 96.2 kg   SpO2 93%   BMI 37.55 kg/m  Physical Exam  ED Results / Procedures / Treatments   Labs (all labs ordered are listed, but only abnormal results are displayed) Labs Reviewed - No data to display  EKG None  Radiology DG Knee Complete 4 Views Right  Result Date: 11/12/2022 CLINICAL DATA:  Fall.  Knee injury. EXAM: RIGHT KNEE - COMPLETE 4+ VIEW COMPARISON:  None Available. 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 A nondisplaced fracture of the fibular neck is questioned. No other acute fracture or dislocation is identified. There may be a small knee joint effusion. IMPRESSION: Questionable nondisplaced proximal fibula fracture. Electronically Signed   By: Sebastian Ache M.D.   On: 11/12/2022 14:46    Procedures Procedures  {Document cardiac monitor, telemetry assessment procedure when appropriate:1}  Medications Ordered in ED Medications - No data to display  ED Course/ Medical Decision Making/ A&P   {   Click here for ABCD2, HEART and other calculatorsREFRESH Note before  signing :1}                          Medical Decision Making Amount and/or Complexity of Data Reviewed Radiology: ordered.   ***  {Document critical care time when appropriate:1} {Document review of labs and clinical decision tools ie heart score, Chads2Vasc2 etc:1}  {Document your independent review of radiology images, and any outside records:1} {Document your discussion with family members, caretakers, and with consultants:1} {Document social determinants of health affecting pt's care:1} {Document your decision making why or why not admission, treatments were needed:1} Final Clinical Impression(s) / ED Diagnoses Final diagnoses:  None    Rx / DC Orders ED Discharge Orders     None

## 2022-11-12 NOTE — ED Triage Notes (Signed)
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

## 2022-11-20 ENCOUNTER — Other Ambulatory Visit: Payer: Self-pay | Admitting: Internal Medicine

## 2022-11-29 ENCOUNTER — Other Ambulatory Visit: Payer: Medicare Other | Admitting: Pharmacist

## 2022-11-29 MED ORDER — METFORMIN HCL ER 500 MG PO TB24: 500.0000 mg | ORAL_TABLET | Freq: Every day | ORAL | 2 refills | Status: DC

## 2022-11-29 NOTE — Progress Notes (Signed)
11/29/2022 Name: [name] MRN: [id number] DOB: 03-Jul-1941  Chief Complaint  Patient presents with   Medication Management   Diabetes    DORRIAN SHOOK is a 82 y.o. year old female who presented for a telephone visit.   They were referred to the pharmacist by their PCP for assistance in managing diabetes.  Subjective:  Care Team: Primary Care Provider: [name], MD ; Next Scheduled Visit: 02/28/23  Medication Access/Adherence  Current Pharmacy:  [facility]) - [place], [place] - [address] [facility] [address] [place] [address] [place] [address] Phone: [phone] Fax: [phone]  [facility] Delivery - [place], [place] - [address] [place] [address] Phone: [phone] Fax: [phone]  [id number] - [place], [place] - [address] [place] [address] Phone: [phone] Fax: [phone]   Patient reports affordability concerns with their medications: No  Patient reports access/transportation concerns to their pharmacy: No  Patient reports adherence concerns with their medications:  No     Diabetes:  Current medications: none Medications tried in the past: metformin - reported nausea, but unclear whether XR or IR; Jardiance - nausea; Ozempic 0.25 mg - nausea   Current glucose readings: fastings: 160-180s; 130s later in the day  Patient denies hypoglycemic s/sx including dizziness, shakiness, sweating. Patient denies hyperglycemic symptoms including polyuria, polydipsia, polyphagia, nocturia, neuropathy, blurred vision.  Current meal patterns:  - Breakfast: toast; cream cheese - Lunch: salad, tuna; chicken;  - Supper: meat; rice; roll  - Snacks: sometimes crackers, tortilla chips; cheez its;  - Drinks: water   Physical activity: had been doing water aerobics, but fell on her knee 3 weeks ago and has been unable  to do exercise   Hypertension:   Current medications: olmesartan 40 mg daily, furosemide 20 mg daily     Hyperlipidemia/ASCVD Risk Reduction   Current lipid lowering medications: rosuvastatin 40 mg daily   Antiplatelet regimen: aspirin 81 mg daily  Objective:  Lab Results  Component Value Date   HGBA1C 6.7 (H) 10/22/2022    Lab Results  Component Value Date   CREATININE 0.70 10/22/2022   BUN 16 10/22/2022   NA 141 10/22/2022   K 4.0 10/22/2022   CL 104 10/22/2022   CO2 23 10/22/2022    Lab Results  Component Value Date   CHOL 149 10/22/2022   HDL 62 10/22/2022   LDLCALC 73 10/22/2022   TRIG 69 10/22/2022   CHOLHDL 2.4 10/22/2022    Medications Reviewed Today     Reviewed by Alden Hipp, RPH-CPP (Pharmacist) on 11/29/22 at 1343  Med List Status: <None>   Medication Order Taking? Sig Documenting Provider Last Dose Status Informant  albuterol (VENTOLIN HFA) 108 (90 Base) MCG/ACT inhaler [id number] No Inhale 1 puff into the lungs as needed. [provider] Taking Active   aspirin EC 81 MG tablet [id number] No Take 81 mg by mouth daily. Swallow whole. [provider] Taking Active Self  BENICAR 40 MG tablet [id number]  TAKE 1 TABLET BY MOUTH DAILY [name], MD  Active   bimatoprost (LUMIGAN) 0.01 % SOLN [id number] No Place 1 drop into both eyes at bedtime.  [provider] Taking Active Self  Blood Glucose Monitoring Suppl (ONETOUCH VERIO FLEX SYSTEM) w/Device KIT [id number] No USE TO CHECK BLOOD SUGAR 3  TIMES DAILY [name],  MD Taking Active Self  Carboxymethylcellulose Sodium (REFRESH LIQUIGEL) 1 % GEL [id number] No Place 1 drop into both eyes at bedtime. [provider] Taking Active Self  CRESTOR 20 MG tablet [id number]  TAKE 1 TABLET BY MOUTH AT  BEDTIME [name], MD  Active   famotidine (PEPCID) 20 MG tablet [id number] No Take 20 mg by mouth daily with breakfast. [provider] Taking Active Self   furosemide (LASIX) 20 MG tablet [id number] No TAKE 1 TABLET BY MOUTH IN THE  Nelly Laurence, MD Taking Active   glucose blood [facility] VERIO) test strip [id number] No USE AS INSTRUCTED TO CHECK  BLOOD SUGAR 3 TIMES DAILY [name], MD Taking Active Self  Lancets [facility] Larose Kells PLUS [place]) MISC [id number] No USE ONE LANCET 3 TIMES  DAILY BEFORE MEALS [name], MD Taking Active Self  levothyroxine (SYNTHROID) 50 MCG tablet [id number] No Take 1 tablet (50 mcg total) by mouth daily. [name], MD Taking Active   Lifitegrast Benay Spice) 5 % SOLN [id number] No Place 1 drop into both eyes daily. xiidra [provider] Taking Active Self  magnesium oxide (MAG-OX) 400 (240 Mg) MG tablet [id number] No TAKE 1 TABLET BY MOUTH EVERY DAY AFTER SUPPER [name], MD Taking Active   metFORMIN (GLUCOPHAGE-XR) 500 MG 24 hr tablet [id number] Yes Take 1 tablet (500 mg total) by mouth daily. [name], MD  Active   Multiple Vitamins-Minerals (MULTIVITAMIN WITH MINERALS) tablet [id number] No Take 1 tablet by mouth daily. Centrum Silver [provider] Taking Active Self  nystatin powder [id number] No Apply 1 Application topically 3 (three) times daily. [name], MD Taking Active   polyethylene glycol [facility] / [place]) packet [id number] No Take 17 g by mouth daily. [provider] Taking Active Self  triamcinolone cream (KENALOG) 0.1 % [id number] No APPLY TO AFFECTED AREA TWICE DAILY AS NEEDED  Patient not taking: Reported on 10/23/2022   [name], MD Not Taking Active   vitamin B-6 (PYRIDOXINE) 25 MG tablet [id number] No Take 25 mg by mouth daily. [provider] Taking Active Self              Assessment/Plan:   Diabetes: - Currently uncontrolled - Reviewed long term cardiovascular and renal outcomes of uncontrolled blood sugar - Reviewed goal A1c, goal fasting, and goal 2 hour post prandial glucose - Encouraged to restart water  aerobics as able.  - Encouraged to focus on lower portion sizes of carbohydrates.  - Recommend to retry low dose metformin with XR formulation. Patient amenable. Start metformin XR 500 mg daily after supper.  - Recommend to check glucose periodically, fasting and 2 hour post prandial.    Hypertension: - Currently controlled - Recommend to continue current regimen at this time    Hyperlipidemia/ASCVD Risk Reduction: - Currently controlled around goal of <70 - Reviewed long term complications of uncontrolled cholesterol - Recommend to continue current regimen at this time   Follow Up Plan: phone call in 6 weeks   [name], PharmD, BCACP, CPP [facility] [phone]

## 2022-11-29 NOTE — Telephone Encounter (Signed)
RE-Submitted PROVIDER PAGE for OZEMPIC to [employer] [facility] for patient assistance. WAS MISSING PROVIDER PAGE...  Georga Bora [facility] Patient Advocate [phone]

## 2022-11-29 NOTE — Patient Instructions (Signed)
[name],   It was great talking today!  Start metformin XR 500 mg once daily after supper. Metformin can cause nausea or diarrhea for the first week after starting, but it generally resolves after that. Please reach out if you do have stomach upset or troublesome diarrhea that persists for longer than a week.   Check your blood sugars twice daily:  1) Fasting, first thing in the morning before breakfast and  2) 2 hours after your largest meal.   For a goal A1c of less than 7%, goal fasting readings are less than 130 and goal 2 hour after meal readings are less than 180.    Take care!  [name], PharmD, BCACP, CPP [facility] [phone]

## 2022-12-16 ENCOUNTER — Other Ambulatory Visit: Payer: Self-pay | Admitting: Internal Medicine

## 2023-01-14 ENCOUNTER — Other Ambulatory Visit: Payer: Medicare Other | Admitting: Pharmacist

## 2023-01-14 MED ORDER — METFORMIN HCL ER 500 MG PO TB24: 500.0000 mg | ORAL_TABLET | Freq: Two times a day (BID) | ORAL | 1 refills | Status: DC

## 2023-01-14 NOTE — Patient Instructions (Signed)
Charlette,   It was great talking to you today!  Increase metformin to 1 tablet twice daily. Take this after a meal.  Check your blood sugars twice daily:  1) Fasting, first thing in the morning before breakfast and  2) 2 hours after your largest meal.   For a goal A1c of less than 7%, goal fasting readings are less than 130 and goal 2 hour after meal readings are less than 180.    Please reach out with any questions.   Thanks!  [name], PharmD, BCACP, CPP Clinical Pharmacist [facility] [phone]

## 2023-01-14 NOTE — Progress Notes (Signed)
01/14/2023 Name: [name] MRN: [id number] DOB: 06/18/1941  Chief Complaint  Patient presents with   Medication Management   Diabetes    [name] is a 83 y.o. year old female who presented for a telephone visit.   They were referred to the pharmacist by their PCP for assistance in managing diabetes.    Subjective:  Care Team: Primary Care Provider: [name], MD ; Next Scheduled Visit: 02/28/23  Medication Access/Adherence  Current Pharmacy:  [facility]) - [place], [place] - [address] [facility] [address] [place] [address] [place] [address] Phone: [phone] Fax: [phone]  [facility] Delivery - [place], [place] - [address] [place]  [address] Phone: [phone] Fax: [phone]  [id number] - [place], [place] - [address] [place] [address] Phone: [phone] Fax: [phone]   Patient reports affordability concerns with their medications: No  Patient reports access/transportation concerns to their pharmacy: No  Patient reports adherence concerns with their medications:  No     Diabetes:  Current medications: metformin XR 500 mg daily  Medications tried in the past: Jardiance - nausea; Ozempic 0.25 mg - nausea   Current glucose readings: reports variation in sugars with an old glucometer; plans to start using a new one; reports some fastings up to 150-170s; reports post prandial readings 110s-130s usually   Patient denies hypoglycemic s/sx including dizziness, shakiness, sweating. Patient denies hyperglycemic symptoms including polyuria, polydipsia, polyphagia, nocturia, neuropathy, blurred vision.  Hypertension:   Current medications: olmesartan 40 mg daily, furosemide 20 mg daily     Hyperlipidemia/ASCVD Risk Reduction   Current lipid lowering medications: rosuvastatin 40 mg daily    Antiplatelet regimen: aspirin 81 mg daily   Objective:  Lab Results  Component Value Date   HGBA1C 6.7 (H) 10/22/2022    Lab Results  Component Value Date   CREATININE 0.70 10/22/2022   BUN 16 10/22/2022   NA 141 10/22/2022   K 4.0 10/22/2022   CL 104 10/22/2022   CO2 23 10/22/2022    Lab Results  Component Value Date   CHOL 149 10/22/2022   HDL 62 10/22/2022   LDLCALC 73 10/22/2022   TRIG 69 10/22/2022   CHOLHDL 2.4 10/22/2022    Medications Reviewed Today     Reviewed by [name], RPH-CPP (Pharmacist) on 11/29/22 at 1343  Med List Status: <None>   Medication Order Taking? Sig Documenting Provider Last Dose Status Informant  albuterol (VENTOLIN HFA) 108 (90 Base) MCG/ACT inhaler [id number] No Inhale 1 puff into the lungs as needed. [provider] Taking Active   aspirin EC 81 MG tablet [id number] No Take 81 mg by mouth daily. Swallow whole. [provider] Taking Active Self  BENICAR 40 MG tablet [id number]  TAKE 1 TABLET BY MOUTH DAILY Stephanie Peng, MD  Active   bimatoprost (LUMIGAN) 0.01 % SOLN [id number] No Place 1 drop into both eyes at bedtime.  [provider] Taking Active Self  Blood Glucose Monitoring Suppl (ONETOUCH VERIO FLEX SYSTEM) w/Device KIT [id number] No USE TO CHECK BLOOD SUGAR 3  TIMES DAILY Stephanie Peng, MD Taking Active Self  Carboxymethylcellulose Sodium (REFRESH LIQUIGEL) 1 % GEL [id number] No Place 1 drop into both eyes at bedtime. [provider] Taking Active Self  CRESTOR 20 MG tablet [id number]  TAKE 1 TABLET BY MOUTH  AT  BEDTIME Stephanie Peng, MD  Active   famotidine (PEPCID) 20 MG tablet [id number] No Take 20 mg by mouth daily with breakfast. [provider] Taking Active Self  furosemide (LASIX) 20 MG tablet [id number] No TAKE 1 TABLET BY MOUTH IN THE  Nelly Laurence, MD Taking Active   glucose blood [facility] VERIO) test strip [id number] No USE AS INSTRUCTED TO CHECK  BLOOD SUGAR  3 TIMES DAILY Stephanie Peng, MD Taking Active Self  Lancets [facility] Larose Kells PLUS [place]) MISC [id number] No USE ONE LANCET 3 TIMES  DAILY BEFORE MEALS Stephanie Peng, MD Taking Active Self  levothyroxine (SYNTHROID) 50 MCG tablet [id number] No Take 1 tablet (50 mcg total) by mouth daily. Stephanie Peng, MD Taking Active   Lifitegrast Benay Spice) 5 % SOLN [id number] No Place 1 drop into both eyes daily. xiidra [provider] Taking Active Self  magnesium oxide (MAG-OX) 400 (240 Mg) MG tablet [id number] No TAKE 1 TABLET BY MOUTH EVERY DAY AFTER SUPPER Stephanie Peng, MD Taking Active   metFORMIN (GLUCOPHAGE-XR) 500 MG 24 hr tablet [id number] Yes Take 1 tablet (500 mg total) by mouth daily. Stephanie Peng, MD  Active   Multiple Vitamins-Minerals (MULTIVITAMIN WITH MINERALS) tablet [id number] No Take 1 tablet by mouth daily. Centrum Silver [provider] Taking Active Self  nystatin powder [id number] No Apply 1 Application topically 3 (three) times daily. Stephanie Peng, MD Taking Active   polyethylene glycol [facility] [place]) packet [id number] No Take 17 g by mouth daily. [provider] Taking Active Self  triamcinolone cream (KENALOG) 0.1 % [id number] No APPLY TO AFFECTED AREA TWICE DAILY AS NEEDED  Patient not taking: Reported on 10/23/2022   Stephanie Peng, MD Not Taking Active   vitamin B-6 (PYRIDOXINE) 25 MG tablet [id number] No Take 25 mg by mouth daily. [provider] Taking Active Self              Assessment/Plan:   Diabetes: - Currently uncontrolled per home readings - Reviewed goal A1c, goal fasting, and goal 2 hour post prandial glucose - Recommend to increase metformin to twice daily. Patient is in agreement.  - Recommend to check glucose twice daily, fasting and 2 hour post prandial.   Hypertension: - Currently controlled - Recommend to continue current regimen   Hyperlipidemia/ASCVD Risk Reduction: - Currently controlled.  - Recommend  to continue current regimen    Follow Up Plan: goals of pharmacy care likely met; will review labs in August when available and outreach patient at that time if needed  [name], PharmD, [place], CPP Clinical Pharmacist [facility] [phone]

## 2023-01-15 ENCOUNTER — Other Ambulatory Visit: Payer: Medicare Other | Admitting: Pharmacist

## 2023-02-06 LAB — HM MAMMOGRAPHY

## 2023-02-08 ENCOUNTER — Encounter: Payer: Self-pay | Admitting: Internal Medicine

## 2023-02-27 NOTE — Progress Notes (Signed)
I,[name], CMA,acting as a Neurosurgeon for [name], MD.,have documented all relevant documentation on the behalf of [name], MD,as directed by  [name], MD while in the presence of [name], MD.  Subjective:  Patient ID: [name] , female    DOB: [birth year] , 82 y.o.   MRN: [id number]  Chief Complaint  Patient presents with   Diabetes   Hypertension    HPI  Patient presents today for a diabetes & bp check. She reports compliance with medications. Denies headache, chest pain, and SOB. She states she is going to [facility] for exercise.        Diabetes She presents for her follow-up diabetic visit. She has type 2 diabetes mellitus. Her disease course has been stable. There are no hypoglycemic associated symptoms. There are no diabetic associated symptoms. Pertinent negatives for diabetes include no blurred vision, no polydipsia, no polyphagia and no polyuria. There are no hypoglycemic complications. Risk factors for coronary artery disease include diabetes mellitus, dyslipidemia, hypertension, obesity, post-menopausal and sedentary lifestyle. She participates in exercise intermittently. Eye exam is current.  Hypertension This is a chronic problem. The current episode started more than 1 year ago. The problem has been gradually improving since onset. The problem is controlled. Pertinent negatives include no blurred vision. Agents associated with hypertension include thyroid hormones. Risk factors for coronary artery disease include dyslipidemia, obesity, post-menopausal state and sedentary lifestyle. Past treatments include angiotensin blockers. The current treatment provides moderate improvement. Compliance problems include exercise.      Past Medical History:  Diagnosis Date   Arthritis    RIGHT KNEE PAIN AND OA   Chest pain 05/03/2015   Diabetes mellitus without complication (HCC)    Elevated cholesterol    GERD (gastroesophageal reflux disease)     History of kidney stones    Hypertension    Hypothyroidism    IBS (irritable bowel syndrome)    Localized swelling of both lower legs    Rotator cuff disorder    BOTH SHOULDERS - NO SURGERY - AND STATES LIMITATIONS IN ARM MOVEMENT   Shortness of breath 05/03/2015   Swelling    CHRONIC SWELLING RIGHT HAND AND BOTH FEET AND BOTH LEGS   Vertigo      Family History  Problem Relation Age of Onset   Diabetes Mother    Dementia Mother    Diabetes Father    Diabetes Sister    Breast cancer Sister    Kidney cancer Sister    Lung cancer Sister    Lung cancer Maternal Aunt    Pancreatic cancer Maternal Aunt    Cancer Other      Current Outpatient Medications:    aspirin EC 81 MG tablet, Take 81 mg by mouth daily. Swallow whole., Disp: , Rfl:    BENICAR 40 MG tablet, TAKE 1 TABLET BY MOUTH DAILY, Disp: 90 tablet, Rfl: 3   bimatoprost (LUMIGAN) 0.01 % SOLN, Place 1 drop into both eyes at bedtime. , Disp: , Rfl:    Blood Glucose Monitoring Suppl (ONETOUCH VERIO FLEX SYSTEM) w/Device KIT, USE TO CHECK BLOOD SUGAR 3  TIMES DAILY, Disp: 1 kit, Rfl: 1   Carboxymethylcellulose Sodium (REFRESH LIQUIGEL) 1 % GEL, Place 1 drop into both eyes at bedtime., Disp: , Rfl:    CRESTOR 20 MG tablet, TAKE 1 TABLET BY MOUTH AT  BEDTIME, Disp: 90 tablet, Rfl: 3   famotidine (PEPCID) 20 MG tablet, Take 20 mg by mouth daily  with breakfast., Disp: , Rfl:    furosemide (LASIX) 20 MG tablet, TAKE 1 TABLET BY MOUTH IN THE  MORNING, Disp: 90 tablet, Rfl: 3   glucose blood (ONETOUCH VERIO) test strip, USE AS INSTRUCTED TO CHECK  BLOOD SUGAR 3 TIMES DAILY, Disp: 300 strip, Rfl: 3   Lancets (ONETOUCH DELICA PLUS LANCET33G) MISC, USE ONE LANCET 3 TIMES  DAILY BEFORE MEALS, Disp: 300 each, Rfl: 3   Lifitegrast (XIIDRA) 5 % SOLN, Place 1 drop into both eyes daily. xiidra, Disp: , Rfl:    magnesium oxide (MAG-OX) 400 (240 Mg) MG tablet, TAKE 1 TABLET BY MOUTH EVERY DAY AFTER SUPPER, Disp: 90 tablet, Rfl: 1   metFORMIN  (GLUCOPHAGE-XR) 500 MG 24 hr tablet, Take 1 tablet (500 mg total) by mouth 2 (two) times daily with a meal., Disp: 180 tablet, Rfl: 1   Multiple Vitamins-Minerals (MULTIVITAMIN WITH MINERALS) tablet, Take 1 tablet by mouth daily. Centrum Silver, Disp: , Rfl:    polyethylene glycol (MIRALAX / GLYCOLAX) packet, Take 17 g by mouth daily., Disp: , Rfl:    SYNTHROID 50 MCG tablet, TAKE 1 TABLET BY MOUTH DAILY, Disp: 90 tablet, Rfl: 3   traZODone (DESYREL) 50 MG tablet, Take 1 tablet (50 mg total) by mouth at bedtime., Disp: 30 tablet, Rfl: 1   vitamin B-6 (PYRIDOXINE) 25 MG tablet, Take 25 mg by mouth daily., Disp: , Rfl:    albuterol (VENTOLIN HFA) 108 (90 Base) MCG/ACT inhaler, Inhale 1 puff into the lungs as needed. (Patient not taking: Reported on 02/28/2023), Disp: , Rfl:    nystatin powder, Apply 1 Application topically 3 (three) times daily. (Patient not taking: Reported on 02/28/2023), Disp: 45 g, Rfl: 0   triamcinolone cream (KENALOG) 0.1 %, APPLY TO AFFECTED AREA TWICE DAILY AS NEEDED (Patient not taking: Reported on 10/23/2022), Disp: 45 g, Rfl: 0   Allergies  Allergen Reactions   Codeine Nausea And Vomiting   Tramadol Nausea And Vomiting   Macrobid [Nitrofurantoin Macrocrystal] Rash     Review of Systems  Constitutional: Negative.   Eyes: Negative.  Negative for blurred vision.  Respiratory: Negative.    Cardiovascular: Negative.   Gastrointestinal: Negative.   Endocrine: Negative for polydipsia, polyphagia and polyuria.  Neurological: Negative.   Psychiatric/Behavioral:  Positive for sleep disturbance.      Today's Vitals   02/28/23 0950 02/28/23 1016  BP: (!) 150/90 136/74  Pulse: 80   Temp: (!) 97.5 F (36.4 C)   SpO2: 98%   Weight: 212 lb 6.4 oz (96.3 kg)   Height: 5\' 3"  (1.6 m)    Body mass index is 37.62 kg/m.  Wt Readings from Last 3 Encounters:  02/28/23 212 lb 6.4 oz (96.3 kg)  11/12/22 212 lb (96.2 kg)  10/22/22 212 lb (96.2 kg)     Objective:  Physical  Exam Vitals and nursing note reviewed.  Constitutional:      Appearance: Normal appearance. She is obese.  HENT:     Head: Normocephalic and atraumatic.  Eyes:     Extraocular Movements: Extraocular movements intact.  Cardiovascular:     Rate and Rhythm: Normal rate and regular rhythm.     Heart sounds: Normal heart sounds.  Pulmonary:     Effort: Pulmonary effort is normal.     Breath sounds: Normal breath sounds.  Musculoskeletal:     Cervical back: Normal range of motion.  Skin:    General: Skin is warm.  Neurological:     General: No focal deficit  present.     Mental Status: She is alert.  Psychiatric:        Mood and Affect: Mood normal.        Behavior: Behavior normal.         Assessment And Plan:  Dyslipidemia associated with type 2 diabetes mellitus (HCC) Assessment & Plan: Chronic, LDL goal is less than 70 due to DM and Ao atherosclerosis. She will c/w Crestor 20mg  daily. She is currently taking metformin XR 500mg  twice daily. Unfortunately, she did not tolerate Ozempic.   Orders: -     CMP14+EGFR -     Hemoglobin A1c  Hypertensive heart disease without heart failure Assessment & Plan: Chronic, fair control. She is encouraged to follow low sodium diet. She will c/w olmesartan 40mg  daily and furosemide 20mg  daily. If elevated at next visit, I will consider adding amlodipine 2.5mg  nightly.    Aortic atherosclerosis (HCC) Assessment & Plan: Chronic, LDL goal <70. She will c/w ASA 81mg  and rosuvastatin 20mg  daily.   Primary insomnia Assessment & Plan: Chronic, I will start her on trazodone 50mg  nightly. She is encouraged to develop a bedtime routine.    Primary hypothyroidism -     TSH + free T4  Class 2 severe obesity due to excess calories with serious comorbidity and body mass index (BMI) of 37.0 to 37.9 in adult [facility]) Assessment & Plan: She is encouraged to strive for BMI less than 30 to decrease cardiac risk. Advised to aim for at least 150 minutes  of exercise per week.    Drug therapy -     Vitamin B12  Other orders -     traZODone HCl; Take 1 tablet (50 mg total) by mouth at bedtime.  Dispense: 30 tablet; Refill: 1  She is encouraged to strive for BMI less than 30 to decrease cardiac risk. Advised to aim for at least 150 minutes of exercise per week.    Return in 4 weeks (on 03/28/2023), or VIRTUAL VISIT, F/U TRAZODONE.  Patient was given opportunity to ask questions. Patient verbalized understanding of the plan and was able to repeat key elements of the plan. All questions were answered to their satisfaction.    I, [name], MD, have reviewed all documentation for this visit. The documentation on 02/28/23 for the exam, diagnosis, procedures, and orders are all accurate and complete.   IF YOU HAVE BEEN REFERRED TO A SPECIALIST, IT MAY TAKE 1-2 WEEKS TO SCHEDULE/PROCESS THE REFERRAL. IF YOU HAVE NOT HEARD FROM US/SPECIALIST IN TWO WEEKS, PLEASE GIVE [place] A CALL AT [phone].   THE PATIENT IS ENCOURAGED TO PRACTICE SOCIAL DISTANCING DUE TO THE COVID-19 PANDEMIC.

## 2023-02-27 NOTE — Patient Instructions (Signed)

## 2023-02-28 ENCOUNTER — Ambulatory Visit (INDEPENDENT_AMBULATORY_CARE_PROVIDER_SITE_OTHER): Payer: Medicare Other | Admitting: Internal Medicine

## 2023-02-28 ENCOUNTER — Encounter: Payer: Self-pay | Admitting: Internal Medicine

## 2023-02-28 VITALS — BP 136/74 | HR 80 | Temp 97.5°F | Ht 63.0 in | Wt 212.4 lb

## 2023-02-28 DIAGNOSIS — I700 Atherosclerosis of aorta: Secondary | ICD-10-CM | POA: Diagnosis not present

## 2023-02-28 DIAGNOSIS — F5101 Primary insomnia: Secondary | ICD-10-CM

## 2023-02-28 DIAGNOSIS — E785 Hyperlipidemia, unspecified: Secondary | ICD-10-CM

## 2023-02-28 DIAGNOSIS — E039 Hypothyroidism, unspecified: Secondary | ICD-10-CM

## 2023-02-28 DIAGNOSIS — E1169 Type 2 diabetes mellitus with other specified complication: Secondary | ICD-10-CM | POA: Diagnosis not present

## 2023-02-28 DIAGNOSIS — Z79899 Other long term (current) drug therapy: Secondary | ICD-10-CM

## 2023-02-28 DIAGNOSIS — Z6837 Body mass index (BMI) 37.0-37.9, adult: Secondary | ICD-10-CM

## 2023-02-28 DIAGNOSIS — B353 Tinea pedis: Secondary | ICD-10-CM

## 2023-02-28 DIAGNOSIS — I119 Hypertensive heart disease without heart failure: Secondary | ICD-10-CM

## 2023-02-28 DIAGNOSIS — I10 Essential (primary) hypertension: Secondary | ICD-10-CM

## 2023-02-28 MED ORDER — TRAZODONE HCL 50 MG PO TABS: 50.0000 mg | ORAL_TABLET | Freq: Every day | ORAL | 1 refills | Status: DC

## 2023-03-01 LAB — HEMOGLOBIN A1C
Est. average glucose Bld gHb Est-mCnc: 157 mg/dL
Hgb A1c MFr Bld: 7.1 % — ABNORMAL HIGH (ref 4.8–5.6)

## 2023-03-01 LAB — CMP14+EGFR
ALT: 26 IU/L (ref 0–32)
AST: 25 IU/L (ref 0–40)
Albumin: 4 g/dL (ref 3.7–4.7)
Alkaline Phosphatase: 92 IU/L (ref 44–121)
BUN/Creatinine Ratio: 24 (ref 12–28)
BUN: 15 mg/dL (ref 8–27)
Bilirubin Total: 0.6 mg/dL (ref 0.0–1.2)
CO2: 23 mmol/L (ref 20–29)
Calcium: 9.3 mg/dL (ref 8.7–10.3)
Chloride: 105 mmol/L (ref 96–106)
Creatinine, Ser: 0.63 mg/dL (ref 0.57–1.00)
Globulin, Total: 2.1 g/dL (ref 1.5–4.5)
Glucose: 106 mg/dL — ABNORMAL HIGH (ref 70–99)
Potassium: 4.1 mmol/L (ref 3.5–5.2)
Sodium: 142 mmol/L (ref 134–144)
Total Protein: 6.1 g/dL (ref 6.0–8.5)
eGFR: 88 mL/min/{1.73_m2} (ref >59)

## 2023-03-01 LAB — TSH+FREE T4
Free T4: 1.58 ng/dL (ref 0.82–1.77)
TSH: 0.932 u[IU]/mL (ref 0.450–4.500)

## 2023-03-01 LAB — VITAMIN B12: Vitamin B-12: 623 pg/mL (ref 232–1245)

## 2023-03-11 DIAGNOSIS — F5101 Primary insomnia: Secondary | ICD-10-CM | POA: Insufficient documentation

## 2023-03-11 NOTE — Assessment & Plan Note (Signed)
Chronic, I will start her on trazodone 50mg  nightly. She is encouraged to develop a bedtime routine.

## 2023-03-11 NOTE — Assessment & Plan Note (Signed)
She is encouraged to strive for BMI less than 30 to decrease cardiac risk. Advised to aim for at least 150 minutes of exercise per week.  

## 2023-03-11 NOTE — Assessment & Plan Note (Signed)
Chronic, LDL goal is less than 70 due to DM and Ao atherosclerosis. She will c/w Crestor 20mg  daily. She is currently taking metformin XR 500mg  twice daily. Unfortunately, she did not tolerate Ozempic.

## 2023-03-11 NOTE — Assessment & Plan Note (Signed)
Chronic, LDL goal <70. She will c/w ASA 81mg  and rosuvastatin 20mg  daily.

## 2023-03-11 NOTE — Assessment & Plan Note (Signed)
Chronic, fair control. She is encouraged to follow low sodium diet. She will c/w olmesartan 40mg  daily and furosemide 20mg  daily. If elevated at next visit, I will consider adding amlodipine 2.5mg  nightly.

## 2023-03-18 ENCOUNTER — Encounter (HOSPITAL_BASED_OUTPATIENT_CLINIC_OR_DEPARTMENT_OTHER): Payer: Self-pay | Admitting: Urology

## 2023-03-18 ENCOUNTER — Emergency Department (HOSPITAL_BASED_OUTPATIENT_CLINIC_OR_DEPARTMENT_OTHER): Payer: Medicare Other

## 2023-03-18 ENCOUNTER — Emergency Department (HOSPITAL_BASED_OUTPATIENT_CLINIC_OR_DEPARTMENT_OTHER)
Admission: EM | Admit: 2023-03-18 | Discharge: 2023-03-18 | Disposition: A | Discharge status: Home / Self Care | Payer: Medicare Other

## 2023-03-18 DIAGNOSIS — I10 Essential (primary) hypertension: Secondary | ICD-10-CM | POA: Diagnosis not present

## 2023-03-18 DIAGNOSIS — Z1152 Encounter for screening for COVID-19: Secondary | ICD-10-CM | POA: Insufficient documentation

## 2023-03-18 DIAGNOSIS — E039 Hypothyroidism, unspecified: Secondary | ICD-10-CM | POA: Diagnosis not present

## 2023-03-18 DIAGNOSIS — E119 Type 2 diabetes mellitus without complications: Secondary | ICD-10-CM | POA: Diagnosis not present

## 2023-03-18 DIAGNOSIS — R0602 Shortness of breath: Secondary | ICD-10-CM | POA: Diagnosis not present

## 2023-03-18 DIAGNOSIS — Z7982 Long term (current) use of aspirin: Secondary | ICD-10-CM | POA: Diagnosis not present

## 2023-03-18 DIAGNOSIS — Z7984 Long term (current) use of oral hypoglycemic drugs: Secondary | ICD-10-CM | POA: Diagnosis not present

## 2023-03-18 DIAGNOSIS — R079 Chest pain, unspecified: Secondary | ICD-10-CM | POA: Insufficient documentation

## 2023-03-18 DIAGNOSIS — Z79899 Other long term (current) drug therapy: Secondary | ICD-10-CM | POA: Diagnosis not present

## 2023-03-18 LAB — CBC
HCT: 40.9 % (ref 36.0–46.0)
Hemoglobin: 13.9 g/dL (ref 12.0–15.0)
MCH: 30.5 pg (ref 26.0–34.0)
MCHC: 34 g/dL (ref 30.0–36.0)
MCV: 89.9 fL (ref 80.0–100.0)
Platelets: 144 10*3/uL — ABNORMAL LOW (ref 150–400)
RBC: 4.55 MIL/uL (ref 3.87–5.11)
RDW: 14 % (ref 11.5–15.5)
WBC: 3.9 10*3/uL — ABNORMAL LOW (ref 4.0–10.5)
nRBC: 0 % (ref 0.0–0.2)

## 2023-03-18 LAB — BRAIN NATRIURETIC PEPTIDE: B Natriuretic Peptide: 63.7 pg/mL (ref 0.0–100.0)

## 2023-03-18 LAB — TROPONIN I (HIGH SENSITIVITY)
Troponin I (High Sensitivity): 14 ng/L (ref <18)
Troponin I (High Sensitivity): 16 ng/L (ref <18)

## 2023-03-18 LAB — BASIC METABOLIC PANEL
Anion gap: 8 (ref 5–15)
BUN: 13 mg/dL (ref 8–23)
CO2: 23 mmol/L (ref 22–32)
Calcium: 8.9 mg/dL (ref 8.9–10.3)
Chloride: 107 mmol/L (ref 98–111)
Creatinine, Ser: 0.64 mg/dL (ref 0.44–1.00)
GFR, Estimated: >60 mL/min (ref >60)
Glucose, Bld: 128 mg/dL — ABNORMAL HIGH (ref 70–99)
Potassium: 3.6 mmol/L (ref 3.5–5.1)
Sodium: 138 mmol/L (ref 135–145)

## 2023-03-18 LAB — D-DIMER, QUANTITATIVE: D-Dimer, Quant: 0.46 ug{FEU}/mL (ref 0.00–0.50)

## 2023-03-18 LAB — SARS CORONAVIRUS 2 BY RT PCR: SARS Coronavirus 2 by RT PCR: NEGATIVE

## 2023-03-18 MED ORDER — ACETAMINOPHEN 325 MG PO TABS
650.0000 mg | ORAL_TABLET | Freq: Once | ORAL | Status: AC
  Administered 2023-03-18: 650 mg via ORAL
  Filled 2023-03-18: qty 2

## 2023-03-18 NOTE — Discharge Instructions (Signed)
Your workup today was read during.  Please follow-up with the cardiologist.  I have made a referral so you should receive a call in the next day or 2 regarding this.  Take Tylenol as needed for pain.  Return to the emergency department for worsening symptoms.

## 2023-03-18 NOTE — ED Provider Notes (Signed)
[place] [facility]ERG[facility] DEPARTMENT AT [facility] Provider Note   CSN: [id number] Arrival date & time: 03/18/23  1044     History  Chief Complaint  Patient presents with   Chest Pain    [name] is a 82 y.o. female.  82 year old female with past medical history of diabetes, hypertension, and hypothyroidism presenting to the emergency department today with right-sided chest pain.  The patient states has been going now for the past few days.  She states that it is a sharp pain.  She states that this will "catch" and hurt when she takes deep breath and occasionally.  She denies any associated hemoptysis.  She reports a mild cough that is nonproductive.  She states that she is having some dyspnea on exertion but denies any orthopnea.  She states the symptoms have been going now for the past week or so.  Patient is currently on Lasix and has been taking this as prescribed.  She think she is on 20 mg/day.   Chest Pain      Home Medications Prior to Admission medications   Medication Sig Start Date End Date Taking? Authorizing Provider  albuterol (VENTOLIN HFA) 108 (90 Base) MCG/ACT inhaler Inhale 1 puff into the lungs as needed. Patient not taking: Reported on 02/28/2023 07/15/21   [provider]  aspirin EC 81 MG tablet Take 81 mg by mouth daily. Swallow whole.    [provider]  BENICAR 40 MG tablet TAKE 1 TABLET BY MOUTH DAILY 11/20/22   [name], MD  bimatoprost (LUMIGAN) 0.01 % SOLN Place 1 drop into both eyes at bedtime.     [provider]  Blood Glucose Monitoring Suppl (ONETOUCH VERIO FLEX SYSTEM) w/Device KIT USE TO CHECK BLOOD SUGAR 3  TIMES DAILY 11/07/21   [name], MD  Carboxymethylcellulose Sodium (REFRESH LIQUIGEL) 1 % GEL Place 1 drop into both eyes at bedtime.    [provider]  CRESTOR 20 MG tablet TAKE 1 TABLET BY MOUTH AT  BEDTIME 10/29/22   [name], MD  famotidine (PEPCID) 20 MG tablet Take 20 mg by  mouth daily with breakfast.    [provider]  furosemide (LASIX) 20 MG tablet TAKE 1 TABLET BY MOUTH IN THE  MORNING 08/22/22   [name], MD  glucose blood [facility] VERIO) test strip USE AS INSTRUCTED TO CHECK  BLOOD SUGAR 3 TIMES DAILY 03/11/22   [name], MD  Lancets [facility] DELICA PLUS [place]) MISC USE ONE LANCET 3 TIMES  DAILY BEFORE MEALS 05/01/21   [name], MD  Lifitegrast [place] [facility]) 5 % SOLN Place 1 drop into both eyes daily. xiidra    [provider]  magnesium oxide (MAG-OX) 400 (240 Mg) MG tablet TAKE 1 TABLET BY MOUTH EVERY DAY AFTER SUPPER 08/28/22   [name], MD  metFORMIN (GLUCOPHAGE-XR) 500 MG 24 hr tablet Take 1 tablet (500 mg total) by mouth 2 (two) times daily with a meal. 01/14/23   [name], MD  Multiple Vitamins-Minerals (MULTIVITAMIN WITH MINERALS) tablet Take 1 tablet by mouth daily. Centrum Silver    [provider]  nystatin powder Apply 1 Application topically 3 (three) times daily. Patient not taking: Reported on 02/28/2023 10/22/22   [name], MD  polyethylene glycol [facility] / Ethelene Hal) packet Take 17 g by mouth daily.    [provider]  SYNTHROID 50 MCG tablet TAKE 1 TABLET BY MOUTH DAILY 12/18/22   [name], MD  traZODone (DESYREL) 50 MG  tablet Take 1 tablet (50 mg total) by mouth at bedtime. 02/28/23   [name], MD  triamcinolone cream (KENALOG) 0.1 % APPLY TO AFFECTED AREA TWICE DAILY AS NEEDED Patient not taking: Reported on 10/23/2022 05/02/22   [name], MD  vitamin B-6 (PYRIDOXINE) 25 MG tablet Take 25 mg by mouth daily.    [provider]      Allergies    Codeine, Tramadol, and Macrobid [nitrofurantoin macrocrystal]    Review of Systems   Review of Systems  Cardiovascular:  Positive for chest pain.  All other systems reviewed and are negative.   Physical Exam Updated Vital Signs BP 137/75 (BP Location: Left Arm)   Pulse 69   Temp (!) 97.4 F (36.3 C)    Resp 18   Ht 5\' 3"  (1.6 m)   Wt 96.2 kg   SpO2 98%   BMI 37.55 kg/m  Physical Exam Vitals and nursing note reviewed.   Gen: NAD Eyes: PERRL, EOMI HEENT: no oropharyngeal swelling Neck: trachea midline Resp: clear to auscultation bilaterally, diminished at bilateral lung bases Card: RRR, no murmurs, rubs, or gallops Abd: nontender, nondistended Extremities: no calf tenderness, no edema Vascular: 2+ radial pulses bilaterally, 2+ DP pulses bilaterally Skin: no rashes Psyc: acting appropriately   ED Results / Procedures / Treatments   Labs (all labs ordered are listed, but only abnormal results are displayed) Labs Reviewed  BASIC METABOLIC PANEL - Abnormal; Notable for the following components:      Result Value   Glucose, Bld 128 (*)    All other components within normal limits  CBC - Abnormal; Notable for the following components:   WBC 3.9 (*)    Platelets 144 (*)    All other components within normal limits  SARS CORONAVIRUS 2 BY RT PCR  D-DIMER, QUANTITATIVE  BRAIN NATRIURETIC PEPTIDE  TROPONIN I (HIGH SENSITIVITY)  TROPONIN I (HIGH SENSITIVITY)    EKG EKG Interpretation Date/Time:  Monday March 18 2023 10:54:15 EDT Ventricular Rate:  73 PR Interval:  160 QRS Duration:  122 QT Interval:  423 QTC Calculation: 467 R Axis:   -66  Text Interpretation: Sinus rhythm RBBB and LAFB Left ventricular hypertrophy Appears similar in morphology to previous EKGs Confirmed by Beckey Downing [phone]) on 03/18/2023 11:42:41 AM  Radiology DG Chest 2 View  Result Date: 03/18/2023 CLINICAL DATA:  Chest pain shortness of breath EXAM: CHEST - 2 VIEW COMPARISON:  05/30/2021 FINDINGS: Cardiac and mediastinal contours are within normal limits. No focal pulmonary opacity. No pleural effusion or pneumothorax. No acute osseous abnormality. IMPRESSION: No acute cardiopulmonary process. Electronically Signed   By: Wiliam Ke M.D.   On: 03/18/2023 11:47    Procedures Procedures     Medications Ordered in ED Medications  acetaminophen (TYLENOL) tablet 650 mg (has no administration in time range)    ED Course/ Medical Decision Making/ A&P                                 Medical Decision Making 82 year old female with past medical history of diabetes, hypertension, and hypothyroidism presents emergency department today with dyspnea on exertion and chest right-sided chest discomfort over the past week.  I will further evaluate patient here with basic labs Wels and EKG, chest x-ray, and troponin for further evaluation for ACS, pulmonary edema, pulmonary infiltrates, pneumothorax.  If this is unrevealing I will consider further workup for potential pulmonary embolism as patient  does report a "catching" sensation when she does breathe deeply.  Also add on a BNP to evaluate for CHF.  I will reevaluate for ultimate disposition.  The patient's EKG interpreted by me shows a sinus rhythm with a rate of 73 with left axis deviation, right bundle branch block, left anterior fascicular block, and nonspecific ST-T changes.  I do not appreciate any significant ST elevations and this does appear similar in morphology to her previous EKG.  Her chest x-ray is unremarkable.  A D-dimer is added on.  The patient's workup including her BNP, D-dimer, and troponins were unremarkable.  Her chest x-ray is clear.  She remains stable here.  I think that she is stable for discharge.  Is possible this is due to musculoskeletal pain as it is on the right side.  Suspicion for aortic dissection is low at this time.  I have made a referral to cardiology for further evaluation for very atypical cardiac chest pain although suspicion for this is very low at this time but I do think this is reasonable as she does have some risk factors.  She is discharged with return precautions.  Amount and/or Complexity of Data Reviewed Labs: ordered. Radiology: ordered.  Risk OTC drugs.           Final  Clinical Impression(s) / ED Diagnoses Final diagnoses:  Nonspecific chest pain  Dispo: discharge  Rx / DC Orders ED Discharge Orders          Ordered    Ambulatory referral to Cardiology       Comments: If you have not heard from the Cardiology office within the next 72 hours please call [phone].   03/18/23 1339              Durwin Glaze, MD 03/18/23 1340

## 2023-03-18 NOTE — ED Triage Notes (Signed)
Pt states SOB and chest pain that started 10 days ago  Denies cough or fever

## 2023-03-28 ENCOUNTER — Telehealth: Payer: Medicare Other | Admitting: Internal Medicine

## 2023-03-28 DIAGNOSIS — K219 Gastro-esophageal reflux disease without esophagitis: Secondary | ICD-10-CM | POA: Diagnosis not present

## 2023-03-28 DIAGNOSIS — F5101 Primary insomnia: Secondary | ICD-10-CM | POA: Diagnosis not present

## 2023-03-28 DIAGNOSIS — R0789 Other chest pain: Secondary | ICD-10-CM

## 2023-03-28 NOTE — Patient Instructions (Signed)
Insomnia Insomnia is a sleep disorder that makes it difficult to fall asleep or stay asleep. Insomnia can cause fatigue, low energy, difficulty concentrating, mood swings, and poor performance at work or school. There are three different ways to classify insomnia: Difficulty falling asleep. Difficulty staying asleep. Waking up too early in the morning. Any type of insomnia can be long-term (chronic) or short-term (acute). Both are common. Short-term insomnia usually lasts for 3 months or less. Chronic insomnia occurs at least three times a week for longer than 3 months. What are the causes? Insomnia may be caused by another condition, situation, or substance, such as: Having certain mental health conditions, such as anxiety and depression. Using caffeine, alcohol, tobacco, or drugs. Having gastrointestinal conditions, such as gastroesophageal reflux disease (GERD). Having certain medical conditions. These include: Asthma. Alzheimer's disease. Stroke. Chronic pain. An overactive thyroid gland (hyperthyroidism). Other sleep disorders, such as restless legs syndrome and sleep apnea. Menopause. Sometimes, the cause of insomnia may not be known. What increases the risk? Risk factors for insomnia include: Gender. Females are affected more often than males. Age. Insomnia is more common as people get older. Stress and certain medical and mental health conditions. Lack of exercise. Having an irregular work schedule. This may include working night shifts and traveling between different time zones. What are the signs or symptoms? If you have insomnia, the main symptom is having trouble falling asleep or having trouble staying asleep. This may lead to other symptoms, such as: Feeling tired or having low energy. Feeling nervous about going to sleep. Not feeling rested in the morning. Having trouble concentrating. Feeling irritable, anxious, or depressed. How is this diagnosed? This condition  may be diagnosed based on: Your symptoms and medical history. Your health care provider may ask about: Your sleep habits. Any medical conditions you have. Your mental health. A physical exam. How is this treated? Treatment for insomnia depends on the cause. Treatment may focus on treating an underlying condition that is causing the insomnia. Treatment may also include: Medicines to help you sleep. Counseling or therapy. Lifestyle adjustments to help you sleep better. Follow these instructions at home: Eating and drinking  Limit or avoid alcohol, caffeinated beverages, and products that contain nicotine and tobacco, especially close to bedtime. These can disrupt your sleep. Do not eat a large meal or eat spicy foods right before bedtime. This can lead to digestive discomfort that can make it hard for you to sleep. Sleep habits  Keep a sleep diary to help you and your health care provider figure out what could be causing your insomnia. Write down: When you sleep. When you wake up during the night. How well you sleep and how rested you feel the next day. Any side effects of medicines you are taking. What you eat and drink. Make your bedroom a dark, comfortable place where it is easy to fall asleep. Put up shades or blackout curtains to block light from outside. Use a white noise machine to block noise. Keep the temperature cool. Limit screen use before bedtime. This includes: Not watching TV. Not using your smartphone, tablet, or computer. Stick to a routine that includes going to bed and waking up at the same times every day and night. This can help you fall asleep faster. Consider making a quiet activity, such as reading, part of your nighttime routine. Try to avoid taking naps during the day so that you sleep better at night. Get out of bed if you are still awake after  15 minutes of trying to sleep. Keep the lights down, but try reading or doing a quiet activity. When you feel  sleepy, go back to bed. General instructions Take over-the-counter and prescription medicines only as told by your health care provider. Exercise regularly as told by your health care provider. However, avoid exercising in the hours right before bedtime. Use relaxation techniques to manage stress. Ask your health care provider to suggest some techniques that may work well for you. These may include: Breathing exercises. Routines to release muscle tension. Visualizing peaceful scenes. Make sure that you drive carefully. Do not drive if you feel very sleepy. Keep all follow-up visits. This is important. Contact a health care provider if: You are tired throughout the day. You have trouble in your daily routine due to sleepiness. You continue to have sleep problems, or your sleep problems get worse. Get help right away if: You have thoughts about hurting yourself or someone else. Get help right away if you feel like you may hurt yourself or others, or have thoughts about taking your own life. Go to your nearest emergency room or: Call 911. Call the National Suicide Prevention Lifeline at 814-131-7313 or 988. This is open 24 hours a day. Text the Crisis Text Line at (684)319-1939. Summary Insomnia is a sleep disorder that makes it difficult to fall asleep or stay asleep. Insomnia can be long-term (chronic) or short-term (acute). Treatment for insomnia depends on the cause. Treatment may focus on treating an underlying condition that is causing the insomnia. Keep a sleep diary to help you and your health care provider figure out what could be causing your insomnia. This information is not intended to replace advice given to you by your health care provider. Make sure you discuss any questions you have with your health care provider. Document Revised: 06/05/2021 Document Reviewed: 06/05/2021 Elsevier Patient Education  2024 [employer].

## 2023-03-28 NOTE — Progress Notes (Signed)
Virtual Visit via Video   This visit type was conducted due to national recommendations for restrictions regarding the COVID-19 Pandemic (e.g. social distancing) in an effort to limit this patient's exposure and mitigate transmission in our community.  Due to her co-morbid illnesses, this patient is at least at moderate risk for complications without adequate follow up.  This format is felt to be most appropriate for this patient at this time.  All issues noted in this document were discussed and addressed.  A limited physical exam was performed with this format.    This visit type was conducted due to national recommendations for restrictions regarding the COVID-19 Pandemic (e.g. social distancing) in an effort to limit this patient's exposure and mitigate transmission in our community.  Patients identity confirmed using two different identifiers.  This format is felt to be most appropriate for this patient at this time.  All issues noted in this document were discussed and addressed.  No physical exam was performed (except for noted visual exam findings with Video Visits).    THIS IS A FAILED VIDEO VISIT, SHE WAS UNABLE TO GIVE ACCESS TO CAMERA, SHE WAS ABLE TO GIVE ACCESS TO THE MICROPHONE.   Date:  03/31/2023   ID:  [name], DOB 06/06/1941, MRN [id number]  Patient Location:  Home  Provider location:   Office    Chief Complaint:  "I have med check"  History of Present Illness:    [name] is a 82 y.o. female who presents via video conferencing for a telehealth visit today.    The patient does not have symptoms concerning for COVID-19 infection (fever, chills, cough, or new shortness of breath).   Patient presents virtually for Trazodone follow up. She reports compliance with medication. She admits sleeping a little better. She is concerned about dry mouth since starting the medicine.   Insomnia Primary symptoms: no difficulty falling asleep, frequent awakening.   The  current episode started more than one month. The problem occurs nightly. The problem is unchanged. How many beverages per day that contain caffeine: 0 - 1.  Nothing relieves the symptoms. The treatment provided moderate relief. Typical bedtime:  8-10 P.M..  How long after going to bed to you fall asleep: 15-30 minutes.   PMH includes: hypertension, no family stress or anxiety.      Past Medical History:  Diagnosis Date   Arthritis    RIGHT KNEE PAIN AND OA   Chest pain 05/03/2015   Diabetes mellitus without complication (HCC)    Elevated cholesterol    GERD (gastroesophageal reflux disease)    History of kidney stones    Hypertension    Hypothyroidism    IBS (irritable bowel syndrome)    Localized swelling of both lower legs    Rotator cuff disorder    BOTH SHOULDERS - NO SURGERY - AND STATES LIMITATIONS IN ARM MOVEMENT   Shortness of breath 05/03/2015   Swelling    CHRONIC SWELLING RIGHT HAND AND BOTH FEET AND BOTH LEGS   Vertigo    Past Surgical History:  Procedure Laterality Date   ABDOMINAL HYSTERECTOMY  1985   BREAST SURGERY  1985   RIGHT BREAST BIOPSY - BENIGN   CARPAL TUNNEL RELEASE Left 02/05/2019   Procedure: LEFT CARPAL TUNNEL RELEASE;  Surgeon: Cindee Salt, MD;  Location: [facility];  Service: Orthopedics;  Laterality: Left;   CARPAL TUNNEL RELEASE Right 07/25/2021   Procedure: RIGHT CARPAL TUNNEL RELEASE;  Surgeon: Cindee Salt, MD;  Location: [facility];  Service: Orthopedics;  Laterality: Right;  Bier block   CHOLECYSTECTOMY  1975   EYE SURGERY  1995   BILATERAL CATARACT EXTRACTIONS; SURGERY FOR MACULAR HOLE    HEMORRHOID SURGERY  1975   JOINT REPLACEMENT     PARATHYROIDECTOMY Right 04/12/2022   Procedure: RIGHT INFERIOR PARATHYROIDECTOMY;  Surgeon: Darnell Level, MD;  Location: [facility];  Service: General;  Laterality: Right;   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 Swallow whole.   BENICAR 40 MG tablet TAKE 1 TABLET BY MOUTH DAILY   bimatoprost (LUMIGAN) 0.01 % SOLN Place 1 drop into both eyes at bedtime.    Blood Glucose Monitoring Suppl (ONETOUCH VERIO FLEX SYSTEM) w/Device KIT USE TO CHECK BLOOD SUGAR 3  TIMES DAILY   Carboxymethylcellulose Sodium (REFRESH LIQUIGEL) 1 % GEL Place 1 drop into both eyes at bedtime.   CRESTOR 20 MG tablet TAKE 1 TABLET BY MOUTH AT  BEDTIME   famotidine (PEPCID) 20 MG tablet Take 20 mg by mouth daily with breakfast.   glucose blood (ONETOUCH VERIO) test strip USE AS INSTRUCTED TO CHECK  BLOOD SUGAR 3 TIMES DAILY   Lancets (ONETOUCH DELICA PLUS LANCET33G) MISC USE ONE LANCET 3 TIMES  DAILY BEFORE MEALS   Lifitegrast (XIIDRA) 5 % SOLN Place 1 drop into both eyes daily. xiidra   magnesium oxide (MAG-OX) 400 (240 Mg) MG tablet TAKE 1 TABLET BY MOUTH EVERY DAY AFTER SUPPER   metFORMIN (GLUCOPHAGE-XR) 500 MG 24 hr tablet Take 1 tablet (500 mg total) by mouth 2 (two) times daily with a meal.   Multiple Vitamins-Minerals (MULTIVITAMIN WITH MINERALS) tablet Take 1 tablet by mouth daily. Centrum Silver   polyethylene glycol (MIRALAX / GLYCOLAX) packet Take 17 g by mouth daily.   SYNTHROID 50 MCG tablet TAKE 1 TABLET BY MOUTH DAILY   traZODone (DESYREL) 50 MG tablet Take 1 tablet (50 mg total) by mouth at bedtime.   vitamin B-6 (PYRIDOXINE) 25 MG tablet Take 25 mg by mouth daily.     Allergies:   Codeine, Tramadol, and Macrobid [nitrofurantoin macrocrystal]   Social History   Tobacco Use   Smoking status: Never   Smokeless tobacco: Never  Vaping Use   Vaping status: Never Used  Substance Use Topics   Alcohol use: Yes    Comment: Rare   Drug use: No     Family Hx: The patient's family history includes Breast cancer in her sister; Cancer in an other family member;  Dementia in her mother; Diabetes in her father, mother, and sister; Kidney cancer in her sister; Lung cancer in her maternal aunt and sister; Pancreatic cancer in her maternal aunt.  ROS:   Please see the history of present illness.    Review of Systems  Constitutional: Negative.   HENT: Negative.    Respiratory: Negative.    Cardiovascular: Negative.   Gastrointestinal: Negative.   Genitourinary: Negative.   Neurological: Negative.   Endo/Heme/Allergies: Negative.   Psychiatric/Behavioral:  The patient has insomnia.     All other systems reviewed and are negative.   Labs/Other Tests and Data Reviewed:    Recent Labs: 02/28/2023: ALT 26; TSH 0.932 03/18/2023: B Natriuretic Peptide 63.7; BUN 13; Creatinine, Ser 0.64; Hemoglobin 13.9;  Platelets 144; Potassium 3.6; Sodium 138   Recent Lipid Panel Lab Results  Component Value Date/Time   CHOL 149 10/22/2022 12:03 PM   TRIG 69 10/22/2022 12:03 PM   HDL 62 10/22/2022 12:03 PM   CHOLHDL 2.4 10/22/2022 12:03 PM   LDLCALC 73 10/22/2022 12:03 PM    Wt Readings from Last 3 Encounters:  03/18/23 212 lb (96.2 kg)  02/28/23 212 lb 6.4 oz (96.3 kg)  11/12/22 212 lb (96.2 kg)     Exam:    Vital Signs:  There were no vitals taken for this visit.    Physical Exam Vitals and nursing note reviewed.  Pulmonary:     Effort: Pulmonary effort is normal.     ASSESSMENT & PLAN:    Primary insomnia Assessment & Plan: Chronic, she will continue with trazodone 50mg  nightly for now. She is not sure if she will continue with meds when she runs out due to dry mouth. Advised to stay well hydrated and to place crushed ice in her mouth at bedtime.    Other chest pain Assessment & Plan: ER notes reviewed. She is encouraged to keep upcoming Cardiology appt.    Gastroesophageal reflux disease without esophagitis Assessment & Plan: I think this is likely contributing to her chest pain. Reminded to stop eating 3 hours prior to lying down and  to avoid known triggers. She is reminded to continue with famotidine daily. May need to switch to PPI therapy.       COVID-19 Education: The signs and symptoms of COVID-19 were discussed with the patient and how to seek care for testing (follow up with PCP or arrange E-visit).  The importance of social distancing was discussed today.  Patient Risk:   After full review of this patients clinical status, I feel that they are at least moderate risk at this time.  Time:   Today, I have spent 16 minutes/ seconds with the patient with telehealth technology discussing above diagnoses.  THIS IS A FAILED VIDEO VISIT, SHE WAS UNABLE TO GIVE ACCESS TO CAMERA, SHE WAS ABLE TO GIVE ACCESS TO THE MICROPHONE.    Medication Adjustments/Labs and Tests Ordered: Current medicines are reviewed at length with the patient today.  Concerns regarding medicines are outlined above.   Tests Ordered: No orders of the defined types were placed in this encounter.   Medication Changes: No orders of the defined types were placed in this encounter.   Disposition:  Follow up prn  Signed, [name], MD

## 2023-03-31 ENCOUNTER — Encounter: Payer: Self-pay | Admitting: Internal Medicine

## 2023-03-31 DIAGNOSIS — K219 Gastro-esophageal reflux disease without esophagitis: Secondary | ICD-10-CM | POA: Insufficient documentation

## 2023-03-31 NOTE — Assessment & Plan Note (Signed)
ER notes reviewed. She is encouraged to keep upcoming Cardiology appt.

## 2023-03-31 NOTE — Assessment & Plan Note (Signed)
I think this is likely contributing to her chest pain. Reminded to stop eating 3 hours prior to lying down and to avoid known triggers. She is reminded to continue with famotidine daily. May need to switch to PPI therapy.

## 2023-03-31 NOTE — Assessment & Plan Note (Signed)
Chronic, she will continue with trazodone 50mg  nightly for now. She is not sure if she will continue with meds when she runs out due to dry mouth. Advised to stay well hydrated and to place crushed ice in her mouth at bedtime.

## 2023-04-01 ENCOUNTER — Other Ambulatory Visit: Payer: Self-pay | Admitting: Internal Medicine

## 2023-04-08 ENCOUNTER — Other Ambulatory Visit: Payer: Medicare Other | Admitting: Pharmacist

## 2023-04-08 NOTE — Progress Notes (Signed)
04/08/2023 Name: [name] MRN: [id number] DOB: 23-Aug-1940  Chief Complaint  Patient presents with   Medication Management   Hypertension   Diabetes    [name] is a 82 y.o. year old female who presented for a telephone visit.   They were referred to the pharmacist by their PCP for assistance in managing diabetes, hypertension, and hyperlipidemia.    Subjective:  Care Team: Primary Care Provider: [name], MD ; Next Scheduled Visit: 05/22/23  Medication Access/Adherence  Current Pharmacy:  [facility]) - [place], [place] - [address] [facility] [address] [place] [address] [place] [address] Phone: [phone] Fax: [phone]  [facility] - [place], [place] - [address] [place] [address] Phone: [phone] Fax: [phone]  [id number] - [place], [place] - [address] [place] [address] Phone: [phone] Fax: [phone]   Patient reports affordability concerns with their medications: No  Patient reports access/transportation concerns to their pharmacy: No  Patient reports adherence concerns with their medications:  No     Diabetes:  Current medications: metformin XR 500 mg twice daily  Medications tried in the past: Jardiance - nausea; Ozempic 0.25 mg - nausea   Denies any GI upset, nausea since increasing metformin. Notes some glucose gluc  Current glucose readings: fastings 120-130s; 2 hour post prandial: has not been checking  Patient denies hypoglycemic s/sx including dizziness, shakiness, sweating. Patient denies hyperglycemic symptoms including polyuria, polydipsia, polyphagia, nocturia, neuropathy, blurred vision.   Hypertension:  Current medications: olmesartan 40 mg daily, furosemide 20 mg daily  Patient has a validated, automated, upper arm home BP cuff, but is not  resting/relaxing before checking   Hyperlipidemia/ASCVD Risk Reduction  Current lipid lowering medications: rosuvastatin 40 mg daily   Antiplatelet regimen: aspirin 81 mg daily    Objective:  Lab Results  Component Value Date   HGBA1C 7.1 (H) 02/28/2023    Lab Results  Component Value Date   CREATININE 0.64 03/18/2023   BUN 13 03/18/2023   NA 138 03/18/2023   K 3.6 03/18/2023   CL 107 03/18/2023   CO2 23 03/18/2023    Lab Results  Component Value Date   CHOL 149 10/22/2022   HDL 62 10/22/2022   LDLCALC 73 10/22/2022   TRIG 69 10/22/2022   CHOLHDL 2.4 10/22/2022    Medications Reviewed Today     Reviewed by [name], RPH-CPP (Pharmacist) on 04/08/23 at 1259  Med List Status: <None>   Medication Order Taking? Sig Documenting Provider Last Dose Status Informant  albuterol (VENTOLIN HFA) 108 (90 Base) MCG/ACT inhaler [id number]  Inhale 1 puff into the lungs as needed.  Patient not taking: Reported on 02/28/2023   [provider]  Active   aspirin EC 81 MG tablet [id number]  Take 81 mg by mouth daily. Swallow whole. [provider]  Active Self  BENICAR 40 MG tablet [id number] Yes TAKE 1 TABLET BY MOUTH DAILY Stephanie Peng, MD Taking Active   bimatoprost (LUMIGAN) 0.01 % SOLN [id number]  Place 1 drop into both eyes at bedtime.  [provider]  Active Self  Blood Glucose Monitoring Suppl (ONETOUCH VERIO FLEX SYSTEM) w/Device KIT [id number]  USE TO CHECK BLOOD SUGAR 3  TIMES DAILY Stephanie Peng, MD  Active Self  Carboxymethylcellulose Sodium (REFRESH LIQUIGEL) 1 % GEL [id number]  Place 1 drop into both eyes at bedtime. [provider]  Active Self  CRESTOR 20 MG tablet [id number] Yes TAKE 1 TABLET BY MOUTH AT  BEDTIME Stephanie Peng, MD Taking Active   famotidine (PEPCID) 20 MG tablet [id number]  Take 20 mg by mouth daily with breakfast. [provider]  Active Self  furosemide (LASIX) 20 MG tablet [id number]  TAKE 1 TABLET  BY MOUTH IN THE  Nelly Laurence, MD  Active   glucose blood [facility] VERIO) test strip [id number]  USE AS INSTRUCTED TO CHECK  BLOOD SUGAR 3 TIMES DAILY Stephanie Peng, MD  Active Self  Lancets [facility] K[facility] PLUS [place]) MISC [id number]  USE ONE LANCET 3 TIMES  DAILY BEFORE MEALS Stephanie Peng, MD  Active Self  Lifitegrast Benay Spice) 5 % SOLN [id number]  Place 1 drop into both eyes daily. xiidra [provider]  Active Self  magnesium oxide (MAG-OX) 400 (240 Mg) MG tablet [id number]  TAKE 1 TABLET BY MOUTH EVERY DAY AFTER SUPPER Stephanie Peng, MD  Active   metFORMIN (GLUCOPHAGE-XR) 500 MG 24 hr tablet [id number] Yes Take 1 tablet (500 mg total) by mouth 2 (two) times daily with a meal. Stephanie Peng, MD Taking Active   Multiple Vitamins-Minerals (MULTIVITAMIN WITH MINERALS) tablet [id number]  Take 1 tablet by mouth daily. Centrum Silver [provider]  Active Self  nystatin powder [id number]  Apply 1 Application topically 3 (three) times daily.  Patient not taking: Reported on 02/28/2023   Stephanie Peng, MD  Active   polyethylene glycol [facility] / Ethelene Hal) packet [id number]  Take 17 g by mouth daily. [provider]  Active Self  SYNTHROID 50 MCG tablet [id number]  TAKE 1 TABLET BY MOUTH DAILY Stephanie Peng, MD  Active   traZODone (DESYREL) 50 MG tablet [id number]  Take 1 tablet (50 mg total) by mouth at bedtime. Stephanie Peng, MD  Active   triamcinolone cream (KENALOG) 0.1 % [id number]  APPLY TO AFFECTED AREA TWICE DAILY AS NEEDED  Patient not taking: Reported on 10/23/2022   Stephanie Peng, MD  Active   vitamin B-6 (PYRIDOXINE) 25 MG tablet [id number]  Take 25 mg by mouth daily. [provider]  Active Self              Assessment/Plan:   Diabetes: - Currently uncontrolled - Reviewed long term cardiovascular and renal outcomes of uncontrolled blood sugar - Reviewed goal A1c, goal fasting, and goal 2 hour post prandial glucose - Recommend to  continue current regimen at this time.  - Recommend to check glucose twice daily, fasting and 2 hour post prandial    Hypertension: - Currently uncontrolled per home readings.  - Reviewed long term cardiovascular and renal outcomes of uncontrolled blood pressure - Reviewed appropriate blood pressure monitoring technique and reviewed goal blood pressure. Recommended to check home blood pressure and heart rate periodically, using appropriate home blood pressure monitoring technique.  - Recommend to continue current regimen at this time  Hyperlipidemia/ASCVD Risk Reduction: - Currently close to controlled.  - Recommend to continue current regimen at this time   Follow Up Plan: phone call in 4 weeks  Catie TClearance Coots, PharmD, BCACP, CPP Clinical Pharmacist [facility] [phone]

## 2023-04-08 NOTE — Patient Instructions (Signed)
Janeann,   It was great talking to you today!  Check your blood sugars twice daily:  1) Fasting, first thing in the morning before breakfast and  2) 2 hours after your largest meal.   For a goal A1c of less than 7%, goal fasting readings are less than 130 and goal 2 hour after meal readings are less than 180.    Check your blood pressure twice weekly, and any time you have concerning symptoms like headache, chest pain, dizziness, shortness of breath, or vision changes.   Our goal is less than 130/80.  To appropriately check your blood pressure, make sure you do the following:  1) Avoid caffeine, exercise, or tobacco products for 30 minutes before checking. Empty your bladder. 2) Sit with your back supported in a flat-backed chair. Rest your arm on something flat (arm of the chair, table, etc). 3) Sit still with your feet flat on the floor, resting, for at least 5 minutes.  4) Check your blood pressure. Take 1-2 readings.  5) Write down these readings and bring with you to any provider appointments.  Bring your home blood pressure machine with you to a provider's office for accuracy comparison at least once a year.   Make sure you take your blood pressure medications before you come to any office visit, even if you were asked to fast for labs.   Take care!  [name], PharmD, BCACP, CPP Clinical Pharmacist [facility] [phone]

## 2023-04-12 ENCOUNTER — Ambulatory Visit: Payer: Medicare Other | Attending: Cardiology | Admitting: Cardiology

## 2023-04-12 ENCOUNTER — Encounter: Payer: Self-pay | Admitting: Cardiology

## 2023-04-12 VITALS — BP 144/60 | HR 77 | Resp 16 | Ht 63.0 in | Wt 213.6 lb

## 2023-04-12 DIAGNOSIS — Z79899 Other long term (current) drug therapy: Secondary | ICD-10-CM | POA: Diagnosis not present

## 2023-04-12 DIAGNOSIS — R600 Localized edema: Secondary | ICD-10-CM | POA: Diagnosis not present

## 2023-04-12 DIAGNOSIS — I10 Essential (primary) hypertension: Secondary | ICD-10-CM | POA: Diagnosis not present

## 2023-04-12 DIAGNOSIS — R0609 Other forms of dyspnea: Secondary | ICD-10-CM

## 2023-04-12 MED ORDER — SPIRONOLACTONE 25 MG PO TABS: 25.0000 mg | ORAL_TABLET | ORAL | 2 refills | Status: DC

## 2023-04-12 NOTE — Patient Instructions (Addendum)
Medication Instructions:  Your physician has recommended you make the following change in your medication:   1) START spironolactone 25 mg daily (in the mornings)  Follow-up with your primary care provider for refills and management of this medication.  *If you need a refill on your cardiac medications before your next appointment, please call your pharmacy*  Lab Work: In 3-4 weeks: BMP (you can come to the lab any time between 7:30 AM and 4:30 PM) If you have labs (blood work) drawn today and your tests are completely normal, you will receive your results only by: MyChart Message (if you have MyChart) OR A paper copy in the mail If you have any lab test that is abnormal or we need to change your treatment, we will call you to review the results.  Testing/Procedures: None ordered today.  Follow-Up: At [facility], you and your health needs are our priority.  As part of our continuing mission to provide you with exceptional heart care, we have created designated Provider Care Teams.  These Care Teams include your primary Cardiologist (physician) and Advanced Practice Providers (APPs -  Physician Assistants and Nurse Practitioners) who all work together to provide you with the care you need, when you need it.  Your next appointment:   As needed  The format for your next appointment:   In Person  Provider:   Yates Decamp, MD {

## 2023-04-12 NOTE — Progress Notes (Unsigned)
Cardiology Office Note:  .   Date:  04/13/2023  ID:  [name], DOB [birth year], MRN [id number] PCP: [name], MD  [facility] Providers Cardiologist:  Yates Decamp, MD    History of Present Illness: .   [name] is a 82 y.o.  AA female with history of hypertension, hyperlipidemia, diabetes mellitus, hypothyroidism, vertigo seen by me about a year ago for follow-up of syncope that she had in November 2022.  She has had a low risk negative ischemic nuclear stress test and essentially normal echocardiogram with mild LVH in December 2022.  Discussed the use of AI scribe software for clinical note transcription with the patient, who gave verbal consent to proceed.  History of Present Illness   The patient, with a history of hypertension and arthritis, presents with shortness of breath that began 'two or three weeks ago.' She reports that she 'can't walk like I used to' and becomes short of breath with exertion. She denies recent travel or significant dietary changes. She has noticed some weight fluctuation but overall it has been stable. She has chronic leg swelling and reports no new swelling. She denies orthopnea and paroxysmal nocturnal dyspnea, sleeping with one pillow. She reports that her blood pressure has been 'up and down lately' and 'a little higher' than usual. 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      Review of Systems  Cardiovascular:  Positive for dyspnea on exertion and leg swelling. Negative for chest pain.    Risk Assessment/Calculations:     Lab Results  Component Value Date   CHOL 149 10/22/2022   HDL 62 10/22/2022   LDLCALC 73 10/22/2022   TRIG 69 10/22/2022   CHOLHDL 2.4 10/22/2022   Lab Results  Component Value Date   NA 138 03/18/2023   K 3.6 03/18/2023   CO2 23 03/18/2023   GLUCOSE 128 (H) 03/18/2023   BUN 13 03/18/2023   CREATININE 0.64 03/18/2023   CALCIUM 8.9 03/18/2023   EGFR 88  02/28/2023   GFRNONAA >60 03/18/2023   Lab Results  Component Value Date   WBC 3.9 (L) 03/18/2023   HGB 13.9 03/18/2023   HCT 40.9 03/18/2023   MCV 89.9 03/18/2023   PLT 144 (L) 03/18/2023   Physical Exam:   VS:  BP (!) 144/60 (BP Location: Right Arm, Patient Position: Sitting, Cuff Size: Large)   Pulse 77   Resp 16   Ht 5\' 3"  (1.6 m)   Wt 213 lb 9.6 oz (96.9 kg)   SpO2 97%   BMI 37.84 kg/m    Wt Readings from Last 3 Encounters:  04/12/23 213 lb 9.6 oz (96.9 kg)  03/18/23 212 lb (96.2 kg)  02/28/23 212 lb 6.4 oz (96.3 kg)     Physical Exam Constitutional:      Appearance: She is obese.  Neck:     Vascular: No carotid bruit or JVD.  Cardiovascular:     Rate and Rhythm: Normal rate and regular rhythm.     Pulses: Intact distal pulses.     Heart sounds: Normal heart sounds. No murmur heard.    No gallop.  Pulmonary:     Effort: Pulmonary effort is normal.     Breath sounds: Normal breath sounds.  Abdominal:     General: Bowel sounds are normal.     Palpations: Abdomen is soft.  Musculoskeletal:     Right lower leg: Edema (2+ pitting below knee)  present.     Left lower leg: Edema (2+ pitting below knee) present.     Studies Reviewed: Marland Kitchen        Ambulatory cardiac telemetry 14 days 06/08/2021 - 06/22/2021: Predominant underlying rhythm was sinus with single episode of SVT lasting 6 beats at a maximum rate of 167 bpm.  Rare PACs and PVCs.  There were no patient triggered events.  No evidence of significant cardiac arrhythmias.   Lexiscan Tetrofosmin stress test 06/21/2021: No previous exam available for comparison. Lexiscan nuclear stress test performed using 1-day protocol. Mild decrease in apical counts likely due to breast tissue attenuation, with imaging performed in sitting position. Otherwise, normal myocardial perfusion. Stress LVEF 55%. Low risk study.   ABI 06/20/2021:  This exam reveals normal perfusion of the right lower extremity (ABI 1.14)  and  normal perfusion of the left lower extremity (ABI 1.17). Mildly  abnormal biphasic waveform pattern left DP, otherwise normal triphasic waveform pattern.   Echocardiogram 06/20/2021:  Study Quality: Technically difficult study.  Normal LV systolic function with visual EF 60%. Left ventricle cavity is  normal in size. Mild left ventricular hypertrophy. Normal global wall  motion. Normal diastolic filling pattern, normal LAP. Calculated EF 60%.  No significant valvular heart disease.  No prior study for comparison.    ASSESSMENT AND PLAN: .      ICD-10-CM   1. Dyspnea on exertion  R06.09 Basic metabolic panel    2. Primary hypertension  I10 spironolactone (ALDACTONE) 25 MG tablet    Basic metabolic panel    3. Bilateral leg edema  R60.0 spironolactone (ALDACTONE) 25 MG tablet    Basic metabolic panel    4. Medication management  Z79.899 Basic metabolic panel      Assessment and Plan    Exertional Dyspnea New onset over the past 2-3 weeks. No recent travel or significant weight changes. No orthopnea or paroxysmal nocturnal dyspnea. Physical exam does not suggest heart failure. Likely related to recent blood pressure fluctuations. -Start Spironolactone 25mg  daily to help control blood pressure and manage fluid retention. -Advise to reduce salt intake, specifically avoid chips and dips. -Order blood work in 3 weeks to monitor response to new medication.  Hypertension Recent fluctuations with some higher readings. Currently on unspecified antihypertensive medication. -Continue current antihypertensive medication. -Add Spironolactone 25mg  daily. -Follow up with primary care provider (Dr. Allyne Gee) in November for blood pressure management and medication refills. I will check BMP in view of medication change.   Chronic Leg Edema Longstanding issue, probably also related to excess salt intake and sedentary lifestyle.  -Start Spironolactone 25mg  daily to help manage fluid  retention. -Advise to reduce salt intake, specifically avoid chips and dips.  Arthritis Chronic issue, causing nocturnal arm pain. Likely spinal origin. -No changes to current management plan discussed.     Otherwise stable from cardiac standpoint, she cn f/u with her PCP for further management of hypertension and continued primary prevention.   Signed,  Yates Decamp, MD, [facility] 04/13/2023, 1:49 PM

## 2023-05-03 ENCOUNTER — Ambulatory Visit: Payer: Medicare Other

## 2023-05-06 ENCOUNTER — Other Ambulatory Visit (INDEPENDENT_AMBULATORY_CARE_PROVIDER_SITE_OTHER): Payer: Medicare Other | Admitting: Pharmacist

## 2023-05-06 DIAGNOSIS — I10 Essential (primary) hypertension: Secondary | ICD-10-CM

## 2023-05-06 DIAGNOSIS — I700 Atherosclerosis of aorta: Secondary | ICD-10-CM

## 2023-05-06 DIAGNOSIS — E785 Hyperlipidemia, unspecified: Secondary | ICD-10-CM

## 2023-05-06 NOTE — Progress Notes (Signed)
05/06/2023 Name: [name] MRN: [id number] DOB: 1941/06/23  Chief Complaint  Patient presents with   Medication Management   Diabetes   Hypertension    [name] is a 82 y.o. year old female who presented for a telephone visit.   They were referred to the pharmacist by their PCP for assistance in managing diabetes, hypertension, and hyperlipidemia.    Subjective:  Care Team: Primary Care Provider: [name], MD ; Next Scheduled Visit: 05/22/23  Medication Access/Adherence  Current Pharmacy:  [facility]) - [place], [place] - [address] [facility] [address] [place] [address] [place] [address] Phone: [phone] Fax: [phone]  [name] - [place], [place] - [address] [place] [address] Phone: [phone] Fax: [phone]  [id number] - [place], [place] - [address] [name] [address] Phone: [phone] Fax: [phone]   Patient reports affordability concerns with their medications: No  Patient reports access/transportation concerns to their pharmacy: No  Patient reports adherence concerns with their medications:  No     Diabetes:  Current medications: metformin XR 500 mg twice daily Medications tried in the past: Jardiance - nausea; Ozempic 0.25 mg - nausea   Current glucose readings: fastings: 120-130s; 2 hour post prandial: 110-163  Patient denies hypoglycemic s/sx including dizziness, shakiness, sweating. Patient denies hyperglycemic symptoms including polyuria, polydipsia, polyphagia, nocturia, neuropathy, blurred vision.  Hypertension:  Current medications: Benicar 40 mg daily, furosemide 20 mg daily, spironolactone 25 mg daily   Reports she did not start spironolactone as her blood pressures are generally well controlled at home.   Patient has a validated, automated, upper arm  home BP cuff Current blood pressure readings readings: 120-130s/50-60s  Patient denies hypotensive s/sx including dizziness, lightheadedness.   Hyperlipidemia/ASCVD Risk Reduction  Current lipid lowering medications: rosuvastatin 40 mg daily  Antiplatelet regimen: aspirin 81 mg daily  Objective:  Lab Results  Component Value Date   HGBA1C 7.1 (H) 02/28/2023    Lab Results  Component Value Date   CREATININE 0.64 03/18/2023   BUN 13 03/18/2023   NA 138 03/18/2023   K 3.6 03/18/2023   CL 107 03/18/2023   CO2 23 03/18/2023    Lab Results  Component Value Date   CHOL 149 10/22/2022   HDL 62 10/22/2022   LDLCALC 73 10/22/2022   TRIG 69 10/22/2022   CHOLHDL 2.4 10/22/2022    Assessment/Plan:   Diabetes: - Currently controlled per glucose readings.  - Reviewed goal A1c, goal fasting, and goal 2 hour post prandial glucose - Recommend to continue current regimen. Check A1c with next visit.  - Recommend to check glucose fasting and 2 hour post prandial.   Hypertension: - Currently uncontrolled per last cardiology visit, patient has not started spironolactone. We discussed use of furosemide daily can have long term renal implications, it may be more beneficial to be on spironolactone daily and reduce need for furosemide. Patient amenable to start with 1/2 spironolactone (12.5 mg daily) and hold furosemide. She will start next week when she is back from a trip. BMP with next PCP visit.  - Reviewed appropriate blood pressure monitoring technique and reviewed goal blood pressure. Recommended to check home blood pressure and heart rate periodically.  - Recommend to continue Benicar.   Hyperlipidemia/ASCVD Risk Reduction: - Currently uncontrolled, near goal LDL <70. Patient  has a diagnosis of claudication, but notes she has never been diagnosed with PAD/PVD.  - Recommend to continue current regimen. Follow lipids.   Follow Up Plan: PCP in 3 weeks, phone call in 6 weeks.   Catie  Eppie Gibson, PharmD, BCACP, CPP Clinical Pharmacist [facility] [phone]

## 2023-05-06 NOTE — Patient Instructions (Signed)
[name],   It was great talking to you today!  Try taking 1/2 spironolactone daily. Hold on your furosemide (Lasix) for now. Check your blood pressure.   [name], PharmD, BCACP, CPP Clinical Pharmacist [facility] [phone]

## 2023-05-08 ENCOUNTER — Other Ambulatory Visit: Payer: Self-pay

## 2023-05-17 ENCOUNTER — Other Ambulatory Visit: Payer: Self-pay | Admitting: Internal Medicine

## 2023-05-22 ENCOUNTER — Ambulatory Visit: Payer: Medicare Other

## 2023-05-22 ENCOUNTER — Encounter: Payer: Self-pay | Admitting: Internal Medicine

## 2023-05-22 ENCOUNTER — Ambulatory Visit: Payer: Medicare Other | Admitting: Internal Medicine

## 2023-05-22 VITALS — BP 126/80 | HR 81 | Temp 98.0°F | Ht 63.0 in | Wt 212.0 lb

## 2023-05-22 VITALS — BP 128/60 | HR 81 | Temp 98.0°F | Ht 63.2 in | Wt 212.4 lb

## 2023-05-22 DIAGNOSIS — K219 Gastro-esophageal reflux disease without esophagitis: Secondary | ICD-10-CM

## 2023-05-22 DIAGNOSIS — I700 Atherosclerosis of aorta: Secondary | ICD-10-CM | POA: Diagnosis not present

## 2023-05-22 DIAGNOSIS — Z6837 Body mass index (BMI) 37.0-37.9, adult: Secondary | ICD-10-CM

## 2023-05-22 DIAGNOSIS — E785 Hyperlipidemia, unspecified: Secondary | ICD-10-CM

## 2023-05-22 DIAGNOSIS — Z0000 Encounter for general adult medical examination without abnormal findings: Secondary | ICD-10-CM

## 2023-05-22 DIAGNOSIS — I119 Hypertensive heart disease without heart failure: Secondary | ICD-10-CM | POA: Diagnosis not present

## 2023-05-22 DIAGNOSIS — E66812 Obesity, class 2: Secondary | ICD-10-CM

## 2023-05-22 DIAGNOSIS — F5101 Primary insomnia: Secondary | ICD-10-CM

## 2023-05-22 DIAGNOSIS — E1169 Type 2 diabetes mellitus with other specified complication: Secondary | ICD-10-CM

## 2023-05-22 DIAGNOSIS — R109 Unspecified abdominal pain: Secondary | ICD-10-CM

## 2023-05-22 LAB — HEMOGLOBIN A1C
Est. average glucose Bld gHb Est-mCnc: 146 mg/dL
Hgb A1c MFr Bld: 6.7 % — ABNORMAL HIGH (ref 4.8–5.6)

## 2023-05-22 NOTE — Progress Notes (Signed)
Subjective:   [name] is a 82 y.o. female who presents for Medicare Annual (Subsequent) preventive examination.  Visit Complete: In person  Patient Medicare AWV questionnaire was completed by the patient on 05/18/2023; I have confirmed that all information answered by patient is correct and no changes since this date.  Cardiac Risk Factors include: advanced age (>61men, >45 women);diabetes mellitus;dyslipidemia;hypertension;obesity (BMI >30kg/m2)     Objective:    Today's Vitals   05/22/23 1030 05/22/23 1031  BP: 128/60   Pulse: 81   Temp: 98 F (36.7 C)   TempSrc: Oral   SpO2: 98%   Weight: 212 lb 6.4 oz (96.3 kg)   Height: 5' 3.2" (1.605 m)   PainSc:  6    Body mass index is 37.39 kg/m.     05/22/2023   10:38 AM 03/18/2023   10:50 AM 11/12/2022    2:14 PM 05/02/2022    9:01 AM 03/30/2022   11:09 AM 07/25/2021    8:58 AM 07/18/2021   10:49 AM  Advanced Directives  Does Patient Have a Medical Advance Directive? Yes No No Yes Yes Yes Yes  Type of Estate agent of [place];Living will   Healthcare Power of [place];Living will Healthcare Power of [place];Living will Healthcare Power of [employer] Power of Attorney  Does patient want to make changes to medical advance directive?      No - Patient declined No - Patient declined  Copy of Healthcare Power of Attorney in Chart? Yes - validated most recent copy scanned in chart (See row information)   Yes - validated most recent copy scanned in chart (See row information) No - copy requested No - copy requested     Current Medications (verified) Outpatient Encounter Medications as of 05/22/2023  Medication Sig   albuterol (VENTOLIN HFA) 108 (90 Base) MCG/ACT inhaler Inhale 1 puff into the lungs as needed.   aspirin EC 81 MG tablet Take 81 mg by mouth daily. Swallow whole.   BENICAR 40 MG tablet TAKE 1 TABLET BY MOUTH DAILY   bimatoprost (LUMIGAN) 0.01 % SOLN Place 1 drop into both eyes at  bedtime.    Blood Glucose Monitoring Suppl (ONETOUCH VERIO FLEX SYSTEM) w/Device KIT USE TO CHECK BLOOD SUGAR 3  TIMES DAILY   Carboxymethylcellulose Sodium (REFRESH LIQUIGEL) 1 % GEL Place 1 drop into both eyes at bedtime.   CRESTOR 20 MG tablet TAKE 1 TABLET BY MOUTH AT  BEDTIME   famotidine (PEPCID) 20 MG tablet Take 20 mg by mouth daily with breakfast.   Lancets (ONETOUCH DELICA PLUS LANCET33G) MISC USE ONE LANCET 3 TIMES  DAILY BEFORE MEALS   Lifitegrast (XIIDRA) 5 % SOLN Place 1 drop into both eyes daily. xiidra   magnesium oxide (MAG-OX) 400 (240 Mg) MG tablet TAKE 1 TABLET BY MOUTH EVERY DAY AFTER SUPPER   metFORMIN (GLUCOPHAGE-XR) 500 MG 24 hr tablet Take 1 tablet (500 mg total) by mouth 2 (two) times daily with a meal.   Multiple Vitamins-Minerals (MULTIVITAMIN WITH MINERALS) tablet Take 1 tablet by mouth daily. Centrum Silver   ONETOUCH VERIO test strip USE AS INSTRUCTED TO CHECK BLOOD SUGAR 3 TIMES DAILY   polyethylene glycol (MIRALAX / GLYCOLAX) packet Take 17 g by mouth daily.   spironolactone (ALDACTONE) 25 MG tablet Take 1 tablet (25 mg total) by mouth every morning.   SYNTHROID 50 MCG tablet TAKE 1 TABLET BY MOUTH DAILY   triamcinolone cream (KENALOG) 0.1 % APPLY TO AFFECTED AREA TWICE DAILY  AS NEEDED   vitamin B-6 (PYRIDOXINE) 25 MG tablet Take 25 mg by mouth daily.   furosemide (LASIX) 20 MG tablet TAKE 1 TABLET BY MOUTH IN THE  MORNING (Patient not taking: Reported on 05/22/2023)   nystatin powder Apply 1 Application topically 3 (three) times daily. (Patient not taking: Reported on 05/06/2023)   traZODone (DESYREL) 50 MG tablet Take 1 tablet (50 mg total) by mouth at bedtime. (Patient not taking: Reported on 05/06/2023)   No facility-administered encounter medications on file as of 05/22/2023.    Allergies (verified) Codeine, Tramadol, and Macrobid [nitrofurantoin macrocrystal]   History: Past Medical History:  Diagnosis Date   Arthritis    RIGHT KNEE PAIN AND OA    Cataract    Chest pain 05/03/2015   Diabetes mellitus without complication (HCC)    Elevated cholesterol    GERD (gastroesophageal reflux disease)    Glaucoma 2018   History of kidney stones    Hypertension    Hypothyroidism    IBS (irritable bowel syndrome)    Localized swelling of both lower legs    Rotator cuff disorder    BOTH SHOULDERS - NO SURGERY - AND STATES LIMITATIONS IN ARM MOVEMENT   Shortness of breath 05/03/2015   Swelling    CHRONIC SWELLING RIGHT HAND AND BOTH FEET AND BOTH LEGS   Vertigo    Past Surgical History:  Procedure Laterality Date   ABDOMINAL HYSTERECTOMY  1985   BREAST SURGERY  1985   RIGHT BREAST BIOPSY - BENIGN   CARPAL TUNNEL RELEASE Left 02/05/2019   Procedure: LEFT CARPAL TUNNEL RELEASE;  Surgeon: Cindee Salt, MD;  Location: [facility];  Service: Orthopedics;  Laterality: Left;   CARPAL TUNNEL RELEASE Right 07/25/2021   Procedure: RIGHT CARPAL TUNNEL RELEASE;  Surgeon: Cindee Salt, MD;  Location: [facility];  Service: Orthopedics;  Laterality: Right;  Bier block   CHOLECYSTECTOMY  1975   EYE SURGERY  1995   BILATERAL CATARACT EXTRACTIONS; SURGERY FOR MACULAR HOLE    HEMORRHOID SURGERY  1975   JOINT REPLACEMENT     PARATHYROIDECTOMY Right 04/12/2022   Procedure: RIGHT INFERIOR PARATHYROIDECTOMY;  Surgeon: Darnell Level, MD;  Location: [facility];  Service: General;  Laterality: Right;   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   Procedure: RIGHT TOTAL KNEE ARTHROPLASTY;  Surgeon: Loanne Drilling, MD;  Location: [facility];  Service: Orthopedics;  Laterality: Right;   Family History  Problem Relation Age of Onset   Diabetes Mother    Dementia Mother    Diabetes Father    Diabetes Sister    Breast cancer Sister    Kidney cancer Sister    Cancer Sister    Lung cancer Sister    Lung cancer Maternal Aunt    Pancreatic cancer Maternal Aunt    Cancer Other    Cancer Sister    Diabetes Sister    Social History    Socioeconomic History   Marital status: Widowed    Spouse name: nathaniel   Number of children: 4   Years of education: 12   Highest education level: GED or equivalent  Occupational History   Occupation: Retired  Tobacco Use   Smoking status: Never   Smokeless tobacco: Never  Vaping Use   Vaping status: Never Used  Substance and Sexual Activity   Alcohol use: Yes    Comment: Rare   Drug use: No   Sexual activity: Not Currently    Birth control/protection: Surgical  Other Topics  Concern   Not on file  Social History Narrative   [name]   Patient is retired.    Patient has 4 children.    Patient has a high school education.    Patient is widowed   Right handed      Epworth Sleepiness Scale = 1 (as of 05/03/2015)   Social Determinants of Health   Financial Resource Strain: Low Risk  (05/18/2023)   Overall Financial Resource Strain (CARDIA)    Difficulty of Paying Living Expenses: Not hard at all  Food Insecurity: No Food Insecurity (05/18/2023)   Hunger Vital Sign    Worried About Running Out of Food in the Last Year: Never true    Ran Out of Food in the Last Year: Never true  Transportation Needs: No Transportation Needs (05/18/2023)   PRAPARE - Administrator, Civil Service (Medical): No    Lack of Transportation (Non-Medical): No  Physical Activity: Sufficiently Active (05/18/2023)   Exercise Vital Sign    Days of Exercise per Week: 4 days    Minutes of Exercise per Session: 60 min  Stress: No Stress Concern Present (05/18/2023)   [employer] of Occupational Health - Occupational Stress Questionnaire    Feeling of Stress : Not at all  Social Connections: Moderately Isolated (05/18/2023)   Social Connection and Isolation Panel [NHANES]    Frequency of Communication with Friends and Family: Twice a week    Frequency of Social Gatherings with Friends and Family: Once a week    Attends Religious Services: More than 4 times per  year    Active Member of [employer] or Organizations: No    Attends Banker Meetings: Never    Marital Status: Widowed    Tobacco Counseling Counseling given: Not Answered   Clinical Intake:  Pre-visit preparation completed: Yes  Pain : 0-10 Pain Score: 6  Pain Type: Acute pain Pain Location: Back Pain Orientation: Right, Mid Pain Descriptors / Indicators: Sharp Pain Onset: More than a month ago Pain Frequency: Intermittent     Nutritional Status: BMI > 30  Obese Nutritional Risks: None Diabetes: Yes CBG done?: No Did pt. bring in CBG monitor from home?: No  How often do you need to have someone help you when you read instructions, pamphlets, or other written materials from your doctor or pharmacy?: 1 - Never  Interpreter Needed?: No  Information entered by :: NAllen LPN   Activities of Daily Living    05/18/2023    2:28 PM  In your present state of health, do you have any difficulty performing the following activities:  Hearing? 0  Vision? 0  Difficulty concentrating or making decisions? 0  Walking or climbing stairs? 1  Comment due to knees  Dressing or bathing? 0  Doing errands, shopping? 0  Preparing Food and eating ? N  Using the Toilet? N  In the past six months, have you accidently leaked urine? N  Do you have problems with loss of bowel control? N  Managing your Medications? N  Managing your Finances? N  Housekeeping or managing your Housekeeping? N    Patient Care Team: [name], MD as PCP - General (Internal Medicine) Yates Decamp, MD as PCP - Cardiology (Cardiology) Harlan Stains, [facility] (Inactive) (Pharmacist) Chalmers Guest, MD as Consulting Physician (Ophthalmology)  Indicate any recent Medical Services you may have received from other than [facility] providers in the past year (date may be approximate).  Assessment:   This is a routine wellness examination for Jayne.  Hearing/Vision screen Hearing Screening - Comments::  Denies hearing issues Vision Screening - Comments:: Regular eye exams, Dr. Harlon Flor   Goals Addressed             This Visit's Progress    Patient Stated       05/22/2023, wants to lose weight        Depression Screen    05/22/2023   10:39 AM 02/28/2023    9:49 AM 10/22/2022   10:50 AM 08/23/2022   10:29 AM 05/02/2022    9:04 AM 03/29/2021    2:58 PM 04/28/2020    2:07 PM  PHQ 2/9 Scores  PHQ - 2 Score 0 0 0 1 0 0 0  PHQ- 9 Score 4 0 0        Fall Risk    05/18/2023    2:28 PM 02/28/2023    9:49 AM 10/22/2022   10:49 AM 08/23/2022   10:28 AM 05/02/2022    9:04 AM  Fall Risk   Falls in the past year? 1 0 0 0 0  Comment missed a step      Number falls in past yr: 0 0 0 0 0  Injury with Fall? 0 0 0 0 0  Risk for fall due to : Medication side effect No Fall Risks No Fall Risks No Fall Risks Medication side effect  Follow up Falls prevention discussed;Falls evaluation completed Falls evaluation completed Falls evaluation completed Falls evaluation completed Falls prevention discussed;Education provided;Falls evaluation completed    MEDICARE RISK AT HOME: Medicare Risk at Home Any stairs in or around the home?: No Home free of loose throw rugs in walkways, pet beds, electrical cords, etc?: Yes Adequate lighting in your home to reduce risk of falls?: Yes Life alert?: No Use of a cane, walker or w/c?: No Grab bars in the bathroom?: Yes Shower chair or bench in shower?: No Elevated toilet seat or a handicapped toilet?: Yes  TIMED UP AND GO:  Was the test performed?  Yes  Length of time to ambulate 10 feet: 6 sec Gait slow and steady without use of assistive device    Cognitive Function:        05/22/2023   10:40 AM 05/02/2022    9:05 AM 03/29/2021    2:59 PM 12/23/2019   11:25 AM 01/27/2019    2:24 PM  6CIT Screen  What Year? 0 points 0 points 0 points 0 points 0 points  What month? 0 points 0 points 0 points 0 points 0 points  What time? 0 points 0 points 0  points 0 points 0 points  Count back from 20 0 points 2 points 0 points 2 points 0 points  Months in reverse 0 points 0 points 0 points 0 points 0 points  Repeat phrase 2 points 2 points 8 points 0 points 0 points  Total Score 2 points 4 points 8 points 2 points 0 points    Immunizations Immunization History  Administered Date(s) Administered   Fluad Quad(high Dose 65+) 04/29/2019, 04/23/2022, 04/18/2023   Influenza,inj,quad, With Preservative 04/08/2017   Influenza-Unspecified 04/08/2013, 04/28/2018, 04/27/2020, 05/03/2021   Moderna Covid-19 Vaccine Bivalent Booster 29yrs & up 06/06/2021   Moderna Sars-Covid-2 Vaccination 07/20/2019, 08/17/2019, 05/14/2020   PFIZER Comirnaty(Gray Top)Covid-19 Tri-Sucrose Vaccine 10/20/2020   Pneumococcal Conjugate-13 04/28/2018   Pneumococcal Polysaccharide-23 08/15/2016   Tdap 08/15/2016   Zoster Recombinant(Shingrix) 10/30/2017, 12/25/2017    TDAP status: Up to  date  Flu Vaccine status: Up to date  Pneumococcal vaccine status: Up to date  Covid-19 vaccine status: Information provided on how to obtain vaccines.   Qualifies for Shingles Vaccine? Yes   Zostavax completed Yes   Shingrix Completed?: Yes  Screening Tests Health Maintenance  Topic Date Due   COVID-19 Vaccine (6 - 2023-24 season) 03/10/2023   HEMOGLOBIN A1C  08/31/2023   OPHTHALMOLOGY EXAM  10/04/2023   Diabetic kidney evaluation - Urine ACR  10/22/2023   FOOT EXAM  10/22/2023   MAMMOGRAM  02/06/2024   Diabetic kidney evaluation - eGFR measurement  03/17/2024   Medicare Annual Wellness (AWV)  05/21/2024   DTaP/Tdap/Td (2 - Td or Tdap) 08/15/2026   Pneumonia Vaccine 26+ Years old  Completed   INFLUENZA VACCINE  Completed   DEXA SCAN  Completed   Zoster Vaccines- Shingrix  Completed   HPV VACCINES  Aged Out    Health Maintenance  Health Maintenance Due  Topic Date Due   COVID-19 Vaccine (6 - 2023-24 season) 03/10/2023    Colorectal cancer screening: No longer  required.   Mammogram status: Completed 02/06/2023. Repeat every year  Bone Density status: Completed 12/20/2000.   Lung Cancer Screening: (Low Dose CT Chest recommended if Age 70-80 years, 20 pack-year currently smoking OR have quit w/in 15years.) does not qualify.   Lung Cancer Screening Referral: no  Additional Screening:  Hepatitis C Screening: does not qualify;   Vision Screening: Recommended annual ophthalmology exams for early detection of glaucoma and other disorders of the eye. Is the patient up to date with their annual eye exam?  Yes  Who is the provider or what is the name of the office in which the patient attends annual eye exams? Dr. Harlon Flor If pt is not established with a provider, would they like to be referred to a provider to establish care? No .   Dental Screening: Recommended annual dental exams for proper oral hygiene  Diabetic Foot Exam: Diabetic Foot Exam: Completed 10/22/2022  Community Resource Referral / Chronic Care Management: CRR required this visit?  No   CCM required this visit?  No     Plan:     I have personally reviewed and noted the following in the patient's chart:   Medical and social history Use of alcohol, tobacco or illicit drugs  Current medications and supplements including opioid prescriptions. Patient is not currently taking opioid prescriptions. Functional ability and status Nutritional status Physical activity Advanced directives List of other physicians Hospitalizations, surgeries, and ER visits in previous 12 months Vitals Screenings to include cognitive, depression, and falls Referrals and appointments  In addition, I have reviewed and discussed with patient certain preventive protocols, quality metrics, and best practice recommendations. A written personalized care plan for preventive services as well as general preventive health recommendations were provided to patient.     Barb Merino, LPN   [id number]    After Visit Summary: (In Person-Printed) AVS printed and given to the patient  Nurse Notes: none

## 2023-05-22 NOTE — Progress Notes (Signed)
I,[name], CMA,acting as a Neurosurgeon for Stephanie Aliment, MD.,have documented all relevant documentation on the behalf of Stephanie Aliment, MD,as directed by  Stephanie Aliment, MD while in the presence of Stephanie Aliment, MD.  Subjective:  Patient ID: [name] , female    DOB: 02/20/1941 , 82 y.o.   MRN: [id number]  Chief Complaint  Patient presents with   Diabetes   Hypertension    HPI  Patient presents today for a diabetes & bp check. She reports compliance with medications. Denies headache, chest pain, and SOB. She states she is going to [facility] for exercise. She has no specific concerns at this time.     She is also scheduled for AWV with [facility] Advisor.    Diabetes She presents for her follow-up diabetic visit. She has type 2 diabetes mellitus. Her disease course has been stable. There are no hypoglycemic associated symptoms. There are no diabetic associated symptoms. Pertinent negatives for diabetes include no blurred vision, no polydipsia, no polyphagia and no polyuria. There are no hypoglycemic complications. Risk factors for coronary artery disease include diabetes mellitus, dyslipidemia, hypertension, obesity, post-menopausal and sedentary lifestyle. She participates in exercise intermittently. Eye exam is current.  Hypertension This is a chronic problem. The current episode started more than 1 year ago. The problem has been gradually improving since onset. The problem is controlled. Pertinent negatives include no blurred vision. Agents associated with hypertension include thyroid hormones. Risk factors for coronary artery disease include dyslipidemia, obesity, post-menopausal state and sedentary lifestyle. Past treatments include angiotensin blockers. The current treatment provides moderate improvement. Compliance problems include exercise.      Past Medical History:  Diagnosis Date   Arthritis    RIGHT KNEE PAIN AND OA   Cataract    Chest pain 05/03/2015   Diabetes  mellitus without complication (HCC)    Elevated cholesterol    GERD (gastroesophageal reflux disease)    Glaucoma 2018   History of kidney stones    Hypertension    Hypothyroidism    IBS (irritable bowel syndrome)    Localized swelling of both lower legs    Rotator cuff disorder    BOTH SHOULDERS - NO SURGERY - AND STATES LIMITATIONS IN ARM MOVEMENT   Shortness of breath 05/03/2015   Swelling    CHRONIC SWELLING RIGHT HAND AND BOTH FEET AND BOTH LEGS   Vertigo      Family History  Problem Relation Age of Onset   Diabetes Mother    Dementia Mother    Diabetes Father    Diabetes Sister    Breast cancer Sister    Kidney cancer Sister    Cancer Sister    Lung cancer Sister    Lung cancer Maternal Aunt    Pancreatic cancer Maternal Aunt    Cancer Other    Cancer Sister    Diabetes Sister      Current Outpatient Medications:    albuterol (VENTOLIN HFA) 108 (90 Base) MCG/ACT inhaler, Inhale 1 puff into the lungs as needed., Disp: , Rfl:    aspirin EC 81 MG tablet, Take 81 mg by mouth daily. Swallow whole., Disp: , Rfl:    BENICAR 40 MG tablet, TAKE 1 TABLET BY MOUTH DAILY, Disp: 90 tablet, Rfl: 3   bimatoprost (LUMIGAN) 0.01 % SOLN, Place 1 drop into both eyes at bedtime. , Disp: , Rfl:    Blood Glucose Monitoring Suppl (ONETOUCH VERIO FLEX SYSTEM) w/Device KIT, USE TO CHECK BLOOD SUGAR  3  TIMES DAILY, Disp: 1 kit, Rfl: 1   Carboxymethylcellulose Sodium (REFRESH LIQUIGEL) 1 % GEL, Place 1 drop into both eyes at bedtime., Disp: , Rfl:    CRESTOR 20 MG tablet, TAKE 1 TABLET BY MOUTH AT  BEDTIME, Disp: 90 tablet, Rfl: 3   famotidine (PEPCID) 20 MG tablet, Take 20 mg by mouth daily with breakfast., Disp: , Rfl:    furosemide (LASIX) 20 MG tablet, TAKE 1 TABLET BY MOUTH IN THE  MORNING (Patient not taking: Reported on 05/22/2023), Disp: 90 tablet, Rfl: 3   Lancets (ONETOUCH DELICA PLUS LANCET33G) MISC, USE ONE LANCET 3 TIMES  DAILY BEFORE MEALS, Disp: 300 each, Rfl: 3   Lifitegrast  (XIIDRA) 5 % SOLN, Place 1 drop into both eyes daily. xiidra, Disp: , Rfl:    magnesium oxide (MAG-OX) 400 (240 Mg) MG tablet, TAKE 1 TABLET BY MOUTH EVERY DAY AFTER SUPPER, Disp: 90 tablet, Rfl: 1   metFORMIN (GLUCOPHAGE-XR) 500 MG 24 hr tablet, Take 1 tablet (500 mg total) by mouth 2 (two) times daily with a meal., Disp: 180 tablet, Rfl: 1   Multiple Vitamins-Minerals (MULTIVITAMIN WITH MINERALS) tablet, Take 1 tablet by mouth daily. Centrum Silver, Disp: , Rfl:    nystatin powder, Apply 1 Application topically 3 (three) times daily. (Patient not taking: Reported on 05/06/2023), Disp: 45 g, Rfl: 0   ONETOUCH VERIO test strip, USE AS INSTRUCTED TO CHECK BLOOD SUGAR 3 TIMES DAILY, Disp: 300 strip, Rfl: 3   polyethylene glycol (MIRALAX / GLYCOLAX) packet, Take 17 g by mouth daily., Disp: , Rfl:    spironolactone (ALDACTONE) 25 MG tablet, Take 1 tablet (25 mg total) by mouth every morning., Disp: 30 tablet, Rfl: 2   SYNTHROID 50 MCG tablet, TAKE 1 TABLET BY MOUTH DAILY, Disp: 90 tablet, Rfl: 3   traZODone (DESYREL) 50 MG tablet, Take 1 tablet (50 mg total) by mouth at bedtime. (Patient not taking: Reported on 05/06/2023), Disp: 30 tablet, Rfl: 1   triamcinolone cream (KENALOG) 0.1 %, APPLY TO AFFECTED AREA TWICE DAILY AS NEEDED, Disp: 45 g, Rfl: 0   vitamin B-6 (PYRIDOXINE) 25 MG tablet, Take 25 mg by mouth daily., Disp: , Rfl:    Allergies  Allergen Reactions   Codeine Nausea And Vomiting   Tramadol Nausea And Vomiting   Macrobid [Nitrofurantoin Macrocrystal] Rash     Review of Systems  Constitutional: Negative.   Eyes:  Negative for blurred vision.  Respiratory: Negative.    Cardiovascular: Negative.   Gastrointestinal: Negative.   Endocrine: Negative for polydipsia, polyphagia and polyuria.  Neurological: Negative.   Psychiatric/Behavioral: Negative.       Today's Vitals   05/22/23 1055  BP: 126/80  Pulse: 81  Temp: 98 F (36.7 C)  SpO2: 98%  Weight: 212 lb (96.2 kg)  Height:  5\' 3"  (1.6 m)   Body mass index is 37.55 kg/m.  Wt Readings from Last 3 Encounters:  05/22/23 212 lb (96.2 kg)  05/22/23 212 lb 6.4 oz (96.3 kg)  04/12/23 213 lb 9.6 oz (96.9 kg)    BP Readings from Last 3 Encounters:  05/22/23 126/80  05/22/23 128/60  04/12/23 (!) 144/60     Objective:  Physical Exam Vitals and nursing note reviewed.  Constitutional:      Appearance: Normal appearance. She is obese.  HENT:     Head: Normocephalic and atraumatic.  Eyes:     Extraocular Movements: Extraocular movements intact.  Cardiovascular:     Rate and Rhythm: Normal  rate and regular rhythm.     Heart sounds: Normal heart sounds.  Pulmonary:     Effort: Pulmonary effort is normal.     Breath sounds: Normal breath sounds.  Musculoskeletal:     Cervical back: Normal range of motion.  Skin:    General: Skin is warm.  Neurological:     General: No focal deficit present.     Mental Status: She is alert.  Psychiatric:        Mood and Affect: Mood normal.        Behavior: Behavior normal.        Assessment And Plan:  Dyslipidemia associated with type 2 diabetes mellitus (HCC) Assessment & Plan: Chronic, LDL goal is less than 70 due to DM and Aortic atherosclerosis. She will c/w Crestor 20mg  daily. She is currently taking metformin XR 500mg  twice daily for her diabetes. She is hesitant to try any new meds. Unfortunately, she did not tolerate Ozempic. Will consider Rybelsus in the future.   Orders: -     Hemoglobin A1c  Hypertensive heart disease without heart failure Assessment & Plan: Chronic, controlled.  She will continue with olmesartan 40mg  daily and spironolactone 25mg  daily. . Encouraged to follow low sodium diet.   Orders: -     BMP8+EGFR; Future  Aortic atherosclerosis (HCC) Assessment & Plan: Chronic, LDL goal <70. She will c/w ASA 81mg  and rosuvastatin 20mg  daily.   Gastroesophageal reflux disease without esophagitis Assessment & Plan: She is reminded to stop  eating 3 hours prior to lying down and to avoid known triggers. She is reminded to continue with famotidine daily. May need to switch to PPI therapy if her symptoms worsen.    Class 2 severe obesity due to excess calories with serious comorbidity and body mass index (BMI) of 37.0 to 37.9 in adult [facility]) Assessment & Plan: She is encouraged to strive for BMI less than 30 to decrease cardiac risk. Advised to aim for at least 150 minutes of exercise per week.        Return in about 8 weeks (around 07/17/2023), or bp check  -[place].  Patient was given opportunity to ask questions. Patient verbalized understanding of the plan and was able to repeat key elements of the plan. All questions were answered to their satisfaction.    I, Stephanie Aliment, MD, have reviewed all documentation for this visit. The documentation on 05/22/23 for the exam, diagnosis, procedures, and orders are all accurate and complete.   IF YOU HAVE BEEN REFERRED TO A SPECIALIST, IT MAY TAKE 1-2 WEEKS TO SCHEDULE/PROCESS THE REFERRAL. IF YOU HAVE NOT HEARD FROM US/SPECIALIST IN TWO WEEKS, PLEASE GIVE [place] A CALL AT [phone].   THE PATIENT IS ENCOURAGED TO PRACTICE SOCIAL DISTANCING DUE TO THE COVID-19 PANDEMIC.

## 2023-05-22 NOTE — Patient Instructions (Signed)
Hypertension, Adult Hypertension is another name for high blood pressure. High blood pressure forces your heart to work harder to pump blood. This can cause problems over time. There are two numbers in a blood pressure reading. There is a top number (systolic) over a bottom number (diastolic). It is best to have a blood pressure that is below 120/80. What are the causes? The cause of this condition is not known. Some other conditions can lead to high blood pressure. What increases the risk? Some lifestyle factors can make you more likely to develop high blood pressure: Smoking. Not getting enough exercise or physical activity. Being overweight. Having too much fat, sugar, calories, or salt (sodium) in your diet. Drinking too much alcohol. Other risk factors include: Having any of these conditions: Heart disease. Diabetes. High cholesterol. Kidney disease. Obstructive sleep apnea. Having a family history of high blood pressure and high cholesterol. Age. The risk increases with age. Stress. What are the signs or symptoms? High blood pressure may not cause symptoms. Very high blood pressure (hypertensive crisis) may cause: Headache. Fast or uneven heartbeats (palpitations). Shortness of breath. Nosebleed. Vomiting or feeling like you may vomit (nauseous). Changes in how you see. Very bad chest pain. Feeling dizzy. Seizures. How is this treated? This condition is treated by making healthy lifestyle changes, such as: Eating healthy foods. Exercising more. Drinking less alcohol. Your doctor may prescribe medicine if lifestyle changes do not help enough and if: Your top number is above 130. Your bottom number is above 80. Your personal target blood pressure may vary. Follow these instructions at home: Eating and drinking  If told, follow the DASH eating plan. To follow this plan: Fill one half of your plate at each meal with fruits and vegetables. Fill one fourth of your plate  at each meal with whole grains. Whole grains include whole-wheat pasta, brown rice, and whole-grain bread. Eat or drink low-fat dairy products, such as skim milk or low-fat yogurt. Fill one fourth of your plate at each meal with low-fat (lean) proteins. Low-fat proteins include fish, chicken without skin, eggs, beans, and tofu. Avoid fatty meat, cured and processed meat, or chicken with skin. Avoid pre-made or processed food. Limit the amount of salt in your diet to less than 1,500 mg each day. Do not drink alcohol if: Your doctor tells you not to drink. You are pregnant, may be pregnant, or are planning to become pregnant. If you drink alcohol: Limit how much you have to: 0-1 drink a day for women. 0-2 drinks a day for men. Know how much alcohol is in your drink. In the [place] one drink equals one 12 oz bottle of beer (355 mL), one 5 oz glass of wine (148 mL), or one 1 oz glass of hard liquor (44 mL). Lifestyle  Work with your doctor to stay at a healthy weight or to lose weight. Ask your doctor what the best weight is for you. Get at least 30 minutes of exercise that causes your heart to beat faster (aerobic exercise) most days of the week. This may include walking, swimming, or biking. Get at least 30 minutes of exercise that strengthens your muscles (resistance exercise) at least 3 days a week. This may include lifting weights or doing Pilates. Do not smoke or use any products that contain nicotine or tobacco. If you need help quitting, ask your doctor. Check your blood pressure at home as told by your doctor. Keep all follow-up visits. Medicines Take over-the-counter and prescription medicines   only as told by your doctor. Follow directions carefully. Do not skip doses of blood pressure medicine. The medicine does not work as well if you skip doses. Skipping doses also puts you at risk for problems. Ask your doctor about side effects or reactions to medicines that you should watch  for. Contact a doctor if: You think you are having a reaction to the medicine you are taking. You have headaches that keep coming back. You feel dizzy. You have swelling in your ankles. You have trouble with your vision. Get help right away if: You get a very bad headache. You start to feel mixed up (confused). You feel weak or numb. You feel faint. You have very bad pain in your: Chest. Belly (abdomen). You vomit more than once. You have trouble breathing. These symptoms may be an emergency. Get help right away. Call 911. Do not wait to see if the symptoms will go away. Do not drive yourself to the hospital. Summary Hypertension is another name for high blood pressure. High blood pressure forces your heart to work harder to pump blood. For most people, a normal blood pressure is less than 120/80. Making healthy choices can help lower blood pressure. If your blood pressure does not get lower with healthy choices, you may need to take medicine. This information is not intended to replace advice given to you by your health care provider. Make sure you discuss any questions you have with your health care provider. Document Revised: 04/13/2021 Document Reviewed: 04/13/2021 Elsevier Patient Education  2024 [employer] Inc.  

## 2023-05-22 NOTE — Patient Instructions (Signed)
[name] , Thank you for taking time to come for your Medicare Wellness Visit. I appreciate your ongoing commitment to your health goals. Please review the following plan we discussed and let me know if I can assist you in the future.   Referrals/Orders/Follow-Ups/Clinician Recommendations: none  This is a list of the screening recommended for you and due dates:  Health Maintenance  Topic Date Due   COVID-19 Vaccine (6 - 2023-24 season) 03/10/2023   Hemoglobin A1C  08/31/2023   Eye exam for diabetics  10/04/2023   Yearly kidney health urinalysis for diabetes  10/22/2023   Complete foot exam   10/22/2023   Mammogram  02/06/2024   Yearly kidney function blood test for diabetes  03/17/2024   Medicare Annual Wellness Visit  05/21/2024   DTaP/Tdap/Td vaccine (2 - Td or Tdap) 08/15/2026   Pneumonia Vaccine  Completed   Flu Shot  Completed   DEXA scan (bone density measurement)  Completed   Zoster (Shingles) Vaccine  Completed   HPV Vaccine  Aged Out    Advanced directives: (In Chart) A copy of your advanced directives are scanned into your chart should your provider ever need it.  Next Medicare Annual Wellness Visit scheduled for next year: No, office will schedule appointment  Insert Preventive Care attachment Insert FALL PREVENTION attachment if needed

## 2023-06-01 NOTE — Assessment & Plan Note (Signed)
Chronic, LDL goal is less than 70 due to DM and Aortic atherosclerosis. She will c/w Crestor 20mg  daily. She is currently taking metformin XR 500mg  twice daily for her diabetes. She is hesitant to try any new meds. Unfortunately, she did not tolerate Ozempic. Will consider Rybelsus in the future.

## 2023-06-01 NOTE — Assessment & Plan Note (Signed)
She is encouraged to strive for BMI less than 30 to decrease cardiac risk. Advised to aim for at least 150 minutes of exercise per week.

## 2023-06-01 NOTE — Assessment & Plan Note (Signed)
She is reminded to stop eating 3 hours prior to lying down and to avoid known triggers. She is reminded to continue with famotidine daily. May need to switch to PPI therapy if her symptoms worsen.

## 2023-06-01 NOTE — Assessment & Plan Note (Signed)
Chronic, controlled.  She will continue with olmesartan 40mg  daily and spironolactone 25mg  daily. . Encouraged to follow low sodium diet.

## 2023-06-01 NOTE — Assessment & Plan Note (Signed)
Chronic, LDL goal <70. She will c/w ASA 81mg  and rosuvastatin 20mg  daily.

## 2023-06-03 ENCOUNTER — Other Ambulatory Visit: Payer: Self-pay | Admitting: Internal Medicine

## 2023-06-04 LAB — BMP8+EGFR
BUN/Creatinine Ratio: 21 (ref 12–28)
BUN: 15 mg/dL (ref 8–27)
CO2: 24 mmol/L (ref 20–29)
Calcium: 9.6 mg/dL (ref 8.7–10.3)
Chloride: 108 mmol/L — ABNORMAL HIGH (ref 96–106)
Creatinine, Ser: 0.7 mg/dL (ref 0.57–1.00)
Glucose: 114 mg/dL — ABNORMAL HIGH (ref 70–99)
Potassium: 4.6 mmol/L (ref 3.5–5.2)
Sodium: 144 mmol/L (ref 134–144)
eGFR: 85 mL/min/{1.73_m2} (ref >59)

## 2023-06-17 ENCOUNTER — Other Ambulatory Visit: Payer: Self-pay | Admitting: Internal Medicine

## 2023-07-05 ENCOUNTER — Encounter: Payer: Self-pay | Admitting: Pharmacist

## 2023-07-16 ENCOUNTER — Ambulatory Visit: Payer: Medicare Other

## 2023-07-16 ENCOUNTER — Ambulatory Visit (INDEPENDENT_AMBULATORY_CARE_PROVIDER_SITE_OTHER): Payer: Medicare Other | Admitting: Nurse Practitioner

## 2023-07-16 VITALS — BP 112/60 | HR 82 | Temp 97.4°F | Ht 63.0 in | Wt 211.6 lb

## 2023-07-16 DIAGNOSIS — R42 Dizziness and giddiness: Secondary | ICD-10-CM

## 2023-07-16 DIAGNOSIS — I119 Hypertensive heart disease without heart failure: Secondary | ICD-10-CM | POA: Diagnosis not present

## 2023-07-16 DIAGNOSIS — E66812 Obesity, class 2: Secondary | ICD-10-CM | POA: Diagnosis not present

## 2023-07-16 DIAGNOSIS — M25551 Pain in right hip: Secondary | ICD-10-CM

## 2023-07-16 DIAGNOSIS — Z6837 Body mass index (BMI) 37.0-37.9, adult: Secondary | ICD-10-CM

## 2023-07-16 NOTE — Progress Notes (Signed)
 I,Jameka J Llittleton, CMA,acting as a neurosurgeon for [employer], FNP.,have documented all relevant documentation on the behalf of [name], FNP,as directed by  [name], FNP while in the presence of [name], FNP.  Subjective:  Patient ID: [name] , female    DOB: 07/26/1940 , 83 y.o.   MRN: [id number]  Chief Complaint  Patient presents with   Dizziness    HPI  Patient presents today for dizziness for the past week. She reports the dizziness got worse yesterday. She reports she is dizzy and nauseous in the mornings when she wakes up. She has been treated for positional vertigo in the past. She feels like she is going around in circles and unable to focus. She has not been drinking as much as she should of water. Yesterday she felt sick on her stomach but improved after dizziness passed. When she laid back today she got dizzy.  She also complains of right hip pain dull. After plugging a plug in initially felt in her back.   Blood sugar was 133 this morning.   Dizziness Associated symptoms include arthralgias (right hip pain) and nausea. Pertinent negatives include no congestion, coughing, fatigue, fever, headaches, vertigo or vomiting. Exacerbated by: changing positions. She has tried position changes for the symptoms.     Past Medical History:  Diagnosis Date   Arthritis    RIGHT KNEE PAIN AND OA   Cataract    Chest pain 05/03/2015   Diabetes mellitus without complication (HCC)    Elevated cholesterol    GERD (gastroesophageal reflux disease)    Glaucoma 2018   History of kidney stones    Hypertension    Hypothyroidism    IBS (irritable bowel syndrome)    Localized swelling of both lower legs    Rotator cuff disorder    BOTH SHOULDERS - NO SURGERY - AND STATES LIMITATIONS IN ARM MOVEMENT   Shortness of breath 05/03/2015   Swelling    CHRONIC SWELLING RIGHT HAND AND BOTH FEET AND BOTH LEGS   Vertigo      Family History  Problem Relation Age of Onset    Diabetes Mother    Dementia Mother    Diabetes Father    Diabetes Sister    Breast cancer Sister    Kidney cancer Sister    Cancer Sister    Lung cancer Sister    Lung cancer Maternal Aunt    Pancreatic cancer Maternal Aunt    Cancer Other    Cancer Sister    Diabetes Sister      Current Outpatient Medications:    albuterol (VENTOLIN HFA) 108 (90 Base) MCG/ACT inhaler, Inhale 1 puff into the lungs as needed., Disp: , Rfl:    aspirin EC 81 MG tablet, Take 81 mg by mouth daily. Swallow whole., Disp: , Rfl:    BENICAR  40 MG tablet, TAKE 1 TABLET BY MOUTH DAILY, Disp: 90 tablet, Rfl: 3   bimatoprost (LUMIGAN) 0.01 % SOLN, Place 1 drop into both eyes at bedtime. , Disp: , Rfl:    Blood Glucose Monitoring Suppl (ONETOUCH VERIO FLEX SYSTEM) w/Device KIT, USE TO CHECK BLOOD SUGAR 3  TIMES DAILY, Disp: 1 kit, Rfl: 1   Carboxymethylcellulose Sodium (REFRESH LIQUIGEL) 1 % GEL, Place 1 drop into both eyes at bedtime., Disp: , Rfl:    CRESTOR  20 MG tablet, TAKE 1 TABLET BY MOUTH AT  BEDTIME, Disp: 90 tablet, Rfl: 3   famotidine (PEPCID) 20 MG tablet, Take 20 mg by  mouth daily with breakfast., Disp: , Rfl:    furosemide  (LASIX ) 20 MG tablet, TAKE 1 TABLET BY MOUTH IN THE  MORNING, Disp: 90 tablet, Rfl: 3   Lancets (ONETOUCH DELICA PLUS LANCET33G) MISC, USE ONE LANCET 3 TIMES  DAILY BEFORE MEALS, Disp: 300 each, Rfl: 3   Lifitegrast (XIIDRA) 5 % SOLN, Place 1 drop into both eyes daily. xiidra, Disp: , Rfl:    magnesium  oxide (MAG-OX) 400 (240 Mg) MG tablet, TAKE 1 TABLET BY MOUTH EVERY DAY AFTER SUPPER, Disp: 90 tablet, Rfl: 1   metFORMIN  (GLUCOPHAGE -XR) 500 MG 24 hr tablet, TAKE 1 TABLET BY MOUTH TWICE  DAILY WITH A MEAL, Disp: 180 tablet, Rfl: 3   Multiple Vitamins-Minerals (MULTIVITAMIN WITH MINERALS) tablet, Take 1 tablet by mouth daily. Centrum Silver, Disp: , Rfl:    nystatin  powder, Apply 1 Application topically 3 (three) times daily., Disp: 45 g, Rfl: 0   ONETOUCH VERIO test strip, USE AS  INSTRUCTED TO CHECK BLOOD SUGAR 3 TIMES DAILY, Disp: 300 strip, Rfl: 3   polyethylene glycol (MIRALAX  / GLYCOLAX ) packet, Take 17 g by mouth daily., Disp: , Rfl:    SYNTHROID  50 MCG tablet, TAKE 1 TABLET BY MOUTH DAILY, Disp: 90 tablet, Rfl: 3   traZODone  (DESYREL ) 50 MG tablet, Take 1 tablet (50 mg total) by mouth at bedtime., Disp: 30 tablet, Rfl: 1   triamcinolone  cream (KENALOG ) 0.1 %, APPLY TO AFFECTED AREA TWICE DAILY AS NEEDED, Disp: 45 g, Rfl: 0   vitamin B-6 (PYRIDOXINE) 25 MG tablet, Take 25 mg by mouth daily., Disp: , Rfl:    spironolactone  (ALDACTONE ) 25 MG tablet, Take 1 tablet (25 mg total) by mouth every morning., Disp: 30 tablet, Rfl: 2   Allergies  Allergen Reactions   Codeine Nausea And Vomiting   Tramadol Nausea And Vomiting   Macrobid [Nitrofurantoin Macrocrystal] Rash     Review of Systems  Constitutional:  Negative for fatigue and fever.  HENT:  Negative for congestion.   Respiratory:  Negative for cough.   Gastrointestinal:  Positive for nausea. Negative for vomiting.  Musculoskeletal:  Positive for arthralgias (right hip pain).  Neurological:  Positive for dizziness. Negative for vertigo and headaches.  Psychiatric/Behavioral: Negative.       Today's Vitals   07/16/23 1436 07/16/23 1441 07/16/23 1442  BP:  108/60 112/60  Pulse:  78 82  Temp: (!) 97.4 F (36.3 C)    TempSrc: Oral    Weight: 211 lb 9.6 oz (96 kg)    Height: 5' 3 (1.6 m)    PainSc: 7     PainLoc: Hip     Body mass index is 37.48 kg/m.  Wt Readings from Last 3 Encounters:  07/23/23 211 lb (95.7 kg)  07/16/23 211 lb 9.6 oz (96 kg)  05/22/23 212 lb (96.2 kg)    Objective:  Physical Exam Vitals reviewed.  Constitutional:      General: She is not in acute distress.    Appearance: Normal appearance. She is obese.  Cardiovascular:     Rate and Rhythm: Normal rate and regular rhythm.     Pulses: Normal pulses.     Heart sounds: Normal heart sounds. No murmur heard. Pulmonary:      Effort: Pulmonary effort is normal. No respiratory distress.     Breath sounds: Normal breath sounds. No wheezing.  Musculoskeletal:        General: Tenderness present. No swelling.  Skin:    General: Skin is warm and dry.  Capillary Refill: Capillary refill takes less than 2 seconds.  Neurological:     General: No focal deficit present.     Mental Status: She is alert and oriented to person, place, and time.     Cranial Nerves: No cranial nerve deficit.     Motor: No weakness.         Assessment And Plan:  Dizziness Assessment & Plan: Orthostats are normal, I have encouraged her to increase her water intake.  I will check her iron and hgb levels.   Orders: -     BMP8+eGFR -     CBC with Differential/Platelet -     Iron, TIBC and Ferritin Panel -     TSH + free T4  Class 2 severe obesity due to excess calories with serious comorbidity and body mass index (BMI) of 37.0 to 37.9 in adult [facility]) Assessment & Plan: She is encouraged to strive for BMI less than 30 to decrease cardiac risk. Advised to aim for at least 150 minutes of exercise per week.    Hypertensive heart disease without heart failure Assessment & Plan: Blood pressure is well controlled, continue current medications  Orders: -     TSH + free T4  Right hip pain Assessment & Plan: Tenderness on movement, she has a slightly slow gait. She is to take tyelenol twice a day scheduled if not better return call to office to possibly get a toradol  injection. This may also be muscular in nature.      Return if symptoms worsen or fail to improve.  Patient was given opportunity to ask questions. Patient verbalized understanding of the plan and was able to repeat key elements of the plan. All questions were answered to their satisfaction.    LILLETTE [name], FNP, have reviewed all documentation for this visit. The documentation on 07/16/23 for the exam, diagnosis, procedures, and orders are all accurate and complete.    IF YOU HAVE BEEN REFERRED TO A SPECIALIST, IT MAY TAKE 1-2 WEEKS TO SCHEDULE/PROCESS THE REFERRAL. IF YOU HAVE NOT HEARD FROM US /SPECIALIST IN TWO WEEKS, PLEASE GIVE US  A CALL AT [phone].

## 2023-07-17 ENCOUNTER — Telehealth: Payer: Self-pay

## 2023-07-17 ENCOUNTER — Other Ambulatory Visit: Payer: Self-pay | Admitting: Nurse Practitioner

## 2023-07-17 DIAGNOSIS — R42 Dizziness and giddiness: Secondary | ICD-10-CM

## 2023-07-17 LAB — CBC WITH DIFFERENTIAL/PLATELET
Basophils Absolute: 0 10*3/uL (ref 0.0–0.2)
Basos: 1 %
EOS (ABSOLUTE): 0.1 10*3/uL (ref 0.0–0.4)
Eos: 2 %
Hematocrit: 45.2 % (ref 34.0–46.6)
Hemoglobin: 14.8 g/dL (ref 11.1–15.9)
Immature Grans (Abs): 0 10*3/uL (ref 0.0–0.1)
Immature Granulocytes: 0 %
Lymphocytes Absolute: 2.4 10*3/uL (ref 0.7–3.1)
Lymphs: 51 %
MCH: 30.5 pg (ref 26.6–33.0)
MCHC: 32.7 g/dL (ref 31.5–35.7)
MCV: 93 fL (ref 79–97)
Monocytes Absolute: 0.4 10*3/uL (ref 0.1–0.9)
Monocytes: 9 %
Neutrophils Absolute: 1.8 10*3/uL (ref 1.4–7.0)
Neutrophils: 37 %
Platelets: 179 10*3/uL (ref 150–450)
RBC: 4.85 x10E6/uL (ref 3.77–5.28)
RDW: 13.1 % (ref 11.7–15.4)
WBC: 4.7 10*3/uL (ref 3.4–10.8)

## 2023-07-17 LAB — BMP8+EGFR
BUN/Creatinine Ratio: 25 (ref 12–28)
BUN: 20 mg/dL (ref 8–27)
CO2: 27 mmol/L (ref 20–29)
Calcium: 9.5 mg/dL (ref 8.7–10.3)
Chloride: 103 mmol/L (ref 96–106)
Creatinine, Ser: 0.8 mg/dL (ref 0.57–1.00)
Glucose: 128 mg/dL — ABNORMAL HIGH (ref 70–99)
Potassium: 4.4 mmol/L (ref 3.5–5.2)
Sodium: 141 mmol/L (ref 134–144)
eGFR: 73 mL/min/{1.73_m2} (ref >59)

## 2023-07-17 LAB — IRON,TIBC AND FERRITIN PANEL
Ferritin: 381 ng/mL — ABNORMAL HIGH (ref 15–150)
Iron Saturation: 33 % (ref 15–55)
Iron: 94 ug/dL (ref 27–139)
Total Iron Binding Capacity: 283 ug/dL (ref 250–450)
UIBC: 189 ug/dL (ref 118–369)

## 2023-07-17 LAB — TSH+FREE T4
Free T4: 1.43 ng/dL (ref 0.82–1.77)
TSH: 1.05 u[IU]/mL (ref 0.450–4.500)

## 2023-07-17 NOTE — Telephone Encounter (Signed)
 Patient was called to see how she was doing after yesterdays visit. Also wanted to inform patient that a CT of her head was ordered.

## 2023-07-22 ENCOUNTER — Other Ambulatory Visit: Payer: Self-pay

## 2023-07-23 ENCOUNTER — Ambulatory Visit (HOSPITAL_BASED_OUTPATIENT_CLINIC_OR_DEPARTMENT_OTHER)
Admission: RE | Admit: 2023-07-23 | Discharge: 2023-07-23 | Disposition: A | Discharge status: Home / Self Care | Payer: Medicare Other | Source: Ambulatory Visit | Attending: Nurse Practitioner | Admitting: Nurse Practitioner

## 2023-07-23 ENCOUNTER — Ambulatory Visit (INDEPENDENT_AMBULATORY_CARE_PROVIDER_SITE_OTHER): Payer: Medicare Other

## 2023-07-23 VITALS — BP 110/60 | HR 78 | Temp 97.8°F | Ht 63.0 in | Wt 211.0 lb

## 2023-07-23 DIAGNOSIS — M25551 Pain in right hip: Secondary | ICD-10-CM | POA: Diagnosis not present

## 2023-07-23 DIAGNOSIS — R42 Dizziness and giddiness: Secondary | ICD-10-CM | POA: Diagnosis present

## 2023-07-23 MED ORDER — KETOROLAC TROMETHAMINE 60 MG/2ML IM SOLN
30.0000 mg | Freq: Once | INTRAMUSCULAR | Status: AC
Start: 2023-07-23 — End: 2023-07-23
  Administered 2023-07-23: 30 mg via INTRAMUSCULAR

## 2023-07-23 NOTE — Progress Notes (Signed)
 Patient presents today for a toradol injection for her hip pain. YL,RMA

## 2023-07-24 ENCOUNTER — Telehealth: Payer: Self-pay

## 2023-07-24 NOTE — Telephone Encounter (Signed)
 Patient called and stated that her hip pain was back and worse. Patient was encouraged to [facility] to [facility]. Patient stated she would try to find someone to take her.

## 2023-07-28 ENCOUNTER — Encounter: Payer: Self-pay | Admitting: Nurse Practitioner

## 2023-07-28 DIAGNOSIS — R42 Dizziness and giddiness: Secondary | ICD-10-CM | POA: Insufficient documentation

## 2023-07-28 DIAGNOSIS — M25551 Pain in right hip: Secondary | ICD-10-CM | POA: Insufficient documentation

## 2023-07-28 NOTE — Assessment & Plan Note (Signed)
Orthostats are normal, I have encouraged her to increase her water intake.  I will check her iron and hgb levels.

## 2023-07-28 NOTE — Assessment & Plan Note (Signed)
Blood pressure is well controlled, continue current medications.

## 2023-07-28 NOTE — Assessment & Plan Note (Signed)
Tenderness on movement, she has a slightly slow gait. She is to take tyelenol twice a day scheduled if not better return call to office to possibly get a toradol injection. This may also be muscular in nature.

## 2023-07-28 NOTE — Assessment & Plan Note (Signed)
 She is encouraged to strive for BMI less than 30 to decrease cardiac risk. Advised to aim for at least 150 minutes of exercise per week.

## 2023-08-01 ENCOUNTER — Other Ambulatory Visit: Payer: Self-pay | Admitting: Nurse Practitioner

## 2023-08-01 DIAGNOSIS — R42 Dizziness and giddiness: Secondary | ICD-10-CM

## 2023-08-13 ENCOUNTER — Ambulatory Visit: Payer: Medicare Other | Attending: Nurse Practitioner

## 2023-08-13 ENCOUNTER — Other Ambulatory Visit: Payer: Self-pay

## 2023-08-13 DIAGNOSIS — R2689 Other abnormalities of gait and mobility: Secondary | ICD-10-CM | POA: Insufficient documentation

## 2023-08-13 DIAGNOSIS — H8111 Benign paroxysmal vertigo, right ear: Secondary | ICD-10-CM | POA: Diagnosis present

## 2023-08-13 DIAGNOSIS — R42 Dizziness and giddiness: Secondary | ICD-10-CM | POA: Diagnosis not present

## 2023-08-13 DIAGNOSIS — R262 Difficulty in walking, not elsewhere classified: Secondary | ICD-10-CM | POA: Diagnosis present

## 2023-08-13 NOTE — Therapy (Signed)
 OUTPATIENT PHYSICAL THERAPY VESTIBULAR EVALUATION     Patient Name: [name] MRN: [id number] DOB:1940/11/08, 83 y.o., female Today's Date: 08/13/2023  END OF SESSION:  PT End of Session - 08/13/23 1712     Visit Number 1    Date for PT Re-Evaluation 10/08/23    Progress Note Due on Visit 10    PT Start Time 1445    PT Stop Time 1530    PT Time Calculation (min) 45 min    Activity Tolerance Patient tolerated treatment well    Behavior During Therapy [facility] for tasks assessed/performed             Past Medical History:  Diagnosis Date   Arthritis    RIGHT KNEE PAIN AND OA   Cataract    Chest pain 05/03/2015   Diabetes mellitus without complication (HCC)    Elevated cholesterol    GERD (gastroesophageal reflux disease)    Glaucoma 2018   History of kidney stones    Hypertension    Hypothyroidism    IBS (irritable bowel syndrome)    Localized swelling of both lower legs    Rotator cuff disorder    BOTH SHOULDERS - NO SURGERY - AND STATES LIMITATIONS IN ARM MOVEMENT   Shortness of breath 05/03/2015   Swelling    CHRONIC SWELLING RIGHT HAND AND BOTH FEET AND BOTH LEGS   Vertigo    Past Surgical History:  Procedure Laterality Date   ABDOMINAL HYSTERECTOMY  1985   BREAST SURGERY  1985   RIGHT BREAST BIOPSY - BENIGN   CARPAL TUNNEL RELEASE Left 02/05/2019   Procedure: LEFT CARPAL TUNNEL RELEASE;  Surgeon: Murrell Kuba, MD;  Location: [facility];  Service: Orthopedics;  Laterality: Left;   CARPAL TUNNEL RELEASE Right 07/25/2021   Procedure: RIGHT CARPAL TUNNEL RELEASE;  Surgeon: Murrell Kuba, MD;  Location: [facility];  Service: Orthopedics;  Laterality: Right;  Bier block   CHOLECYSTECTOMY  1975   EYE SURGERY  1995   BILATERAL CATARACT EXTRACTIONS; SURGERY FOR MACULAR HOLE    HEMORRHOID SURGERY  1975   JOINT REPLACEMENT     PARATHYROIDECTOMY Right 04/12/2022   Procedure: RIGHT INFERIOR PARATHYROIDECTOMY;  Surgeon: Eletha Boas, MD;   Location: [facility];  Service: General;  Laterality: Right;   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   Procedure: RIGHT TOTAL KNEE ARTHROPLASTY;  Surgeon: Dempsey LULLA Moan, MD;  Location: [facility];  Service: Orthopedics;  Laterality: Right;   Patient Active Problem List   Diagnosis Date Noted   Dizziness 07/28/2023   Right hip pain 07/28/2023   Hypertensive heart disease without heart failure 05/22/2023   Gastroesophageal reflux disease without esophagitis 03/31/2023   Primary insomnia 03/11/2023   Aortic atherosclerosis (HCC) 07/21/2022   Claudication (HCC) 06/26/2022   Dermatitis 05/06/2022   Erythrocytosis 05/06/2022   Swelling of lower extremity 05/06/2022   Dyslipidemia associated with type 2 diabetes mellitus (HCC) 10/28/2021   Multiple lung nodules 10/28/2021   Hypercalcemia 10/28/2021   Class 2 severe obesity due to excess calories with serious comorbidity and body mass index (BMI) of 37.0 to 37.9 in adult [facility]) 07/13/2020   Primary hypothyroidism 04/06/2018   Hyperlipidemia 04/06/2018   Essential hypertension 04/06/2018   Abnormal glucose 04/06/2018   Chest pain 05/03/2015   Shortness of breath 05/03/2015   Dizziness and giddiness 05/18/2014   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  It hasn't really completely improved since I saw my PCP.  I notice it when I lie down at night. I feel sort of an underlying dizziness all the time  Pt accompanied by:  self  PERTINENT HISTORY: referred by PCP for vertigo eval  PAIN:  Are you having pain? L post hip, to see Md about next week  PRECAUTIONS: Fall  RED FLAGS: None   WEIGHT BEARING RESTRICTIONS: No  FALLS: Has patient fallen in last 6 months? No  LIVING ENVIRONMENT: Lives with: lives alone Lives in: House/apartment Stairs: No Has following equipment at home: Single point cane and Environmental Consultant - 2 wheeled  PLOF: Independent  PATIENT GOALS: get rid of dizziness  OBJECTIVE: CT results forwarded:  Note: Objective measures were completed at Evaluation unless otherwise noted.  DIAGNOSTIC FINDINGS: No acute intracranial process. No etiology is seen for the patient's symptoms.     Electronically Signed   By: Donald Campion M.D.   On: 07/31/2023 20:14  COGNITION: Overall cognitive status: Within functional limits for tasks assessed   SENSATION: WFL  EDEMA:  na  POSTURE:  rounded shoulders and forward head  Cervical ROM:    Active A/PROM (deg) eval  Flexion wfl  Extension 80  Right lateral flexion   Left lateral flexion   Right rotation 75  Left rotation 85  (Blank rows = not tested)  STRENGTH: wfl UE's  BED MOBILITY:  I all bed mobility, but dizzy once sitting upright       GAIT: Gait pattern: decreased arm swing- Right, decreased arm swing- Left, decreased step length- Right, decreased step length- Left, decreased stance time- Right, decreased stance time- Left, decreased stride length, shuffling, and trunk flexed Distance walked: 73 ' in clinic Assistive device utilized: None Level of assistance: Modified independence Comments: maintains rigid trunk, maintains stiff neck, looks forward   FUNCTIONAL TESTS:  Functional gait assessment: TBD  PATIENT SURVEYS:  DHI 30%or 30/100  VESTIBULAR ASSESSMENT:  GENERAL OBSERVATION: no head movement with conversation or with gait initially   SYMPTOM BEHAVIOR:  Subjective history: sudden onset dizziness, saw  PCP, referred for BPPV assessment by PT, initial severity of Sx has improved but still lingering dizziness and notices when lying down in bed .occurs lying down on R side   Non-Vestibular symptoms: nausea/vomiting  Type of dizziness: Spinning/Vertigo and Funny feeling in the head  Frequency: daily, nightly  Duration: several mins  Aggravating factors: Induced by position change: lying supine  Relieving factors: head stationary  Progression of symptoms: better  OCULOMOTOR EXAM:  Ocular Alignment: normal  Ocular ROM: No Limitations  Spontaneous Nystagmus: absent  Gaze-Induced Nystagmus: absent  Smooth Pursuits: intact  Saccades: intact  Convergence/Divergence: 1 cm    VESTIBULAR - OCULAR REFLEX:   Slow VOR: Normal  VOR Cancellation: Unable to Maintain Gaze  Head-Impulse Test: HIT Right: positive  Dynamic Visual Acuity: Not able to be assessed   POSITIONAL TESTING: Right Dix-Hallpike: upbeating, right nystagmus Right Roll Test: no nystagmus Left Roll Test: no nystagmus  MOTION SENSITIVITY:  Motion Sensitivity Quotient Intensity: 0 = none, 1 = Lightheaded, 2 = Mild,  3 = Moderate, 4 = Severe, 5 = Vomiting  Intensity  1. Sitting to supine   2. Supine to L side   3. Supine to R side   4. Supine to sitting   5. L Hallpike-Dix   6. Up from L    7. R Hallpike-Dix   8. Up from R    9. Sitting, head tipped to L knee   10. Head up from L knee   11. Sitting, head tipped to R knee   12. Head up from R knee   13. Sitting head turns x5   14.Sitting head nods x5   15. In stance, 180 turn to L    16. In stance, 180 turn to R     OTHOSTATICS: not done  FUNCTIONAL GAIT: Functional gait assessment: TBD                                                                                                                             TREATMENT DATE: 08/13/23:    Canalith Repositioning:  Epley Right: Number of Reps: 3, improved Sx progressive   PATIENT EDUCATION: Education details:  POC, goals Person educated: Patient Education method: Explanation, Demonstration, Tactile cues, and Verbal cues Education comprehension: verbalized understanding, returned demonstration, and verbal cues required  HOME EXERCISE PROGRAM:  GOALS: Goals reviewed with patient? Yes  SHORT TERM GOALS: Target date: 2 weeks   I home management of vertigo Sx Baseline: Goal status: INITIAL   LONG TERM GOALS: Target date: 8 weeks to 10/08/23  DHI improve from 30 % deficit to 10% deficit Baseline:  Goal status: INITIAL  2.  DHI score 27/30 Baseline: TBD Goal status: INITIAL  3.  Resolution of BPPV with positional testing Baseline:  Goal status: INITIAL    ASSESSMENT:  CLINICAL IMPRESSION: Patient is a 83 y.o. female who was evaluated today by physical therapy  for vertigo.  She was consistent both with her history and with positional testing for R post canalithiasis.  Treated with 3 bouts of Epley.  She did experience some nausea with Rx but on 3rd retest has only a brief bout of nystagmus and dizziness, able to move head to reach for coat, walk out of department easily.   OBJECTIVE IMPAIRMENTS: decreased activity tolerance, decreased balance, dizziness, and impaired perceived functional ability.   ACTIVITY LIMITATIONS: bending, sleeping, bed mobility, and locomotion level  PARTICIPATION LIMITATIONS: driving, community activity, and yard work  PERSONAL FACTORS: Age, Behavior pattern, and 1-2 comorbidities: DM, R hip pain,   are also affecting patient's functional outcome.   REHAB POTENTIAL: Good  CLINICAL DECISION MAKING: Evolving/moderate complexity  EVALUATION COMPLEXITY: Moderate   PLAN:  PT FREQUENCY: 1-2x/week  PT DURATION: 8 weeks  PLANNED INTERVENTIONS: 97110-Therapeutic exercises, 97530- Therapeutic activity, [id number]- Neuromuscular re-education, 97535- Self Care, [id number]- Manual therapy, and [id number]- Canalith repositioning  PLAN FOR NEXT SESSION: reassess BPPV, when  able assess FGA and other balance tests   Caro Brundidge L Vivaan Helseth,  PT, DPT, OCS 08/13/2023, 5:15 PM

## 2023-08-20 ENCOUNTER — Ambulatory Visit: Payer: Medicare Other | Admitting: Physical Therapy

## 2023-08-20 ENCOUNTER — Encounter: Payer: Self-pay | Admitting: Physical Therapy

## 2023-08-20 DIAGNOSIS — R262 Difficulty in walking, not elsewhere classified: Secondary | ICD-10-CM

## 2023-08-20 DIAGNOSIS — H8111 Benign paroxysmal vertigo, right ear: Secondary | ICD-10-CM

## 2023-08-20 DIAGNOSIS — R2689 Other abnormalities of gait and mobility: Secondary | ICD-10-CM

## 2023-08-20 NOTE — Therapy (Signed)
OUTPATIENT PHYSICAL THERAPY TREATMENT     Patient Name: [name] MRN: [id number] DOB:12/08/1940, 83 y.o., female Today's Date: 08/20/2023  END OF SESSION:  PT End of Session - 08/20/23 1409     Visit Number 2    Date for PT Re-Evaluation 10/08/23    Progress Note Due on Visit 10    PT Start Time 1405    PT Stop Time 1445    PT Time Calculation (min) 40 min    Activity Tolerance Patient tolerated treatment well    Behavior During Therapy [facility] for tasks assessed/performed             Past Medical History:  Diagnosis Date   Arthritis    RIGHT KNEE PAIN AND OA   Cataract    Chest pain 05/03/2015   Diabetes mellitus without complication (HCC)    Elevated cholesterol    GERD (gastroesophageal reflux disease)    Glaucoma 2018   History of kidney stones    Hypertension    Hypothyroidism    IBS (irritable bowel syndrome)    Localized swelling of both lower legs    Rotator cuff disorder    BOTH SHOULDERS - NO SURGERY - AND STATES LIMITATIONS IN ARM MOVEMENT   Shortness of breath 05/03/2015   Swelling    CHRONIC SWELLING RIGHT HAND AND BOTH FEET AND BOTH LEGS   Vertigo    Past Surgical History:  Procedure Laterality Date   ABDOMINAL HYSTERECTOMY  1985   BREAST SURGERY  1985   RIGHT BREAST BIOPSY - BENIGN   CARPAL TUNNEL RELEASE Left 02/05/2019   Procedure: LEFT CARPAL TUNNEL RELEASE;  Surgeon: Cindee Salt, MD;  Location: [facility];  Service: Orthopedics;  Laterality: Left;   CARPAL TUNNEL RELEASE Right 07/25/2021   Procedure: RIGHT CARPAL TUNNEL RELEASE;  Surgeon: Cindee Salt, MD;  Location: [facility];  Service: Orthopedics;  Laterality: Right;  Bier block   CHOLECYSTECTOMY  1975   EYE SURGERY  1995   BILATERAL CATARACT EXTRACTIONS; SURGERY FOR MACULAR HOLE    HEMORRHOID SURGERY  1975   JOINT REPLACEMENT     PARATHYROIDECTOMY Right 04/12/2022   Procedure: RIGHT INFERIOR PARATHYROIDECTOMY;  Surgeon: Darnell Level, MD;  Location: [facility];  Service: General;  Laterality: Right;   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   Procedure: RIGHT TOTAL KNEE ARTHROPLASTY;  Surgeon: Loanne Drilling, MD;  Location: [facility];  Service: Orthopedics;  Laterality: Right;   Patient Active Problem List   Diagnosis Date Noted   Dizziness 07/28/2023   Right hip pain 07/28/2023   Hypertensive heart disease without heart failure 05/22/2023   Gastroesophageal reflux disease without esophagitis 03/31/2023   Primary insomnia 03/11/2023   Aortic atherosclerosis (HCC) 07/21/2022   Claudication (HCC) 06/26/2022   Dermatitis 05/06/2022   Erythrocytosis 05/06/2022   Swelling of lower extremity 05/06/2022   Dyslipidemia associated with type 2 diabetes mellitus (HCC) 10/28/2021   Multiple lung nodules 10/28/2021   Hypercalcemia 10/28/2021   Class 2 severe obesity due to excess calories with serious comorbidity and body mass index (BMI) of 37.0 to 37.9 in adult [facility]) 07/13/2020   Primary hypothyroidism 04/06/2018   Hyperlipidemia 04/06/2018   Essential hypertension 04/06/2018   Abnormal glucose 04/06/2018   Chest pain 05/03/2015   Shortness of breath 05/03/2015   Dizziness and giddiness 05/18/2014   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  Pt accompanied by: self  PERTINENT HISTORY: referred by PCP for vertigo eval  PAIN:  Are you having pain? L post hip, to see Md about next week  PRECAUTIONS: Fall  RED  FLAGS: None   WEIGHT BEARING RESTRICTIONS: No  FALLS: Has patient fallen in last 6 months? No  LIVING ENVIRONMENT: Lives with: lives alone Lives in: House/apartment Stairs: No Has following equipment at home: Single point cane and Environmental consultant - 2 wheeled  PLOF: Independent  PATIENT GOALS: get rid of dizziness  OBJECTIVE: CT results forwarded:  Note: Objective measures were completed at Evaluation unless otherwise noted.  DIAGNOSTIC FINDINGS: No acute intracranial process. No etiology is seen for the patient's symptoms.     Electronically Signed   By: Wiliam Ke M.D.   On: 07/31/2023 20:14  COGNITION: Overall cognitive status: Within functional limits for tasks assessed   SENSATION: WFL  EDEMA:  na  POSTURE:  rounded shoulders and forward head  Cervical ROM:    Active A/PROM (deg) eval  Flexion wfl  Extension 80  Right lateral flexion   Left lateral flexion   Right rotation 75  Left rotation 85  (Blank rows = not tested)  STRENGTH: wfl UE's  BED MOBILITY:  I all bed mobility, but dizzy once sitting upright       GAIT: Gait pattern: decreased arm swing- Right, decreased arm swing- Left, decreased step length- Right, decreased step length- Left, decreased stance time- Right, decreased stance time- Left, decreased stride length, shuffling, and trunk flexed Distance walked: 66 ' in clinic Assistive device utilized: None Level of assistance: Modified independence Comments: maintains rigid trunk, maintains stiff neck, looks forward   FUNCTIONAL TESTS:  Functional gait assessment: TBD  PATIENT SURVEYS:  DHI 30%or 30/100  VESTIBULAR ASSESSMENT:  GENERAL OBSERVATION: no head movement with conversation or with gait initially   SYMPTOM BEHAVIOR:  Subjective history: sudden onset dizziness, saw PCP, referred for BPPV assessment by PT, initial severity of Sx has improved but still lingering dizziness and notices when lying down in bed .occurs lying down  on R side   Non-Vestibular symptoms: nausea/vomiting  Type of dizziness: Spinning/Vertigo and "Funny feeling in the head"  Frequency: daily, nightly  Duration: several mins  Aggravating factors: Induced by position change: lying supine  Relieving factors: head stationary  Progression of symptoms: better  OCULOMOTOR EXAM:  Ocular Alignment: normal  Ocular ROM: No Limitations  Spontaneous Nystagmus: absent  Gaze-Induced Nystagmus: absent  Smooth Pursuits: intact  Saccades: intact  Convergence/Divergence: 1 cm    VESTIBULAR - OCULAR REFLEX:   Slow VOR: Normal  VOR Cancellation: Unable to Maintain Gaze  Head-Impulse Test: HIT Right: positive  Dynamic Visual Acuity: Not able to be assessed   POSITIONAL TESTING: Right Dix-Hallpike: upbeating, right nystagmus Right Roll Test: no nystagmus Left Roll Test: no nystagmus  MOTION SENSITIVITY:  Motion Sensitivity Quotient Intensity: 0 = none, 1 = Lightheaded, 2 = Mild, 3 = Moderate, 4 = Severe, 5 = Vomiting  Intensity  1. Sitting to supine   2. Supine to L side   3. Supine to R side  4. Supine to sitting   5. L Hallpike-Dix   6. Up from L    7. R Hallpike-Dix   8. Up from R    9. Sitting, head tipped to L knee   10. Head up from L knee   11. Sitting, head tipped to R knee   12. Head up from R knee   13. Sitting head turns x5   14.Sitting head nods x5   15. In stance, 180 turn to L    16. In stance, 180 turn to R     OTHOSTATICS: not done  FUNCTIONAL GAIT: Functional gait assessment: TBD                                                                                                                             TREATMENT DATE:   08/20/23  Manual Therapy: to decrease muscle spasm and pain and improve mobility STM/TPR to cervical paraspinals, suboccipital release, NAGS into rotation Self Care: Education on findings, precautions (no big position changes, sleep on back tonight, avoid bending forward) education on  anatomy Canalith Repositioning:  Epley Right: Number of Reps: 2, Symptoms resolved on right  08/13/23:  Canalith Repositioning:  Epley Right: Number of Reps: 3, improved Sx progressive   PATIENT EDUCATION: Education details: see self care Person educated: Patient Education method: Explanation, Demonstration, Tactile cues, and Verbal cues Education comprehension: verbalized understanding, returned demonstration, and verbal cues required  HOME EXERCISE PROGRAM:  GOALS: Goals reviewed with patient? Yes  SHORT TERM GOALS: Target date: 2 weeks   I home management of vertigo Sx Baseline: Goal status: IN PROGRESS   LONG TERM GOALS: Target date: 8 weeks to 10/08/23  DHI improve from 30 % deficit to 10% deficit Baseline:  Goal status: IN PROGRESS  2.  DHI score 27/30 Baseline: TBD Goal status: IN PROGRESS  3.  Resolution of BPPV with positional testing Baseline:  Goal status: IN PROGRESS    ASSESSMENT:  CLINICAL IMPRESSION: Patient is a 83 y.o. female who was treated today by physical therapy  for vertigo.  She again presented with R posterior canalithiasis, after Epley x 1, did not have nystagmus and only faint dizziness with dix-hallpike, so repeated.  Of note both times with rolling to left to get out of testing position and again sitting up from manual therapy rolling to L she reported dizziness.   Lacking cervical ROM for L roll test today, but since so symptomatic with L sidelying suspect that she may have L horizontal canalithiasis as well, with check next session.  [name] continues to demonstrate potential for improvement and would benefit from continued skilled therapy to address impairments.     OBJECTIVE IMPAIRMENTS: decreased activity tolerance, decreased balance, dizziness, and impaired perceived functional ability.   ACTIVITY LIMITATIONS: bending, sleeping, bed mobility, and locomotion level  PARTICIPATION LIMITATIONS: driving, community activity, and yard  work  PERSONAL FACTORS: Age, Behavior pattern, and 1-2 comorbidities: DM, R hip  pain,   are also affecting patient's functional outcome.   REHAB POTENTIAL: Good  CLINICAL DECISION MAKING: Evolving/moderate complexity  EVALUATION COMPLEXITY: Moderate   PLAN:  PT FREQUENCY: 1-2x/week  PT DURATION: 8 weeks  PLANNED INTERVENTIONS: 97110-Therapeutic exercises, 97530- Therapeutic activity, [id number]- Neuromuscular re-education, 97535- Self Care, [id number]- Manual therapy, and 95992- Canalith repositioning  PLAN FOR NEXT SESSION: reassess BPPV, when able assess FGA and other balance tests   Jena Gauss, PT, DPT 08/20/2023, 2:55 PM

## 2023-08-22 ENCOUNTER — Ambulatory Visit: Payer: Medicare Other

## 2023-08-22 ENCOUNTER — Other Ambulatory Visit: Payer: Self-pay

## 2023-08-22 DIAGNOSIS — H8111 Benign paroxysmal vertigo, right ear: Secondary | ICD-10-CM

## 2023-08-22 DIAGNOSIS — R2689 Other abnormalities of gait and mobility: Secondary | ICD-10-CM

## 2023-08-22 DIAGNOSIS — R262 Difficulty in walking, not elsewhere classified: Secondary | ICD-10-CM

## 2023-08-22 NOTE — Therapy (Signed)
OUTPATIENT PHYSICAL THERAPY TREATMENT     Patient Name: [name] MRN: [id number] DOB:1940/12/13, 83 y.o., female Today's Date: 08/22/2023  END OF SESSION:  PT End of Session - 08/22/23 1455     Visit Number 3    Date for PT Re-Evaluation 10/08/23    Progress Note Due on Visit 10    PT Start Time 1445    PT Stop Time 1515    PT Time Calculation (min) 30 min    Activity Tolerance Patient tolerated treatment well    Behavior During Therapy [facility] for tasks assessed/performed              Past Medical History:  Diagnosis Date   Arthritis    RIGHT KNEE PAIN AND OA   Cataract    Chest pain 05/03/2015   Diabetes mellitus without complication (HCC)    Elevated cholesterol    GERD (gastroesophageal reflux disease)    Glaucoma 2018   History of kidney stones    Hypertension    Hypothyroidism    IBS (irritable bowel syndrome)    Localized swelling of both lower legs    Rotator cuff disorder    BOTH SHOULDERS - NO SURGERY - AND STATES LIMITATIONS IN ARM MOVEMENT   Shortness of breath 05/03/2015   Swelling    CHRONIC SWELLING RIGHT HAND AND BOTH FEET AND BOTH LEGS   Vertigo    Past Surgical History:  Procedure Laterality Date   ABDOMINAL HYSTERECTOMY  1985   BREAST SURGERY  1985   RIGHT BREAST BIOPSY - BENIGN   CARPAL TUNNEL RELEASE Left 02/05/2019   Procedure: LEFT CARPAL TUNNEL RELEASE;  Surgeon: Cindee Salt, MD;  Location: [facility];  Service: Orthopedics;  Laterality: Left;   CARPAL TUNNEL RELEASE Right 07/25/2021   Procedure: RIGHT CARPAL TUNNEL RELEASE;  Surgeon: Cindee Salt, MD;  Location: [facility];  Service: Orthopedics;  Laterality: Right;  Bier block   CHOLECYSTECTOMY  1975   EYE SURGERY  1995   BILATERAL CATARACT EXTRACTIONS; SURGERY FOR MACULAR HOLE    HEMORRHOID SURGERY  1975   JOINT REPLACEMENT     PARATHYROIDECTOMY Right 04/12/2022   Procedure: RIGHT INFERIOR PARATHYROIDECTOMY;  Surgeon: Darnell Level, MD;  Location:  [facility];  Service: General;  Laterality: Right;   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   Procedure: RIGHT TOTAL KNEE ARTHROPLASTY;  Surgeon: Loanne Drilling, MD;  Location: [facility];  Service: Orthopedics;  Laterality: Right;   Patient Active Problem List   Diagnosis Date Noted   Dizziness 07/28/2023   Right hip pain 07/28/2023   Hypertensive heart disease without heart failure 05/22/2023   Gastroesophageal reflux disease without esophagitis 03/31/2023   Primary insomnia 03/11/2023   Aortic atherosclerosis (HCC) 07/21/2022   Claudication (HCC) 06/26/2022   Dermatitis 05/06/2022   Erythrocytosis 05/06/2022   Swelling of lower extremity 05/06/2022   Dyslipidemia associated with type 2 diabetes mellitus (HCC) 10/28/2021   Multiple lung nodules 10/28/2021   Hypercalcemia 10/28/2021   Class 2 severe obesity due to excess calories with serious comorbidity and body mass index (BMI) of 37.0 to 37.9 in adult [facility]) 07/13/2020   Primary hypothyroidism 04/06/2018   Hyperlipidemia 04/06/2018   Essential hypertension 04/06/2018   Abnormal glucose 04/06/2018   Chest pain 05/03/2015   Shortness of breath 05/03/2015   Dizziness and giddiness 05/18/2014   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  Head still feels "full"  Pt accompanied by: self  PERTINENT HISTORY: referred by PCP for vertigo eval  PAIN:  Are you having pain? L post  hip, to see Md about next week  PRECAUTIONS: Fall  RED FLAGS: None   WEIGHT BEARING RESTRICTIONS: No  FALLS: Has patient fallen in last 6 months? No  LIVING ENVIRONMENT: Lives with: lives alone Lives in: House/apartment Stairs: No Has following equipment at home: Single point cane and Environmental consultant - 2 wheeled  PLOF: Independent  PATIENT GOALS: get rid of dizziness  OBJECTIVE: CT results forwarded:  Note: Objective measures were completed at Evaluation unless otherwise noted.  DIAGNOSTIC FINDINGS: No acute intracranial process. No etiology is seen for the patient's symptoms.     Electronically Signed   By: Wiliam Ke M.D.   On: 07/31/2023 20:14  COGNITION: Overall cognitive status: Within functional limits for tasks assessed   SENSATION: WFL  EDEMA:  na  POSTURE:  rounded shoulders and forward head  Cervical ROM:    Active A/PROM (deg) eval  Flexion wfl  Extension 80  Right lateral flexion   Left lateral flexion   Right rotation 75  Left rotation 85  (Blank rows = not tested)  STRENGTH: wfl UE's  BED MOBILITY:  I all bed mobility, but dizzy once sitting upright       GAIT: Gait pattern: decreased arm swing- Right, decreased arm swing- Left, decreased step length- Right, decreased step length- Left, decreased stance time- Right, decreased stance time- Left, decreased stride length, shuffling, and trunk flexed Distance walked: 84 ' in clinic Assistive device utilized: None Level of assistance: Modified independence Comments: maintains rigid trunk, maintains stiff neck, looks forward   FUNCTIONAL TESTS:  Functional gait assessment: TBD  PATIENT SURVEYS:  DHI 30%or 30/100  VESTIBULAR ASSESSMENT:  GENERAL OBSERVATION: no head movement with conversation or with gait initially   SYMPTOM BEHAVIOR:  Subjective history: sudden onset dizziness, saw PCP, referred for BPPV assessment by PT, initial severity of Sx has improved but still lingering  dizziness and notices when lying down in bed .occurs lying down on R side   Non-Vestibular symptoms: nausea/vomiting  Type of dizziness: Spinning/Vertigo and "Funny feeling in the head"  Frequency: daily, nightly  Duration: several mins  Aggravating factors: Induced by position change: lying supine  Relieving factors: head stationary  Progression of symptoms: better  OCULOMOTOR EXAM:  Ocular Alignment: normal  Ocular ROM: No Limitations  Spontaneous Nystagmus: absent  Gaze-Induced Nystagmus: absent  Smooth Pursuits: intact  Saccades: intact  Convergence/Divergence: 1 cm    VESTIBULAR - OCULAR REFLEX:   Slow VOR: Normal  VOR Cancellation: Unable to Maintain Gaze  Head-Impulse Test: HIT Right: positive  Dynamic Visual Acuity: Not able to be assessed   POSITIONAL TESTING: Right Dix-Hallpike: upbeating, right nystagmus Right Roll Test: no nystagmus Left Roll Test: no nystagmus  MOTION SENSITIVITY:  Motion Sensitivity Quotient Intensity: 0 = none, 1 = Lightheaded, 2 = Mild, 3 = Moderate, 4 = Severe, 5 = Vomiting  Intensity  1. Sitting to supine   2. Supine  to L side   3. Supine to R side   4. Supine to sitting   5. L Hallpike-Dix   6. Up from L    7. R Hallpike-Dix   8. Up from R    9. Sitting, head tipped to L knee   10. Head up from L knee   11. Sitting, head tipped to R knee   12. Head up from R knee   13. Sitting head turns x5   14.Sitting head nods x5   15. In stance, 180 turn to L    16. In stance, 180 turn to R     OTHOSTATICS: not done  FUNCTIONAL GAIT: Functional gait assessment: TBD                                                                                                                             TREATMENT DATE:  08/22/23: Reassessed for horizontal and posterior canals with dix hal pike, and log roll, brief initial dizziness with L log roll, resolved within 2 sec. All other testing for R log roll, B dix hal pike clear. Performed one rep of BQ  roll to correct for L horizontal canalithiasis.  Resolved Sx with repeat testing  Self care: Instructed in modified Epley maneuver, sitting on sofa, head turned away and into slight extension, for sidelying 30 sec each side, repeat 3 to 5 x at home if Sx persist or increase. Advised to avoid for next 2 days to evaluate response to today's Rx  08/20/23  Manual Therapy: to decrease muscle spasm and pain and improve mobility STM/TPR to cervical paraspinals, suboccipital release, NAGS into rotation Self Care: Education on findings, precautions (no big position changes, sleep on back tonight, avoid bending forward) education on anatomy Canalith Repositioning:  Epley Right: Number of Reps: 2, Symptoms resolved on right  08/13/23:  Canalith Repositioning:  Epley Right: Number of Reps: 3, improved Sx progressive   PATIENT EDUCATION: Education details: see self care Person educated: Patient Education method: Explanation, Demonstration, Tactile cues, and Verbal cues Education comprehension: verbalized understanding, returned demonstration, and verbal cues required  HOME EXERCISE PROGRAM:  GOALS: Goals reviewed with patient? Yes  SHORT TERM GOALS: Target date: 2 weeks   I home management of vertigo Sx Baseline: Goal status: IN PROGRESS   LONG TERM GOALS: Target date: 8 weeks to 10/08/23  DHI improve from 30 % deficit to 10% deficit Baseline:  Goal status: IN PROGRESS  2.  DHI score 27/30 Baseline: TBD Goal status: IN PROGRESS  3.  Resolution of BPPV with positional testing Baseline:  Goal status: IN PROGRESS    ASSESSMENT:  CLINICAL IMPRESSION: Patient is a 83 y.o. female who was treated today by physical therapy  for vertigo.  Reassessed all canals and rx for l horizontal canalithiasis with resolution of Sx by end of session.  Her sx are much reduced and most likely to be resolved soon.  We kept her appts for  next week to see how she feels over the weekend, if Sx free she will  cancel.  Excellent response to interventions so far.    HAELY LEYLAND continues to demonstrate potential for improvement and would benefit from continued skilled therapy to address impairments as needed/.     OBJECTIVE IMPAIRMENTS: decreased activity tolerance, decreased balance, dizziness, and impaired perceived functional ability.   ACTIVITY LIMITATIONS: bending, sleeping, bed mobility, and locomotion level  PARTICIPATION LIMITATIONS: driving, community activity, and yard work  PERSONAL FACTORS: Age, Behavior pattern, and 1-2 comorbidities: DM, R hip pain,   are also affecting patient's functional outcome.   REHAB POTENTIAL: Good  CLINICAL DECISION MAKING: Evolving/moderate complexity  EVALUATION COMPLEXITY: Moderate   PLAN:  PT FREQUENCY: 1-2x/week  PT DURATION: 8 weeks  PLANNED INTERVENTIONS: 97110-Therapeutic exercises, 97530- Therapeutic activity, [id number]- Neuromuscular re-education, 97535- Self Care, 53664- Manual therapy, and 95992- Canalith repositioning  PLAN FOR NEXT SESSION: reassess BPPV, when able assess FGA and other balance tests   Maresha Anastos L Gaurav Baldree, PT, DPT, OCS 08/22/2023, 3:22 PM

## 2023-08-26 ENCOUNTER — Ambulatory Visit: Payer: Medicare Other | Admitting: Physical Therapy

## 2023-08-26 ENCOUNTER — Encounter: Payer: Self-pay | Admitting: Physical Therapy

## 2023-08-26 DIAGNOSIS — R262 Difficulty in walking, not elsewhere classified: Secondary | ICD-10-CM

## 2023-08-26 DIAGNOSIS — R2689 Other abnormalities of gait and mobility: Secondary | ICD-10-CM

## 2023-08-26 DIAGNOSIS — H8111 Benign paroxysmal vertigo, right ear: Secondary | ICD-10-CM | POA: Diagnosis not present

## 2023-08-26 NOTE — Therapy (Signed)
OUTPATIENT PHYSICAL THERAPY TREATMENT     Patient Name: [name] MRN: [id number] DOB:10-23-1940, 83 y.o., female Today's Date: 08/26/2023  END OF SESSION:  PT End of Session - 08/26/23 1458     Visit Number 4    Date for PT Re-Evaluation 10/08/23    Progress Note Due on Visit 10    PT Start Time 1449    Activity Tolerance Patient tolerated treatment well    Behavior During Therapy [facility] for tasks assessed/performed              Past Medical History:  Diagnosis Date   Arthritis    RIGHT KNEE PAIN AND OA   Cataract    Chest pain 05/03/2015   Diabetes mellitus without complication (HCC)    Elevated cholesterol    GERD (gastroesophageal reflux disease)    Glaucoma 2018   History of kidney stones    Hypertension    Hypothyroidism    IBS (irritable bowel syndrome)    Localized swelling of both lower legs    Rotator cuff disorder    BOTH SHOULDERS - NO SURGERY - AND STATES LIMITATIONS IN ARM MOVEMENT   Shortness of breath 05/03/2015   Swelling    CHRONIC SWELLING RIGHT HAND AND BOTH FEET AND BOTH LEGS   Vertigo    Past Surgical History:  Procedure Laterality Date   ABDOMINAL HYSTERECTOMY  1985   BREAST SURGERY  1985   RIGHT BREAST BIOPSY - BENIGN   CARPAL TUNNEL RELEASE Left 02/05/2019   Procedure: LEFT CARPAL TUNNEL RELEASE;  Surgeon: Cindee Salt, MD;  Location: [facility];  Service: Orthopedics;  Laterality: Left;   CARPAL TUNNEL RELEASE Right 07/25/2021   Procedure: RIGHT CARPAL TUNNEL RELEASE;  Surgeon: Cindee Salt, MD;  Location: [facility];  Service: Orthopedics;  Laterality: Right;  Bier block   CHOLECYSTECTOMY  1975   EYE SURGERY  1995   BILATERAL CATARACT EXTRACTIONS; SURGERY FOR MACULAR HOLE    HEMORRHOID SURGERY  1975   JOINT REPLACEMENT     PARATHYROIDECTOMY Right 04/12/2022   Procedure: RIGHT INFERIOR PARATHYROIDECTOMY;  Surgeon: Darnell Level, MD;  Location: [facility];  Service: General;  Laterality: Right;   TOTAL  KNEE ARTHROPLASTY Right 12/29/2012   Procedure: RIGHT TOTAL KNEE ARTHROPLASTY;  Surgeon: Loanne Drilling, MD;  Location: [facility];  Service: Orthopedics;  Laterality: Right;   Patient Active Problem List   Diagnosis Date Noted   Dizziness 07/28/2023   Right hip pain 07/28/2023   Hypertensive heart disease without heart failure 05/22/2023   Gastroesophageal reflux disease without esophagitis 03/31/2023   Primary insomnia 03/11/2023   Aortic atherosclerosis (HCC) 07/21/2022   Claudication (HCC) 06/26/2022   Dermatitis 05/06/2022   Erythrocytosis 05/06/2022   Swelling of lower extremity 05/06/2022   Dyslipidemia associated with type 2 diabetes mellitus (HCC) 10/28/2021   Multiple lung nodules 10/28/2021   Hypercalcemia 10/28/2021   Class 2 severe obesity due to excess calories with serious comorbidity and body mass index (BMI) of 37.0 to 37.9 in adult [facility]) 07/13/2020   Primary hypothyroidism 04/06/2018   Hyperlipidemia 04/06/2018   Essential hypertension 04/06/2018   Abnormal glucose 04/06/2018   Chest pain 05/03/2015   Shortness of breath 05/03/2015   Dizziness and giddiness 05/18/2014   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  Pt accompanied by: self  PERTINENT HISTORY: referred by PCP for vertigo eval  PAIN:  Are you having pain? L post hip, to see Md about next week  PRECAUTIONS: Fall  RED  FLAGS: None   WEIGHT BEARING RESTRICTIONS: No  FALLS: Has patient fallen in last 6 months? No  LIVING ENVIRONMENT: Lives with: lives alone Lives in: House/apartment Stairs: No Has following equipment at home: Single point cane and Environmental consultant - 2 wheeled  PLOF: Independent  PATIENT GOALS: get rid of dizziness  OBJECTIVE: CT results forwarded:  Note: Objective measures were completed at Evaluation unless otherwise noted.  DIAGNOSTIC FINDINGS: No acute intracranial process. No etiology is seen for the patient's symptoms.     Electronically Signed   By: Wiliam Ke M.D.   On: 07/31/2023 20:14  COGNITION: Overall cognitive status: Within functional limits for tasks assessed   SENSATION: WFL  EDEMA:  na  POSTURE:  rounded shoulders and forward head  Cervical ROM:    Active A/PROM (deg) eval  Flexion wfl  Extension 80  Right lateral flexion   Left lateral flexion   Right rotation 75  Left rotation 85  (Blank rows = not tested)  STRENGTH: wfl UE's  BED MOBILITY:  I all bed mobility, but dizzy once sitting upright       GAIT: Gait pattern: decreased arm swing- Right, decreased arm swing- Left, decreased step length- Right, decreased step length- Left, decreased stance time- Right, decreased stance time- Left, decreased stride length, shuffling, and trunk flexed Distance walked: 5 ' in clinic Assistive device utilized: None Level of assistance: Modified independence Comments: maintains rigid trunk, maintains stiff neck, looks forward   FUNCTIONAL TESTS:  Functional gait assessment: TBD  PATIENT SURVEYS:  DHI 30%or 30/100  VESTIBULAR ASSESSMENT:  GENERAL OBSERVATION: no head movement with conversation or with gait initially   SYMPTOM BEHAVIOR:  Subjective history: sudden onset dizziness, saw PCP, referred for BPPV assessment by PT, initial severity of Sx has improved but still lingering dizziness and notices when lying down in bed .occurs lying down  on R side   Non-Vestibular symptoms: nausea/vomiting  Type of dizziness: Spinning/Vertigo and "Funny feeling in the head"  Frequency: daily, nightly  Duration: several mins  Aggravating factors: Induced by position change: lying supine  Relieving factors: head stationary  Progression of symptoms: better  OCULOMOTOR EXAM:  Ocular Alignment: normal  Ocular ROM: No Limitations  Spontaneous Nystagmus: absent  Gaze-Induced Nystagmus: absent  Smooth Pursuits: intact  Saccades: intact  Convergence/Divergence: 1 cm    VESTIBULAR - OCULAR REFLEX:   Slow VOR: Normal  VOR Cancellation: Unable to Maintain Gaze  Head-Impulse Test: HIT Right: positive  Dynamic Visual Acuity: Not able to be assessed   POSITIONAL TESTING: Right Dix-Hallpike: upbeating, right nystagmus Right Roll Test: no nystagmus Left Roll Test: no nystagmus  MOTION SENSITIVITY:  Motion Sensitivity Quotient Intensity: 0 = none, 1 = Lightheaded, 2 = Mild, 3 = Moderate, 4 = Severe, 5 = Vomiting  Intensity  1. Sitting to supine   2. Supine to L side   3. Supine to R side   4. Supine to sitting   5.  L Hallpike-Dix   6. Up from L    7. R Hallpike-Dix   8. Up from R    9. Sitting, head tipped to L knee   10. Head up from L knee   11. Sitting, head tipped to R knee   12. Head up from R knee   13. Sitting head turns x5   14.Sitting head nods x5   15. In stance, 180 turn to L    16. In stance, 180 turn to R     OTHOSTATICS: not done  FUNCTIONAL GAIT: Functional gait assessment: TBD                                                                                                                             TREATMENT DATE:   08/26/23  Canalith Repositioning:   Epley Right: Number of Reps: 3, Symptoms improving however unable to complete last maneuver patient became ill so stopped.   08/22/23: Reassessed for horizontal and posterior canals with dix hal pike, and log roll, brief initial dizziness with L log roll,  resolved within 2 sec. All other testing for R log roll, B dix hal pike clear. Performed one rep of BQ roll to correct for L horizontal canalithiasis.  Resolved Sx with repeat testing  Self care: Instructed in modified Epley maneuver, sitting on sofa, head turned away and into slight extension, for sidelying 30 sec each side, repeat 3 to 5 x at home if Sx persist or increase. Advised to avoid for next 2 days to evaluate response to today's Rx  08/20/23  Manual Therapy: to decrease muscle spasm and pain and improve mobility STM/TPR to cervical paraspinals, suboccipital release, NAGS into rotation Self Care: Education on findings, precautions (no big position changes, sleep on back tonight, avoid bending forward) education on anatomy Canalith Repositioning:  Epley Right: Number of Reps: 2, Symptoms resolved on right  08/13/23:  Canalith Repositioning:  Epley Right: Number of Reps: 3, improved Sx progressive   PATIENT EDUCATION: Education details: ok to resume upright activities Person educated: Patient Education method: Explanation, Demonstration, Tactile cues, and Verbal cues Education comprehension: verbalized understanding, returned demonstration, and verbal cues required  HOME EXERCISE PROGRAM:  GOALS: Goals reviewed with patient? Yes  SHORT TERM GOALS: Target date: 2 weeks   I home management of vertigo Sx Baseline: Goal status: IN PROGRESS   LONG TERM GOALS: Target date: 8 weeks to 10/08/23  DHI improve from 30 % deficit to 10% deficit Baseline:  Goal status: IN PROGRESS  2.  DHI score 27/30 Baseline: TBD Goal status: IN PROGRESS  3.  Resolution of BPPV with positional testing Baseline:  Goal status: IN PROGRESS    ASSESSMENT:  CLINICAL IMPRESSION: Patient is a 83 y.o. female who was treated today by physical therapy  for vertigo. She returned today reporting several episodes of vertigo over the weekend and with R dix-hallpike was positive for R horizontal  canalithiasis again.  Performed Epley  x 2, symptoms improved after 2nd Epley, however with 3rd rep, [name] because very nausous and vomited, so session ended.  Will try to see her again tomorrow since her appt later this week may be cancelled due to poor weather.   HARMAN FERRIN continues to demonstrate potential for improvement and would benefit from continued skilled therapy to address impairments as needed/.     OBJECTIVE IMPAIRMENTS: decreased activity tolerance, decreased balance, dizziness, and impaired perceived functional ability.   ACTIVITY LIMITATIONS: bending, sleeping, bed mobility, and locomotion level  PARTICIPATION LIMITATIONS: driving, community activity, and yard work  PERSONAL FACTORS: Age, Behavior pattern, and 1-2 comorbidities: DM, R hip pain,   are also affecting patient's functional outcome.   REHAB POTENTIAL: Good  CLINICAL DECISION MAKING: Evolving/moderate complexity  EVALUATION COMPLEXITY: Moderate   PLAN:  PT FREQUENCY: 1-2x/week  PT DURATION: 8 weeks  PLANNED INTERVENTIONS: 97110-Therapeutic exercises, 97530- Therapeutic activity, [id number]- Neuromuscular re-education, 97535- Self Care, 16109- Manual therapy, and 95992- Canalith repositioning  PLAN FOR NEXT SESSION: reassess BPPV, when able assess FGA and other balance tests   Jena Gauss, PT, DPT 08/26/2023, 3:27 PM

## 2023-08-27 ENCOUNTER — Other Ambulatory Visit: Payer: Self-pay

## 2023-08-27 ENCOUNTER — Ambulatory Visit: Payer: Medicare Other

## 2023-08-27 DIAGNOSIS — R262 Difficulty in walking, not elsewhere classified: Secondary | ICD-10-CM

## 2023-08-27 DIAGNOSIS — H8111 Benign paroxysmal vertigo, right ear: Secondary | ICD-10-CM

## 2023-08-27 DIAGNOSIS — R2689 Other abnormalities of gait and mobility: Secondary | ICD-10-CM

## 2023-08-27 NOTE — Therapy (Signed)
OUTPATIENT PHYSICAL THERAPY TREATMENT     Patient Name: [name] MRN: [id number] DOB:22-Jun-1941, 83 y.o., female Today's Date: 08/27/2023  END OF SESSION:     Past Medical History:  Diagnosis Date   Arthritis    RIGHT KNEE PAIN AND OA   Cataract    Chest pain 05/03/2015   Diabetes mellitus without complication (HCC)    Elevated cholesterol    GERD (gastroesophageal reflux disease)    Glaucoma 2018   History of kidney stones    Hypertension    Hypothyroidism    IBS (irritable bowel syndrome)    Localized swelling of both lower legs    Rotator cuff disorder    BOTH SHOULDERS - NO SURGERY - AND STATES LIMITATIONS IN ARM MOVEMENT   Shortness of breath 05/03/2015   Swelling    CHRONIC SWELLING RIGHT HAND AND BOTH FEET AND BOTH LEGS   Vertigo    Past Surgical History:  Procedure Laterality Date   ABDOMINAL HYSTERECTOMY  1985   BREAST SURGERY  1985   RIGHT BREAST BIOPSY - BENIGN   CARPAL TUNNEL RELEASE Left 02/05/2019   Procedure: LEFT CARPAL TUNNEL RELEASE;  Surgeon: Cindee Salt, MD;  Location: [facility];  Service: Orthopedics;  Laterality: Left;   CARPAL TUNNEL RELEASE Right 07/25/2021   Procedure: RIGHT CARPAL TUNNEL RELEASE;  Surgeon: Cindee Salt, MD;  Location: [facility];  Service: Orthopedics;  Laterality: Right;  Bier block   CHOLECYSTECTOMY  1975   EYE SURGERY  1995   BILATERAL CATARACT EXTRACTIONS; SURGERY FOR MACULAR HOLE    HEMORRHOID SURGERY  1975   JOINT REPLACEMENT     PARATHYROIDECTOMY Right 04/12/2022   Procedure: RIGHT INFERIOR PARATHYROIDECTOMY;  Surgeon: Darnell Level, MD;  Location: [facility];  Service: General;  Laterality: Right;   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   Procedure: RIGHT TOTAL KNEE ARTHROPLASTY;  Surgeon: Loanne Drilling, MD;  Location: [facility];  Service: Orthopedics;  Laterality: Right;   Patient Active Problem List   Diagnosis Date Noted   Dizziness 07/28/2023   Right hip pain 07/28/2023    Hypertensive heart disease without heart failure 05/22/2023   Gastroesophageal reflux disease without esophagitis 03/31/2023   Primary insomnia 03/11/2023   Aortic atherosclerosis (HCC) 07/21/2022   Claudication (HCC) 06/26/2022   Dermatitis 05/06/2022   Erythrocytosis 05/06/2022   Swelling of lower extremity 05/06/2022   Dyslipidemia associated with type 2 diabetes mellitus (HCC) 10/28/2021   Multiple lung nodules 10/28/2021   Hypercalcemia 10/28/2021   Class 2 severe obesity due to excess calories with serious comorbidity and body mass index (BMI) of 37.0 to 37.9 in adult [facility]) 07/13/2020   Primary hypothyroidism 04/06/2018   Hyperlipidemia 04/06/2018   Essential hypertension 04/06/2018   Abnormal glucose 04/06/2018   Chest pain 05/03/2015   Shortness of breath 05/03/2015   Dizziness and giddiness 05/18/2014   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  ONSET DATE: 6 week ago 07/07/24  Rationale for Evaluation and Treatment: Rehabilitation  SUBJECTIVE:   SUBJECTIVE STATEMENT: Patient reports still feeling like she has " wavy head"  no strong episodes of dizziness since yesterday but still not quite normal . Pt accompanied by: self  PERTINENT HISTORY: referred by PCP for vertigo eval  PAIN:  Are you having pain? L post hip, to see Md about next week  PRECAUTIONS: Fall  RED FLAGS: None   WEIGHT BEARING RESTRICTIONS: No  FALLS: Has patient fallen in last 6 months? No  LIVING ENVIRONMENT: Lives with: lives alone Lives in: House/apartment Stairs: No Has following equipment at home: Single point cane and Environmental consultant - 2 wheeled  PLOF: Independent  PATIENT GOALS: get rid of dizziness  OBJECTIVE: CT results forwarded:  Note:  Objective measures were completed at Evaluation unless otherwise noted.  DIAGNOSTIC FINDINGS: No acute intracranial process. No etiology is seen for the patient's symptoms.     Electronically Signed   By: Wiliam Ke M.D.   On: 07/31/2023 20:14  COGNITION: Overall cognitive status: Within functional limits for tasks assessed   SENSATION: WFL  EDEMA:  na  POSTURE:  rounded shoulders and forward head  Cervical ROM:    Active A/PROM (deg) eval  Flexion wfl  Extension 80  Right lateral flexion   Left lateral flexion   Right rotation 75  Left rotation 85  (Blank rows = not tested)  STRENGTH: wfl UE's  BED MOBILITY:  I all bed mobility, but dizzy once sitting upright       GAIT: Gait pattern: decreased arm swing- Right, decreased arm swing- Left, decreased step length- Right, decreased step length- Left, decreased stance time- Right, decreased stance time- Left, decreased stride length, shuffling, and trunk flexed Distance walked: 62 ' in clinic Assistive device utilized: None Level of assistance: Modified independence Comments: maintains rigid trunk, maintains stiff neck, looks forward   FUNCTIONAL TESTS:  Functional gait assessment: TBD  PATIENT SURVEYS:  DHI 30%or 30/100  VESTIBULAR ASSESSMENT:  GENERAL OBSERVATION: no head movement with conversation or with gait initially   SYMPTOM BEHAVIOR:  Subjective history: sudden onset dizziness, saw PCP, referred for BPPV assessment by PT, initial severity of Sx has improved but still lingering dizziness and notices when lying down in bed .occurs lying down on R side   Non-Vestibular symptoms: nausea/vomiting  Type of dizziness: Spinning/Vertigo and "Funny feeling in the head"  Frequency: daily, nightly  Duration: several mins  Aggravating factors: Induced by position change: lying supine  Relieving factors: head stationary  Progression of symptoms: better  OCULOMOTOR EXAM:  Ocular Alignment:  normal  Ocular ROM: No Limitations  Spontaneous Nystagmus: absent  Gaze-Induced Nystagmus: absent  Smooth Pursuits: intact  Saccades: intact  Convergence/Divergence: 1 cm    VESTIBULAR - OCULAR REFLEX:   Slow VOR: Normal  VOR Cancellation: Unable to Maintain Gaze  Head-Impulse Test: HIT Right: positive  Dynamic Visual Acuity: Not able to be assessed   POSITIONAL TESTING: Right Dix-Hallpike: upbeating, right nystagmus Right Roll Test: no nystagmus Left Roll Test: no nystagmus  MOTION SENSITIVITY:  Motion Sensitivity Quotient Intensity: 0 = none, 1 = Lightheaded, 2 = Mild, 3 = Moderate, 4 = Severe, 5 = Vomiting  Intensity  1. Sitting to supine   2. Supine to L side   3. Supine to R side   4. Supine to sitting   5. L Hallpike-Dix   6. Up from L    7. R Hallpike-Dix   8. Up from R    9. Sitting, head tipped to L knee   10. Head up from L knee   11. Sitting, head tipped to R knee   12. Head up from R knee   13. Sitting head turns x5   14.Sitting head nods x5   15. In stance,  180 turn to L    16. In stance, 180 turn to R     OTHOSTATICS: not done  FUNCTIONAL GAIT: Functional gait assessment: TBD                                                                                                                             TREATMENT DATE:   08/27/23:checked BP, 140/68.    reassessed horizontal canals, then L and R post canals.  Had delayed response, greater than 45 sec dizziness with R post canal testing, which resolved with sitting upright . Assessed upper cervical spine with palpation, ROM, upper cervical spine rotation and found decreased R upper cervical spine rotation.  Manual techiques for upper cervical spine, including stretching, contract/ relax for R upper cervical rotation, with intermittent bouts of cervical distraction Manual stretching of upper traps B and levator scapula B with depression of scapula, also contract/ relax for B suboccipital musculature 5 sec  holds.  Pt reported improved tension, dizziness with stretching L upper traps, and had much improved R rotation once Rx completed.  08/26/23  Canalith Repositioning:   Epley Right: Number of Reps: 3, Symptoms improving however unable to complete last maneuver patient became ill so stopped.   08/22/23: Reassessed for horizontal and posterior canals with dix hal pike, and log roll, brief initial dizziness with L log roll, resolved within 2 sec. All other testing for R log roll, B dix hal pike clear. Performed one rep of BQ roll to correct for L horizontal canalithiasis.  Resolved Sx with repeat testing  Self care: Instructed in modified Epley maneuver, sitting on sofa, head turned away and into slight extension, for sidelying 30 sec each side, repeat 3 to 5 x at home if Sx persist or increase. Advised to avoid for next 2 days to evaluate response to today's Rx  08/20/23  Manual Therapy: to decrease muscle spasm and pain and improve mobility STM/TPR to cervical paraspinals, suboccipital release, NAGS into rotation Self Care: Education on findings, precautions (no big position changes, sleep on back tonight, avoid bending forward) education on anatomy Canalith Repositioning:  Epley Right: Number of Reps: 2, Symptoms resolved on right  08/13/23:  Canalith Repositioning:  Epley Right: Number of Reps: 3, improved Sx progressive   PATIENT EDUCATION: Education details: ok to resume upright activities Person educated: Patient Education method: Explanation, Demonstration, Tactile cues, and Verbal cues Education comprehension: verbalized understanding, returned demonstration, and verbal cues required  HOME EXERCISE PROGRAM:  GOALS: Goals reviewed with patient? Yes  SHORT TERM GOALS: Target date: 2 weeks   I home management of vertigo Sx Baseline: Goal status: IN PROGRESS   LONG TERM GOALS: Target date: 8 weeks to 10/08/23  DHI improve from 30 % deficit to 10% deficit Baseline:  Goal  status: IN PROGRESS  2.  DHI score 27/30 Baseline: TBD Goal status: IN PROGRESS  3.  Resolution of BPPV with positional testing Baseline:  Goal status: IN PROGRESS  ASSESSMENT:  CLINICAL IMPRESSION: Patient is a 83 y.o. female who participated in skilled physical therapy today for vertigo. No severe nausea today.  Assessment of BPPV was unremarkable today except with prolonged post canal testing had some delayed onset dizziness.  Due to her cervical spine stiffness, particularly R upper cervical spine rotation, focused more on improving neck Rom and mobility in order to address possible cervicogenic influence on her dizziness.   She did indicate improvement in her Sx after specific focus on her cervical spine.   [name] continues to demonstrate potential for improvement and would benefit from continued skilled therapy to address impairments as needed/.     OBJECTIVE IMPAIRMENTS: decreased activity tolerance, decreased balance, dizziness, and impaired perceived functional ability.   ACTIVITY LIMITATIONS: bending, sleeping, bed mobility, and locomotion level  PARTICIPATION LIMITATIONS: driving, community activity, and yard work  PERSONAL FACTORS: Age, Behavior pattern, and 1-2 comorbidities: DM, R hip pain,   are also affecting patient's functional outcome.   REHAB POTENTIAL: Good  CLINICAL DECISION MAKING: Evolving/moderate complexity  EVALUATION COMPLEXITY: Moderate   PLAN:  PT FREQUENCY: 1-2x/week  PT DURATION: 8 weeks  PLANNED INTERVENTIONS: 97110-Therapeutic exercises, 97530- Therapeutic activity, [id number]- Neuromuscular re-education, 97535- Self Care, 16109- Manual therapy, and 95992- Canalith repositioning  PLAN FOR NEXT SESSION: reassess BPPV, when able assess FGA and other balance tests   Royale Lennartz L [place] Every, PT, DPT, OCS 08/27/2023, 4:10 PM

## 2023-09-03 ENCOUNTER — Other Ambulatory Visit: Payer: Self-pay

## 2023-09-03 ENCOUNTER — Ambulatory Visit: Payer: Medicare Other

## 2023-09-03 DIAGNOSIS — R2689 Other abnormalities of gait and mobility: Secondary | ICD-10-CM

## 2023-09-03 DIAGNOSIS — H8111 Benign paroxysmal vertigo, right ear: Secondary | ICD-10-CM | POA: Diagnosis not present

## 2023-09-03 DIAGNOSIS — R262 Difficulty in walking, not elsewhere classified: Secondary | ICD-10-CM

## 2023-09-03 NOTE — Therapy (Signed)
 OUTPATIENT PHYSICAL THERAPY TREATMENT     Patient Name: [name] MRN: [id number] DOB:1941-05-31, 83 y.o., female Today's Date: 09/03/2023  END OF SESSION:  PT End of Session - 09/03/23 1452     Visit Number 6    Date for PT Re-Evaluation 10/08/23    Progress Note Due on Visit 10    PT Start Time 1400    PT Stop Time 1450    PT Time Calculation (min) 50 min    Activity Tolerance Patient tolerated treatment well    Behavior During Therapy WFL for tasks assessed/performed               Past Medical History:  Diagnosis Date   Arthritis    RIGHT KNEE PAIN AND OA   Cataract    Chest pain 05/03/2015   Diabetes mellitus without complication (HCC)    Elevated cholesterol    GERD (gastroesophageal reflux disease)    Glaucoma 2018   History of kidney stones    Hypertension    Hypothyroidism    IBS (irritable bowel syndrome)    Localized swelling of both lower legs    Rotator cuff disorder    BOTH SHOULDERS - NO SURGERY - AND STATES LIMITATIONS IN ARM MOVEMENT   Shortness of breath 05/03/2015   Swelling    CHRONIC SWELLING RIGHT HAND AND BOTH FEET AND BOTH LEGS   Vertigo    Past Surgical History:  Procedure Laterality Date   ABDOMINAL HYSTERECTOMY  1985   BREAST SURGERY  1985   RIGHT BREAST BIOPSY - BENIGN   CARPAL TUNNEL RELEASE Left 02/05/2019   Procedure: LEFT CARPAL TUNNEL RELEASE;  Surgeon: Cindee Salt, MD;  Location: [facility];  Service: Orthopedics;  Laterality: Left;   CARPAL TUNNEL RELEASE Right 07/25/2021   Procedure: RIGHT CARPAL TUNNEL RELEASE;  Surgeon: Cindee Salt, MD;  Location: [facility];  Service: Orthopedics;  Laterality: Right;  Bier block   CHOLECYSTECTOMY  1975   EYE SURGERY  1995   BILATERAL CATARACT EXTRACTIONS; SURGERY FOR MACULAR HOLE    HEMORRHOID SURGERY  1975   JOINT REPLACEMENT     PARATHYROIDECTOMY Right 04/12/2022   Procedure: RIGHT INFERIOR PARATHYROIDECTOMY;  Surgeon: Darnell Level, MD;   Location: [facility];  Service: General;  Laterality: Right;   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   Procedure: RIGHT TOTAL KNEE ARTHROPLASTY;  Surgeon: Loanne Drilling, MD;  Location: [facility];  Service: Orthopedics;  Laterality: Right;   Patient Active Problem List   Diagnosis Date Noted   Dizziness 07/28/2023   Right hip pain 07/28/2023   Hypertensive heart disease without heart failure 05/22/2023   Gastroesophageal reflux disease without esophagitis 03/31/2023   Primary insomnia 03/11/2023   Aortic atherosclerosis (HCC) 07/21/2022   Claudication (HCC) 06/26/2022   Dermatitis 05/06/2022   Erythrocytosis 05/06/2022   Swelling of lower extremity 05/06/2022   Dyslipidemia associated with type 2 diabetes mellitus (HCC) 10/28/2021   Multiple lung nodules 10/28/2021   Hypercalcemia 10/28/2021   Class 2 severe obesity due to excess calories with serious comorbidity and body mass index (BMI) of 37.0 to 37.9 in adult [facility]) 07/13/2020   Primary hypothyroidism 04/06/2018   Hyperlipidemia 04/06/2018   Essential hypertension 04/06/2018   Abnormal glucose 04/06/2018   Chest pain 05/03/2015   Shortness of breath 05/03/2015   Dizziness and giddiness 05/18/2014   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  Was sent home after they checked her.  Has MRI scheduled this week.  Reports return of vertigo this weekend, although not  as severe as previously and Pt accompanied by: self  PERTINENT HISTORY: referred by PCP for vertigo eval  PAIN:  Are you having pain? L post hip, to see Md about next week  PRECAUTIONS: Fall  RED FLAGS: None   WEIGHT BEARING RESTRICTIONS: No  FALLS: Has patient fallen in last 6 months? No  LIVING ENVIRONMENT: Lives with: lives alone Lives in: House/apartment Stairs: No Has following equipment at home: Single point cane and Environmental consultant - 2 wheeled  PLOF: Independent  PATIENT GOALS: get rid of dizziness  OBJECTIVE: CT results forwarded:  Note: Objective measures were completed at Evaluation unless otherwise noted.  DIAGNOSTIC FINDINGS: No acute intracranial process. No etiology is seen for the patient's symptoms.     Electronically Signed   By: Wiliam Ke M.D.   On: 07/31/2023 20:14  COGNITION: Overall cognitive status: Within functional limits for tasks assessed   SENSATION: WFL  EDEMA:  na  POSTURE:  rounded shoulders and forward head  Cervical ROM:    Active A/PROM (deg) eval  Flexion wfl  Extension 80  Right lateral flexion   Left lateral flexion   Right rotation 75  Left rotation 85  (Blank rows = not tested)  STRENGTH: wfl UE's  BED MOBILITY:  I all bed mobility, but dizzy once sitting upright       GAIT: Gait pattern: decreased arm swing- Right, decreased arm swing- Left, decreased step length- Right, decreased step length- Left, decreased stance time- Right, decreased stance time- Left, decreased stride length, shuffling, and trunk flexed Distance walked: 31 ' in clinic Assistive device utilized: None Level of assistance: Modified independence Comments: maintains rigid trunk, maintains stiff neck, looks forward   FUNCTIONAL TESTS:  Functional gait assessment: TBD  PATIENT SURVEYS:  DHI 30%or 30/100  VESTIBULAR ASSESSMENT:  GENERAL OBSERVATION: no head movement with conversation or with gait initially   SYMPTOM  BEHAVIOR:  Subjective history: sudden onset dizziness, saw PCP, referred for BPPV assessment by PT, initial severity of Sx has improved but still lingering dizziness and notices when lying down in bed .occurs lying down on R side   Non-Vestibular symptoms: nausea/vomiting  Type of dizziness: Spinning/Vertigo and "Funny feeling in the head"  Frequency: daily, nightly  Duration: several mins  Aggravating factors: Induced by position change: lying supine  Relieving factors: head stationary  Progression of symptoms: better  OCULOMOTOR EXAM:  Ocular Alignment: normal  Ocular ROM: No Limitations  Spontaneous Nystagmus: absent  Gaze-Induced Nystagmus: absent  Smooth Pursuits: intact  Saccades: intact  Convergence/Divergence: 1 cm    VESTIBULAR - OCULAR REFLEX:   Slow VOR: Normal  VOR Cancellation: Unable to Maintain Gaze  Head-Impulse Test: HIT Right: positive  Dynamic Visual Acuity: Not able to be assessed   POSITIONAL TESTING: Right Dix-Hallpike: upbeating, right nystagmus Right Roll Test: no nystagmus Left Roll Test: no nystagmus  MOTION SENSITIVITY:  Motion Sensitivity Quotient Intensity: 0 =  none, 1 = Lightheaded, 2 = Mild, 3 = Moderate, 4 = Severe, 5 = Vomiting  Intensity  1. Sitting to supine   2. Supine to L side   3. Supine to R side   4. Supine to sitting   5. L Hallpike-Dix   6. Up from L    7. R Hallpike-Dix   8. Up from R    9. Sitting, head tipped to L knee   10. Head up from L knee   11. Sitting, head tipped to R knee   12. Head up from R knee   13. Sitting head turns x5   14.Sitting head nods x5   15. In stance, 180 turn to L    16. In stance, 180 turn to R     OTHOSTATICS: not done  FUNCTIONAL GAIT: Functional gait assessment: TBD                                                                                                                             TREATMENT DATE:  09/03/23:  reassessed for positional vertigo, non remarkable for horizontal  canals, mild + for L dix halpike, and marked + for R dix hallpike.  Therefore utilized epley to correct for R post canalithiasis.  She did have torsional upbeating nystagmus noted by PT.  Upon sitting developed increased nausea again. , provided with ginger ale,  allowed her to rest a few mins.  Discussed staying hydrated over the next few days 08/27/23:checked BP, 140/68.    reassessed horizontal canals, then L and R post canals.  Had delayed response, greater than 45 sec dizziness with R post canal testing, which resolved with sitting upright . Assessed upper cervical spine with palpation, ROM, upper cervical spine rotation and found decreased R upper cervical spine rotation.  Manual techiques for upper cervical spine, including stretching, contract/ relax for R upper cervical rotation, with intermittent bouts of cervical distraction Manual stretching of upper traps B and levator scapula B with depression of scapula, also contract/ relax for B suboccipital musculature 5 sec holds.  Pt reported improved tension, dizziness with stretching L upper traps, and had much improved R rotation once Rx completed.  08/26/23  Canalith Repositioning:   Epley Right: Number of Reps: 3, Symptoms improving however unable to complete last maneuver patient became ill so stopped.   08/22/23: Reassessed for horizontal and posterior canals with dix hal pike, and log roll, brief initial dizziness with L log roll, resolved within 2 sec. All other testing for R log roll, B dix hal pike clear. Performed one rep of BQ roll to correct for L horizontal canalithiasis.  Resolved Sx with repeat testing  Self care: Instructed in modified Epley maneuver, sitting on sofa, head turned away and into slight extension, for sidelying 30 sec each side, repeat 3 to 5 x at home if Sx persist or increase. Advised to avoid for next 2 days to evaluate response to today's Rx  08/20/23  Manual Therapy: to decrease muscle spasm and pain and improve  mobility STM/TPR to cervical paraspinals, suboccipital release, NAGS into rotation Self Care: Education on findings, precautions (no big position changes, sleep on back tonight, avoid bending forward) education on anatomy Canalith Repositioning:  Epley Right: Number of Reps: 2, Symptoms resolved on right  08/13/23:  Canalith Repositioning:  Epley Right: Number of Reps: 3, improved Sx progressive   PATIENT EDUCATION: Education details: ok to resume upright activities Person educated: Patient Education method: Explanation, Demonstration, Tactile cues, and Verbal cues Education comprehension: verbalized understanding, returned demonstration, and verbal cues required  HOME EXERCISE PROGRAM:  GOALS: Goals reviewed with patient? Yes  SHORT TERM GOALS: Target date: 2 weeks   I home management of vertigo Sx Baseline: Goal status: IN PROGRESS   LONG TERM GOALS: Target date: 8 weeks to 10/08/23  DHI improve from 30 % deficit to 10% deficit Baseline:  Goal status: IN PROGRESS  2.  DHI score 27/30 Baseline: TBD Goal status: IN PROGRESS  3.  Resolution of BPPV with positional testing Baseline:  Goal status: IN PROGRESS    ASSESSMENT:  CLINICAL IMPRESSION: Patient is a 83 y.o. female who participated in skilled physical therapy today for vertigo. Return of vertigo over the weekend as well as R sided face pain /temporal pain which she had several months ago, and went to ER.  Today did demonstrate torsional nystagmus and + BPPv with L and R Dix Hallpike but more pronounced with R.  We determined to await results of her MRI Thursday due to her lingering Sx,and she will follow up with [place] as needed.  JOCELINE HINCHCLIFF continues to demonstrate potential for improvement and would benefit from continued skilled therapy to address impairments as needed/.     OBJECTIVE IMPAIRMENTS: decreased activity tolerance, decreased balance, dizziness, and impaired perceived functional ability.   ACTIVITY  LIMITATIONS: bending, sleeping, bed mobility, and locomotion level  PARTICIPATION LIMITATIONS: driving, community activity, and yard work  PERSONAL FACTORS: Age, Behavior pattern, and 1-2 comorbidities: DM, R hip pain,   are also affecting patient's functional outcome.   REHAB POTENTIAL: Good  CLINICAL DECISION MAKING: Evolving/moderate complexity  EVALUATION COMPLEXITY: Moderate   PLAN:  PT FREQUENCY: 1-2x/week  PT DURATION: 8 weeks  PLANNED INTERVENTIONS: 97110-Therapeutic exercises, 97530- Therapeutic activity, [id number]- Neuromuscular re-education, 97535- Self Care, [id number]- Manual therapy, and [id number]- Canalith repositioning  PLAN FOR NEXT SESSION: await MRI, hold PT for a month, resume if she calls   Kym Fenter L Cierra Rothgeb, PT, DPT, OCS 09/03/2023, 2:55 PM

## 2023-09-05 ENCOUNTER — Ambulatory Visit
Admission: RE | Admit: 2023-09-05 | Discharge: 2023-09-05 | Disposition: A | Discharge status: Home / Self Care | Payer: Medicare Other | Source: Ambulatory Visit | Attending: Nurse Practitioner | Admitting: Nurse Practitioner

## 2023-09-05 ENCOUNTER — Encounter: Payer: Medicare Other | Admitting: Physical Therapy

## 2023-09-05 DIAGNOSIS — R42 Dizziness and giddiness: Secondary | ICD-10-CM

## 2023-09-05 MED ORDER — GADOPICLENOL 0.5 MMOL/ML IV SOLN
10.0000 mL | Freq: Once | INTRAVENOUS | Status: AC | PRN
  Administered 2023-09-05: 10 mL via INTRAVENOUS

## 2023-09-09 ENCOUNTER — Other Ambulatory Visit: Payer: Self-pay | Admitting: Nurse Practitioner

## 2023-09-09 DIAGNOSIS — K118 Other diseases of salivary glands: Secondary | ICD-10-CM

## 2023-09-11 ENCOUNTER — Ambulatory Visit: Payer: Self-pay | Admitting: Internal Medicine

## 2023-09-12 ENCOUNTER — Ambulatory Visit: Payer: Self-pay | Admitting: Nurse Practitioner

## 2023-10-03 ENCOUNTER — Institutional Professional Consult (permissible substitution) (INDEPENDENT_AMBULATORY_CARE_PROVIDER_SITE_OTHER): Admitting: Otolaryngology

## 2023-10-04 ENCOUNTER — Other Ambulatory Visit: Payer: Self-pay

## 2023-10-04 DIAGNOSIS — E1169 Type 2 diabetes mellitus with other specified complication: Secondary | ICD-10-CM

## 2023-10-04 MED ORDER — ONETOUCH DELICA PLUS LANCET33G MISC: 3 refills | Status: DC

## 2023-10-11 ENCOUNTER — Other Ambulatory Visit: Payer: Self-pay | Admitting: Internal Medicine

## 2023-10-23 ENCOUNTER — Encounter: Payer: Self-pay | Admitting: Internal Medicine

## 2023-10-23 ENCOUNTER — Ambulatory Visit (INDEPENDENT_AMBULATORY_CARE_PROVIDER_SITE_OTHER): Payer: Medicare Other | Admitting: Internal Medicine

## 2023-10-23 VITALS — BP 124/62 | HR 79 | Temp 97.6°F | Ht 63.0 in | Wt 216.8 lb

## 2023-10-23 DIAGNOSIS — I119 Hypertensive heart disease without heart failure: Secondary | ICD-10-CM

## 2023-10-23 DIAGNOSIS — Z6838 Body mass index (BMI) 38.0-38.9, adult: Secondary | ICD-10-CM

## 2023-10-23 DIAGNOSIS — K219 Gastro-esophageal reflux disease without esophagitis: Secondary | ICD-10-CM

## 2023-10-23 DIAGNOSIS — E1169 Type 2 diabetes mellitus with other specified complication: Secondary | ICD-10-CM

## 2023-10-23 DIAGNOSIS — I700 Atherosclerosis of aorta: Secondary | ICD-10-CM | POA: Diagnosis not present

## 2023-10-23 DIAGNOSIS — E66812 Obesity, class 2: Secondary | ICD-10-CM

## 2023-10-23 DIAGNOSIS — Z0000 Encounter for general adult medical examination without abnormal findings: Secondary | ICD-10-CM | POA: Diagnosis not present

## 2023-10-23 DIAGNOSIS — E785 Hyperlipidemia, unspecified: Secondary | ICD-10-CM

## 2023-10-23 DIAGNOSIS — H8110 Benign paroxysmal vertigo, unspecified ear: Secondary | ICD-10-CM

## 2023-10-23 HISTORY — DX: Encounter for general adult medical examination without abnormal findings: Z00.00

## 2023-10-23 LAB — POCT URINALYSIS DIPSTICK
Bilirubin, UA: NEGATIVE
Glucose, UA: NEGATIVE
Ketones, UA: NEGATIVE
Leukocytes, UA: NEGATIVE
Nitrite, UA: NEGATIVE
Protein, UA: NEGATIVE
Spec Grav, UA: 1.02 (ref 1.010–1.025)
Urobilinogen, UA: 0.2 U/dL
pH, UA: 5.5 (ref 5.0–8.0)

## 2023-10-23 MED ORDER — CRESTOR 20 MG PO TABS: ORAL_TABLET | ORAL | 3 refills | Status: DC

## 2023-10-23 MED ORDER — OLMESARTAN MEDOXOMIL 40 MG PO TABS: 40.0000 mg | ORAL_TABLET | Freq: Every day | ORAL | 3 refills | Status: DC

## 2023-10-23 MED ORDER — FUROSEMIDE 20 MG PO TABS: 20.0000 mg | ORAL_TABLET | Freq: Every morning | ORAL | 3 refills | Status: AC

## 2023-10-23 MED ORDER — MECLIZINE HCL 12.5 MG PO TABS: 12.5000 mg | ORAL_TABLET | Freq: Three times a day (TID) | ORAL | 0 refills | Status: DC | PRN

## 2023-10-23 NOTE — Progress Notes (Signed)
 I,[name], CMA,acting as a neurosurgeon for [name], MD.,have documented all relevant documentation on the behalf of [name], MD,as directed by  [name], MD while in the presence of [name], MD.  Subjective:    Patient ID: [name] , female    DOB: [birth year] , 83 y.o.   MRN: [id number]  Chief Complaint  Patient presents with   Annual Exam    Patient presents today for annual exams. She reports compliance with medications. Denies headache, chest pain & sob. She reports experiencing frequent dizziness, that has been ongoing for 3-4 months. Orthostatics performed.   Hypertension   Diabetes   Hyperlipidemia    HPI Discussed the use of AI scribe software for clinical note transcription with the patient, who gave verbal consent to proceed.  History of Present Illness [name] is an 83 year old female who presents with persistent dizziness.  She experiences dizziness characterized by a sensation of the room spinning, which occurs when she moves around or lies down. This has been a persistent issue, and she has not been able to alleviate it. She previously underwent therapy for vertigo but has not been maintaining the exercises at home.  She has a history of neck and ear pain, for which she was treated by an ENT specialist. 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 She was referred for physical therapy, which she is currently attending at [facility] on [facility] in [place]. However, she is unsure if this therapy is addressing her dizziness.  Her current medications include a baby aspirin, olmesartan  once a day, Lumigan eye drops, Crestor  for cholesterol, famotidine with breakfast, Lasix  every morning, and metformin  twice a day. She previously took spironolactone  but stopped due to low blood pressure concerns. She monitors her blood pressure at home and reports it as stable.  No issues with  allergies or clearing her throat in the morning. Her bowels are good, and she drinks plenty of water. She experiences dizziness when lying down.     Diabetes She presents for her follow-up diabetic visit. She has type 2 diabetes mellitus. Her disease course has been stable. Hypoglycemia symptoms include dizziness. There are no diabetic associated symptoms. Pertinent negatives for diabetes include no blurred vision, no polydipsia, no polyphagia and no polyuria. There are no hypoglycemic complications. Risk factors for coronary artery disease include diabetes mellitus, dyslipidemia, hypertension, obesity, post-menopausal and sedentary lifestyle. She participates in exercise intermittently. Eye exam is current.  Hypertension This is a chronic problem. The current episode started more than 1 year ago. The problem has been gradually improving since onset. The problem is controlled. Pertinent negatives include no blurred vision. Agents associated with hypertension include thyroid  hormones. Risk factors for coronary artery disease include dyslipidemia, obesity, post-menopausal state and sedentary lifestyle. Past treatments include angiotensin blockers. The current treatment provides moderate improvement. Compliance problems include exercise.      Past Medical History:  Diagnosis Date   Arthritis    RIGHT KNEE PAIN AND OA   Cataract    Chest pain 05/03/2015   Diabetes mellitus without complication (HCC)    Elevated cholesterol    Encounter for general adult medical examination w/o abnormal findings 10/23/2023   GERD (gastroesophageal reflux disease)    Glaucoma 2018   History of kidney stones    Hypertension    Hypothyroidism    IBS (irritable bowel syndrome)  Localized swelling of both lower legs    Rotator cuff disorder    BOTH SHOULDERS - NO SURGERY - AND STATES LIMITATIONS IN ARM MOVEMENT   Shortness of breath 05/03/2015   Swelling    CHRONIC SWELLING RIGHT HAND AND BOTH FEET AND BOTH  LEGS   Vertigo      Family History  Problem Relation Age of Onset   Diabetes Mother    Dementia Mother    Diabetes Father    Diabetes Sister    Breast cancer Sister    Kidney cancer Sister    Cancer Sister    Lung cancer Sister    Lung cancer Maternal Aunt    Pancreatic cancer Maternal Aunt    Cancer Other    Cancer Sister    Diabetes Sister      Current Outpatient Medications:    aspirin EC 81 MG tablet, Take 81 mg by mouth daily. Swallow whole., Disp: , Rfl:    bimatoprost (LUMIGAN) 0.01 % SOLN, Place 1 drop into both eyes at bedtime. , Disp: , Rfl:    Blood Glucose Monitoring Suppl (ONETOUCH VERIO FLEX SYSTEM) w/Device KIT, USE TO CHECK BLOOD SUGAR 3  TIMES DAILY, Disp: 1 kit, Rfl: 1   Carboxymethylcellulose Sodium (REFRESH LIQUIGEL) 1 % GEL, Place 1 drop into both eyes at bedtime., Disp: , Rfl:    famotidine (PEPCID) 20 MG tablet, Take 20 mg by mouth daily with breakfast., Disp: , Rfl:    Lancets (ONETOUCH DELICA PLUS LANCET33G) MISC, USE LANCETS ONCE DAILY TO CHECK BLOOD SUGARS., Disp: 300 each, Rfl: 3   Lifitegrast (XIIDRA) 5 % SOLN, Place 1 drop into both eyes daily. xiidra, Disp: , Rfl:    magnesium  oxide (MAG-OX) 400 (240 Mg) MG tablet, TAKE 1 TABLET BY MOUTH EVERY DAY AFTER SUPPER, Disp: 90 tablet, Rfl: 1   meclizine  (ANTIVERT ) 12.5 MG tablet, Take 1 tablet (12.5 mg total) by mouth 3 (three) times daily as needed for dizziness., Disp: 30 tablet, Rfl: 0   metFORMIN  (GLUCOPHAGE -XR) 500 MG 24 hr tablet, TAKE 1 TABLET BY MOUTH TWICE  DAILY WITH A MEAL, Disp: 180 tablet, Rfl: 3   Multiple Vitamins-Minerals (MULTIVITAMIN WITH MINERALS) tablet, Take 1 tablet by mouth daily. Centrum Silver, Disp: , Rfl:    nystatin  powder, Apply 1 Application topically 3 (three) times daily., Disp: 45 g, Rfl: 0   ONETOUCH VERIO test strip, USE AS INSTRUCTED TO CHECK BLOOD SUGAR 3 TIMES DAILY, Disp: 300 strip, Rfl: 3   polyethylene glycol (MIRALAX  / GLYCOLAX ) packet, Take 17 g by mouth daily.,  Disp: , Rfl:    SYNTHROID  50 MCG tablet, TAKE 1 TABLET BY MOUTH DAILY, Disp: 90 tablet, Rfl: 3   traZODone  (DESYREL ) 50 MG tablet, Take 1 tablet (50 mg total) by mouth at bedtime., Disp: 30 tablet, Rfl: 1   triamcinolone  cream (KENALOG ) 0.1 %, APPLY TO AFFECTED AREA TWICE DAILY AS NEEDED, Disp: 45 g, Rfl: 0   vitamin B-6 (PYRIDOXINE) 25 MG tablet, Take 25 mg by mouth daily., Disp: , Rfl:    CRESTOR  20 MG tablet, Take 1 tablet by mouth at bedtime, Disp: 90 tablet, Rfl: 3   furosemide  (LASIX ) 20 MG tablet, Take 1 tablet (20 mg total) by mouth every morning., Disp: 90 tablet, Rfl: 3   olmesartan  (BENICAR ) 40 MG tablet, Take 1 tablet (40 mg total) by mouth daily., Disp: 90 tablet, Rfl: 3   spironolactone  (ALDACTONE ) 25 MG tablet, Take 1 tablet (25 mg total) by mouth every morning.,  Disp: 30 tablet, Rfl: 2   Allergies  Allergen Reactions   Codeine Nausea And Vomiting   Tramadol Nausea And Vomiting   Macrobid [Nitrofurantoin Macrocrystal] Rash      The patient states she uses post menopausal status for birth control. No LMP recorded. Patient has had a hysterectomy.. Negative for Dysmenorrhea. Negative for: breast discharge, breast lump(s), breast pain and breast self exam. Associated symptoms include abnormal vaginal bleeding. Pertinent negatives include abnormal bleeding (hematology), anxiety, decreased libido, depression, difficulty falling sleep, dyspareunia, history of infertility, nocturia, sexual dysfunction, sleep disturbances, urinary incontinence, urinary urgency, vaginal discharge and vaginal itching. Diet regular.The patient states her exercise level is  intermittent.  . The patient's tobacco use is:  Social History   Tobacco Use  Smoking Status Never  Smokeless Tobacco Never  . She has been exposed to passive smoke. The patient's alcohol use is:  Social History   Substance and Sexual Activity  Alcohol Use Yes   Comment: Rare    Review of Systems  Constitutional: Negative.    HENT: Negative.    Eyes: Negative.  Negative for blurred vision.  Respiratory: Negative.    Cardiovascular: Negative.   Gastrointestinal: Negative.   Endocrine: Negative.  Negative for polydipsia, polyphagia and polyuria.  Genitourinary: Negative.   Musculoskeletal: Negative.   Skin: Negative.   Allergic/Immunologic: Negative.   Neurological:  Positive for dizziness.  Hematological: Negative.   Psychiatric/Behavioral: Negative.       Today's Vitals   10/23/23 1114 10/23/23 1128 10/23/23 1129 10/23/23 1131  BP: 120/68 110/64 120/64 124/62  Pulse: 73 (!) 52 79 79  Temp: 97.6 F (36.4 C)     SpO2: 98%     Weight: 216 lb 12.8 oz (98.3 kg)     Height: 5' 3 (1.6 m)      Body mass index is 38.4 kg/m.  Wt Readings from Last 3 Encounters:  10/23/23 216 lb 12.8 oz (98.3 kg)  07/23/23 211 lb (95.7 kg)  07/16/23 211 lb 9.6 oz (96 kg)     Objective:  Physical Exam Vitals and nursing note reviewed.  Constitutional:      Appearance: Normal appearance. She is obese.  HENT:     Head: Normocephalic and atraumatic.     Right Ear: Tympanic membrane, ear canal and external ear normal. There is no impacted cerumen.     Left Ear: Tympanic membrane, ear canal and external ear normal. There is no impacted cerumen.     Mouth/Throat:     Mouth: Mucous membranes are dry.     Pharynx: Oropharynx is clear.  Eyes:     Extraocular Movements: Extraocular movements intact.  Cardiovascular:     Rate and Rhythm: Normal rate and regular rhythm.     Pulses:          Dorsalis pedis pulses are 2+ on the right side and 2+ on the left side.     Heart sounds: Normal heart sounds.  Pulmonary:     Effort: Pulmonary effort is normal.     Breath sounds: Normal breath sounds.  Chest:  Breasts:    Tanner Score is 5.     Right: Normal.     Left: Normal.  Abdominal:     General: Bowel sounds are normal.     Palpations: Abdomen is soft.     Comments: Obese, soft  Genitourinary:    Comments: Deferred   Musculoskeletal:     Cervical back: Normal range of motion.     Right  lower leg: Edema present.     Left lower leg: Edema present.  Feet:     Right foot:     Protective Sensation: 5 sites tested.  5 sites sensed.     Skin integrity: Dry skin present.     Left foot:     Protective Sensation: 5 sites tested.  5 sites sensed.     Skin integrity: Dry skin present.     Comments: Scaly skin b/w toes Skin:    General: Skin is warm.     Comments: Scattered DPNs, seborrheic keratoses  Neurological:     General: No focal deficit present.     Mental Status: She is alert.  Psychiatric:        Mood and Affect: Mood normal.        Behavior: Behavior normal.       Assessment And Plan:     Encounter for general adult medical examination w/o abnormal findings Assessment & Plan: A full exam was performed.  Importance of monthly self breast exams was discussed with the patient.  She is advised to get 30-45 minutes of regular exercise, no less than four to five days per week. Both weight-bearing and aerobic exercises are recommended.  She is advised to follow a healthy diet with at least six fruits/veggies per day, decrease intake of red meat and other saturated fats and to increase fish intake to twice weekly.  Meats/fish should not be fried -- baked, boiled or broiled is preferable. It is also important to cut back on your sugar intake.  Be sure to read labels - try to avoid anything with added sugar, high fructose corn syrup or other sweeteners.  If you must use a sweetener, you can try stevia or monkfruit.  It is also important to avoid artificially sweetened foods/beverages and diet drinks. Lastly, wear SPF 50 sunscreen on exposed skin and when in direct sunlight for an extended period of time.  Be sure to avoid fast food restaurants and aim for at least 60 ounces of water daily.       Hypertensive heart disease without heart failure Assessment & Plan: Chronic, controlled. 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 Voltage criteria for LVH.  Hypertension managed with olmesartan . Reports low energy with spironolactone , previously prescribed but not currently taking. Home blood pressure readings are well-controlled. Consumes salty foods, potentially affecting blood pressure. Discussed reintroducing spironolactone  at a lower dose for swelling, especially in summer. - Refill olmesartan  prescription through [facility]. - Consider reintroducing spironolactone  at a lower dose after reviewing lab results, using a pill cutter if necessary. - Advise monitoring blood pressure at home and reducing salt intake  Orders: -     CMP14+EGFR -     Lipid panel  Aortic atherosclerosis (HCC) Assessment & Plan: Chronic, LDL goal <70. She will c/w ASA 81mg  and rosuvastatin  20mg  daily.  She is encouraged to follow a heart healthy lifestyle.   Orders: -     Lipid panel  Dyslipidemia associated with type 2 diabetes mellitus (HCC) Assessment & Plan: Chronic, LDL goal is less than 70.  She will continue with statin therapy. Diabetic foot exam was performed. She will continue with metformin . She has not tolerated injectable GLP-1 agonists or SGLT2i. May need to consider Rybelsus .  I DISCUSSED WITH THE PATIENT AT LENGTH REGARDING THE GOALS OF GLYCEMIC CONTROL AND POSSIBLE LONG-TERM COMPLICATIONS.  I  ALSO STRESSED THE IMPORTANCE OF COMPLIANCE WITH HOME GLUCOSE MONITORING, DIETARY RESTRICTIONS  INCLUDING AVOIDANCE OF SUGARY DRINKS/PROCESSED FOODS,  ALONG WITH REGULAR EXERCISE.  I  ALSO STRESSED THE IMPORTANCE OF ANNUAL EYE EXAMS, SELF FOOT CARE AND COMPLIANCE WITH OFFICE VISITS.   Orders: -     POCT urinalysis dipstick -     Microalbumin / creatinine urine ratio -     EKG 12-Lead -     CMP14+EGFR -     Lipid panel -     Hemoglobin A1c  Gastroesophageal reflux disease without esophagitis Assessment & Plan: She is reminded to stop eating 3 hours prior to lying down and to  avoid known triggers. She is reminded to continue with famotidine daily, sx currently controlled.    Benign paroxysmal positional vertigo, unspecified laterality Assessment & Plan: Chronic, intermittent. I will refill meclizine  as requested. I will also refer her to PT. Will check to see if I can amend her current PT orders.   Chronic dizziness with vertigo exacerbated by movement or lying down. ENT specialist focused on neck and ear pain, not dizziness. Undergoing physical therapy for mobility, not vertigo. Meclizine  discussed as a potential treatment with caution for drowsiness. - Inform ENT specialist and therapist about dizziness for appropriate vertigo therapy. - Consider prescribing meclizine  for vertigo, cautioning about drowsiness.  Orders: -     Ambulatory referral to Physical Therapy  Class 2 severe obesity due to excess calories with serious comorbidity and body mass index (BMI) of 38.0 to 38.9 in adult [facility]) Assessment & Plan: She is encouraged to aim for at least 150 minutes of exercise per week. Advised to strive to lose ten percent of her body weight.    Other orders -     Olmesartan  Medoxomil; Take 1 tablet (40 mg total) by mouth daily.  Dispense: 90 tablet; Refill: 3 -     Crestor ; Take 1 tablet by mouth at bedtime  Dispense: 90 tablet; Refill: 3 -     Furosemide ; Take 1 tablet (20 mg total) by mouth every morning.  Dispense: 90 tablet; Refill: 3 -     Meclizine  HCl; Take 1 tablet (12.5 mg total) by mouth 3 (three) times daily as needed for dizziness.  Dispense: 30 tablet; Refill: 0    Return for 1 year HM, 4 month dm f/u. SABRA Patient was given opportunity to ask questions. Patient verbalized understanding of the plan and was able to repeat key elements of the plan. All questions were answered to their satisfaction.   I, [name], MD, have reviewed all documentation for this visit. The documentation on 10/24/23 for the exam, diagnosis, procedures, and orders are  all accurate and complete.

## 2023-10-23 NOTE — Patient Instructions (Signed)

## 2023-10-24 DIAGNOSIS — H8110 Benign paroxysmal vertigo, unspecified ear: Secondary | ICD-10-CM | POA: Insufficient documentation

## 2023-10-24 LAB — LIPID PANEL
Chol/HDL Ratio: 2.5 ratio (ref 0.0–4.4)
Cholesterol, Total: 155 mg/dL (ref 100–199)
HDL: 62 mg/dL (ref >39)
LDL Chol Calc (NIH): 77 mg/dL (ref 0–99)
Triglycerides: 87 mg/dL (ref 0–149)
VLDL Cholesterol Cal: 16 mg/dL (ref 5–40)

## 2023-10-24 LAB — CMP14+EGFR
ALT: 24 IU/L (ref 0–32)
AST: 27 IU/L (ref 0–40)
Albumin: 3.9 g/dL (ref 3.7–4.7)
Alkaline Phosphatase: 85 IU/L (ref 44–121)
BUN/Creatinine Ratio: 22 (ref 12–28)
BUN: 14 mg/dL (ref 8–27)
Bilirubin Total: 0.5 mg/dL (ref 0.0–1.2)
CO2: 25 mmol/L (ref 20–29)
Calcium: 9.5 mg/dL (ref 8.7–10.3)
Chloride: 105 mmol/L (ref 96–106)
Creatinine, Ser: 0.64 mg/dL (ref 0.57–1.00)
Globulin, Total: 1.9 g/dL (ref 1.5–4.5)
Glucose: 107 mg/dL — ABNORMAL HIGH (ref 70–99)
Potassium: 4.3 mmol/L (ref 3.5–5.2)
Sodium: 143 mmol/L (ref 134–144)
Total Protein: 5.8 g/dL — ABNORMAL LOW (ref 6.0–8.5)
eGFR: 87 mL/min/{1.73_m2} (ref >59)

## 2023-10-24 LAB — MICROALBUMIN / CREATININE URINE RATIO
Creatinine, Urine: 56.5 mg/dL
Microalb/Creat Ratio: 19 mg/g{creat} (ref 0–29)
Microalbumin, Urine: 11 ug/mL

## 2023-10-24 LAB — HEMOGLOBIN A1C
Est. average glucose Bld gHb Est-mCnc: 151 mg/dL
Hgb A1c MFr Bld: 6.9 % — ABNORMAL HIGH (ref 4.8–5.6)

## 2023-10-24 NOTE — Assessment & Plan Note (Signed)
 She is reminded to stop eating 3 hours prior to lying down and to avoid known triggers. She is reminded to continue with famotidine daily, sx currently controlled.

## 2023-10-24 NOTE — Assessment & Plan Note (Signed)

## 2023-10-24 NOTE — Assessment & Plan Note (Signed)
 Chronic, LDL goal <70. She will c/w ASA 81mg  and rosuvastatin 20mg  daily.  She is encouraged to follow a heart healthy lifestyle.

## 2023-10-24 NOTE — Assessment & Plan Note (Signed)
 Chronic, controlled. 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 Voltage criteria for LVH.  Hypertension managed with olmesartan. Reports low energy with spironolactone, previously prescribed but not currently taking. Home blood pressure readings are well-controlled. Consumes salty foods, potentially affecting blood pressure. Discussed reintroducing spironolactone at a lower dose for swelling, especially in summer. - Refill olmesartan prescription through [facility]. - Consider reintroducing spironolactone at a lower dose after reviewing lab results, using a pill cutter if necessary. - Advise monitoring blood pressure at home and reducing salt intake

## 2023-10-24 NOTE — Assessment & Plan Note (Signed)
 Chronic, LDL goal is less than 70.  She will continue with statin therapy. Diabetic foot exam was performed. She will continue with metformin. She has not tolerated injectable GLP-1 agonists or SGLT2i. May need to consider Rybelsus.  I DISCUSSED WITH THE PATIENT AT LENGTH REGARDING THE GOALS OF GLYCEMIC CONTROL AND POSSIBLE LONG-TERM COMPLICATIONS.  I  ALSO STRESSED THE IMPORTANCE OF COMPLIANCE WITH HOME GLUCOSE MONITORING, DIETARY RESTRICTIONS INCLUDING AVOIDANCE OF SUGARY DRINKS/PROCESSED FOODS,  ALONG WITH REGULAR EXERCISE.  I  ALSO STRESSED THE IMPORTANCE OF ANNUAL EYE EXAMS, SELF FOOT CARE AND COMPLIANCE WITH OFFICE VISITS.

## 2023-10-24 NOTE — Assessment & Plan Note (Signed)
 She is encouraged to aim for at least 150 minutes of exercise per week. Advised to strive to lose ten percent of her body weight.

## 2023-10-24 NOTE — Assessment & Plan Note (Signed)
 Chronic, intermittent. I will refill meclizine as requested. I will also refer her to PT. Will check to see if I can amend her current PT orders.   Chronic dizziness with vertigo exacerbated by movement or lying down. ENT specialist focused on neck and ear pain, not dizziness. Undergoing physical therapy for mobility, not vertigo. Meclizine discussed as a potential treatment with caution for drowsiness. - Inform ENT specialist and therapist about dizziness for appropriate vertigo therapy. - Consider prescribing meclizine for vertigo, cautioning about drowsiness.

## 2023-10-26 ENCOUNTER — Encounter: Payer: Self-pay | Admitting: Internal Medicine

## 2023-11-15 ENCOUNTER — Other Ambulatory Visit: Payer: Self-pay | Admitting: Internal Medicine

## 2023-12-30 LAB — HM MAMMOGRAPHY

## 2024-02-13 LAB — HM DIABETES EYE EXAM

## 2024-02-27 ENCOUNTER — Encounter: Payer: Self-pay | Admitting: Internal Medicine

## 2024-02-27 ENCOUNTER — Ambulatory Visit (INDEPENDENT_AMBULATORY_CARE_PROVIDER_SITE_OTHER): Admitting: Internal Medicine

## 2024-02-27 VITALS — BP 110/70 | HR 74 | Temp 97.9°F | Ht 63.0 in | Wt 217.6 lb

## 2024-02-27 DIAGNOSIS — I700 Atherosclerosis of aorta: Secondary | ICD-10-CM | POA: Diagnosis not present

## 2024-02-27 DIAGNOSIS — Z6838 Body mass index (BMI) 38.0-38.9, adult: Secondary | ICD-10-CM

## 2024-02-27 DIAGNOSIS — E1169 Type 2 diabetes mellitus with other specified complication: Secondary | ICD-10-CM | POA: Diagnosis not present

## 2024-02-27 DIAGNOSIS — R918 Other nonspecific abnormal finding of lung field: Secondary | ICD-10-CM

## 2024-02-27 DIAGNOSIS — M85852 Other specified disorders of bone density and structure, left thigh: Secondary | ICD-10-CM

## 2024-02-27 DIAGNOSIS — E785 Hyperlipidemia, unspecified: Secondary | ICD-10-CM | POA: Diagnosis not present

## 2024-02-27 DIAGNOSIS — M25511 Pain in right shoulder: Secondary | ICD-10-CM

## 2024-02-27 DIAGNOSIS — I119 Hypertensive heart disease without heart failure: Secondary | ICD-10-CM | POA: Diagnosis not present

## 2024-02-27 DIAGNOSIS — M7989 Other specified soft tissue disorders: Secondary | ICD-10-CM

## 2024-02-27 DIAGNOSIS — E039 Hypothyroidism, unspecified: Secondary | ICD-10-CM

## 2024-02-27 DIAGNOSIS — G8929 Other chronic pain: Secondary | ICD-10-CM

## 2024-02-27 DIAGNOSIS — H6122 Impacted cerumen, left ear: Secondary | ICD-10-CM | POA: Diagnosis not present

## 2024-02-27 DIAGNOSIS — E66812 Obesity, class 2: Secondary | ICD-10-CM

## 2024-02-27 DIAGNOSIS — H8110 Benign paroxysmal vertigo, unspecified ear: Secondary | ICD-10-CM

## 2024-02-27 MED ORDER — SYNTHROID 50 MCG PO TABS: 50.0000 ug | ORAL_TABLET | Freq: Every day | ORAL | Status: AC

## 2024-02-27 MED ORDER — TRAZODONE HCL 50 MG PO TABS: 50.0000 mg | ORAL_TABLET | Freq: Every day | ORAL | 1 refills | Status: DC

## 2024-02-27 MED ORDER — CRESTOR 20 MG PO TABS: 20.0000 mg | ORAL_TABLET | Freq: Every day | ORAL | Status: AC

## 2024-02-27 MED ORDER — OLMESARTAN MEDOXOMIL 40 MG PO TABS: 40.0000 mg | ORAL_TABLET | Freq: Every day | ORAL | Status: AC

## 2024-02-27 NOTE — Progress Notes (Signed)
 I,[name], CMA,acting as a Neurosurgeon for Stephanie LOISE Slocumb, MD.,have documented all relevant documentation on the behalf of Stephanie LOISE Slocumb, MD,as directed by  Stephanie LOISE Slocumb, MD while in the presence of Stephanie LOISE Slocumb, MD.  Subjective:  Patient ID: [name] , female    DOB: 12-11-1940 , 83 y.o.   MRN: [id number]  Chief Complaint  Patient presents with   Hypertension    Patient presents today for bp & dm follow up. She reports compliance with medications. Denies headache, chest pain & sob. While here today she reports experiencing dizziness when she does her water aerobics.  Also she experiences pain in her right arm. Started 2-3 weeks ago. Radiates down to her hand, which appears swollen. She takes tylenol  at home.  Letter sent to Dr Cyrus for DM eye exam.    Diabetes    HPI Discussed the use of AI scribe software for clinical note transcription with the patient, who gave verbal consent to proceed.  History of Present Illness [name] is an 83 year old female with diabetes and hypertension who presents for a diabetes and blood pressure check.  Her blood sugar levels have been elevated over the past few weeks, with readings in the high 130s to 140s, and a reading of 157 this morning. She attributes the increase to eating snacks before bed and consuming sweets. She is currently taking metformin  twice a day for her diabetes.  She experiences dizziness, particularly during water aerobics, which has persisted for almost a year. She previously attended physical therapy for dizziness, which was beneficial, but she has not continued the exercises at home. She believes she still has 'crystals up in there' and requires more treatments. She discontinued physical therapy due to insurance issues and lack of follow-up from the therapy center.  She is taking several medications including aspirin, olmesartan  (Benicar ), rosuvastatin  (Crestor ), Lasix , spironolactone , and a thyroid   medication (Centrol 50 mg). She has not been taking trazodone  recently but plans to resume it for sleep issues.  She reports pain in her right shoulder and arm, affecting her ability to perform tasks like oiling her hair. She has an appointment with a hand doctor in September and has a history of Copaxone injections in both hands. She cannot make a fist with her right hand and experiences swelling and warmth.  She is due for a bone density test and has a history of osteopenia in her left hip. She also needs to follow up on lung nodules identified in a previous CT scan of her chest.  She plans to get her flu and COVID shots [facility].   Diabetes She presents for her follow-up diabetic visit. She has type 2 diabetes mellitus. Her disease course has been stable. There are no hypoglycemic associated symptoms. There are no diabetic associated symptoms. Pertinent negatives for diabetes include no blurred vision, no polydipsia, no polyphagia and no polyuria. There are no hypoglycemic complications. Risk factors for coronary artery disease include diabetes mellitus, dyslipidemia, hypertension, obesity, post-menopausal and sedentary lifestyle. She participates in exercise intermittently. Eye exam is current.  Hypertension This is a chronic problem. The current episode started more than 1 year ago. The problem has been gradually improving since onset. The problem is controlled. Pertinent negatives include no blurred vision. Agents associated with hypertension include thyroid  hormones. Risk factors for coronary artery disease include dyslipidemia, obesity, post-menopausal state and sedentary lifestyle. Past treatments include angiotensin blockers. The current treatment provides  moderate improvement. Compliance problems include exercise.      Past Medical History:  Diagnosis Date   Arthritis    RIGHT KNEE PAIN AND OA   Cataract    Chest pain 05/03/2015   Diabetes mellitus without  complication (HCC)    Elevated cholesterol    Encounter for general adult medical examination w/o abnormal findings 10/23/2023   GERD (gastroesophageal reflux disease)    Glaucoma 2018   History of kidney stones    Hypertension    Hypothyroidism    IBS (irritable bowel syndrome)    Localized swelling of both lower legs    Rotator cuff disorder    BOTH SHOULDERS - NO SURGERY - AND STATES LIMITATIONS IN ARM MOVEMENT   Shortness of breath 05/03/2015   Swelling    CHRONIC SWELLING RIGHT HAND AND BOTH FEET AND BOTH LEGS   Vertigo      Family History  Problem Relation Age of Onset   Diabetes Mother    Dementia Mother    Diabetes Father    Diabetes Sister    Breast cancer Sister    Kidney cancer Sister    Cancer Sister    Lung cancer Sister    Lung cancer Maternal Aunt    Pancreatic cancer Maternal Aunt    Cancer Other    Cancer Sister    Diabetes Sister      Current Outpatient Medications:    aspirin EC 81 MG tablet, Take 81 mg by mouth daily. Swallow whole., Disp: , Rfl:    bimatoprost (LUMIGAN) 0.01 % SOLN, Place 1 drop into both eyes at bedtime. , Disp: , Rfl:    Blood Glucose Monitoring Suppl (ONETOUCH VERIO FLEX SYSTEM) w/Device KIT, USE TO CHECK BLOOD SUGAR 3  TIMES DAILY, Disp: 1 kit, Rfl: 1   Carboxymethylcellulose Sodium (REFRESH LIQUIGEL) 1 % GEL, Place 1 drop into both eyes at bedtime., Disp: , Rfl:    famotidine (PEPCID) 20 MG tablet, Take 20 mg by mouth daily with breakfast., Disp: , Rfl:    furosemide  (LASIX ) 20 MG tablet, Take 1 tablet (20 mg total) by mouth every morning., Disp: 90 tablet, Rfl: 3   Lancets (ONETOUCH DELICA PLUS LANCET33G) MISC, USE LANCETS ONCE DAILY TO CHECK BLOOD SUGARS., Disp: 300 each, Rfl: 3   Lifitegrast (XIIDRA) 5 % SOLN, Place 1 drop into both eyes daily. xiidra, Disp: , Rfl:    magnesium  oxide (MAG-OX) 400 (240 Mg) MG tablet, TAKE 1 TABLET BY MOUTH EVERY DAY AFTER SUPPER, Disp: 90 tablet, Rfl: 1   metFORMIN  (GLUCOPHAGE -XR) 500 MG 24 hr  tablet, TAKE 1 TABLET BY MOUTH TWICE  DAILY WITH A MEAL, Disp: 180 tablet, Rfl: 3   Multiple Vitamins-Minerals (MULTIVITAMIN WITH MINERALS) tablet, Take 1 tablet by mouth daily. Centrum Silver, Disp: , Rfl:    nystatin  powder, Apply 1 Application topically 3 (three) times daily., Disp: 45 g, Rfl: 0   ONETOUCH VERIO test strip, USE AS INSTRUCTED TO CHECK BLOOD SUGAR 3 TIMES DAILY, Disp: 300 strip, Rfl: 3   polyethylene glycol (MIRALAX  / GLYCOLAX ) packet, Take 17 g by mouth daily., Disp: , Rfl:    spironolactone  (ALDACTONE ) 25 MG tablet, Take 1 tablet (25 mg total) by mouth every morning., Disp: 30 tablet, Rfl: 2   triamcinolone  cream (KENALOG ) 0.1 %, APPLY TO AFFECTED AREA TWICE DAILY AS NEEDED, Disp: 45 g, Rfl: 0   vitamin B-6 (PYRIDOXINE) 25 MG tablet, Take 25 mg by mouth daily., Disp: , Rfl:    CRESTOR  20  MG tablet, Take 1 tablet (20 mg total) by mouth at bedtime., Disp: , Rfl:    olmesartan  (BENICAR ) 40 MG tablet, Take 1 tablet (40 mg total) by mouth daily., Disp: , Rfl:    SYNTHROID  50 MCG tablet, Take 1 tablet (50 mcg total) by mouth daily., Disp: , Rfl:    traZODone  (DESYREL ) 50 MG tablet, Take 1 tablet (50 mg total) by mouth at bedtime., Disp: 90 tablet, Rfl: 1   Allergies  Allergen Reactions   Codeine Nausea And Vomiting   Tramadol Nausea And Vomiting   Macrobid [Nitrofurantoin Macrocrystal] Rash     Review of Systems  Constitutional: Negative.   Eyes:  Negative for blurred vision.  Respiratory: Negative.    Cardiovascular: Negative.   Gastrointestinal: Negative.   Endocrine: Negative for polydipsia, polyphagia and polyuria.  Neurological: Negative.   Psychiatric/Behavioral: Negative.       Today's Vitals   02/27/24 1105  BP: 110/70  Pulse: 74  Temp: 97.9 F (36.6 C)  SpO2: 98%  Weight: 217 lb 9.6 oz (98.7 kg)  Height: 5' 3 (1.6 m)   Body mass index is 38.55 kg/m.  Wt Readings from Last 3 Encounters:  02/27/24 217 lb 9.6 oz (98.7 kg)  10/23/23 216 lb 12.8 oz  (98.3 kg)  07/23/23 211 lb (95.7 kg)     Objective:  Physical Exam Vitals and nursing note reviewed.  Constitutional:      Appearance: Normal appearance. She is obese.  HENT:     Head: Normocephalic and atraumatic.     Right Ear: Tympanic membrane, ear canal and external ear normal.     Left Ear: Ear canal and external ear normal. There is impacted cerumen.  Eyes:     Extraocular Movements: Extraocular movements intact.  Cardiovascular:     Rate and Rhythm: Normal rate and regular rhythm.     Heart sounds: Normal heart sounds.  Pulmonary:     Effort: Pulmonary effort is normal.     Breath sounds: Normal breath sounds.  Musculoskeletal:        General: Tenderness present.     Cervical back: Normal range of motion.     Comments: R hand swelling at MCP joints  Skin:    General: Skin is warm.  Neurological:     General: No focal deficit present.     Mental Status: She is alert.  Psychiatric:        Mood and Affect: Mood normal.        Behavior: Behavior normal.         Assessment And Plan:  Hypertensive heart disease without heart failure Assessment & Plan: Chronic, controlled. Hypertension managed with olmesartan .  Home blood pressure readings are well-controlled. Consumes salty foods, potentially affecting blood pressure.  - Advise monitoring blood pressure at home and reducing salt intake   Aortic atherosclerosis (HCC) Assessment & Plan: Chronic, LDL goal <70. She will c/w ASA 81mg  and rosuvastatin  20mg  daily.  She is encouraged to follow a heart healthy lifestyle.    Dyslipidemia associated with type 2 diabetes mellitus (HCC) Assessment & Plan: Blood glucose levels elevated, 130-150 mg/dL. Dietary choices may contribute. - Continue metformin  twice daily. - Encourage dietary modifications to reduce sweets and snacks before bed. - Schedule regular blood work including A1c. - Continue with rosuvastatin , LDL goal is les than 70  Orders: -     CMP14+EGFR -      Hemoglobin A1c  Benign paroxysmal positional vertigo, unspecified laterality Assessment & Plan: Persistent  dizziness. Previous physical therapy provided relief. - Refer back to physical therapy at [facility]. - Encourage continuation of home exercises taught in physical therapy. - Flush left ear to assess if cerumen impaction is contributing to dizziness.  Orders: -     Ambulatory referral to Physical Therapy  Swelling of right hand Assessment & Plan: Swelling and pain with difficulty making a fist. Warmth suggests inflammation. - Apply Voltaren  gel to the right hand twice daily. - Check rheumatoid factor and other relevant labs. - Follow up with hand specialist, Dr. Murrell, for further evaluation and possible x-rays.  Orders: -     ANA, IFA (with reflex) -     CYCLIC CITRUL PEPTIDE ANTIBODY, IGG/IGA -     Rheumatoid factor -     Sedimentation rate -     Uric acid  Chronic right shoulder pain Assessment & Plan: Right shoulder pain with limited range of motion, possibly frozen shoulder. - Perform wall walking exercises to improve shoulder mobility. - She is encouraged to f/u with Ortho for further evaluation   Multiple lung nodules Assessment & Plan: Follow-up required for previously identified lung nodules. - Order CT of the chest at [facility] for follow-up of lung nodules.  Orders: -     CT CHEST WO CONTRAST; Future  Primary hypothyroidism Assessment & Plan: Chronic, currently on synthroid  50mcg daily.  - I will check thyroid  panel and adjust meds as needed.   Orders: -     TSH + free T4  Left ear impacted cerumen Assessment & Plan: AFTER OBTAINING VERBAL CONSENT, LEFT EAR WAS FLUSHED BY IRRIGATION. SHE TOLERATED PROCEDURE WELL WITHOUT ANY COMPLICATIONS. NO TM ABNORMALITIES WERE NOTED.   Orders: -     Ear Lavage  Osteopenia of left hip Assessment & Plan: Osteopenia noted on previous bone density scan. - Order bone density scan at [facility].  Orders: -     DG Bone Density; Future  Class 2 severe obesity due to excess calories with serious comorbidity and body mass index (BMI) of 38.0 to 38.9 in adult [facility]) Assessment & Plan: She is encouraged to aim for at least 150 minutes of exercise per week. Advised to strive to lose ten percent of her body weight.    Other orders -     Olmesartan  Medoxomil; Take 1 tablet (40 mg total) by mouth daily. -     Crestor ; Take 1 tablet (20 mg total) by mouth at bedtime. -     Synthroid ; Take 1 tablet (50 mcg total) by mouth daily. -     traZODone  HCl; Take 1 tablet (50 mg total) by mouth at bedtime.  Dispense: 90 tablet; Refill: 1   Return if symptoms worsen or fail to improve.  Patient was given opportunity to ask questions. Patient verbalized understanding of the plan and was able to repeat key elements of the plan. All questions were answered to their satisfaction.   I, Stephanie LOISE Slocumb, MD, have reviewed all documentation for this visit. The documentation on 02/27/24 for the exam, diagnosis, procedures, and orders are all accurate and complete.   IF YOU HAVE BEEN REFERRED TO A SPECIALIST, IT MAY TAKE 1-2 WEEKS TO SCHEDULE/PROCESS THE REFERRAL. IF YOU HAVE NOT HEARD FROM US /SPECIALIST IN TWO WEEKS, PLEASE GIVE US  A CALL AT [phone].   THE PATIENT IS ENCOURAGED TO PRACTICE SOCIAL DISTANCING DUE TO THE COVID-19 PANDEMIC.

## 2024-02-27 NOTE — Patient Instructions (Signed)
 Hypertension, Adult Hypertension is another name for high blood pressure. High blood pressure forces your heart to work harder to pump blood. This can cause problems over time. There are two numbers in a blood pressure reading. There is a top number (systolic) over a bottom number (diastolic). It is best to have a blood pressure that is below 120/80. What are the causes? The cause of this condition is not known. Some other conditions can lead to high blood pressure. What increases the risk? Some lifestyle factors can make you more likely to develop high blood pressure: Smoking. Not getting enough exercise or physical activity. Being overweight. Having too much fat, sugar, calories, or salt (sodium) in your diet. Drinking too much alcohol . Other risk factors include: Having any of these conditions: Heart disease. Diabetes. High cholesterol. Kidney disease. Obstructive sleep apnea. Having a family history of high blood pressure and high cholesterol. Age. The risk increases with age. Stress. What are the signs or symptoms? High blood pressure may not cause symptoms. Very high blood pressure (hypertensive crisis) may cause: Headache. Fast or uneven heartbeats (palpitations). Shortness of breath. Nosebleed. Vomiting or feeling like you may vomit (nauseous). Changes in how you see. Very bad chest pain. Feeling dizzy. Seizures. How is this treated? This condition is treated by making healthy lifestyle changes, such as: Eating healthy foods. Exercising more. Drinking less alcohol . Your doctor may prescribe medicine if lifestyle changes do not help enough and if: Your top number is above 130. Your bottom number is above 80. Your personal target blood pressure may vary. Follow these instructions at home: Eating and drinking  If told, follow the DASH eating plan. To follow this plan: Fill one half of your plate at each meal with fruits and vegetables. Fill one fourth of your plate  at each meal with whole grains. Whole grains include whole-wheat pasta, brown rice, and whole-grain bread. Eat or drink low-fat dairy products, such as skim milk or low-fat yogurt. Fill one fourth of your plate at each meal with low-fat (lean) proteins. Low-fat proteins include fish, chicken without skin, eggs, beans, and tofu. Avoid fatty meat, cured and processed meat, or chicken with skin. Avoid pre-made or processed food. Limit the amount of salt in your diet to less than 1,500 mg each day. Do not drink alcohol  if: Your doctor tells you not to drink. You are pregnant, may be pregnant, or are planning to become pregnant. If you drink alcohol : Limit how much you have to: 0-1 drink a day for women. 0-2 drinks a day for men. Know how much alcohol  is in your drink. In the [place] one drink equals one 12 oz bottle of beer (355 mL), one 5 oz glass of wine (148 mL), or one 1 oz glass of hard liquor (44 mL). Lifestyle  Work with your doctor to stay at a healthy weight or to lose weight. Ask your doctor what the best weight is for you. Get at least 30 minutes of exercise that causes your heart to beat faster (aerobic exercise) most days of the week. This may include walking, swimming, or biking. Get at least 30 minutes of exercise that strengthens your muscles (resistance exercise) at least 3 days a week. This may include lifting weights or doing Pilates. Do not smoke or use any products that contain nicotine  or tobacco. If you need help quitting, ask your doctor. Check your blood pressure at home as told by your doctor. Keep all follow-up visits. Medicines Take over-the-counter and prescription medicines  only as told by your doctor. Follow directions carefully. Do not skip doses of blood pressure medicine. The medicine does not work as well if you skip doses. Skipping doses also puts you at risk for problems. Ask your doctor about side effects or reactions to medicines that you should watch  for. Contact a doctor if: You think you are having a reaction to the medicine you are taking. You have headaches that keep coming back. You feel dizzy. You have swelling in your ankles. You have trouble with your vision. Get help right away if: You get a very bad headache. You start to feel mixed up (confused). You feel weak or numb. You feel faint. You have very bad pain in your: Chest. Belly (abdomen). You vomit more than once. You have trouble breathing. These symptoms may be an emergency. Get help right away. Call 911. Do not wait to see if the symptoms will go away. Do not drive yourself to the hospital. Summary Hypertension is another name for high blood pressure. High blood pressure forces your heart to work harder to pump blood. For most people, a normal blood pressure is less than 120/80. Making healthy choices can help lower blood pressure. If your blood pressure does not get lower with healthy choices, you may need to take medicine. This information is not intended to replace advice given to you by your health care provider. Make sure you discuss any questions you have with your health care provider. Document Revised: 04/13/2021 Document Reviewed: 04/13/2021 Elsevier Patient Education  2024 [employer].

## 2024-02-29 LAB — CMP14+EGFR
ALT: 24 IU/L (ref 0–32)
AST: 25 IU/L (ref 0–40)
Albumin: 4.1 g/dL (ref 3.7–4.7)
Alkaline Phosphatase: 79 IU/L (ref 44–121)
BUN/Creatinine Ratio: 20 (ref 12–28)
BUN: 12 mg/dL (ref 8–27)
Bilirubin Total: 0.5 mg/dL (ref 0.0–1.2)
CO2: 22 mmol/L (ref 20–29)
Calcium: 9.4 mg/dL (ref 8.7–10.3)
Chloride: 106 mmol/L (ref 96–106)
Creatinine, Ser: 0.59 mg/dL (ref 0.57–1.00)
Globulin, Total: 2.2 g/dL (ref 1.5–4.5)
Glucose: 96 mg/dL (ref 70–99)
Potassium: 3.8 mmol/L (ref 3.5–5.2)
Sodium: 142 mmol/L (ref 134–144)
Total Protein: 6.3 g/dL (ref 6.0–8.5)
eGFR: 89 mL/min/1.73 (ref >59)

## 2024-02-29 LAB — URIC ACID: Uric Acid: 7 mg/dL (ref 3.1–7.9)

## 2024-02-29 LAB — RHEUMATOID FACTOR: Rheumatoid fact SerPl-aCnc: <10 [IU]/mL (ref <14.0)

## 2024-02-29 LAB — CYCLIC CITRUL PEPTIDE ANTIBODY, IGG/IGA: Cyclic Citrullin Peptide Ab: 6 U (ref 0–19)

## 2024-02-29 LAB — ANTINUCLEAR ANTIBODIES, IFA: ANA Titer 1: NEGATIVE

## 2024-02-29 LAB — TSH+FREE T4
Free T4: 1.49 ng/dL (ref 0.82–1.77)
TSH: 1.02 u[IU]/mL (ref 0.450–4.500)

## 2024-02-29 LAB — SEDIMENTATION RATE: Sed Rate: 18 mm/h (ref 0–40)

## 2024-02-29 LAB — HEMOGLOBIN A1C
Est. average glucose Bld gHb Est-mCnc: 163 mg/dL
Hgb A1c MFr Bld: 7.3 % — ABNORMAL HIGH (ref 4.8–5.6)

## 2024-03-01 DIAGNOSIS — G8929 Other chronic pain: Secondary | ICD-10-CM | POA: Insufficient documentation

## 2024-03-01 DIAGNOSIS — M85852 Other specified disorders of bone density and structure, left thigh: Secondary | ICD-10-CM | POA: Insufficient documentation

## 2024-03-01 DIAGNOSIS — H6122 Impacted cerumen, left ear: Secondary | ICD-10-CM | POA: Insufficient documentation

## 2024-03-01 NOTE — Assessment & Plan Note (Signed)
 Chronic, currently on synthroid  50mcg daily.  - I will check thyroid  panel and adjust meds as needed.

## 2024-03-01 NOTE — Assessment & Plan Note (Signed)
 Osteopenia noted on previous bone density scan. - Order bone density scan at [facility].

## 2024-03-01 NOTE — Assessment & Plan Note (Signed)
 She is encouraged to aim for at least 150 minutes of exercise per week. Advised to strive to lose ten percent of her body weight.

## 2024-03-01 NOTE — Assessment & Plan Note (Signed)
 Chronic, LDL goal <70. She will c/w ASA 81mg  and rosuvastatin 20mg  daily.  She is encouraged to follow a heart healthy lifestyle.

## 2024-03-01 NOTE — Assessment & Plan Note (Signed)
 AFTER OBTAINING VERBAL CONSENT, LEFT EAR WAS FLUSHED BY IRRIGATION. SHE TOLERATED PROCEDURE WELL WITHOUT ANY COMPLICATIONS. NO TM ABNORMALITIES WERE NOTED.

## 2024-03-01 NOTE — Assessment & Plan Note (Signed)
 Right shoulder pain with limited range of motion, possibly frozen shoulder. - Perform wall walking exercises to improve shoulder mobility. - She is encouraged to f/u with Ortho for further evaluation

## 2024-03-01 NOTE — Assessment & Plan Note (Signed)
 Chronic, controlled. Hypertension managed with olmesartan .  Home blood pressure readings are well-controlled. Consumes salty foods, potentially affecting blood pressure.  - Advise monitoring blood pressure at home and reducing salt intake

## 2024-03-01 NOTE — Assessment & Plan Note (Signed)
 Follow-up required for previously identified lung nodules. - Order CT of the chest at [facility] for follow-up of lung nodules.

## 2024-03-01 NOTE — Assessment & Plan Note (Signed)
 Swelling and pain with difficulty making a fist. Warmth suggests inflammation. - Apply Voltaren  gel to the right hand twice daily. - Check rheumatoid factor and other relevant labs. - Follow up with hand specialist, Dr. Murrell, for further evaluation and possible x-rays.

## 2024-03-01 NOTE — Assessment & Plan Note (Addendum)
 Blood glucose levels elevated, 130-150 mg/dL. Dietary choices may contribute. - Continue metformin  twice daily. - Encourage dietary modifications to reduce sweets and snacks before bed. - Schedule regular blood work including A1c. - Continue with rosuvastatin , LDL goal is les than 70

## 2024-03-01 NOTE — Assessment & Plan Note (Signed)
 Persistent dizziness. Previous physical therapy provided relief. - Refer back to physical therapy at [facility]. - Encourage continuation of home exercises taught in physical therapy. - Flush left ear to assess if cerumen impaction is contributing to dizziness.

## 2024-03-02 ENCOUNTER — Ambulatory Visit: Payer: Self-pay | Admitting: Internal Medicine

## 2024-03-02 DIAGNOSIS — I700 Atherosclerosis of aorta: Secondary | ICD-10-CM

## 2024-03-02 DIAGNOSIS — E1169 Type 2 diabetes mellitus with other specified complication: Secondary | ICD-10-CM

## 2024-03-02 DIAGNOSIS — I119 Hypertensive heart disease without heart failure: Secondary | ICD-10-CM

## 2024-03-03 ENCOUNTER — Ambulatory Visit (HOSPITAL_BASED_OUTPATIENT_CLINIC_OR_DEPARTMENT_OTHER)
Admission: RE | Admit: 2024-03-03 | Discharge: 2024-03-03 | Disposition: A | Discharge status: Home / Self Care | Source: Ambulatory Visit | Attending: Internal Medicine | Admitting: Internal Medicine

## 2024-03-03 ENCOUNTER — Other Ambulatory Visit

## 2024-03-03 DIAGNOSIS — R918 Other nonspecific abnormal finding of lung field: Secondary | ICD-10-CM | POA: Insufficient documentation

## 2024-03-11 ENCOUNTER — Telehealth: Payer: Self-pay | Admitting: Pharmacist

## 2024-03-11 DIAGNOSIS — E1169 Type 2 diabetes mellitus with other specified complication: Secondary | ICD-10-CM

## 2024-03-11 NOTE — Progress Notes (Unsigned)
 03/11/2024 Name: [name] MRN: [id number] DOB: 12/24/1940  Chief Complaint  Patient presents with   Medication Management    Diabetes    [name] is a 83 y.o. year old female who presented for a telephone visit.   They were referred to the pharmacist by their PCP for assistance in managing diabetes.    Subjective: [name] is an 83 year old female with multiple medical conditions including but not limited to:  type 2 diabetes, obesity, coronary artery disease, GERD, osteoarthritis of the knees, hypothyroidism, aortic stenosis and vertigo.   Care Team: Primary Care Provider: Jarold Medici, MD ; Next Scheduled Visit: 06/10/2024   Medication Access/Adherence  Current Pharmacy:  [facility] - [place], [place] - [address] [facility] [address] [place] [address] [place] [address] Phone: [phone] Fax: [phone]  [id number] - [place], [place] - [address] [place] [address] Phone: [phone] Fax: [phone]   Patient reports affordability concerns with their medications: No  Patient reports access/transportation concerns to their pharmacy: No  Patient reports adherence concerns with their medications:  No      Diabetes:  Current medications:   Metformin  XR 500 mg 1 tablet twice daily  Current glucose readings: Reported they have been running in the 130s    Patient reports hypoglycemic s/sx including  dizziness, shakiness, sweating. Patient denies hyperglycemic symptoms including  polyuria, polydipsia, polyphagia, nocturia, neuropathy, blurred vision.  Current meal patterns:  - Breakfast: piece of toast with cream cheese or butter, slice of tomato, bacon on weekends - Lunch salad, tuna fish, fried chicken - Supper chicken, vegetable, white potato, rice   Macrovascular and Microvascular Risk Reduction:  Statin? yes (rosuvastatin  20 mg filled  01/13/2024); ACEi/ARB? yes (Olmesartan  40 mg ) #90 Last urinary albumin/creatinine ratio:  Lab Results  Component Value Date   MICRALBCREAT 19 10/23/2023   MICRALBCREAT 17 10/22/2022   MICRALBCREAT 20 10/10/2021   MICRALBCREAT 30 07/13/2020   MICRALBCREAT 30 06/23/2019   Last eye exam:  Lab Results  Component Value Date   HMDIABEYEEXA No Retinopathy 02/13/2024   Last foot exam: No foot exam found Tobacco Use:  Tobacco Use: Low Risk  (02/27/2024)   Patient History    Smoking Tobacco Use: Never    Smokeless Tobacco Use: Never    Passive Exposure: Not on file   Objective:  Lab Results  Component Value Date   HGBA1C 7.3 (H) 02/27/2024    Lab Results  Component Value Date   CREATININE 0.59 02/27/2024   BUN 12 02/27/2024   NA 142 02/27/2024   K 3.8 02/27/2024   CL 106 02/27/2024   CO2 22 02/27/2024    Lab Results  Component Value Date   CHOL 155 10/23/2023   HDL 62 10/23/2023   LDLCALC 77 10/23/2023   TRIG 87 10/23/2023   CHOLHDL 2.5 10/23/2023    Medications Reviewed Today     Reviewed by Brought Cassius PARAS, RPH (Pharmacist) on 03/11/24 at 1548  Med List Status: <None>   Medication Order Taking? Sig Documenting Provider Last Dose Status Informant  aspirin EC 81 MG tablet [id number] Yes Take 81 mg by mouth daily. Swallow whole. [provider]  Active Self  bimatoprost (LUMIGAN) 0.01 % SOLN [id number] Yes Place 1 drop into both eyes at bedtime.  [provider]  Active Self  Blood Glucose Monitoring Suppl (ONETOUCH VERIO FLEX SYSTEM) w/Device  KIT [id number] Yes USE TO CHECK BLOOD SUGAR 3  TIMES DAILY Jarold Medici, MD  Active Self  Carboxymethylcellulose Sodium (REFRESH LIQUIGEL) 1 % GEL [id number]  Place 1 drop into both eyes at bedtime.  Patient not taking: Reported on 03/11/2024   [provider]  Active Self  CRESTOR  20 MG tablet [id number] Yes Take 1 tablet (20 mg total) by mouth at bedtime. Jarold Medici, MD  Active   famotidine (PEPCID)  20 MG tablet [id number] Yes Take 20 mg by mouth daily with breakfast. [provider]  Active Self  furosemide  (LASIX ) 20 MG tablet [id number] Yes Take 1 tablet (20 mg total) by mouth every morning. Jarold Medici, MD  Active   hydroquinone 4 % cream [id number] Yes Apply topically daily. [provider]  Active   Lancets [facility] DELICA PLUS Roy) MISC [id number] Yes USE LANCETS ONCE DAILY TO CHECK BLOOD SUGARS. Jarold Medici, MD  Active   Lifitegrast CARON) 5 % SOLN [id number] Yes Place 1 drop into both eyes daily. xiidra [provider]  Active Self  magnesium  oxide (MAG-OX) 400 (240 Mg) MG tablet [id number] Yes TAKE 1 TABLET BY MOUTH EVERY DAY AFTER SUPPER Jarold Medici, MD  Active   metFORMIN  (GLUCOPHAGE -XR) 500 MG 24 hr tablet [id number] Yes TAKE 1 TABLET BY MOUTH TWICE  DAILY WITH A MEAL Jarold Medici, MD  Active   Multiple Vitamins-Minerals (MULTIVITAMIN WITH MINERALS) tablet [id number] Yes Take 1 tablet by mouth daily. Centrum Silver [provider]  Active Self  nystatin  powder [id number]  Apply 1 Application topically 3 (three) times daily.  Patient not taking: Reported on 03/11/2024   Jarold Medici, MD  Active   olmesartan  (BENICAR ) 40 MG tablet [id number] Yes Take 1 tablet (40 mg total) by mouth daily. Jarold Medici, MD  Active   [facility] VERIO test strip [id number] Yes USE AS INSTRUCTED TO CHECK BLOOD SUGAR 3 TIMES DAILY Jarold Medici, MD  Active   polyethylene glycol (MIRALAX  / GLYCOLAX ) packet [id number] Yes Take 17 g by mouth daily. [provider]  Active Self  spironolactone  (ALDACTONE ) 25 MG tablet [id number]  Take 1 tablet (25 mg total) by mouth every morning.  Patient not taking: Reported on 03/11/2024   Ladona Heinz, MD  Expired 02/27/24 2359   SYNTHROID  50 MCG tablet [id number] Yes Take 1 tablet (50 mcg total) by mouth daily. Jarold Medici, MD  Active   traZODone  (DESYREL ) 50 MG tablet [id number]  Take 1 tablet (50 mg total) by mouth at  bedtime.  Patient not taking: Reported on 03/11/2024   Jarold Medici, MD  Active   triamcinolone  cream (KENALOG ) 0.1 % [id number]  APPLY TO AFFECTED AREA TWICE DAILY AS NEEDED  Patient not taking: Reported on 03/11/2024   Jarold Medici, MD  Active   vitamin B-6 (PYRIDOXINE) 25 MG tablet [id number] Yes Take 25 mg by mouth daily. [provider]  Active Self                02/27/2024   11:05 AM 10/23/2023   11:31 AM 10/23/2023   11:29 AM  Vitals with BMI  Height 5' 3    Weight 217 lbs 10 oz    BMI 38.56    Systolic 110 124 879  Diastolic 70 62 64  Pulse 74 79 79    Assessment/Plan:   Diabetes: - Currently uncontrolled (per provider); goal A1c <7%. Cardiorenal risk reduction is optimized.. Blood pressure is at goal <130/80. LDL is at goal.  - Increase Metformin  to 500  mg 1 tablet in the morning and two in the evening. Sent new prescription to [facility] required.    Follow Up Plan:  Call Patient in about 2 week to checks on blood sugars as she is going out of town.    Cassius DOROTHA Brought, PharmD, BCACP Clinical Pharmacist [phone]

## 2024-03-13 ENCOUNTER — Encounter: Payer: Self-pay | Admitting: Pharmacist

## 2024-03-13 MED ORDER — METFORMIN HCL ER 500 MG PO TB24
500.0000 mg | ORAL_TABLET | Freq: Two times a day (BID) | ORAL | 3 refills | Status: DC
Start: 2024-03-13 — End: 2024-03-24

## 2024-03-13 NOTE — Progress Notes (Signed)
 error

## 2024-03-24 ENCOUNTER — Telehealth: Payer: Self-pay | Admitting: Pharmacist

## 2024-03-24 DIAGNOSIS — E1169 Type 2 diabetes mellitus with other specified complication: Secondary | ICD-10-CM

## 2024-03-24 MED ORDER — METFORMIN HCL ER 500 MG PO TB24
ORAL_TABLET | ORAL | 3 refills | Status: AC
Start: 2024-03-24 — End: ?

## 2024-03-24 NOTE — Progress Notes (Signed)
   03/24/2024  Patient ID: [name] Brought, female   DOB: [birth year], 83 y.o.   MRN: [id number]  Patient called and said she had not received her metformin  from [facility] mail order pharmacy.  HIPAA identifiers were obtained.  Patient's metfomin XR 500 mg dose was increased to 1 tablet in the morning and 2 tablets in the evening. She needed a refill but the old instructions were sent to the pharmacy per the conference phone call with [facility] and the Patient. A new prescription was created and sent to the PCP for approval via co-signature required.    Plan: Follow up with the Patient in 3-5 business days to be sure she receives her shipment.   Cassius DOROTHA Brought, PharmD, [facility] Clinical Pharmacist [facility] [phone]

## 2024-03-25 LAB — HM DEXA SCAN

## 2024-03-27 ENCOUNTER — Other Ambulatory Visit (HOSPITAL_COMMUNITY): Payer: Self-pay | Admitting: Orthopedic Surgery

## 2024-03-27 ENCOUNTER — Ambulatory Visit (HOSPITAL_COMMUNITY)
Admission: RE | Admit: 2024-03-27 | Discharge: 2024-03-27 | Disposition: A | Discharge status: Home / Self Care | Source: Ambulatory Visit | Attending: Vascular Surgery | Admitting: Vascular Surgery

## 2024-03-27 ENCOUNTER — Encounter: Payer: Self-pay | Admitting: Internal Medicine

## 2024-03-27 DIAGNOSIS — M79641 Pain in right hand: Secondary | ICD-10-CM | POA: Diagnosis present

## 2024-04-10 ENCOUNTER — Telehealth: Payer: Self-pay | Admitting: Pharmacist

## 2024-04-10 DIAGNOSIS — E7800 Pure hypercholesterolemia, unspecified: Secondary | ICD-10-CM

## 2024-04-10 NOTE — Progress Notes (Signed)
   04/10/2024  Patient ID: [name] Brought, female   DOB: [birth year], 83 y.o.   MRN: [id number] Pharmacy Quality Measure Review  This patient is appearing on a report for being at risk of failing the adherence measure for cholesterol (statin) and diabetes medications this calendar year.   Medication: Rosuvastatin  and Metformin  Last fill date: 09/25/ and 09/16  for 90 day supplies respectfully  Insurance report was not up to date. No action needed at this time.   A1c-7.3%  LDL-77  Cassius DOROTHA Brought, PharmD, BCACP Clinical Pharmacist [phone]

## 2024-04-13 ENCOUNTER — Ambulatory Visit: Payer: Self-pay | Admitting: Internal Medicine

## 2024-04-14 ENCOUNTER — Other Ambulatory Visit: Payer: Self-pay | Admitting: Internal Medicine

## 2024-04-14 DIAGNOSIS — M816 Localized osteoporosis [Lequesne]: Secondary | ICD-10-CM

## 2024-04-23 ENCOUNTER — Ambulatory Visit: Admitting: Physician Assistant

## 2024-04-27 ENCOUNTER — Encounter: Payer: Self-pay | Admitting: Physician Assistant

## 2024-04-27 ENCOUNTER — Ambulatory Visit: Admitting: Physician Assistant

## 2024-04-27 VITALS — Ht 63.0 in | Wt 214.0 lb

## 2024-04-27 DIAGNOSIS — M85852 Other specified disorders of bone density and structure, left thigh: Secondary | ICD-10-CM | POA: Diagnosis not present

## 2024-04-27 LAB — VITAMIN D 25 HYDROXY (VIT D DEFICIENCY, FRACTURES): Vit D, 25-Hydroxy: 49 ng/mL (ref 30–100)

## 2024-04-27 NOTE — Progress Notes (Signed)
 Office Visit Note   Patient: [name]           Date of Birth: 06-21-1941           MRN: [id number] Visit Date: 04/27/2024              Requested by: Jarold Medici, MD [address] [place],  [place] [address] PCP: Jarold Medici, MD   Assessment & Plan: Visit Diagnoses:  1. Osteopenia of left hip     Plan: Patient is a pleasant active 83 year old woman who comes in referred from Dr. Jarold for evaluation of osteoporosis.  She actually takes fairly good care of herself.  She is not currently taking any medications for osteoporosis she has never had a fracture in her spine hip or wrist.  She does not have any history of heart disease or cancer.  She has no history of kidney disease.  She does have a history of ulcers.  No history of gastric bypass or severe reflux.  No history of epilepsy or febrile seizures.  She really does not watch her calcium  or vitamin D intake.  She never did hormone replacement therapy following menopause.  She is not a smoker or drinker.  She does do some resistance 3 times a week and when she does water aerobics she has not had any major dental work and no history of fragility fracture in her family.  She did have a bone density scan recently which was concerning for her hip at -2.5 which is just into osteoporosis.  The rest of her bone density was osteopenia.  I had a long discussion with her I would like to make sure she is tracking her calcium  and we will obtain a vitamin D today.  She already is engaging in exercises.  We discussed medications including side effects both common and uncommon.  I spent 45 minutes reviewing her chart and speaking with her.  She could consider Prolia.  Her FRAX scores are actually within normal.  She has a 8% risk of overall osteoporosis fracture in the next 10 years and 2.6% chance in the hip.  I think either way she could consider Prolia.  She understand she have to be on this or another medication for life.   Alternatively she could have a bone density done in another 2 years.  She will consider all this and contact me if she wishes to go forward with treatment  Follow-Up Instructions: Return if symptoms worsen or fail to improve.   Orders:  Orders Placed This Encounter  Procedures   Vitamin D (25 hydroxy)   No orders of the defined types were placed in this encounter.     Procedures: No procedures performed   Clinical Data: No additional findings.   Subjective: Chief Complaint  Patient presents with   Osteoporosis    HPI pleasant active 83 year old woman referred by Dr. Jarold for evaluation of osteoporosis  Review of Systems  All other systems reviewed and are negative.    Objective: Vital Signs: Ht 5' 3 (1.6 m)   Wt 214 lb (97.1 kg)   BMI 37.91 kg/m   Physical Exam Pulmonary:     Effort: Pulmonary effort is normal.  Musculoskeletal:     Cervical back: Normal range of motion.  Skin:    General: Skin is warm and dry.  Neurological:     General: No focal deficit present.     Mental Status: She is alert and oriented to person, place,  and time.     Ortho Exam  Specialty Comments:  No specialty comments available.  Imaging: No results found.   PMFS History: Patient Active Problem List   Diagnosis Date Noted   Left ear impacted cerumen 03/01/2024   Chronic right shoulder pain 03/01/2024   Osteopenia of left hip 03/01/2024   Benign paroxysmal positional vertigo 10/24/2023   Encounter for general adult medical examination w/o abnormal findings 10/23/2023   Dizziness 07/28/2023   Right hip pain 07/28/2023   Hypertensive heart disease without heart failure 05/22/2023   Gastroesophageal reflux disease without esophagitis 03/31/2023   Primary insomnia 03/11/2023   Aortic atherosclerosis 07/21/2022   Claudication 06/26/2022   Dermatitis 05/06/2022   Erythrocytosis 05/06/2022   Swelling of right hand 05/06/2022   Dyslipidemia associated with type 2  diabetes mellitus (HCC) 10/28/2021   Multiple lung nodules 10/28/2021   Hypercalcemia 10/28/2021   Class 2 severe obesity due to excess calories with serious comorbidity and body mass index (BMI) of 38.0 to 38.9 in adult 07/13/2020   Primary hypothyroidism 04/06/2018   Hyperlipidemia 04/06/2018   Essential hypertension 04/06/2018   Abnormal glucose 04/06/2018   Chest pain 05/03/2015   Shortness of breath 05/03/2015   Dizziness and giddiness 05/18/2014   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   Vertigo 01/06/2014   Postoperative anemia due to acute blood loss 12/30/2012   OA (osteoarthritis) of knee 12/29/2012   Past Medical History:  Diagnosis Date   Arthritis    RIGHT KNEE PAIN AND OA   Cataract    Chest pain 05/03/2015   Diabetes mellitus without complication (HCC)    Elevated cholesterol    Encounter for general adult medical examination w/o abnormal findings 10/23/2023   GERD (gastroesophageal reflux disease)    Glaucoma 2018   History of kidney stones    Hypertension    Hypothyroidism    IBS (irritable bowel syndrome)    Localized swelling of both lower legs    Rotator cuff disorder    BOTH SHOULDERS - NO SURGERY - AND STATES LIMITATIONS IN ARM MOVEMENT   Shortness of breath 05/03/2015   Swelling    CHRONIC SWELLING RIGHT HAND AND BOTH FEET AND BOTH LEGS   Vertigo     Family History  Problem Relation Age of Onset   Diabetes Mother    Dementia Mother    Diabetes Father    Diabetes Sister    Breast cancer Sister    Kidney cancer Sister    Cancer Sister    Lung cancer Sister    Lung cancer Maternal Aunt    Pancreatic cancer Maternal Aunt    Cancer Other    Cancer Sister    Diabetes Sister     Past Surgical History:  Procedure Laterality Date   ABDOMINAL HYSTERECTOMY  1985   BREAST SURGERY  1985   RIGHT BREAST BIOPSY - BENIGN   CARPAL TUNNEL RELEASE Left 02/05/2019   Procedure: LEFT CARPAL TUNNEL RELEASE;  Surgeon: Murrell Kuba, MD;  Location: [facility];  Service: Orthopedics;  Laterality: Left;   CARPAL TUNNEL RELEASE Right 07/25/2021   Procedure: RIGHT CARPAL TUNNEL RELEASE;  Surgeon: Murrell Kuba, MD;  Location:  SURGERY CENTER;  Service: Orthopedics;  Laterality: Right;  Bier block   CHOLECYSTECTOMY  1975   EYE SURGERY  1995   BILATERAL CATARACT EXTRACTIONS; SURGERY FOR MACULAR HOLE    HEMORRHOID SURGERY  1975   JOINT REPLACEMENT     PARATHYROIDECTOMY Right 04/12/2022   Procedure:  RIGHT INFERIOR PARATHYROIDECTOMY;  Surgeon: Eletha Boas, MD;  Location: [facility];  Service: General;  Laterality: Right;   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

## 2024-05-11 ENCOUNTER — Encounter: Payer: Self-pay | Admitting: Radiology

## 2024-06-10 ENCOUNTER — Encounter: Payer: Self-pay | Admitting: Internal Medicine

## 2024-06-10 ENCOUNTER — Ambulatory Visit: Payer: Self-pay

## 2024-06-10 ENCOUNTER — Ambulatory Visit: Payer: Self-pay | Admitting: Internal Medicine

## 2024-06-10 ENCOUNTER — Ambulatory Visit: Payer: Medicare Other

## 2024-06-10 VITALS — BP 128/68 | HR 90 | Temp 97.8°F | Ht 63.0 in | Wt 220.2 lb

## 2024-06-10 VITALS — BP 128/68 | HR 90 | Temp 97.8°F | Ht 63.0 in | Wt 220.0 lb

## 2024-06-10 DIAGNOSIS — I119 Hypertensive heart disease without heart failure: Secondary | ICD-10-CM

## 2024-06-10 DIAGNOSIS — Z0000 Encounter for general adult medical examination without abnormal findings: Secondary | ICD-10-CM

## 2024-06-10 DIAGNOSIS — I700 Atherosclerosis of aorta: Secondary | ICD-10-CM

## 2024-06-10 DIAGNOSIS — E039 Hypothyroidism, unspecified: Secondary | ICD-10-CM

## 2024-06-10 DIAGNOSIS — E785 Hyperlipidemia, unspecified: Secondary | ICD-10-CM | POA: Diagnosis not present

## 2024-06-10 DIAGNOSIS — E1169 Type 2 diabetes mellitus with other specified complication: Secondary | ICD-10-CM

## 2024-06-10 MED ORDER — ONETOUCH VERIO VI STRP: ORAL_STRIP | 3 refills | Status: AC

## 2024-06-10 MED ORDER — ONETOUCH DELICA PLUS LANCET33G MISC: 3 refills | Status: AC

## 2024-06-10 NOTE — Assessment & Plan Note (Signed)
 Blood glucose levels elevated, 130-150 mg/dL. Dietary choices may contribute.   - Managed with metformin . Not on insulin. Difficulty obtaining lancets and test strips. Inquired about 'Free Sugar Pro' supplement, no recommendation made. - Sent prescription for lancets and test strips to [facility]. - Continue metformin  twice daily. - Encourage dietary modifications to reduce sweets and snacks before bed. - Schedule regular blood work including A1c. - Continue with rosuvastatin , LDL goal is less than 70

## 2024-06-10 NOTE — Progress Notes (Signed)
 Chief Complaint  Patient presents with   Medicare Wellness     Subjective:   [name] is a 83 y.o. female who presents for a Medicare Annual Wellness Visit.  Visit info / Clinical Intake: Medicare Wellness Visit Type:: Subsequent Annual Wellness Visit Persons participating in visit and providing information:: patient Medicare Wellness Visit Mode:: In-person (required for WTM) Interpreter Needed?: No Pre-visit prep was completed: yes AWV questionnaire completed by patient prior to visit?: yes Date:: 06/07/24 Living arrangements:: (!) lives alone Patient's Overall Health Status Rating: very good Typical amount of pain: some (pain in hand) Does pain affect daily life?: (!) yes (sometimes can't use it) Are you currently prescribed opioids?: no  Dietary Habits and Nutritional Risks How many meals a day?: 3 Eats fruit and vegetables daily?: yes Most meals are obtained by: preparing own meals In the last 2 weeks, have you had any of the following?: none Diabetic:: (!) yes Any non-healing wounds?: no How often do you check your BS?: 1 Would you like to be referred to a Nutritionist or for Diabetic Management? : no  Functional Status Activities of Daily Living (to include ambulation/medication): Independent Ambulation: Independent Medication Administration: Independent Home Management (perform basic housework or laundry): Independent Manage your own finances?: yes Primary transportation is: driving Concerns about vision?: (!) yes (blurry vision at times) Concerns about hearing?: no  Fall Screening Falls in the past year?: 1 (miss stepped) Number of falls in past year: 0 Was there an injury with Fall?: 0 Fall Risk Category Calculator: 1 Patient Fall Risk Level: Low Fall Risk  Fall Risk Patient at Risk for Falls Due to: Medication side effect Fall risk Follow up: Falls prevention discussed; Falls evaluation completed  Home and Transportation Safety: All rugs have  non-skid backing?: N/A, no rugs All stairs or steps have railings?: N/A, no stairs Grab bars in the bathtub or shower?: yes Have non-skid surface in bathtub or shower?: yes Good home lighting?: yes Regular seat belt use?: yes Hospital stays in the last year:: no  Cognitive Assessment Difficulty concentrating, remembering, or making decisions? : no Will 6CIT or Mini Cog be Completed: yes What year is it?: 0 points What month is it?: 0 points Give patient an address phrase to remember (5 components): [address] About what time is it?: 0 points Count backwards from 20 to 1: 2 points Say the months of the year in reverse: 0 points Repeat the address phrase from earlier: 0 points 6 CIT Score: 2 points  Advance Directives (For Healthcare) Does Patient Have a Medical Advance Directive?: Yes Does patient want to make changes to medical advance directive?: No - Patient declined Type of Advance Directive: Living will; Healthcare Power of Attorney Copy of Healthcare Power of Attorney in Chart?: Yes - validated most recent copy scanned in chart (See row information) Copy of Living Will in Chart?: Yes - validated most recent copy scanned in chart (See row information)  Reviewed/Updated  Reviewed/Updated: Reviewed All (Medical, Surgical, Family, Medications, Allergies, Care Teams, Patient Goals)    Allergies (verified) Codeine, Tramadol, and Macrobid [nitrofurantoin macrocrystal]   Current Medications (verified) Outpatient Encounter Medications as of 06/10/2024  Medication Sig   aspirin EC 81 MG tablet Take 81 mg by mouth daily. Swallow whole.   bimatoprost (LUMIGAN) 0.01 % SOLN Place 1 drop into both eyes at bedtime.    Blood Glucose Monitoring Suppl (ONETOUCH VERIO FLEX SYSTEM) w/Device KIT USE TO CHECK BLOOD SUGAR 3  TIMES DAILY  CRESTOR  20 MG tablet Take 1 tablet (20 mg total) by mouth at bedtime.   famotidine (PEPCID) 20 MG tablet Take 20 mg by mouth daily with breakfast.    furosemide  (LASIX ) 20 MG tablet Take 1 tablet (20 mg total) by mouth every morning.   hydroquinone 4 % cream Apply topically daily.   Lancets (ONETOUCH DELICA PLUS LANCET33G) MISC USE LANCETS ONCE DAILY TO CHECK BLOOD SUGARS.   Lifitegrast (XIIDRA) 5 % SOLN Place 1 drop into both eyes daily. xiidra   metFORMIN  (GLUCOPHAGE -XR) 500 MG 24 hr tablet Take 1 tablet in the morning and two tablets in the evening.   Multiple Vitamins-Minerals (MULTIVITAMIN WITH MINERALS) tablet Take 1 tablet by mouth daily. Centrum Silver   olmesartan  (BENICAR ) 40 MG tablet Take 1 tablet (40 mg total) by mouth daily.   ONETOUCH VERIO test strip USE AS INSTRUCTED TO CHECK BLOOD SUGAR 3 TIMES DAILY   polyethylene glycol (MIRALAX  / GLYCOLAX ) packet Take 17 g by mouth daily.   SYNTHROID  50 MCG tablet Take 1 tablet (50 mcg total) by mouth daily.   vitamin B-6 (PYRIDOXINE) 25 MG tablet Take 25 mg by mouth daily.   Carboxymethylcellulose Sodium (REFRESH LIQUIGEL) 1 % GEL Place 1 drop into both eyes at bedtime. (Patient not taking: Reported on 06/10/2024)   magnesium  oxide (MAG-OX) 400 (240 Mg) MG tablet TAKE 1 TABLET BY MOUTH EVERY DAY AFTER SUPPER (Patient not taking: Reported on 06/10/2024)   nystatin  powder Apply 1 Application topically 3 (three) times daily. (Patient not taking: Reported on 06/10/2024)   spironolactone  (ALDACTONE ) 25 MG tablet Take 1 tablet (25 mg total) by mouth every morning. (Patient not taking: Reported on 06/10/2024)   traZODone  (DESYREL ) 50 MG tablet Take 1 tablet (50 mg total) by mouth at bedtime. (Patient not taking: Reported on 06/10/2024)   triamcinolone  cream (KENALOG ) 0.1 % APPLY TO AFFECTED AREA TWICE DAILY AS NEEDED (Patient not taking: Reported on 06/10/2024)   No facility-administered encounter medications on file as of 06/10/2024.    History: Past Medical History:  Diagnosis Date   Arthritis    RIGHT KNEE PAIN AND OA   Cataract    Chest pain 05/03/2015   Diabetes mellitus without  complication (HCC)    Elevated cholesterol    Encounter for general adult medical examination w/o abnormal findings 10/23/2023   GERD (gastroesophageal reflux disease)    Glaucoma 2018   History of kidney stones    Hypertension    Hypothyroidism    IBS (irritable bowel syndrome)    Localized swelling of both lower legs    Rotator cuff disorder    BOTH SHOULDERS - NO SURGERY - AND STATES LIMITATIONS IN ARM MOVEMENT   Shortness of breath 05/03/2015   Swelling    CHRONIC SWELLING RIGHT HAND AND BOTH FEET AND BOTH LEGS   Vertigo    Past Surgical History:  Procedure Laterality Date   ABDOMINAL HYSTERECTOMY  1985   BREAST SURGERY  1985   RIGHT BREAST BIOPSY - BENIGN   CARPAL TUNNEL RELEASE Left 02/05/2019   Procedure: LEFT CARPAL TUNNEL RELEASE;  Surgeon: Murrell Kuba, MD;  Location: [facility];  Service: Orthopedics;  Laterality: Left;   CARPAL TUNNEL RELEASE Right 07/25/2021   Procedure: RIGHT CARPAL TUNNEL RELEASE;  Surgeon: Murrell Kuba, MD;  Location: [facility];  Service: Orthopedics;  Laterality: Right;  Bier block   CHOLECYSTECTOMY  1975   EYE SURGERY  1995   BILATERAL CATARACT EXTRACTIONS; SURGERY FOR MACULAR HOLE  HEMORRHOID SURGERY  1975   JOINT REPLACEMENT     PARATHYROIDECTOMY Right 04/12/2022   Procedure: RIGHT INFERIOR PARATHYROIDECTOMY;  Surgeon: Eletha Boas, MD;  Location: [facility];  Service: General;  Laterality: Right;   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   Procedure: RIGHT TOTAL KNEE ARTHROPLASTY;  Surgeon: Dempsey LULLA Moan, MD;  Location: [facility];  Service: Orthopedics;  Laterality: Right;   Family History  Problem Relation Age of Onset   Diabetes Mother    Dementia Mother    Diabetes Father    Diabetes Sister    Breast cancer Sister    Kidney cancer Sister    Cancer Sister    Lung cancer Sister    Lung cancer Maternal Aunt    Pancreatic cancer Maternal Aunt    Cancer Other    Cancer Sister    Diabetes Sister    Social  History   Occupational History   Occupation: Retired  Tobacco Use   Smoking status: Never   Smokeless tobacco: Never  Vaping Use   Vaping status: Never Used  Substance and Sexual Activity   Alcohol use: Not Currently    Comment: Rare   Drug use: No   Sexual activity: Not Currently    Birth control/protection: Surgical   Tobacco Counseling Counseling given: Not Answered  SDOH Screenings   Food Insecurity: No Food Insecurity (06/07/2024)  Housing: Low Risk  (06/07/2024)  Transportation Needs: No Transportation Needs (06/07/2024)  Utilities: Not At Risk (06/10/2024)  Alcohol Screen: Low Risk  (06/07/2024)  Depression (PHQ2-9): Low Risk  (06/10/2024)  Financial Resource Strain: Low Risk  (06/07/2024)  Physical Activity: Sufficiently Active (06/07/2024)  Social Connections: Moderately Isolated (06/07/2024)  Stress: No Stress Concern Present (06/07/2024)  Tobacco Use: Low Risk  (06/10/2024)  Health Literacy: Adequate Health Literacy (06/10/2024)   See flowsheets for full screening details  Depression Screen PHQ 2 & 9 Depression Scale- Over the past 2 weeks, how often have you been bothered by any of the following problems? Little interest or pleasure in doing things: 0 Feeling down, depressed, or hopeless (PHQ Adolescent also includes...irritable): 0 PHQ-2 Total Score: 0 Trouble falling or staying asleep, or sleeping too much: 3 (up going to the bathroom) Feeling tired or having little energy: 0 Poor appetite or overeating (PHQ Adolescent also includes...weight loss): 0 Feeling bad about yourself - or that you are a failure or have let yourself or your family down: 0 Trouble concentrating on things, such as reading the newspaper or watching television (PHQ Adolescent also includes...like school work): 0 Moving or speaking so slowly that other people could have noticed. Or the opposite - being so fidgety or restless that you have been moving around a lot more than usual:  0 Thoughts that you would be better off dead, or of hurting yourself in some way: 0 PHQ-9 Total Score: 3 If you checked off any problems, how difficult have these problems made it for you to do your work, take care of things at home, or get along with other people?: Not difficult at all  Depression Treatment Depression Interventions/Treatment : EYV7-0 Score <4 Follow-up Not Indicated     Goals Addressed             This Visit's Progress    Patient Stated       06/10/2024, denies goals             Objective:    Today's Vitals   06/10/24 0854  BP: 128/68  Pulse: 90  Temp: 97.8 F (36.6 C)  TempSrc: Oral  SpO2: 97%  Weight: 220 lb 3.2 oz (99.9 kg)  Height: 5' 3 (1.6 m)   Body mass index is 39.01 kg/m.  Hearing/Vision screen Hearing Screening - Comments:: Denies hearing issues Vision Screening - Comments:: Regular eye exams, Dr. Cyrus Immunizations and Health Maintenance Health Maintenance  Topic Date Due   COVID-19 Vaccine (6 - 2025-26 season) 03/09/2024   HEMOGLOBIN A1C  08/29/2024   Diabetic kidney evaluation - Urine ACR  10/22/2024   FOOT EXAM  10/22/2024   Mammogram  12/29/2024   OPHTHALMOLOGY EXAM  02/12/2025   Diabetic kidney evaluation - eGFR measurement  02/26/2025   Medicare Annual Wellness (AWV)  06/10/2025   DTaP/Tdap/Td (2 - Td or Tdap) 08/15/2026   Pneumococcal Vaccine: 50+ Years  Completed   Influenza Vaccine  Completed   Bone Density Scan  Completed   Zoster Vaccines- Shingrix  Completed   Meningococcal B Vaccine  Aged Out        Assessment/Plan:  This is a routine wellness examination for [name].  Patient Care Team: Jarold Medici, MD as PCP - General (Internal Medicine) Ladona Heinz, MD as PCP - Cardiology (Cardiology) Cyrus Carwin, MD as Consulting Physician (Ophthalmology)  I have personally reviewed and noted the following in the patient's chart:   Medical and social history Use of alcohol, tobacco or illicit drugs  Current  medications and supplements including opioid prescriptions. Functional ability and status Nutritional status Physical activity Advanced directives List of other physicians Hospitalizations, surgeries, and ER visits in previous 12 months Vitals Screenings to include cognitive, depression, and falls Referrals and appointments  No orders of the defined types were placed in this encounter.  In addition, I have reviewed and discussed with patient certain preventive protocols, quality metrics, and best practice recommendations. A written personalized care plan for preventive services as well as general preventive health recommendations were provided to patient.   Ardella FORBES Dawn, LPN   [id number]/12/7972   Return in 1 year (on 06/10/2025).  After Visit Summary: (In Person-Printed) AVS printed and given to the patient  Nurse Notes: none

## 2024-06-10 NOTE — Assessment & Plan Note (Addendum)
 BMI 38 with comorbidities including HTN and DM. She is encouraged to increase her daily activity, aiming for at least 150 minutes of exercise per week.

## 2024-06-10 NOTE — Patient Instructions (Signed)
 Hypertension, Adult Hypertension is another name for high blood pressure. High blood pressure forces your heart to work harder to pump blood. This can cause problems over time. There are two numbers in a blood pressure reading. There is a top number (systolic) over a bottom number (diastolic). It is best to have a blood pressure that is below 120/80. What are the causes? The cause of this condition is not known. Some other conditions can lead to high blood pressure. What increases the risk? Some lifestyle factors can make you more likely to develop high blood pressure: Smoking. Not getting enough exercise or physical activity. Being overweight. Having too much fat, sugar, calories, or salt (sodium) in your diet. Drinking too much alcohol. Other risk factors include: Having any of these conditions: Heart disease. Diabetes. High cholesterol. Kidney disease. Obstructive sleep apnea. Having a family history of high blood pressure and high cholesterol. Age. The risk increases with age. Stress. What are the signs or symptoms? High blood pressure may not cause symptoms. Very high blood pressure (hypertensive crisis) may cause: Headache. Fast or uneven heartbeats (palpitations). Shortness of breath. Nosebleed. Vomiting or feeling like you may vomit (nauseous). Changes in how you see. Very bad chest pain. Feeling dizzy. Seizures. How is this treated? This condition is treated by making healthy lifestyle changes, such as: Eating healthy foods. Exercising more. Drinking less alcohol. Your doctor may prescribe medicine if lifestyle changes do not help enough and if: Your top number is above 130. Your bottom number is above 80. Your personal target blood pressure may vary. Follow these instructions at home: Eating and drinking  If told, follow the DASH eating plan. To follow this plan: Fill one half of your plate at each meal with fruits and vegetables. Fill one fourth of your plate  at each meal with whole grains. Whole grains include whole-wheat pasta, brown rice, and whole-grain bread. Eat or drink low-fat dairy products, such as skim milk or low-fat yogurt. Fill one fourth of your plate at each meal with low-fat (lean) proteins. Low-fat proteins include fish, chicken without skin, eggs, beans, and tofu. Avoid fatty meat, cured and processed meat, or chicken with skin. Avoid pre-made or processed food. Limit the amount of salt in your diet to less than 1,500 mg each day. Do not drink alcohol if: Your doctor tells you not to drink. You are pregnant, may be pregnant, or are planning to become pregnant. If you drink alcohol: Limit how much you have to: 0-1 drink a day for women. 0-2 drinks a day for men. Know how much alcohol is in your drink. In the [place] one drink equals one 12 oz bottle of beer (355 mL), one 5 oz glass of wine (148 mL), or one 1 oz glass of hard liquor (44 mL). Lifestyle  Work with your doctor to stay at a healthy weight or to lose weight. Ask your doctor what the best weight is for you. Get at least 30 minutes of exercise that causes your heart to beat faster (aerobic exercise) most days of the week. This may include walking, swimming, or biking. Get at least 30 minutes of exercise that strengthens your muscles (resistance exercise) at least 3 days a week. This may include lifting weights or doing Pilates. Do not smoke or use any products that contain nicotine or tobacco. If you need help quitting, ask your doctor. Check your blood pressure at home as told by your doctor. Keep all follow-up visits. Medicines Take over-the-counter and prescription medicines  only as told by your doctor. Follow directions carefully. Do not skip doses of blood pressure medicine. The medicine does not work as well if you skip doses. Skipping doses also puts you at risk for problems. Ask your doctor about side effects or reactions to medicines that you should watch  for. Contact a doctor if: You think you are having a reaction to the medicine you are taking. You have headaches that keep coming back. You feel dizzy. You have swelling in your ankles. You have trouble with your vision. Get help right away if: You get a very bad headache. You start to feel mixed up (confused). You feel weak or numb. You feel faint. You have very bad pain in your: Chest. Belly (abdomen). You vomit more than once. You have trouble breathing. These symptoms may be an emergency. Get help right away. Call 911. Do not wait to see if the symptoms will go away. Do not drive yourself to the hospital. Summary Hypertension is another name for high blood pressure. High blood pressure forces your heart to work harder to pump blood. For most people, a normal blood pressure is less than 120/80. Making healthy choices can help lower blood pressure. If your blood pressure does not get lower with healthy choices, you may need to take medicine. This information is not intended to replace advice given to you by your health care provider. Make sure you discuss any questions you have with your health care provider. Document Revised: 04/13/2021 Document Reviewed: 04/13/2021 Elsevier Patient Education  2024 [employer].

## 2024-06-10 NOTE — Assessment & Plan Note (Signed)
 Chronic, controlled. Hypertension managed with olmesartan .  Home blood pressure readings are well-controlled. Consumes salty foods, potentially affecting blood pressure.  - Advise monitoring blood pressure at home and reducing salt intake. - No longer on spironolactone .

## 2024-06-10 NOTE — Progress Notes (Signed)
 I,[name], CMA,acting as a neurosurgeon for Stephanie LOISE Slocumb, MD.,have documented all relevant documentation on the behalf of Stephanie LOISE Slocumb, MD,as directed by  Stephanie LOISE Slocumb, MD while in the presence of Stephanie LOISE Slocumb, MD.  Subjective:  Patient ID: [name] , female    DOB: 06/26/1941 , 83 y.o.   MRN: [id number]  Chief Complaint  Patient presents with   Hypertension    Patient presents today for a diabetes, bp & thyroid  check. She reports compliance with medications. Denies headache, chest pain, and SOB.  She is also scheduled for AWV with [facility] Advisor; [name]. She wants to discuss why Magnesium  had been stopped.    Diabetes   Hypothyroidism    HPI Discussed the use of AI scribe software for clinical note transcription with the patient, who gave verbal consent to proceed.  History of Present Illness [name] is an 83 year old female with diabetes who presents for a diabetes check.  She checks her blood sugar once daily and is concerned about running out of lancets before her refill time. She needs test strips. She manages her diabetes with metformin , which she continues to take.  She has a history of hypertension and was previously on spironolactone , which she has not taken for six months. She monitors her blood pressure at home. She is currently taking olmesartan  daily for blood pressure management.  She is not taking trazodone , which was prescribed for sleep, but continues to take Miralax , Lasix , and Pepcid. She denies symptoms of heartburn. She experiences cramps in her feet but denies tingling in her hands or feet. She drinks two to three bottles of water daily and sometimes tea.  She is taking a brand thyroid  medication at a dose of 50 micrograms daily. She has gained six pounds since October, attributing the weight gain to overeating during Thanksgiving. She is attempting to exercise three days a week despite experiencing vertigo, particularly in the  pool.   Hypertension This is a chronic problem. The current episode started more than 1 year ago. The problem has been gradually improving since onset. The problem is controlled. Pertinent negatives include no blurred vision. Agents associated with hypertension include thyroid  hormones. Risk factors for coronary artery disease include dyslipidemia, obesity, post-menopausal state and sedentary lifestyle. Past treatments include angiotensin blockers. The current treatment provides moderate improvement. Compliance problems include exercise.   Diabetes She presents for her follow-up diabetic visit. She has type 2 diabetes mellitus. Her disease course has been stable. There are no hypoglycemic associated symptoms. There are no diabetic associated symptoms. Pertinent negatives for diabetes include no blurred vision, no polydipsia, no polyphagia and no polyuria. There are no hypoglycemic complications. Risk factors for coronary artery disease include diabetes mellitus, dyslipidemia, hypertension, obesity, post-menopausal and sedentary lifestyle. She participates in exercise intermittently. Eye exam is current.     Past Medical History:  Diagnosis Date   Arthritis    RIGHT KNEE PAIN AND OA   Cataract    Chest pain 05/03/2015   Diabetes mellitus without complication (HCC)    Elevated cholesterol    Encounter for general adult medical examination w/o abnormal findings 10/23/2023   GERD (gastroesophageal reflux disease)    Glaucoma 2018   History of kidney stones    Hypertension    Hypothyroidism    IBS (irritable bowel syndrome)    Localized swelling of both lower legs    Rotator cuff disorder    BOTH SHOULDERS - NO SURGERY - AND  STATES LIMITATIONS IN ARM MOVEMENT   Shortness of breath 05/03/2015   Swelling    CHRONIC SWELLING RIGHT HAND AND BOTH FEET AND BOTH LEGS   Vertigo      Family History  Problem Relation Age of Onset   Diabetes Mother    Dementia Mother    Diabetes Father     Diabetes Sister    Breast cancer Sister    Kidney cancer Sister    Cancer Sister    Lung cancer Sister    Lung cancer Maternal Aunt    Pancreatic cancer Maternal Aunt    Cancer Other    Cancer Sister    Diabetes Sister      Current Outpatient Medications:    aspirin EC 81 MG tablet, Take 81 mg by mouth daily. Swallow whole., Disp: , Rfl:    bimatoprost (LUMIGAN) 0.01 % SOLN, Place 1 drop into both eyes at bedtime. , Disp: , Rfl:    Blood Glucose Monitoring Suppl (ONETOUCH VERIO FLEX SYSTEM) w/Device KIT, USE TO CHECK BLOOD SUGAR 3  TIMES DAILY, Disp: 1 kit, Rfl: 1   Carboxymethylcellulose Sodium (REFRESH LIQUIGEL) 1 % GEL, Place 1 drop into both eyes at bedtime. (Patient not taking: Reported on 06/10/2024), Disp: , Rfl:    CRESTOR  20 MG tablet, Take 1 tablet (20 mg total) by mouth at bedtime., Disp: , Rfl:    famotidine (PEPCID) 20 MG tablet, Take 20 mg by mouth daily with breakfast., Disp: , Rfl:    furosemide  (LASIX ) 20 MG tablet, Take 1 tablet (20 mg total) by mouth every morning., Disp: 90 tablet, Rfl: 3   glucose blood (ONETOUCH VERIO) test strip, USE AS INSTRUCTED TO CHECK  BLOOD SUGAR 3 TIMES DAILY, Disp: 300 strip, Rfl: 3   hydroquinone 4 % cream, Apply topically daily., Disp: , Rfl:    Lancets (ONETOUCH DELICA PLUS LANCET33G) MISC, USE LANCETS ONCE DAILY TO CHECK BLOOD SUGARS., Disp: 300 each, Rfl: 3   Lifitegrast (XIIDRA) 5 % SOLN, Place 1 drop into both eyes daily. xiidra, Disp: , Rfl:    magnesium  oxide (MAG-OX) 400 (240 Mg) MG tablet, TAKE 1 TABLET BY MOUTH EVERY DAY AFTER SUPPER (Patient not taking: Reported on 06/10/2024), Disp: 90 tablet, Rfl: 1   metFORMIN  (GLUCOPHAGE -XR) 500 MG 24 hr tablet, Take 1 tablet in the morning and two tablets in the evening., Disp: 270 tablet, Rfl: 3   Multiple Vitamins-Minerals (MULTIVITAMIN WITH MINERALS) tablet, Take 1 tablet by mouth daily. Centrum Silver, Disp: , Rfl:    nystatin  powder, Apply 1 Application topically 3 (three) times daily.  (Patient not taking: Reported on 06/10/2024), Disp: 45 g, Rfl: 0   olmesartan  (BENICAR ) 40 MG tablet, Take 1 tablet (40 mg total) by mouth daily., Disp: , Rfl:    polyethylene glycol (MIRALAX  / GLYCOLAX ) packet, Take 17 g by mouth daily., Disp: , Rfl:    SYNTHROID  50 MCG tablet, Take 1 tablet (50 mcg total) by mouth daily., Disp: , Rfl:    triamcinolone  cream (KENALOG ) 0.1 %, APPLY TO AFFECTED AREA TWICE DAILY AS NEEDED (Patient not taking: Reported on 06/10/2024), Disp: 45 g, Rfl: 0   vitamin B-6 (PYRIDOXINE) 25 MG tablet, Take 25 mg by mouth daily., Disp: , Rfl:    Allergies  Allergen Reactions   Codeine Nausea And Vomiting   Tramadol Nausea And Vomiting   Macrobid [Nitrofurantoin Macrocrystal] Rash     Review of Systems  Constitutional: Negative.   Eyes:  Negative for blurred vision.  Respiratory: Negative.  Cardiovascular: Negative.   Endocrine: Negative for polydipsia, polyphagia and polyuria.  Neurological: Negative.   Psychiatric/Behavioral: Negative.       Today's Vitals   06/10/24 0912  BP: 128/68  Pulse: 90  Temp: 97.8 F (36.6 C)  SpO2: 98%  Weight: 220 lb (99.8 kg)  Height: 5' 3 (1.6 m)   Body mass index is 38.97 kg/m.  Wt Readings from Last 3 Encounters:  06/10/24 220 lb (99.8 kg)  06/10/24 220 lb 3.2 oz (99.9 kg)  04/27/24 214 lb (97.1 kg)    BP Readings from Last 3 Encounters:  06/10/24 128/68  06/10/24 128/68  02/27/24 110/70    The ASCVD Risk score (Arnett DK, et al., 2019) failed to calculate for the following reasons:   The 2019 ASCVD risk score is only valid for ages 86 to 22  Objective:  Physical Exam Vitals and nursing note reviewed.  Constitutional:      Appearance: Normal appearance. She is obese.  HENT:     Head: Normocephalic and atraumatic.  Eyes:     Extraocular Movements: Extraocular movements intact.  Cardiovascular:     Rate and Rhythm: Normal rate and regular rhythm.     Heart sounds: Normal heart sounds.  Pulmonary:      Effort: Pulmonary effort is normal.     Breath sounds: Normal breath sounds.  Musculoskeletal:     Cervical back: Normal range of motion.  Skin:    General: Skin is warm.  Neurological:     General: No focal deficit present.     Mental Status: She is alert.  Psychiatric:        Mood and Affect: Mood normal.        Behavior: Behavior normal.         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 Hypertension managed with olmesartan .  Home blood pressure readings are well-controlled. Consumes salty foods, potentially affecting blood pressure.  - Advise monitoring blood pressure at home and reducing salt intake. - No longer on spironolactone .  Aortic atherosclerosis Chronic, LDL goal <70. She will c/w ASA 81mg  and rosuvastatin  20mg  daily.  She is encouraged to follow a heart healthy lifestyle.  Dyslipidemia associated with type 2 diabetes mellitus (HCC) Blood glucose levels elevated, 130-150 mg/dL. Dietary choices may contribute.   - Managed with metformin . Not on insulin. Difficulty obtaining lancets and test strips. Inquired about 'Free Sugar Pro' supplement, no recommendation made. - Sent prescription for lancets and test strips to [facility]. - Continue metformin  twice daily. - Encourage dietary modifications to reduce sweets and snacks before bed. - Schedule regular blood work including A1c. - Continue with rosuvastatin , LDL goal is less than 70 Primary hypothyroidism Chronic, currently on synthroid  50mcg daily.  - I will check thyroid  panel and adjust meds as needed.  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 She is encouraged to increase her daily activity, aiming for at least 150 minutes of exercise per week.   Orders Placed This Encounter  Procedures   CMP14+EGFR   Hemoglobin A1c   TSH   Lipid panel     Return if symptoms worsen or fail to improve.   Patient was given opportunity to ask questions. Patient verbalized understanding of the plan and was able to repeat key elements of the plan. All questions were answered to their satisfaction.    I, Stephanie LOISE Slocumb, MD, have reviewed all documentation  for this visit. The documentation on 06/10/24 for the exam, diagnosis, procedures, and orders are all accurate and complete.   IF YOU HAVE BEEN REFERRED TO A SPECIALIST, IT MAY TAKE 1-2 WEEKS TO SCHEDULE/PROCESS THE REFERRAL. IF YOU HAVE NOT HEARD FROM US /SPECIALIST IN TWO WEEKS, PLEASE GIVE US  A CALL AT [phone].

## 2024-06-10 NOTE — Patient Instructions (Signed)
 [name],  Thank you for taking the time for your Medicare Wellness Visit. I appreciate your continued commitment to your health goals. Please review the care plan we discussed, and feel free to reach out if I can assist you further.  Please note that Annual Wellness Visits do not include a physical exam. Some assessments may be limited, especially if the visit was conducted virtually. If needed, we may recommend an in-person follow-up with your provider.  Ongoing Care Seeing your primary care provider every 3 to 6 months helps us  monitor your health and provide consistent, personalized care.   Referrals If a referral was made during today's visit and you haven't received any updates within two weeks, please contact the referred provider directly to check on the status.  Recommended Screenings:  Health Maintenance  Topic Date Due   Flu Shot  02/07/2024   COVID-19 Vaccine (6 - 2025-26 season) 03/09/2024   Medicare Annual Wellness Visit  05/21/2024   Hemoglobin A1C  08/29/2024   Yearly kidney health urinalysis for diabetes  10/22/2024   Complete foot exam   10/22/2024   Breast Cancer Screening  12/29/2024   Eye exam for diabetics  02/12/2025   Yearly kidney function blood test for diabetes  02/26/2025   DTaP/Tdap/Td vaccine (2 - Td or Tdap) 08/15/2026   Pneumococcal Vaccine for age over 63  Completed   Osteoporosis screening with Bone Density Scan  Completed   Zoster (Shingles) Vaccine  Completed   Meningitis B Vaccine  Aged Out       06/10/2024    9:00 AM  Advanced Directives  Does Patient Have a Medical Advance Directive? Yes  Type of Advance Directive Living will;Healthcare Power of Attorney  Does patient want to make changes to medical advance directive? No - Patient declined  Copy of Healthcare Power of Attorney in Chart? Yes - validated most recent copy scanned in chart (See row information)    Vision: Annual vision screenings are recommended for early detection of  glaucoma, cataracts, and diabetic retinopathy. These exams can also reveal signs of chronic conditions such as diabetes and high blood pressure.  Dental: Annual dental screenings help detect early signs of oral cancer, gum disease, and other conditions linked to overall health, including heart disease and diabetes.  Please see the attached documents for additional preventive care recommendations.

## 2024-06-10 NOTE — Assessment & Plan Note (Signed)
 Chronic, currently on synthroid  50mcg daily.  - I will check thyroid  panel and adjust meds as needed.

## 2024-06-10 NOTE — Assessment & Plan Note (Signed)
 Chronic, LDL goal <70. She will c/w ASA 81mg  and rosuvastatin 20mg  daily.  She is encouraged to follow a heart healthy lifestyle.

## 2024-06-11 LAB — LIPID PANEL
Chol/HDL Ratio: 2.2 ratio (ref 0.0–4.4)
Cholesterol, Total: 138 mg/dL (ref 100–199)
HDL: 63 mg/dL (ref >39)
LDL Chol Calc (NIH): 64 mg/dL (ref 0–99)
Triglycerides: 49 mg/dL (ref 0–149)
VLDL Cholesterol Cal: 11 mg/dL (ref 5–40)

## 2024-06-11 LAB — CMP14+EGFR
ALT: 22 IU/L (ref 0–32)
AST: 21 IU/L (ref 0–40)
Albumin: 4 g/dL (ref 3.7–4.7)
Alkaline Phosphatase: 82 IU/L (ref 48–129)
BUN/Creatinine Ratio: 24 (ref 12–28)
BUN: 15 mg/dL (ref 8–27)
Bilirubin Total: 0.6 mg/dL (ref 0.0–1.2)
CO2: 25 mmol/L (ref 20–29)
Calcium: 9.4 mg/dL (ref 8.7–10.3)
Chloride: 104 mmol/L (ref 96–106)
Creatinine, Ser: 0.63 mg/dL (ref 0.57–1.00)
Globulin, Total: 2 g/dL (ref 1.5–4.5)
Glucose: 109 mg/dL — ABNORMAL HIGH (ref 70–99)
Potassium: 4.3 mmol/L (ref 3.5–5.2)
Sodium: 142 mmol/L (ref 134–144)
Total Protein: 6 g/dL (ref 6.0–8.5)
eGFR: 87 mL/min/1.73 (ref >59)

## 2024-06-11 LAB — HEMOGLOBIN A1C
Est. average glucose Bld gHb Est-mCnc: 151 mg/dL
Hgb A1c MFr Bld: 6.9 % — ABNORMAL HIGH (ref 4.8–5.6)

## 2024-06-11 LAB — TSH: TSH: 1.15 u[IU]/mL (ref 0.450–4.500)

## 2024-06-12 ENCOUNTER — Ambulatory Visit: Payer: Self-pay | Admitting: Internal Medicine

## 2024-06-15 ENCOUNTER — Other Ambulatory Visit: Payer: Self-pay

## 2024-06-15 DIAGNOSIS — E1169 Type 2 diabetes mellitus with other specified complication: Secondary | ICD-10-CM

## 2024-06-15 MED ORDER — ACCU-CHEK GUIDE ME W/DEVICE KIT: PACK | 1 refills | Status: AC

## 2024-06-29 ENCOUNTER — Other Ambulatory Visit: Payer: Self-pay

## 2024-06-29 ENCOUNTER — Emergency Department (HOSPITAL_BASED_OUTPATIENT_CLINIC_OR_DEPARTMENT_OTHER)

## 2024-06-29 ENCOUNTER — Emergency Department (HOSPITAL_BASED_OUTPATIENT_CLINIC_OR_DEPARTMENT_OTHER)
Admission: EM | Admit: 2024-06-29 | Discharge: 2024-06-29 | Disposition: A | Discharge status: Home / Self Care | Attending: Emergency Medicine | Admitting: Emergency Medicine

## 2024-06-29 ENCOUNTER — Encounter (HOSPITAL_BASED_OUTPATIENT_CLINIC_OR_DEPARTMENT_OTHER): Payer: Self-pay

## 2024-06-29 DIAGNOSIS — E039 Hypothyroidism, unspecified: Secondary | ICD-10-CM | POA: Insufficient documentation

## 2024-06-29 DIAGNOSIS — Z7982 Long term (current) use of aspirin: Secondary | ICD-10-CM | POA: Insufficient documentation

## 2024-06-29 DIAGNOSIS — E119 Type 2 diabetes mellitus without complications: Secondary | ICD-10-CM | POA: Diagnosis not present

## 2024-06-29 DIAGNOSIS — Z7984 Long term (current) use of oral hypoglycemic drugs: Secondary | ICD-10-CM | POA: Diagnosis not present

## 2024-06-29 DIAGNOSIS — I10 Essential (primary) hypertension: Secondary | ICD-10-CM | POA: Diagnosis not present

## 2024-06-29 DIAGNOSIS — L03116 Cellulitis of left lower limb: Secondary | ICD-10-CM | POA: Insufficient documentation

## 2024-06-29 DIAGNOSIS — R2242 Localized swelling, mass and lump, left lower limb: Secondary | ICD-10-CM | POA: Diagnosis present

## 2024-06-29 LAB — CBC
HCT: 40.7 % (ref 36.0–46.0)
Hemoglobin: 13.7 g/dL (ref 12.0–15.0)
MCH: 30.2 pg (ref 26.0–34.0)
MCHC: 33.7 g/dL (ref 30.0–36.0)
MCV: 89.8 fL (ref 80.0–100.0)
Platelets: 177 K/uL (ref 150–400)
RBC: 4.53 MIL/uL (ref 3.87–5.11)
RDW: 14.2 % (ref 11.5–15.5)
WBC: 4.1 K/uL (ref 4.0–10.5)
nRBC: 0 % (ref 0.0–0.2)

## 2024-06-29 LAB — BASIC METABOLIC PANEL WITH GFR
Anion gap: 13 (ref 5–15)
BUN: 12 mg/dL (ref 8–23)
CO2: 25 mmol/L (ref 22–32)
Calcium: 9 mg/dL (ref 8.9–10.3)
Chloride: 104 mmol/L (ref 98–111)
Creatinine, Ser: 0.63 mg/dL (ref 0.44–1.00)
GFR, Estimated: >60 mL/min
Glucose, Bld: 164 mg/dL — ABNORMAL HIGH (ref 70–99)
Potassium: 3.8 mmol/L (ref 3.5–5.1)
Sodium: 141 mmol/L (ref 135–145)

## 2024-06-29 MED ORDER — SODIUM CHLORIDE 0.9 % IV SOLN
1.0000 g | Freq: Once | INTRAVENOUS | Status: AC
  Administered 2024-06-29: 1 g via INTRAVENOUS
  Filled 2024-06-29: qty 10

## 2024-06-29 MED ORDER — ACETAMINOPHEN 325 MG PO TABS
650.0000 mg | ORAL_TABLET | Freq: Once | ORAL | Status: AC
  Administered 2024-06-29: 650 mg via ORAL
  Filled 2024-06-29: qty 2

## 2024-06-29 MED ORDER — CEPHALEXIN 500 MG PO CAPS: 500.0000 mg | ORAL_CAPSULE | Freq: Four times a day (QID) | ORAL | 0 refills | Status: AC

## 2024-06-29 NOTE — ED Provider Notes (Signed)
 " [facility] Provider Note   CSN: [id number] Arrival date & time: 06/29/24  9051     Patient presents with: Leg Swelling   [name] is a 83 y.o. female.   HPI   Patient has a history of hypertension elevated cholesterol, acid reflux, irritable bowel syndrome, hypothyroidism, lymphedema, diabetes.  Patient states she started having trouble with leg swelling over the last few days.  Patient states the swelling is in the left leg.  It has been getting worse in severity.  She has noticed redness in her leg.  It is tender to touch.  She has noticed some warmth.  She is not having any fevers.  No chest pain or shortness of breath.  Patient states she went to an urgent care.  They gave her a prescription for a blood thinning medication that starts within ED and instructed her to get further testing.  Patient came to the ED today for this  Prior to Admission medications  Medication Sig Start Date End Date Taking? Authorizing Provider  cephALEXin  (KEFLEX ) 500 MG capsule Take 1 capsule (500 mg total) by mouth 4 (four) times daily for 7 days. 06/29/24 07/06/24 Yes Randol Simmonds, MD  aspirin EC 81 MG tablet Take 81 mg by mouth daily. Swallow whole.    [provider]  bimatoprost (LUMIGAN) 0.01 % SOLN Place 1 drop into both eyes at bedtime.     [provider]  Blood Glucose Monitoring Suppl (ACCU-CHEK GUIDE ME) w/Device KIT USE ONCE DAILY TO CHECK BLOOD SUGARS. 06/15/24   Jarold Medici, MD  Blood Glucose Monitoring Suppl (ONETOUCH VERIO FLEX SYSTEM) w/Device KIT USE TO CHECK BLOOD SUGAR 3  TIMES DAILY 11/07/21   Jarold Medici, MD  Carboxymethylcellulose Sodium (REFRESH LIQUIGEL) 1 % GEL Place 1 drop into both eyes at bedtime. Patient not taking: Reported on 06/10/2024    [provider]  CRESTOR  20 MG tablet Take 1 tablet (20 mg total) by mouth at bedtime. 02/27/24   Jarold Medici, MD  famotidine (PEPCID) 20 MG tablet Take 20 mg  by mouth daily with breakfast.    [provider]  furosemide  (LASIX ) 20 MG tablet Take 1 tablet (20 mg total) by mouth every morning. 10/23/23   Jarold Medici, MD  glucose blood [facility] VERIO) test strip USE AS INSTRUCTED TO CHECK  BLOOD SUGAR 3 TIMES DAILY 06/10/24   Jarold Medici, MD  hydroquinone 4 % cream Apply topically daily. 09/21/23   [provider]  Lancets (ONETOUCH DELICA PLUS LANCET33G) MISC USE LANCETS ONCE DAILY TO CHECK BLOOD SUGARS. 06/10/24   Jarold Medici, MD  Lifitegrast CARON) 5 % SOLN Place 1 drop into both eyes daily. xiidra    [provider]  magnesium  oxide (MAG-OX) 400 (240 Mg) MG tablet TAKE 1 TABLET BY MOUTH EVERY DAY AFTER SUPPER Patient not taking: Reported on 06/10/2024 10/15/23   Jarold Medici, MD  metFORMIN  (GLUCOPHAGE -XR) 500 MG 24 hr tablet Take 1 tablet in the morning and two tablets in the evening. 03/24/24   Jarold Medici, MD  Multiple Vitamins-Minerals (MULTIVITAMIN WITH MINERALS) tablet Take 1 tablet by mouth daily. Centrum Silver    [provider]  nystatin  powder Apply 1 Application topically 3 (three) times daily. Patient not taking: Reported on 06/10/2024 10/22/22   Jarold Medici, MD  olmesartan  (BENICAR ) 40 MG tablet Take 1 tablet (40 mg total) by mouth daily. 02/27/24   Jarold Medici, MD  polyethylene glycol (MIRALAX  / GLYCOLAX )  packet Take 17 g by mouth daily.    [provider]  SYNTHROID  50 MCG tablet Take 1 tablet (50 mcg total) by mouth daily. 02/27/24   Jarold Medici, MD  triamcinolone  cream (KENALOG ) 0.1 % APPLY TO AFFECTED AREA TWICE DAILY AS NEEDED Patient not taking: Reported on 06/10/2024 05/02/22   Jarold Medici, MD  vitamin B-6 (PYRIDOXINE) 25 MG tablet Take 25 mg by mouth daily.    [provider]    Allergies: Codeine, Tramadol, and Macrobid [nitrofurantoin macrocrystal]    Review of Systems  Updated Vital Signs BP (!) 142/52 (BP Location: Left Arm)   Pulse 76   Temp 97.9  F (36.6 C) (Oral)   Resp 16   Ht 1.6 m (5' 3)   Wt 96.2 kg   SpO2 100%   BMI 37.55 kg/m   Physical Exam Vitals and nursing note reviewed.  Constitutional:      General: She is not in acute distress.    Appearance: She is well-developed.  HENT:     Head: Normocephalic and atraumatic.     Right Ear: External ear normal.     Left Ear: External ear normal.  Eyes:     General: No scleral icterus.       Right eye: No discharge.        Left eye: No discharge.     Conjunctiva/sclera: Conjunctivae normal.  Neck:     Trachea: No tracheal deviation.  Cardiovascular:     Rate and Rhythm: Normal rate and regular rhythm.  Pulmonary:     Effort: Pulmonary effort is normal. No respiratory distress.     Breath sounds: Normal breath sounds. No stridor. No wheezing or rales.  Abdominal:     General: Bowel sounds are normal. There is no distension.     Palpations: Abdomen is soft.     Tenderness: There is no abdominal tenderness. There is no guarding or rebound.  Musculoskeletal:        General: Tenderness present. No deformity.     Cervical back: Neck supple.     Left lower leg: Edema present.     Comments: Erythema and edema of the left lower extremity extends up above the knee, calf tenderness  Skin:    General: Skin is warm and dry.     Findings: No rash.  Neurological:     General: No focal deficit present.     Mental Status: She is alert.     Cranial Nerves: No cranial nerve deficit, dysarthria or facial asymmetry.     Sensory: No sensory deficit.     Motor: No abnormal muscle tone or seizure activity.     Coordination: Coordination normal.  Psychiatric:        Mood and Affect: Mood normal.     (all labs ordered are listed, but only abnormal results are displayed) Labs Reviewed  BASIC METABOLIC PANEL WITH GFR - Abnormal; Notable for the following components:      Result Value   Glucose, Bld 164 (*)    All other components within normal limits  CBC     EKG: None  Radiology: US  Venous Img Lower  Left (DVT Study) Result Date: 06/29/2024 CLINICAL DATA:  Left lower extremity pain, edema and erythema. EXAM: LEFT LOWER EXTREMITY VENOUS DOPPLER ULTRASOUND TECHNIQUE: Gray-scale sonography with graded compression, as well as color Doppler and duplex ultrasound were performed to evaluate the lower extremity deep venous systems from the level of the common femoral vein and including the  common femoral, femoral, profunda femoral, popliteal and calf veins including the posterior tibial, peroneal and gastrocnemius veins when visible. The superficial great saphenous vein was also interrogated. Spectral Doppler was utilized to evaluate flow at rest and with distal augmentation maneuvers in the common femoral, femoral and popliteal veins. COMPARISON:  None Available. FINDINGS: Contralateral Common Femoral Vein: Respiratory phasicity is normal and symmetric with the symptomatic side. No evidence of thrombus. Normal compressibility. Common Femoral Vein: No evidence of thrombus. Normal compressibility, respiratory phasicity and response to augmentation. Saphenofemoral Junction: No evidence of thrombus. Normal compressibility and flow on color Doppler imaging. Profunda Femoral Vein: No evidence of thrombus. Normal compressibility and flow on color Doppler imaging. Femoral Vein: No evidence of thrombus. Normal compressibility, respiratory phasicity and response to augmentation. Popliteal Vein: No evidence of thrombus. Normal compressibility, respiratory phasicity and response to augmentation. Calf Veins: No evidence of thrombus. Normal compressibility and flow on color Doppler imaging. Superficial Great Saphenous Vein: No evidence of thrombus. Normal compressibility. Venous Reflux:  None. Other Findings: No evidence of superficial thrombophlebitis or abnormal fluid collection. IMPRESSION: No evidence of left lower extremity deep venous thrombosis. Electronically Signed    By: Marcey Moan M.D.   On: 06/29/2024 12:15     Procedures   Medications Ordered in the ED  cefTRIAXone  (ROCEPHIN ) 1 g in sodium chloride  0.9 % 100 mL IVPB (has no administration in time range)    Clinical Course as of 06/29/24 1309  Mon Jun 29, 2024  1153 Basic metabolic panel(!) CBC metabolic panel normal [JK]  1219 US  Venous Img Lower  Left (DVT Study) Doppler study negative for DVT [JK]    Clinical Course User Index [JK] Randol Simmonds, MD                                 Medical Decision Making Problems Addressed: Cellulitis of left lower extremity: acute illness or injury that poses a threat to life or bodily functions  Amount and/or Complexity of Data Reviewed Labs: ordered. Decision-making details documented in ED Course. Radiology:  Decision-making details documented in ED Course.  Risk OTC drugs. Prescription drug management.   Patient presented to the ED with complaints of lower extremity swelling.  Patient went to an urgent care, and it sounds like they were concerned about possible DVT.  Patient's Doppler study here is negative for DVT.  Her blood tests are reassuring.  No significant leukocytosis.  Patient is nontoxic and afebrile.  Exam is consistent with cellulitis but she appears appropriate for outpatient management.  Patient was given a dose of Rocephin  in the ED.  Will discharge home on antsiness.  Warning signs and precautions discussed     Final diagnoses:  Cellulitis of left lower extremity    ED Discharge Orders          Ordered    cephALEXin  (KEFLEX ) 500 MG capsule  4 times daily        06/29/24 1307               Randol Simmonds, MD 07/02/24 0827  "

## 2024-06-29 NOTE — ED Triage Notes (Signed)
 Pt states that she has noted some swelling to her left lower leg that is now red and painful to the touch.  Redness noted. Leg is warm to touch.

## 2024-06-29 NOTE — Discharge Instructions (Signed)
 Take the antibiotics as prescribed to treat your skin cellulitis.  Try to keep your leg elevated.  Follow-up with your doctor next week to be rechecked.  Return to the emergency room if you start having trouble with high fevers, worsening symptoms.  You should start to notice some improvement in the next couple of days, but it will take longer for the infection to completely resolve

## 2024-07-08 ENCOUNTER — Encounter: Payer: Self-pay | Admitting: Nurse Practitioner

## 2024-07-08 ENCOUNTER — Ambulatory Visit (INDEPENDENT_AMBULATORY_CARE_PROVIDER_SITE_OTHER): Payer: Self-pay | Admitting: Nurse Practitioner

## 2024-07-08 VITALS — BP 124/60 | HR 72 | Temp 97.6°F | Ht 63.0 in | Wt 220.4 lb

## 2024-07-08 DIAGNOSIS — Z6839 Body mass index (BMI) 39.0-39.9, adult: Secondary | ICD-10-CM | POA: Diagnosis not present

## 2024-07-08 DIAGNOSIS — E66812 Obesity, class 2: Secondary | ICD-10-CM | POA: Diagnosis not present

## 2024-07-08 DIAGNOSIS — E6609 Other obesity due to excess calories: Secondary | ICD-10-CM

## 2024-07-08 DIAGNOSIS — L03116 Cellulitis of left lower limb: Secondary | ICD-10-CM | POA: Diagnosis not present

## 2024-07-08 NOTE — Progress Notes (Signed)
 LILLETTE Kristeen JINNY Gladis, CMA,acting as a neurosurgeon for Stephanie Ada, FNP.,have documented all relevant documentation on the behalf of Stephanie Ada, FNP,as directed by  Stephanie Ada, FNP while in the presence of Stephanie Ada, FNP.  Subjective:  Patient ID: [name] , female    DOB: [birth year] , 83 y.o.   MRN: [id number]  Chief Complaint  Patient presents with   Hospitalization Follow-up    Patient presents today for a hospital follow up, patient reports compliance with medications. Patient denies any chest pain, SOB, or headaches. Patient was admitted 06/29/2024 and discharged same day. Patient was diagnosed with Cellulitis of left lower extremity. Patient reports today they are feeling better and has no concerns today.      Here for ER f/u on 12/22 for cellulitis of her left leg. She was treated with Rocephin  and given cephalexin . Continues to have swelling but the redness is better. Blood sugars were 134 this morning usually 120-130.      Past Medical History:  Diagnosis Date   Arthritis    RIGHT KNEE PAIN AND OA   Cataract    Chest pain 05/03/2015   Diabetes mellitus without complication (HCC)    Elevated cholesterol    Encounter for general adult medical examination w/o abnormal findings 10/23/2023   GERD (gastroesophageal reflux disease)    Glaucoma 2018   History of kidney stones    Hypertension    Hypothyroidism    IBS (irritable bowel syndrome)    Localized swelling of both lower legs    Rotator cuff disorder    BOTH SHOULDERS - NO SURGERY - AND STATES LIMITATIONS IN ARM MOVEMENT   Shortness of breath 05/03/2015   Swelling    CHRONIC SWELLING RIGHT HAND AND BOTH FEET AND BOTH LEGS   Vertigo      Family History  Problem Relation Age of Onset   Diabetes Mother    Dementia Mother    Diabetes Father    Diabetes Sister    Breast cancer Sister    Kidney cancer Sister    Cancer Sister    Lung cancer Sister    Lung cancer Maternal Aunt    Pancreatic cancer Maternal Aunt     Cancer Other    Cancer Sister    Diabetes Sister     Current Medications[1]   Allergies[2]   Review of Systems  Constitutional:  Negative for fatigue and fever.  HENT:  Negative for congestion.   Respiratory:  Negative for cough.   Cardiovascular:  Positive for leg swelling (left lower extremity).  Gastrointestinal:  Negative for nausea and vomiting.  Musculoskeletal:  Negative for arthralgias.  Skin:  Positive for color change (left leg had redness).  Neurological:  Negative for headaches.  Psychiatric/Behavioral: Negative.       Today's Vitals   07/08/24 1021  BP: 124/60  Pulse: 72  Temp: 97.6 F (36.4 C)  TempSrc: Oral  Weight: 220 lb 6.4 oz (100 kg)  Height: 5' 3 (1.6 m)  PainSc: 0-No pain   Body mass index is 39.04 kg/m.  Wt Readings from Last 3 Encounters:  07/08/24 220 lb 6.4 oz (100 kg)  06/29/24 212 lb (96.2 kg)  06/10/24 220 lb (99.8 kg)    The ASCVD Risk score (Arnett DK, et al., 2019) failed to calculate for the following reasons:   The 2019 ASCVD risk score is only valid for ages 19 to 45   * - Cholesterol units were assumed  Objective:  Physical Exam Vitals  and nursing note reviewed.  Constitutional:      General: She is not in acute distress.    Appearance: Normal appearance. She is obese.  Cardiovascular:     Rate and Rhythm: Normal rate and regular rhythm.     Pulses: Normal pulses.     Heart sounds: Normal heart sounds. No murmur heard. Pulmonary:     Effort: Pulmonary effort is normal. No respiratory distress.     Breath sounds: Normal breath sounds. No wheezing.  Musculoskeletal:        General: Swelling (left lower extremity) and tenderness (left lower extremity) present.  Skin:    General: Skin is warm and dry.     Capillary Refill: Capillary refill takes less than 2 seconds.  Neurological:     General: No focal deficit present.     Mental Status: She is alert and oriented to person, place, and time.     Cranial Nerves: No cranial  nerve deficit.     Motor: No weakness.         Assessment And Plan:   Assessment & Plan Cellulitis of left lower extremity Seen in ER on 12/22 and treated with Rocephin  and cephalexin , has completed course of cephalexin . Doing better except continues to have swelling. Leg is not hot but is mildly tender. Encouraged to wear support socks during the day. 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 Advised to aim for at least 150 minutes of exercise per week.   No orders of the defined types were placed in this encounter.    Return for keep same next.  Patient was given opportunity to ask questions. Patient verbalized understanding of the plan and was able to repeat key elements of the plan. All questions were answered to their satisfaction.    LILLETTE Stephanie Ada, FNP, have reviewed all documentation for this visit. The documentation on 07/08/2024 for the exam, diagnosis, procedures, and orders are all accurate and complete.   IF YOU HAVE BEEN REFERRED TO A SPECIALIST, IT MAY TAKE 1-2 WEEKS TO SCHEDULE/PROCESS THE REFERRAL. IF YOU HAVE NOT HEARD FROM US /SPECIALIST IN TWO WEEKS, PLEASE GIVE US  A CALL AT [phone] X [phone].      [1]  Current Outpatient Medications:    aspirin EC 81 MG tablet, Take 81 mg by mouth daily. Swallow whole., Disp: , Rfl:    bimatoprost (LUMIGAN) 0.01 % SOLN, Place 1 drop into both eyes at bedtime. , Disp: , Rfl:    Blood Glucose Monitoring Suppl (ACCU-CHEK GUIDE ME) w/Device KIT, USE ONCE DAILY TO CHECK BLOOD SUGARS., Disp: 1 kit, Rfl: 1   Blood Glucose Monitoring Suppl (ONETOUCH VERIO FLEX SYSTEM) w/Device KIT, USE TO CHECK BLOOD SUGAR 3  TIMES DAILY, Disp: 1 kit, Rfl: 1   CRESTOR  20 MG tablet, Take 1 tablet (20 mg total) by mouth at bedtime.,  Disp: , Rfl:    famotidine (PEPCID) 20 MG tablet, Take 20 mg by mouth daily with breakfast., Disp: , Rfl:    furosemide  (LASIX ) 20 MG tablet, Take 1 tablet (20 mg total) by mouth every morning., Disp: 90 tablet, Rfl: 3   glucose blood (ONETOUCH VERIO) test strip, USE AS INSTRUCTED TO CHECK  BLOOD SUGAR 3 TIMES DAILY, Disp: 300 strip, Rfl: 3  hydroquinone 4 % cream, Apply topically daily., Disp: , Rfl:    Lancets (ONETOUCH DELICA PLUS LANCET33G) MISC, USE LANCETS ONCE DAILY TO CHECK BLOOD SUGARS., Disp: 300 each, Rfl: 3   Lifitegrast (XIIDRA) 5 % SOLN, Place 1 drop into both eyes daily. xiidra, Disp: , Rfl:    metFORMIN  (GLUCOPHAGE -XR) 500 MG 24 hr tablet, Take 1 tablet in the morning and two tablets in the evening., Disp: 270 tablet, Rfl: 3   Multiple Vitamins-Minerals (MULTIVITAMIN WITH MINERALS) tablet, Take 1 tablet by mouth daily. Centrum Silver, Disp: , Rfl:    olmesartan  (BENICAR ) 40 MG tablet, Take 1 tablet (40 mg total) by mouth daily., Disp: , Rfl:    polyethylene glycol (MIRALAX  / GLYCOLAX ) packet, Take 17 g by mouth daily., Disp: , Rfl:    SYNTHROID  50 MCG tablet, Take 1 tablet (50 mcg total) by mouth daily., Disp: , Rfl:    vitamin B-6 (PYRIDOXINE) 25 MG tablet, Take 25 mg by mouth daily., Disp: , Rfl:    Carboxymethylcellulose Sodium (REFRESH LIQUIGEL) 1 % GEL, Place 1 drop into both eyes at bedtime. (Patient not taking: Reported on 07/08/2024), Disp: , Rfl:    magnesium  oxide (MAG-OX) 400 (240 Mg) MG tablet, TAKE 1 TABLET BY MOUTH EVERY DAY AFTER SUPPER (Patient not taking: Reported on 07/08/2024), Disp: 90 tablet, Rfl: 1   nystatin  powder, Apply 1 Application topically 3 (three) times daily. (Patient not taking: Reported on 07/08/2024), Disp: 45 g, Rfl: 0   triamcinolone  cream (KENALOG ) 0.1 %, APPLY TO AFFECTED AREA TWICE DAILY AS NEEDED (Patient not taking: Reported on 07/08/2024), Disp: 45 g, Rfl: 0 [2]  Allergies Allergen Reactions   Codeine Nausea And Vomiting   Tramadol  Nausea And Vomiting   Macrobid [Nitrofurantoin Macrocrystal] Rash

## 2024-07-10 ENCOUNTER — Telehealth: Payer: Self-pay

## 2024-07-10 NOTE — Telephone Encounter (Signed)
 I called and left pt vm to call the office. I was calling her to check on her and to see how her leg is doing. YL,RMA

## 2024-10-26 ENCOUNTER — Encounter: Admitting: Internal Medicine

## 2024-10-29 ENCOUNTER — Encounter: Payer: Self-pay | Admitting: Internal Medicine

## 2025-06-23 ENCOUNTER — Ambulatory Visit: Payer: Self-pay | Admitting: Internal Medicine

## 2025-06-23 ENCOUNTER — Ambulatory Visit

## 2025-06-23 ENCOUNTER — Ambulatory Visit: Admitting: Internal Medicine
